# Patient Record
Sex: Female | Born: 1945 | Race: Black or African American | Hispanic: No | Marital: Single | Smoking: Never smoker
Health system: Southern US, Community
[De-identification: ages and names within clinical notes are randomized; demographics above are authoritative.]

## PROBLEM LIST (undated history)

## (undated) ENCOUNTER — Emergency Department (HOSPITAL_COMMUNITY): Admission: EM | Payer: Medicare HMO | Source: Home / Self Care

## (undated) DIAGNOSIS — E785 Hyperlipidemia, unspecified: Secondary | ICD-10-CM

## (undated) DIAGNOSIS — I10 Essential (primary) hypertension: Secondary | ICD-10-CM

## (undated) DIAGNOSIS — G4733 Obstructive sleep apnea (adult) (pediatric): Secondary | ICD-10-CM

## (undated) DIAGNOSIS — K219 Gastro-esophageal reflux disease without esophagitis: Secondary | ICD-10-CM

## (undated) DIAGNOSIS — E119 Type 2 diabetes mellitus without complications: Secondary | ICD-10-CM

## (undated) DIAGNOSIS — G4730 Sleep apnea, unspecified: Secondary | ICD-10-CM

## (undated) HISTORY — DX: Essential (primary) hypertension: I10

## (undated) HISTORY — PX: CHOLECYSTECTOMY: SHX55

## (undated) HISTORY — PX: APPENDECTOMY: SHX54

## (undated) HISTORY — DX: Sleep apnea, unspecified: G47.30

## (undated) HISTORY — DX: Type 2 diabetes mellitus without complications: E11.9

## (undated) HISTORY — PX: CATARACT EXTRACTION: SUR2

## (undated) HISTORY — PX: POLYPECTOMY: SHX149

## (undated) HISTORY — DX: Gastro-esophageal reflux disease without esophagitis: K21.9

## (undated) HISTORY — DX: Obstructive sleep apnea (adult) (pediatric): G47.33

## (undated) HISTORY — PX: BREAST EXCISIONAL BIOPSY: SUR124

## (undated) HISTORY — DX: Hyperlipidemia, unspecified: E78.5

---

## 1989-04-30 HISTORY — PX: PARTIAL HYSTERECTOMY: SHX80

## 1997-11-29 ENCOUNTER — Ambulatory Visit (HOSPITAL_COMMUNITY): Admission: RE | Admit: 1997-11-29 | Discharge: 1997-11-29 | Payer: Self-pay | Admitting: Obstetrics & Gynecology

## 1998-09-16 ENCOUNTER — Other Ambulatory Visit: Admission: RE | Admit: 1998-09-16 | Discharge: 1998-09-16 | Payer: Self-pay | Admitting: Obstetrics & Gynecology

## 1999-01-06 ENCOUNTER — Ambulatory Visit (HOSPITAL_COMMUNITY): Admission: RE | Admit: 1999-01-06 | Discharge: 1999-01-06 | Payer: Self-pay | Admitting: Obstetrics & Gynecology

## 1999-01-06 ENCOUNTER — Encounter: Payer: Self-pay | Admitting: Obstetrics & Gynecology

## 1999-08-28 ENCOUNTER — Encounter: Payer: Self-pay | Admitting: Emergency Medicine

## 1999-08-28 ENCOUNTER — Encounter: Admission: RE | Admit: 1999-08-28 | Discharge: 1999-08-28 | Payer: Self-pay | Admitting: Emergency Medicine

## 1999-12-01 ENCOUNTER — Ambulatory Visit (HOSPITAL_COMMUNITY): Admission: RE | Admit: 1999-12-01 | Discharge: 1999-12-02 | Payer: Self-pay

## 1999-12-01 ENCOUNTER — Encounter (INDEPENDENT_AMBULATORY_CARE_PROVIDER_SITE_OTHER): Payer: Self-pay | Admitting: Specialist

## 2000-01-15 ENCOUNTER — Encounter: Payer: Self-pay | Admitting: Obstetrics and Gynecology

## 2000-01-15 ENCOUNTER — Ambulatory Visit (HOSPITAL_COMMUNITY): Admission: RE | Admit: 2000-01-15 | Discharge: 2000-01-15 | Payer: Self-pay | Admitting: Obstetrics and Gynecology

## 2001-01-14 ENCOUNTER — Ambulatory Visit (HOSPITAL_COMMUNITY): Admission: RE | Admit: 2001-01-14 | Discharge: 2001-01-14 | Payer: Self-pay | Admitting: Obstetrics and Gynecology

## 2001-01-14 ENCOUNTER — Encounter: Payer: Self-pay | Admitting: Obstetrics and Gynecology

## 2002-03-19 ENCOUNTER — Encounter: Payer: Self-pay | Admitting: Obstetrics and Gynecology

## 2002-03-19 ENCOUNTER — Ambulatory Visit (HOSPITAL_COMMUNITY): Admission: RE | Admit: 2002-03-19 | Discharge: 2002-03-19 | Payer: Self-pay | Admitting: Obstetrics and Gynecology

## 2002-12-29 ENCOUNTER — Encounter: Payer: Self-pay | Admitting: Obstetrics and Gynecology

## 2002-12-29 ENCOUNTER — Ambulatory Visit (HOSPITAL_COMMUNITY): Admission: RE | Admit: 2002-12-29 | Discharge: 2002-12-29 | Payer: Self-pay | Admitting: Obstetrics and Gynecology

## 2004-01-05 ENCOUNTER — Ambulatory Visit: Payer: Self-pay | Admitting: *Deleted

## 2004-01-05 ENCOUNTER — Ambulatory Visit (HOSPITAL_COMMUNITY): Admission: RE | Admit: 2004-01-05 | Discharge: 2004-01-05 | Payer: Self-pay | Admitting: Obstetrics and Gynecology

## 2004-02-08 ENCOUNTER — Ambulatory Visit: Payer: Self-pay | Admitting: Nurse Practitioner

## 2004-02-28 ENCOUNTER — Ambulatory Visit: Payer: Self-pay | Admitting: Nurse Practitioner

## 2004-05-17 ENCOUNTER — Ambulatory Visit: Payer: Self-pay | Admitting: Family Medicine

## 2004-06-07 ENCOUNTER — Ambulatory Visit: Payer: Self-pay | Admitting: Nurse Practitioner

## 2004-08-21 ENCOUNTER — Ambulatory Visit: Payer: Self-pay | Admitting: Internal Medicine

## 2004-10-16 ENCOUNTER — Ambulatory Visit: Payer: Self-pay | Admitting: Internal Medicine

## 2004-11-15 ENCOUNTER — Ambulatory Visit: Payer: Self-pay | Admitting: Internal Medicine

## 2005-02-08 ENCOUNTER — Ambulatory Visit (HOSPITAL_COMMUNITY): Admission: RE | Admit: 2005-02-08 | Discharge: 2005-02-08 | Payer: Self-pay | Admitting: Obstetrics and Gynecology

## 2005-02-28 ENCOUNTER — Ambulatory Visit: Payer: Self-pay | Admitting: Internal Medicine

## 2005-03-07 ENCOUNTER — Ambulatory Visit: Payer: Self-pay | Admitting: Internal Medicine

## 2005-05-02 ENCOUNTER — Ambulatory Visit: Payer: Self-pay | Admitting: Family Medicine

## 2005-05-06 ENCOUNTER — Encounter (INDEPENDENT_AMBULATORY_CARE_PROVIDER_SITE_OTHER): Payer: Self-pay | Admitting: *Deleted

## 2005-05-18 ENCOUNTER — Ambulatory Visit: Payer: Self-pay | Admitting: Family Medicine

## 2005-07-12 ENCOUNTER — Ambulatory Visit: Payer: Self-pay | Admitting: Family Medicine

## 2005-10-01 ENCOUNTER — Ambulatory Visit: Payer: Self-pay | Admitting: Family Medicine

## 2005-12-21 ENCOUNTER — Ambulatory Visit: Payer: Self-pay | Admitting: Internal Medicine

## 2006-01-04 ENCOUNTER — Ambulatory Visit: Payer: Self-pay | Admitting: Internal Medicine

## 2006-01-18 ENCOUNTER — Ambulatory Visit: Payer: Self-pay | Admitting: Internal Medicine

## 2006-03-13 ENCOUNTER — Ambulatory Visit (HOSPITAL_COMMUNITY): Admission: RE | Admit: 2006-03-13 | Discharge: 2006-03-13 | Payer: Self-pay | Admitting: Internal Medicine

## 2006-05-28 ENCOUNTER — Ambulatory Visit: Payer: Self-pay | Admitting: Internal Medicine

## 2006-06-27 DIAGNOSIS — F411 Generalized anxiety disorder: Secondary | ICD-10-CM

## 2006-06-27 DIAGNOSIS — M6740 Ganglion, unspecified site: Secondary | ICD-10-CM | POA: Insufficient documentation

## 2006-06-27 DIAGNOSIS — I10 Essential (primary) hypertension: Secondary | ICD-10-CM

## 2006-06-27 DIAGNOSIS — F339 Major depressive disorder, recurrent, unspecified: Secondary | ICD-10-CM | POA: Insufficient documentation

## 2006-06-28 ENCOUNTER — Encounter (INDEPENDENT_AMBULATORY_CARE_PROVIDER_SITE_OTHER): Payer: Self-pay | Admitting: *Deleted

## 2006-06-28 ENCOUNTER — Ambulatory Visit: Payer: Self-pay | Admitting: Internal Medicine

## 2006-10-10 ENCOUNTER — Ambulatory Visit: Payer: Self-pay | Admitting: Internal Medicine

## 2006-10-22 ENCOUNTER — Ambulatory Visit: Payer: Self-pay | Admitting: Internal Medicine

## 2006-10-22 DIAGNOSIS — R634 Abnormal weight loss: Secondary | ICD-10-CM

## 2006-11-06 ENCOUNTER — Ambulatory Visit: Payer: Self-pay | Admitting: Internal Medicine

## 2006-11-06 LAB — CONVERTED CEMR LAB
Glucose, Bld: 140 mg/dL
Hgb A1c MFr Bld: 7.2 %

## 2006-11-15 ENCOUNTER — Ambulatory Visit: Payer: Self-pay | Admitting: Internal Medicine

## 2006-11-15 DIAGNOSIS — E119 Type 2 diabetes mellitus without complications: Secondary | ICD-10-CM | POA: Insufficient documentation

## 2006-11-28 ENCOUNTER — Ambulatory Visit: Payer: Self-pay | Admitting: Internal Medicine

## 2006-12-04 ENCOUNTER — Ambulatory Visit: Payer: Self-pay | Admitting: Internal Medicine

## 2006-12-10 ENCOUNTER — Ambulatory Visit: Payer: Self-pay | Admitting: Family Medicine

## 2006-12-23 ENCOUNTER — Encounter (INDEPENDENT_AMBULATORY_CARE_PROVIDER_SITE_OTHER): Payer: Self-pay | Admitting: Internal Medicine

## 2006-12-27 ENCOUNTER — Ambulatory Visit: Payer: Self-pay | Admitting: Internal Medicine

## 2006-12-27 DIAGNOSIS — K5909 Other constipation: Secondary | ICD-10-CM

## 2006-12-27 DIAGNOSIS — N951 Menopausal and female climacteric states: Secondary | ICD-10-CM

## 2006-12-27 DIAGNOSIS — N3946 Mixed incontinence: Secondary | ICD-10-CM | POA: Insufficient documentation

## 2006-12-27 DIAGNOSIS — J309 Allergic rhinitis, unspecified: Secondary | ICD-10-CM | POA: Insufficient documentation

## 2006-12-27 LAB — CONVERTED CEMR LAB
Bilirubin Urine: NEGATIVE
Blood Glucose, Fingerstick: 79
Blood in Urine, dipstick: NEGATIVE
Glucose, Urine, Semiquant: NEGATIVE
Ketones, urine, test strip: NEGATIVE
Nitrite: NEGATIVE
Protein, U semiquant: NEGATIVE
Specific Gravity, Urine: <1.005
Urobilinogen, UA: NEGATIVE
WBC Urine, dipstick: NEGATIVE
pH: 6

## 2007-01-07 ENCOUNTER — Telehealth (INDEPENDENT_AMBULATORY_CARE_PROVIDER_SITE_OTHER): Payer: Self-pay | Admitting: Internal Medicine

## 2007-01-15 ENCOUNTER — Encounter (INDEPENDENT_AMBULATORY_CARE_PROVIDER_SITE_OTHER): Payer: Self-pay | Admitting: *Deleted

## 2007-01-21 ENCOUNTER — Telehealth (INDEPENDENT_AMBULATORY_CARE_PROVIDER_SITE_OTHER): Payer: Self-pay | Admitting: Internal Medicine

## 2007-01-31 ENCOUNTER — Telehealth (INDEPENDENT_AMBULATORY_CARE_PROVIDER_SITE_OTHER): Payer: Self-pay | Admitting: Internal Medicine

## 2007-02-19 ENCOUNTER — Telehealth (INDEPENDENT_AMBULATORY_CARE_PROVIDER_SITE_OTHER): Payer: Self-pay | Admitting: *Deleted

## 2007-02-28 ENCOUNTER — Ambulatory Visit (HOSPITAL_COMMUNITY): Admission: RE | Admit: 2007-02-28 | Discharge: 2007-02-28 | Payer: Self-pay | Admitting: Internal Medicine

## 2007-03-06 ENCOUNTER — Ambulatory Visit: Payer: Self-pay | Admitting: Internal Medicine

## 2007-03-06 DIAGNOSIS — E7800 Pure hypercholesterolemia, unspecified: Secondary | ICD-10-CM

## 2007-03-13 ENCOUNTER — Ambulatory Visit: Payer: Self-pay | Admitting: Internal Medicine

## 2007-03-13 LAB — CONVERTED CEMR LAB
Alkaline Phosphatase: 52 units/L (ref 39–117)
BUN: 19 mg/dL (ref 6–23)
Creatinine, Ser: 0.85 mg/dL (ref 0.40–1.20)
Eosinophils Absolute: 0 10*3/uL (ref 0.0–0.7)
Eosinophils Relative: 1 % (ref 0–5)
Glucose, Bld: 58 mg/dL — ABNORMAL LOW (ref 70–99)
HCT: 38.1 % (ref 36.0–46.0)
HDL: 76 mg/dL (ref >39)
LDL Cholesterol: 213 mg/dL — ABNORMAL HIGH (ref 0–99)
Lymphs Abs: 2.6 10*3/uL (ref 0.7–3.3)
MCV: 93.2 fL (ref 78.0–100.0)
Monocytes Absolute: 0.4 10*3/uL (ref 0.2–0.7)
Monocytes Relative: 8 % (ref 3–11)
Platelets: 174 10*3/uL (ref 150–400)
Sodium: 139 meq/L (ref 135–145)
Total Bilirubin: 0.6 mg/dL (ref 0.3–1.2)
Total CHOL/HDL Ratio: 4
Triglycerides: 92 mg/dL (ref <150)
VLDL: 18 mg/dL (ref 0–40)
WBC: 5.3 10*3/uL (ref 4.0–10.5)

## 2007-03-19 ENCOUNTER — Encounter (INDEPENDENT_AMBULATORY_CARE_PROVIDER_SITE_OTHER): Payer: Self-pay | Admitting: Internal Medicine

## 2007-03-19 LAB — CONVERTED CEMR LAB: IgM, Serum: 95 mg/dL (ref 60–263)

## 2007-04-08 ENCOUNTER — Telehealth (INDEPENDENT_AMBULATORY_CARE_PROVIDER_SITE_OTHER): Payer: Self-pay | Admitting: *Deleted

## 2007-04-09 ENCOUNTER — Encounter (INDEPENDENT_AMBULATORY_CARE_PROVIDER_SITE_OTHER): Payer: Self-pay | Admitting: Internal Medicine

## 2007-04-09 ENCOUNTER — Ambulatory Visit: Payer: Self-pay | Admitting: Gastroenterology

## 2007-04-15 ENCOUNTER — Ambulatory Visit: Payer: Self-pay | Admitting: Internal Medicine

## 2007-04-15 ENCOUNTER — Telehealth (INDEPENDENT_AMBULATORY_CARE_PROVIDER_SITE_OTHER): Payer: Self-pay | Admitting: Internal Medicine

## 2007-04-17 ENCOUNTER — Encounter (INDEPENDENT_AMBULATORY_CARE_PROVIDER_SITE_OTHER): Payer: Self-pay | Admitting: Internal Medicine

## 2007-04-23 LAB — CONVERTED CEMR LAB
Cholesterol: 166 mg/dL (ref 0–200)
Total CHOL/HDL Ratio: 2.3
Triglycerides: 46 mg/dL (ref <150)
VLDL: 9 mg/dL (ref 0–40)

## 2007-05-06 ENCOUNTER — Ambulatory Visit: Payer: Self-pay | Admitting: Internal Medicine

## 2007-05-06 DIAGNOSIS — R5383 Other fatigue: Secondary | ICD-10-CM | POA: Insufficient documentation

## 2007-05-06 DIAGNOSIS — R5381 Other malaise: Secondary | ICD-10-CM

## 2007-05-06 LAB — CONVERTED CEMR LAB: Blood Glucose, Fingerstick: 75

## 2007-05-09 ENCOUNTER — Encounter: Payer: Self-pay | Admitting: Gastroenterology

## 2007-05-09 ENCOUNTER — Ambulatory Visit: Payer: Self-pay | Admitting: Gastroenterology

## 2007-05-09 ENCOUNTER — Encounter (INDEPENDENT_AMBULATORY_CARE_PROVIDER_SITE_OTHER): Payer: Self-pay | Admitting: Internal Medicine

## 2007-05-25 ENCOUNTER — Encounter (INDEPENDENT_AMBULATORY_CARE_PROVIDER_SITE_OTHER): Payer: Self-pay | Admitting: Internal Medicine

## 2007-06-02 ENCOUNTER — Ambulatory Visit: Payer: Self-pay | Admitting: Gastroenterology

## 2007-06-02 ENCOUNTER — Encounter (INDEPENDENT_AMBULATORY_CARE_PROVIDER_SITE_OTHER): Payer: Self-pay | Admitting: Internal Medicine

## 2007-06-03 ENCOUNTER — Telehealth (INDEPENDENT_AMBULATORY_CARE_PROVIDER_SITE_OTHER): Payer: Self-pay | Admitting: Internal Medicine

## 2007-06-17 ENCOUNTER — Encounter: Admission: RE | Admit: 2007-06-17 | Discharge: 2007-09-15 | Payer: Self-pay | Admitting: Gastroenterology

## 2007-08-28 ENCOUNTER — Ambulatory Visit: Payer: Self-pay | Admitting: Internal Medicine

## 2007-09-01 ENCOUNTER — Ambulatory Visit: Payer: Self-pay | Admitting: Gastroenterology

## 2007-09-01 DIAGNOSIS — Z860100 Personal history of colon polyps, unspecified: Secondary | ICD-10-CM | POA: Insufficient documentation

## 2007-09-01 DIAGNOSIS — K219 Gastro-esophageal reflux disease without esophagitis: Secondary | ICD-10-CM | POA: Insufficient documentation

## 2007-09-02 ENCOUNTER — Telehealth: Payer: Self-pay | Admitting: Gastroenterology

## 2007-09-03 ENCOUNTER — Encounter: Admission: RE | Admit: 2007-09-03 | Discharge: 2007-12-02 | Payer: Self-pay | Admitting: Gastroenterology

## 2007-09-15 ENCOUNTER — Ambulatory Visit: Payer: Self-pay | Admitting: Pulmonary Disease

## 2007-10-07 ENCOUNTER — Ambulatory Visit: Payer: Self-pay | Admitting: Pulmonary Disease

## 2007-11-18 ENCOUNTER — Ambulatory Visit: Payer: Self-pay | Admitting: Internal Medicine

## 2007-11-25 ENCOUNTER — Telehealth (INDEPENDENT_AMBULATORY_CARE_PROVIDER_SITE_OTHER): Payer: Self-pay | Admitting: *Deleted

## 2007-12-09 ENCOUNTER — Encounter (INDEPENDENT_AMBULATORY_CARE_PROVIDER_SITE_OTHER): Payer: Self-pay | Admitting: *Deleted

## 2007-12-12 ENCOUNTER — Telehealth (INDEPENDENT_AMBULATORY_CARE_PROVIDER_SITE_OTHER): Payer: Self-pay | Admitting: Internal Medicine

## 2007-12-24 ENCOUNTER — Encounter: Admission: RE | Admit: 2007-12-24 | Discharge: 2007-12-24 | Payer: Self-pay | Admitting: Gastroenterology

## 2008-01-15 ENCOUNTER — Ambulatory Visit: Payer: Self-pay | Admitting: Internal Medicine

## 2008-01-15 LAB — CONVERTED CEMR LAB
ALT: 35 units/L (ref 0–35)
AST: 31 units/L (ref 0–37)
Albumin: 5 g/dL (ref 3.5–5.2)
Alkaline Phosphatase: 71 units/L (ref 39–117)
Basophils Absolute: 0 10*3/uL (ref 0.0–0.1)
Basophils Relative: 1 % (ref 0–1)
Blood Glucose, Fingerstick: 91
Chlamydia, DNA Probe: NEGATIVE
Eosinophils Absolute: 0 10*3/uL (ref 0.0–0.7)
Eosinophils Relative: 1 % (ref 0–5)
Glucose, Bld: 74 mg/dL (ref 70–99)
Glucose, Urine, Semiquant: NEGATIVE
HCT: 40 % (ref 36.0–46.0)
Hgb A1c MFr Bld: 6.7 %
Ketones, urine, test strip: NEGATIVE
LDL Cholesterol: 93 mg/dL (ref 0–99)
Lymphs Abs: 2.7 10*3/uL (ref 0.7–4.0)
MCV: 93.2 fL (ref 78.0–100.0)
Microalb, Ur: 0.33 mg/dL (ref 0.00–1.89)
Neutrophils Relative %: 45 % (ref 43–77)
Platelets: 165 10*3/uL (ref 150–400)
Potassium: 3.6 meq/L (ref 3.5–5.3)
RDW: 12.7 % (ref 11.5–15.5)
Sodium: 137 meq/L (ref 135–145)
Specific Gravity, Urine: <1.005
Total Bilirubin: 0.5 mg/dL (ref 0.3–1.2)
Total Protein: 8.6 g/dL — ABNORMAL HIGH (ref 6.0–8.3)
Triglycerides: 46 mg/dL (ref <150)
VLDL: 9 mg/dL (ref 0–40)
WBC: 5.8 10*3/uL (ref 4.0–10.5)
pH: 5

## 2008-01-21 ENCOUNTER — Telehealth (INDEPENDENT_AMBULATORY_CARE_PROVIDER_SITE_OTHER): Payer: Self-pay | Admitting: Internal Medicine

## 2008-01-22 ENCOUNTER — Encounter (INDEPENDENT_AMBULATORY_CARE_PROVIDER_SITE_OTHER): Payer: Self-pay | Admitting: Internal Medicine

## 2008-02-11 ENCOUNTER — Telehealth (INDEPENDENT_AMBULATORY_CARE_PROVIDER_SITE_OTHER): Payer: Self-pay | Admitting: Internal Medicine

## 2008-02-11 ENCOUNTER — Encounter (INDEPENDENT_AMBULATORY_CARE_PROVIDER_SITE_OTHER): Payer: Self-pay | Admitting: Internal Medicine

## 2008-03-03 ENCOUNTER — Ambulatory Visit (HOSPITAL_COMMUNITY): Admission: RE | Admit: 2008-03-03 | Discharge: 2008-03-03 | Payer: Self-pay | Admitting: Internal Medicine

## 2008-03-17 ENCOUNTER — Encounter: Admission: RE | Admit: 2008-03-17 | Discharge: 2008-03-17 | Payer: Self-pay | Admitting: Gastroenterology

## 2008-03-29 ENCOUNTER — Ambulatory Visit: Payer: Self-pay | Admitting: Internal Medicine

## 2008-03-29 ENCOUNTER — Encounter (INDEPENDENT_AMBULATORY_CARE_PROVIDER_SITE_OTHER): Payer: Self-pay | Admitting: Internal Medicine

## 2008-04-12 ENCOUNTER — Telehealth (INDEPENDENT_AMBULATORY_CARE_PROVIDER_SITE_OTHER): Payer: Self-pay | Admitting: Internal Medicine

## 2008-04-15 ENCOUNTER — Telehealth (INDEPENDENT_AMBULATORY_CARE_PROVIDER_SITE_OTHER): Payer: Self-pay | Admitting: Internal Medicine

## 2008-04-21 ENCOUNTER — Ambulatory Visit: Payer: Self-pay | Admitting: Internal Medicine

## 2008-04-21 LAB — CONVERTED CEMR LAB
Bilirubin Urine: NEGATIVE
Glucose, Urine, Semiquant: NEGATIVE
Protein, U semiquant: NEGATIVE
Specific Gravity, Urine: <1.005
pH: 5

## 2008-04-22 ENCOUNTER — Encounter (INDEPENDENT_AMBULATORY_CARE_PROVIDER_SITE_OTHER): Payer: Self-pay | Admitting: Internal Medicine

## 2008-04-28 ENCOUNTER — Ambulatory Visit: Payer: Self-pay | Admitting: Internal Medicine

## 2008-05-03 ENCOUNTER — Encounter (INDEPENDENT_AMBULATORY_CARE_PROVIDER_SITE_OTHER): Payer: Self-pay | Admitting: Internal Medicine

## 2008-05-21 ENCOUNTER — Telehealth: Payer: Self-pay | Admitting: Gastroenterology

## 2008-05-21 ENCOUNTER — Encounter: Payer: Self-pay | Admitting: Gastroenterology

## 2008-05-23 ENCOUNTER — Encounter (INDEPENDENT_AMBULATORY_CARE_PROVIDER_SITE_OTHER): Payer: Self-pay | Admitting: Internal Medicine

## 2008-05-23 LAB — CONVERTED CEMR LAB
HDL: 79 mg/dL (ref >39)
LDL Cholesterol: 91 mg/dL (ref 0–99)
Triglycerides: 52 mg/dL (ref <150)

## 2008-06-14 ENCOUNTER — Telehealth (INDEPENDENT_AMBULATORY_CARE_PROVIDER_SITE_OTHER): Payer: Self-pay | Admitting: Internal Medicine

## 2008-06-14 ENCOUNTER — Encounter (INDEPENDENT_AMBULATORY_CARE_PROVIDER_SITE_OTHER): Payer: Self-pay | Admitting: Internal Medicine

## 2008-06-17 ENCOUNTER — Telehealth: Payer: Self-pay | Admitting: Gastroenterology

## 2008-06-30 ENCOUNTER — Encounter: Admission: RE | Admit: 2008-06-30 | Discharge: 2008-06-30 | Payer: Self-pay | Admitting: Gastroenterology

## 2008-07-07 ENCOUNTER — Ambulatory Visit: Payer: Self-pay | Admitting: Internal Medicine

## 2008-07-07 LAB — CONVERTED CEMR LAB
ALT: 17 units/L (ref 0–35)
AST: 19 units/L (ref 0–37)
Albumin: 4.4 g/dL (ref 3.5–5.2)
HDL: 70 mg/dL (ref >39)
Total Bilirubin: 0.5 mg/dL (ref 0.3–1.2)
Total CHOL/HDL Ratio: 2.1
Total CK: 60 units/L (ref 7–177)
VLDL: 7 mg/dL (ref 0–40)

## 2008-07-09 ENCOUNTER — Encounter (INDEPENDENT_AMBULATORY_CARE_PROVIDER_SITE_OTHER): Payer: Self-pay | Admitting: *Deleted

## 2008-07-22 ENCOUNTER — Telehealth: Payer: Self-pay | Admitting: Gastroenterology

## 2008-07-26 ENCOUNTER — Encounter (INDEPENDENT_AMBULATORY_CARE_PROVIDER_SITE_OTHER): Payer: Self-pay | Admitting: Internal Medicine

## 2008-08-02 ENCOUNTER — Encounter (INDEPENDENT_AMBULATORY_CARE_PROVIDER_SITE_OTHER): Payer: Self-pay | Admitting: Internal Medicine

## 2008-08-23 ENCOUNTER — Ambulatory Visit: Payer: Self-pay | Admitting: Internal Medicine

## 2008-08-23 ENCOUNTER — Encounter (INDEPENDENT_AMBULATORY_CARE_PROVIDER_SITE_OTHER): Payer: Self-pay | Admitting: Internal Medicine

## 2008-09-02 ENCOUNTER — Telehealth (INDEPENDENT_AMBULATORY_CARE_PROVIDER_SITE_OTHER): Payer: Self-pay | Admitting: Internal Medicine

## 2008-09-06 ENCOUNTER — Ambulatory Visit: Payer: Self-pay | Admitting: Gastroenterology

## 2008-10-05 ENCOUNTER — Ambulatory Visit: Payer: Self-pay | Admitting: Internal Medicine

## 2008-10-05 LAB — CONVERTED CEMR LAB: Blood Glucose, Fingerstick: 65

## 2008-10-11 ENCOUNTER — Telehealth (INDEPENDENT_AMBULATORY_CARE_PROVIDER_SITE_OTHER): Payer: Self-pay | Admitting: Internal Medicine

## 2008-10-11 ENCOUNTER — Ambulatory Visit (HOSPITAL_COMMUNITY): Admission: RE | Admit: 2008-10-11 | Discharge: 2008-10-11 | Payer: Self-pay | Admitting: Internal Medicine

## 2008-10-12 ENCOUNTER — Encounter: Admission: RE | Admit: 2008-10-12 | Discharge: 2008-10-12 | Payer: Self-pay | Admitting: Gastroenterology

## 2008-10-13 ENCOUNTER — Telehealth: Payer: Self-pay | Admitting: Gastroenterology

## 2008-10-19 ENCOUNTER — Telehealth: Payer: Self-pay | Admitting: Gastroenterology

## 2008-11-15 ENCOUNTER — Ambulatory Visit: Payer: Self-pay | Admitting: Gastroenterology

## 2008-11-17 ENCOUNTER — Telehealth: Payer: Self-pay | Admitting: Gastroenterology

## 2008-11-22 ENCOUNTER — Telehealth (INDEPENDENT_AMBULATORY_CARE_PROVIDER_SITE_OTHER): Payer: Self-pay | Admitting: *Deleted

## 2009-01-07 ENCOUNTER — Ambulatory Visit: Payer: Self-pay | Admitting: Internal Medicine

## 2009-01-07 DIAGNOSIS — R0989 Other specified symptoms and signs involving the circulatory and respiratory systems: Secondary | ICD-10-CM

## 2009-01-07 LAB — CONVERTED CEMR LAB
BUN: 20 mg/dL (ref 6–23)
Calcium: 10 mg/dL (ref 8.4–10.5)
Glucose, Bld: 86 mg/dL (ref 70–99)
Microalb, Ur: 0.69 mg/dL (ref 0.00–1.89)
Sodium: 140 meq/L (ref 135–145)

## 2009-01-11 ENCOUNTER — Encounter (INDEPENDENT_AMBULATORY_CARE_PROVIDER_SITE_OTHER): Payer: Self-pay | Admitting: Internal Medicine

## 2009-01-12 ENCOUNTER — Encounter: Admission: RE | Admit: 2009-01-12 | Discharge: 2009-01-12 | Payer: Self-pay | Admitting: Gastroenterology

## 2009-01-18 ENCOUNTER — Encounter (INDEPENDENT_AMBULATORY_CARE_PROVIDER_SITE_OTHER): Payer: Self-pay | Admitting: Internal Medicine

## 2009-01-18 ENCOUNTER — Ambulatory Visit: Payer: Self-pay | Admitting: Surgery

## 2009-01-18 ENCOUNTER — Ambulatory Visit (HOSPITAL_COMMUNITY): Admission: RE | Admit: 2009-01-18 | Discharge: 2009-01-18 | Payer: Self-pay | Admitting: Internal Medicine

## 2009-01-23 ENCOUNTER — Encounter (INDEPENDENT_AMBULATORY_CARE_PROVIDER_SITE_OTHER): Payer: Self-pay | Admitting: Internal Medicine

## 2009-01-24 ENCOUNTER — Ambulatory Visit: Payer: Self-pay | Admitting: Family Medicine

## 2009-01-24 ENCOUNTER — Encounter (INDEPENDENT_AMBULATORY_CARE_PROVIDER_SITE_OTHER): Payer: Self-pay | Admitting: Internal Medicine

## 2009-01-28 ENCOUNTER — Telehealth: Payer: Self-pay | Admitting: Gastroenterology

## 2009-02-02 ENCOUNTER — Ambulatory Visit: Payer: Self-pay | Admitting: Gastroenterology

## 2009-02-04 ENCOUNTER — Telehealth: Payer: Self-pay | Admitting: Gastroenterology

## 2009-02-07 ENCOUNTER — Telehealth: Payer: Self-pay | Admitting: Gastroenterology

## 2009-02-08 ENCOUNTER — Telehealth: Payer: Self-pay | Admitting: Gastroenterology

## 2009-02-09 ENCOUNTER — Encounter: Payer: Self-pay | Admitting: Gastroenterology

## 2009-02-09 ENCOUNTER — Ambulatory Visit (HOSPITAL_COMMUNITY): Admission: RE | Admit: 2009-02-09 | Discharge: 2009-02-09 | Payer: Self-pay | Admitting: Gastroenterology

## 2009-02-11 ENCOUNTER — Telehealth: Payer: Self-pay | Admitting: Gastroenterology

## 2009-02-15 ENCOUNTER — Encounter (INDEPENDENT_AMBULATORY_CARE_PROVIDER_SITE_OTHER): Payer: Self-pay | Admitting: Internal Medicine

## 2009-02-22 ENCOUNTER — Ambulatory Visit: Payer: Self-pay | Admitting: Gastroenterology

## 2009-02-28 ENCOUNTER — Ambulatory Visit: Payer: Self-pay | Admitting: Internal Medicine

## 2009-03-11 ENCOUNTER — Ambulatory Visit (HOSPITAL_COMMUNITY): Admission: RE | Admit: 2009-03-11 | Discharge: 2009-03-11 | Payer: Self-pay | Admitting: Obstetrics and Gynecology

## 2009-03-11 ENCOUNTER — Encounter (INDEPENDENT_AMBULATORY_CARE_PROVIDER_SITE_OTHER): Payer: Self-pay | Admitting: Internal Medicine

## 2009-03-22 ENCOUNTER — Encounter (INDEPENDENT_AMBULATORY_CARE_PROVIDER_SITE_OTHER): Payer: Self-pay | Admitting: Internal Medicine

## 2009-03-31 ENCOUNTER — Encounter: Admission: RE | Admit: 2009-03-31 | Discharge: 2009-03-31 | Payer: Self-pay | Admitting: Obstetrics and Gynecology

## 2009-04-30 HISTORY — PX: COLONOSCOPY: SHX174

## 2009-06-27 ENCOUNTER — Ambulatory Visit: Payer: Self-pay | Admitting: Family Medicine

## 2009-06-27 ENCOUNTER — Encounter (INDEPENDENT_AMBULATORY_CARE_PROVIDER_SITE_OTHER): Payer: Self-pay | Admitting: Internal Medicine

## 2009-07-04 ENCOUNTER — Encounter: Admission: RE | Admit: 2009-07-04 | Discharge: 2009-07-04 | Payer: Self-pay | Admitting: Gastroenterology

## 2009-07-28 ENCOUNTER — Ambulatory Visit: Payer: Self-pay | Admitting: Internal Medicine

## 2009-07-28 DIAGNOSIS — R011 Cardiac murmur, unspecified: Secondary | ICD-10-CM

## 2009-07-28 LAB — CONVERTED CEMR LAB: Blood Glucose, Fingerstick: 106

## 2009-08-11 ENCOUNTER — Ambulatory Visit: Payer: Self-pay | Admitting: Internal Medicine

## 2009-08-12 ENCOUNTER — Encounter (INDEPENDENT_AMBULATORY_CARE_PROVIDER_SITE_OTHER): Payer: Self-pay | Admitting: Internal Medicine

## 2009-08-16 LAB — CONVERTED CEMR LAB
ALT: 17 units/L (ref 0–35)
AST: 27 units/L (ref 0–37)
Alkaline Phosphatase: 63 units/L (ref 39–117)
Basophils Absolute: 0 10*3/uL (ref 0.0–0.1)
Basophils Relative: 0 % (ref 0–1)
CO2: 22 meq/L (ref 19–32)
Cholesterol: 180 mg/dL (ref 0–200)
Creatinine, Ser: 0.86 mg/dL (ref 0.40–1.20)
Eosinophils Absolute: 0 10*3/uL (ref 0.0–0.7)
LDL Cholesterol: 95 mg/dL (ref 0–99)
MCHC: 32.3 g/dL (ref 30.0–36.0)
MCV: 93.8 fL (ref 78.0–100.0)
Neutrophils Relative %: 44 % (ref 43–77)
Platelets: 159 10*3/uL (ref 150–400)
RDW: 13.1 % (ref 11.5–15.5)
Total Bilirubin: 0.4 mg/dL (ref 0.3–1.2)
Total CHOL/HDL Ratio: 2.4
VLDL: 9 mg/dL (ref 0–40)

## 2009-09-06 ENCOUNTER — Ambulatory Visit: Payer: Self-pay | Admitting: Internal Medicine

## 2009-09-06 LAB — CONVERTED CEMR LAB: Blood Glucose, Fingerstick: 71

## 2009-09-14 ENCOUNTER — Ambulatory Visit: Payer: Self-pay | Admitting: Internal Medicine

## 2009-09-14 ENCOUNTER — Encounter (INDEPENDENT_AMBULATORY_CARE_PROVIDER_SITE_OTHER): Payer: Self-pay | Admitting: Internal Medicine

## 2009-10-03 ENCOUNTER — Encounter: Admission: RE | Admit: 2009-10-03 | Discharge: 2009-10-03 | Payer: Self-pay | Admitting: Gastroenterology

## 2009-10-28 ENCOUNTER — Ambulatory Visit: Payer: Self-pay | Admitting: Internal Medicine

## 2009-11-04 ENCOUNTER — Encounter: Admission: RE | Admit: 2009-11-04 | Discharge: 2009-11-04 | Payer: Self-pay | Admitting: Obstetrics and Gynecology

## 2009-11-08 ENCOUNTER — Ambulatory Visit: Payer: Self-pay | Admitting: Internal Medicine

## 2009-11-08 DIAGNOSIS — S60459A Superficial foreign body of unspecified finger, initial encounter: Secondary | ICD-10-CM

## 2009-11-08 DIAGNOSIS — L089 Local infection of the skin and subcutaneous tissue, unspecified: Secondary | ICD-10-CM | POA: Insufficient documentation

## 2009-11-21 ENCOUNTER — Encounter (INDEPENDENT_AMBULATORY_CARE_PROVIDER_SITE_OTHER): Payer: Self-pay | Admitting: Internal Medicine

## 2009-11-21 ENCOUNTER — Ambulatory Visit: Payer: Self-pay | Admitting: Internal Medicine

## 2010-02-06 ENCOUNTER — Telehealth (INDEPENDENT_AMBULATORY_CARE_PROVIDER_SITE_OTHER): Payer: Self-pay | Admitting: Internal Medicine

## 2010-02-24 ENCOUNTER — Telehealth (INDEPENDENT_AMBULATORY_CARE_PROVIDER_SITE_OTHER): Payer: Self-pay | Admitting: Internal Medicine

## 2010-02-27 ENCOUNTER — Ambulatory Visit: Payer: Self-pay | Admitting: Internal Medicine

## 2010-03-01 ENCOUNTER — Ambulatory Visit: Payer: Self-pay | Admitting: Internal Medicine

## 2010-03-27 ENCOUNTER — Encounter (INDEPENDENT_AMBULATORY_CARE_PROVIDER_SITE_OTHER): Payer: Self-pay | Admitting: Internal Medicine

## 2010-04-07 ENCOUNTER — Ambulatory Visit (HOSPITAL_COMMUNITY)
Admission: RE | Admit: 2010-04-07 | Discharge: 2010-04-07 | Payer: Self-pay | Source: Home / Self Care | Attending: Internal Medicine | Admitting: Internal Medicine

## 2010-05-04 ENCOUNTER — Encounter (INDEPENDENT_AMBULATORY_CARE_PROVIDER_SITE_OTHER): Payer: Self-pay | Admitting: Internal Medicine

## 2010-05-04 ENCOUNTER — Ambulatory Visit
Admission: RE | Admit: 2010-05-04 | Discharge: 2010-05-04 | Payer: Self-pay | Source: Home / Self Care | Attending: Internal Medicine | Admitting: Internal Medicine

## 2010-05-21 ENCOUNTER — Encounter: Payer: Self-pay | Admitting: Obstetrics and Gynecology

## 2010-05-22 ENCOUNTER — Encounter (INDEPENDENT_AMBULATORY_CARE_PROVIDER_SITE_OTHER): Payer: Self-pay | Admitting: Internal Medicine

## 2010-05-30 ENCOUNTER — Telehealth: Payer: Self-pay | Admitting: Gastroenterology

## 2010-05-30 LAB — CONVERTED CEMR LAB: Hgb A1c MFr Bld: 7 % — ABNORMAL HIGH (ref <5.7)

## 2010-05-30 NOTE — Letter (Signed)
Summary: *HSN Results Follow up  [place], [place] [address]   Phone: [phone]  Fax: [phone]      11/21/2009   [name] [address] [place], [place]  [address]   Dear  Ms. [name],                            ____S.Drinkard,FNP   ____D. Gore,FNP       ____B. McPherson,MD   ____V. Rankins,MD    __X__E. Ricard Faulkner,MD    ____N. Hassell Done, FNP  ____D. Jobe Igo, MD    ____K. Tomma Lightning, MD    ____Other     This letter is to inform you that your recent test(s):  _______Pap Smear    ___X____Lab Test     _______X-ray    ___X____ is within acceptable limits  _______ requires a medication change  _______ requires a follow-up lab visit  _______ requires a follow-up visit with your provider   Comments:  Your A1C showed your sugar control to be better with last blood test--7.2%--that's pretty good.       _________________________________________________________ If you have any questions, please contact our office                     Sincerely,  Mack Hook MD [facility]

## 2010-05-30 NOTE — Letter (Signed)
Summary: [place]   Imported By: Roland Earl 09/20/2009 15:07:33  _____________________________________________________________________  External Attachment:    Type:   Image     Comment:   External Document

## 2010-05-30 NOTE — Letter (Signed)
Summary: PODIATRY  PODIATRY   Imported By: Roland Earl 12/14/2009 10:35:31  _____________________________________________________________________  External Attachment:    Type:   Image     Comment:   External Document

## 2010-05-30 NOTE — Assessment & Plan Note (Signed)
Summary: WANTS A1C TO BE CHECKED///KT   Vital Signs:  Patient profile:   65 year old female Menstrual status:  hysterectomy Weight:      121.25 pounds BMI:     22.99 Temp:     97.8 degrees F Pulse rate:   57 / minute Pulse rhythm:   regular Resp:     12 per minute BP sitting:   169 / 69  (right arm) Cuff size:   regular  Vitals Entered By: Oswaldo Conroy, SMA CC: Pt. is here for DM check . Pt. is also feeling some pain on the side of her left foot. Not sleeping well.  Is Patient Diabetic? Yes Did you bring your meter with you today? No Pain Assessment Patient in pain? no      CBG Result 106  Does patient need assistance? Functional Status Self care Ambulation Normal   Primary Care Provider:  Mack Hook  CC:  Pt. is here for DM check . Pt. is also feeling some pain on the side of her left foot. Not sleeping well. Marland Kitchen  History of Present Illness: 1.  DM:  just came in to have a 6 mos A1C checked.  Does not feel she has   2.  Insomnia:  6 months.  Intermittent problems with this.  Able to initiate sleep, but awakens and then difficulties getting back to sleep.  Has done foot massage and meditation and breathing exercises.  Feels that has helped.  Often getting up to use the bathroom.  The latter is a long term behavior--only once for that.  Still walking early in the morning--outside.   Sleep time and wake time are regular.  No significant caffeine.  Thinks her sleeping problem has added to her higher sugars.  Also, admits that her mattress is not comfortable.  3.  Hypertension:  was late taking bp meds today.  Rushed out of home before coming here.  4.  Blackhead on left upper arm--has not messed with it secondary to her DM.  5.  Feet cold--no definite discomfort secondary to the cold.  Current Medications (verified): 1)  Hydrochlorothiazide 25 Mg  Tabs (Hydrochlorothiazide) .... Take 1 Tablet By Mouth Once A Day 2)  Miralax  Powd (Polyethylene Glycol 3350) .Marland KitchenMarland KitchenMarland Kitchen 17  G in 8 Oz Water Daily 3)  Metformin Hcl 500 Mg  Tabs (Metformin Hcl) .... Once Daily With Meal 4)  Potassium Chloride Crys Cr 20 Meq  Tbcr (Potassium Chloride Crys Cr) .Marland Kitchen.. 1 Tablet By Mouth Daily 5)  Amlodipine Besylate 10 Mg  Tabs (Amlodipine Besylate) .Marland Kitchen.. 1 Tab By Mouth Daily 6)  Detrol La 4 Mg  Cp24 (Tolterodine Tartrate) .Marland Kitchen.. 1 Tab By Mouth Daily 7)  Lisinopril 40 Mg  Tabs (Lisinopril) .Marland Kitchen.. 1 Tab By Mouth Daily 8)  Protonix 40 Mg  Pack (Pantoprazole Sodium) .... Take 1 Tablet By Mouth Once A Day 9)  Crestor 20 Mg Tabs (Rosuvastatin Calcium) .Marland Kitchen.. 1 Tab By Mouth Daily 10)  Naproxen 500 Mg Tabs (Naproxen) .Marland Kitchen.. 1 Tab By Mouth Two Times A Day With Food 11)  Calcium/vitamin D/minerals 600-200 Mg-Unit Tabs (Calcium Carbonate-Vit D-Min) .Marland Kitchen.. 1 Tab By Mouth Two Times A Day  Allergies (verified): 1)  Sulfamethoxazole (Sulfamethoxazole) 2)  * Spices  Physical Exam  Lungs:  Normal respiratory effort, chest expands symmetrically. Lungs are clear to auscultation, no crackles or wheezes. Heart:  Normal rate and regular rhythm. S1 and S2 normal without gallop,, click, rub.  Grade II-III/VI SEM that radiates to  right second interspace and into left carotid--interestingly, do not hear in right carotid. Extremities:  Feet a bit on the cool side, but pink with good cap refill and good DP/PT pulses. Skin:  1/2 cm epidermoid cyst just above left elbow on anterior arm--gentle pressure expelled thick white discharge.  Pt. tolerated well without complication--Bacitracin and bandaid applied   Impression & Recommendations:  Problem # 1:  CARDIAC MURMUR (ICD-785.2) Hx of echo in 2000 with Dr. Minda Meo send for that.   If nothing to explain murmur, will check new echo.  Problem # 2:  DM, UNCOMPLICATED, TYPE II ([id number])  Pt. to work on lifestyle. Would like to see A1C closer to 7.0 % Her updated medication list for this problem includes:    Metformin Hcl 500 Mg Tabs (Metformin hcl) ..... Once daily  with meal    Lisinopril 40 Mg Tabs (Lisinopril) .Marland Kitchen... 1 tab by mouth daily  Orders: Hgb A1C [id number])  Problem # 3:  HYPERTENSION, BENIGN SYSTEMIC (ICD-401.1) Up today--recheck when comes in for fasting labs Her updated medication list for this problem includes:    Hydrochlorothiazide 25 Mg Tabs (Hydrochlorothiazide) .Marland Kitchen... Take 1 tablet by mouth once a day    Amlodipine Besylate 10 Mg Tabs (Amlodipine besylate) .Marland Kitchen... 1 tab by mouth daily    Lisinopril 40 Mg Tabs (Lisinopril) .Marland Kitchen... 1 tab by mouth daily  Problem # 4:  EPIDERMOID CYST, LEFT ARM (ICD-706.2) Follow--call if any inflammation.  Complete Medication List: 1)  Hydrochlorothiazide 25 Mg Tabs (Hydrochlorothiazide) .... Take 1 tablet by mouth once a day 2)  Miralax Powd (Polyethylene glycol 3350) .Marland KitchenMarland KitchenMarland Kitchen 17 g in 8 oz water daily 3)  Metformin Hcl 500 Mg Tabs (Metformin hcl) .... Once daily with meal 4)  Potassium Chloride Crys Cr 20 Meq Tbcr (Potassium chloride crys cr) .Marland Kitchen.. 1 tablet by mouth daily 5)  Amlodipine Besylate 10 Mg Tabs (Amlodipine besylate) .Marland Kitchen.. 1 tab by mouth daily 6)  Detrol La 4 Mg Cp24 (Tolterodine tartrate) .Marland Kitchen.. 1 tab by mouth daily 7)  Lisinopril 40 Mg Tabs (Lisinopril) .Marland Kitchen.. 1 tab by mouth daily 8)  Protonix 40 Mg Pack (Pantoprazole sodium) .... Take 1 tablet by mouth once a day 9)  Crestor 20 Mg Tabs (Rosuvastatin calcium) .Marland Kitchen.. 1 tab by mouth daily 10)  Naproxen 500 Mg Tabs (Naproxen) .Marland Kitchen.. 1 tab by mouth two times a day with food 11)  Calcium/vitamin D/minerals 600-200 Mg-unit Tabs (Calcium carbonate-vit d-min) .Marland Kitchen.. 1 tab by mouth two times a day  Other Orders: Capillary Blood Glucose/CBG [id number])  Patient Instructions: 1)  Release of information--Dr. Kelley--cardiology--looking for an echo report from early 2000s. 2)  Fasting labs in 2 weeks with bp check. 3)  FLP, CBC, CMET, urine microalbumin  Laboratory Results   Blood Tests     HGBA1C: 7.6%   (Normal Range: Non-Diabetic - 3-6%   Control Diabetic -  6-8%) CBG Random:: 106mg /dL      Appended Document: WANTS A1C TO BE CHECKED///KT Forgot to add Insomnia to A &P Rx for intermittent and only Rx for AmbienMedications Added AMBIEN 10 MG TABS (ZOLPIDEM TARTRATE) 1/2 to 1 tab by mouth at beditme as needed sleep          Clinical Lists Changes  Medications: Added new medication of AMBIEN 10 MG TABS (ZOLPIDEM TARTRATE) 1/2 to 1 tab by mouth at beditme as needed sleep - Signed Rx of AMBIEN 10 MG TABS (ZOLPIDEM TARTRATE) 1/2 to 1 tab by mouth at beditme as needed sleep;  #15  x 0;  Signed;  Entered by: Mack Hook MD;  Authorized by: Mack Hook MD;  Method used: Print then Give to Patient    Prescriptions: AMBIEN 10 MG TABS (ZOLPIDEM TARTRATE) 1/2 to 1 tab by mouth at beditme as needed sleep  #15 x 0   Entered and Authorized by:   Mack Hook MD   Signed by:   Mack Hook MD on 07/28/2009   Method used:   Print then Give to Patient   RxID:   [id number]

## 2010-05-30 NOTE — Letter (Signed)
Summary: PODIATRY  PODIATRY   Imported By: Roland Earl 01/31/2010 12:51:48  _____________________________________________________________________  External Attachment:    Type:   Image     Comment:   External Document

## 2010-05-30 NOTE — Progress Notes (Signed)
Summary: NEEDING REFILLS  Phone Note Call from Patient Call back at Home Phone [phone]   Reason for Call: Refill Medication Summary of Call: [facility] [place] LAST LAST "SUNDAY FOR HER REFILLS ON DETROL, CRESTOR, HCTZ, LISINOPRIL, METFORMIN, MIRALAX, POTASSIUM, AMLODIPINE AND PROTINIX, AND SHE SAYS SHE IS STILL WAITING. Initial call taken by: Kimberly Tinnin,  February 06, 2010 9:04 AM  Follow-up for Phone Call        pharmacy has sent a refill request last week pt needs add'l refill as original rx has run out pharmacy will re send Chantel Miller  February 06, 2010 3:47 PM   Additional Follow-up for Phone Call Additional follow up Details #1::        refills sent to [place] pharmacy notify pt to check from there Additional Follow-up by: Nykedtra Martin FNP,  February 06, 2010 5:22 PM    New/Updated Medications: METFORMIN HCL 500 MG  TABS (METFORMIN HCL) One tablet by mouth daily Prescriptions: POTASSIUM CHLORIDE CRYS CR 20 MEQ  TBCR (POTASSIUM CHLORIDE CRYS CR) 1 tablet by mouth daily  #30 x 3   Entered and Authorized by:   Nykedtra Martin FNP   Signed by:   Nykedtra Martin FNP on 02/06/2010   Method used:   Faxed to ...       [facility] (retail)       [address]       [place], [place]  [address]       Ph: [phone]       Fax: [phone]   RxID:   [id number] PROTONIX 40 MG  PACK (PANTOPRAZOLE SODIUM) Take 1 tablet by mouth once a day  #30 x 3   Entered and Authorized by:   Nykedtra Martin FNP   Signed by:   Nykedtra Martin FNP on 02/06/2010   Method used:   Faxed to ...       [facility] (retail)       [address]       [place], [place]  [address]       Ph: [phone]       Fax: [phone]   RxID:   [id number] AMLODIPINE BESYLATE 10 MG  TABS (AMLODIPINE BESYLATE) 1 tab by mouth daily  #30 x 3   Entered and Authorized by:   Nykedtra Martin FNP  Signed by:   Nykedtra Martin FNP on 02/06/2010   Method used:   Faxed to ...       [facility] (retail)       [address]       [place], [place]  [address]       Ph: [phone]       Fax: [phone]   RxID:   [id number] HYDROCHLOROTHIAZIDE 25 MG  TABS (HYDROCHLOROTHIAZIDE) Take 1 tablet by mouth once a day  #30 x 3   Entered and Authorized by:   Nykedtra Martin FNP   Signed by:   Nykedtra Martin FNP on 02/06/2010   Method used:   Faxed to ...       [facility] (retail)       [address], [place]  [address]       Ph: [phone]       Fax: [phone]   RxID:   [id number] CRESTOR 20 MG TABS (ROSUVASTATIN CALCIUM) 1 tab by mouth daily  #  30 x 3   Entered and Authorized by:   Aurora Mask FNP   Signed by:   Aurora Mask FNP on 02/06/2010   Method used:   Faxed to ...       [facility] (retail)       [place], [place]  [address]       Ph: [phone]       Fax: [phone]   RxID:   [id number] DETROL LA 4 MG  CP24 (TOLTERODINE TARTRATE) 1 tab by mouth daily  #30 x 3   Entered and Authorized by:   Aurora Mask FNP   Signed by:   Aurora Mask FNP on 02/06/2010   Method used:   Faxed to ...       [facility] (retail)       [place], [place]  [address]       Ph: [phone] [place]       Fax: [phone]   RxID:   [id number] LISINOPRIL 40 MG  TABS (LISINOPRIL) 1 tab by mouth daily  #30 x 3   Entered and Authorized by:   Aurora Mask FNP   Signed by:   Aurora Mask FNP on 02/06/2010   Method used:   Faxed to ...       [facility] (retail)       [place], [place]  [address]       Ph: [phone] [place]       Fax: [phone]   RxID:   [id number] METFORMIN HCL 500 MG   TABS (METFORMIN HCL) One tablet by mouth daily  #30 x 3   Entered and Authorized by:   Aurora Mask FNP   Signed by:   Aurora Mask FNP on 02/06/2010   Method used:   Faxed to ...       [facility] (retail)       [address] [place], [place]  [address]       Ph: [phone]       Fax: [phone]   RxID:   [id number]

## 2010-05-30 NOTE — Progress Notes (Signed)
Summary: NEED TO SPEAK WITH THE NURSE.  Phone Note Call from Patient Call back at [phone]   Summary of Call: PATIENT IS CALLING TO SEE IF SHE CAN GET MEDICINE SHE IS DIABETIC HER SYMMPTOMS DRY COUGH ,CHEST CONGESTION,HEADACHE,FEVER, OR IF YOU RECOMMEND SOMETHIG FOR HER. Initial call taken by: Trula Ore,  February 24, 2010 8:23 AM  Follow-up for Phone Call        PLEASE CALL [phone] Follow-up by: Trula Ore,  February 27, 2010 2:19 PM  Additional Follow-up for Phone Call Additional follow up Details #1::        Pt. states no HA, no Fever, cl nasal D/C, coughing expectorating sm amt cl mucous feels like she can not get it up. Cough worse @ night. Advised  to drink plenty of water, may try OTC Mucinex and allery med. Contact us if develops fever, mucous yellow/green or blood sugar high. She has not checked her blood sugar lately but states "I feel fine" Contact us if blood sugar greater than 200.  Additional Follow-up by: Bridgett Larsson RN,  March 02, 2010 3:32 PM

## 2010-05-30 NOTE — Assessment & Plan Note (Signed)
Summary: f/u right foot per dr Amil Amen / ns   Vital Signs:  Patient profile:   65 year old female Menstrual status:  hysterectomy Weight:      118 pounds Temp:     97.9 degrees F Pulse rate:   66 / minute Pulse rhythm:   regular Resp:     16 per minute BP sitting:   111 / 62  (right arm) Cuff size:   regular  Vitals Entered By: Shellia Carwin CMA (Sep 06, 2009 2:11 PM) CC: f/u on right foot problem, it is doing better.  Not sleeping well Is Patient Diabetic? Yes Pain Assessment Patient in pain? no      CBG Result 71  Does patient need assistance? Ambulation Normal   Primary Care Provider:  Mack Hook  CC:  f/u on right foot problem and it is doing better.  Not sleeping well.  History of Present Illness: 1.  Hyperlipidemia: LDL not quite at goal--pt. continuing on Crestor and working on diet and exercise.  2.  Insomnia:  Taking Ambien.  Not using daily.  When does use--only 3 hours of sleep.  Feels taking her meds (Crestor and Klor con)  at bedtime causes this.  Does not fall asleep watching TV now.    3.  Foot much better.  Allergies (verified): 1)  Sulfamethoxazole (Sulfamethoxazole) 2)  * Spices  Physical Exam  General:  NAD Lungs:  Normal respiratory effort, chest expands symmetrically. Lungs are clear to auscultation, no crackles or wheezes. Heart:  Normal rate and regular rhythm. S1 and S2 normal without gallop, murmur, click, rub or other extra sounds.   Msk:  Right foot with callous over 5th MTP laterally--appears friction related   Impression & Recommendations:  Problem # 1:  CARDIAC MURMUR (ICD-785.2) Not heard today--check for echo from Dr. Georgina Peer.  Problem # 2:  INSOMNIA, INTERMITTENT (ICD-780.52) Discussed sleep hygiene again Her updated medication list for this problem includes:    Ambien 10 Mg Tabs (Zolpidem tartrate) .Marland Kitchen... 1/2 to 1 tab by mouth at beditme as needed sleep  Problem # 3:  CALLUS, FOOT (ICD-700) To wear better fitting  shoes.  Complete Medication List: 1)  Hydrochlorothiazide 25 Mg Tabs (Hydrochlorothiazide) .... Take 1 tablet by mouth once a day 2)  Miralax Powd (Polyethylene glycol 3350) .Marland KitchenMarland KitchenMarland Kitchen 17 g in 8 oz water daily 3)  Metformin Hcl 500 Mg Tabs (Metformin hcl) .... Once daily with meal 4)  Potassium Chloride Crys Cr 20 Meq Tbcr (Potassium chloride crys cr) .Marland Kitchen.. 1 tablet by mouth daily 5)  Amlodipine Besylate 10 Mg Tabs (Amlodipine besylate) .Marland Kitchen.. 1 tab by mouth daily 6)  Detrol La 4 Mg Cp24 (Tolterodine tartrate) .Marland Kitchen.. 1 tab by mouth daily 7)  Lisinopril 40 Mg Tabs (Lisinopril) .Marland Kitchen.. 1 tab by mouth daily 8)  Protonix 40 Mg Pack (Pantoprazole sodium) .... Take 1 tablet by mouth once a day 9)  Crestor 20 Mg Tabs (Rosuvastatin calcium) .Marland Kitchen.. 1 tab by mouth daily 10)  Naproxen 500 Mg Tabs (Naproxen) .Marland Kitchen.. 1 tab by mouth two times a day with food 11)  Calcium/vitamin D/minerals 600-200 Mg-unit Tabs (Calcium carbonate-vit d-min) .Marland Kitchen.. 1 tab by mouth two times a day 12)  Ambien 10 Mg Tabs (Zolpidem tartrate) .... 1/2 to 1 tab by mouth at beditme as needed sleep  Other Orders: Capillary Blood Glucose/CBG [id number])  Patient Instructions: 1)  Nurse visit for A1C end of June, beginning of July 2)  Make FLP with CPP  in September--not 2  separate appts. please Prescriptions: AMBIEN 10 MG TABS (ZOLPIDEM TARTRATE) 1/2 to 1 tab by mouth at beditme as needed sleep  #15 x 0   Entered and Authorized by:   Mack Hook MD   Signed by:   Mack Hook MD on 09/06/2009   Method used:   Print then Give to Patient   RxID:   [id number]

## 2010-05-30 NOTE — Assessment & Plan Note (Signed)
Summary: FLU SHOT//MC  Nurse Visit   Allergies: 1)  Sulfamethoxazole (Sulfamethoxazole) 2)  * Spices  Immunizations Administered:  Influenza Vaccine # 1:    Vaccine Type: Fluvax 3+    Site: right deltoid    Mfr: [employer]oSmith[employer]    Dose: 0.5 ml    Route: IM    Given by: Sherian Maroon RN    Exp. Date: 10/28/2010    Lot #: [id number]    VIS given: 11/22/09 version given March 01, 2010.  Flu Vaccine Consent Questions:    Do you have a history of severe allergic reactions to this vaccine? no    Any prior history of allergic reactions to egg and/or gelatin? no    Do you have a sensitivity to the preservative Thimersol? no    Do you have a past history of Guillan-Barre Syndrome? no    Do you currently have an acute febrile illness? no    Have you ever had a severe reaction to latex? no    Vaccine information given and explained to patient? yes    Are you currently pregnant? no  Orders Added: 1)  Flu Vaccine 49yrs + [90658] 2)  Admin 1st Vaccine [id number] 3)  Est. Patient Nurse visit [id number]

## 2010-05-30 NOTE — Assessment & Plan Note (Signed)
Summary: SPLINTER ON FINGER PER NURSE / NS   Vital Signs:  Patient profile:   65 year old female Menstrual status:  hysterectomy Temp:     97.7 degrees F Pulse rate:   50 / minute Pulse rhythm:   regular Resp:     18 per minute BP sitting:   120 / 64  (left arm) Cuff size:   regular  Vitals Entered By: Shellia Carwin CMA (November 08, 2009 11:37 AM) CC: splinter in right index finger Is Patient Diabetic? Yes CBG Result 77  Does patient need assistance? Ambulation Normal   Primary Care Provider:  Mack Hook  CC:  splinter in right index finger.  History of Present Illness: Pt. with thorn or splinter in right index finger at flexure of DIP joint/palmar aspect.  Has had for a couple of days and unable to get out--painful.  Td up to date  Allergies (verified): 1)  Sulfamethoxazole (Sulfamethoxazole) 2)  * Spices  Physical Exam  Skin:  Right index finger with dark thin lesion in palmar flexure of DIP joint.   Area cleaned with alcohol 18 gauge needle used to pull roof of skin off lesion-pressure applied and a thin thorn extracted easily.  No erythema of discharge.  No bleeding. Pt. tolerated well.   Impression & Recommendations:  Problem # 1:  FINGER SUP FB WITHOUT MAJOR OPEN WOUND INF (ICD-915.7)  Thorn removed  Orders: EMR Misc Charge Code [facility])  Complete Medication List: 1)  Hydrochlorothiazide 25 Mg Tabs (Hydrochlorothiazide) .... Take 1 tablet by mouth once a day 2)  Miralax Powd (Polyethylene glycol 3350) .Marland KitchenMarland KitchenMarland Kitchen 17 g in 8 oz water daily 3)  Metformin Hcl 500 Mg Tabs (Metformin hcl) .... Once daily with meal 4)  Potassium Chloride Crys Cr 20 Meq Tbcr (Potassium chloride crys cr) .Marland Kitchen.. 1 tablet by mouth daily 5)  Amlodipine Besylate 10 Mg Tabs (Amlodipine besylate) .Marland Kitchen.. 1 tab by mouth daily 6)  Detrol La 4 Mg Cp24 (Tolterodine tartrate) .Marland Kitchen.. 1 tab by mouth daily 7)  Lisinopril 40 Mg Tabs (Lisinopril) .Marland Kitchen.. 1 tab by mouth daily 8)  Protonix 40 Mg Pack  (Pantoprazole sodium) .... Take 1 tablet by mouth once a day 9)  Crestor 20 Mg Tabs (Rosuvastatin calcium) .Marland Kitchen.. 1 tab by mouth daily 10)  Naproxen 500 Mg Tabs (Naproxen) .Marland Kitchen.. 1 tab by mouth two times a day with food 11)  Calcium/vitamin D/minerals 600-200 Mg-unit Tabs (Calcium carbonate-vit d-min) .Marland Kitchen.. 1 tab by mouth two times a day 12)  Ambien 10 Mg Tabs (Zolpidem tartrate) .... 1/2 to 1 tab by mouth at beditme as needed sleep  Other Orders: Capillary Blood Glucose/CBG [id number])

## 2010-06-01 NOTE — Letter (Signed)
Summary: PODIATRY  PODIATRY   Imported By: Roland Earl 04/11/2010 15:30:33  _____________________________________________________________________  External Attachment:    Type:   Image     Comment:   External Document

## 2010-06-01 NOTE — Letter (Signed)
Summary: *HSN Results Follow up  [facility]  [address] [place], [place] [address]   Phone: [phone]  Fax: [phone]      05/22/2010   [address] [place], [place]  [address]   Dear  Ms. Mallory Amble,                            ____S.Drinkard,FNP   ____D. Gore,FNP       ____B. McPherson,MD   ____V. Rankins,MD    _X___E. Dontrel Smethers,MD    ____N. Hassell Done, FNP  ____D. Jobe Igo, MD    ____K. Tomma Lightning, MD    ____Other     This letter is to inform you that your recent test(s):  _______Pap Smear    ___X____Lab Test     _______X-ray    _____X__ is within acceptable limits  _______ requires a medication change  _______ requires a follow-up lab visit  _______ requires a follow-up visit with your provider   Comments:  A1C is a bit better at 7.0%  Faxing to Amy at [facility]       _________________________________________________________ If you have any questions, please contact our office                     Sincerely,  Mallory Hook MD [facility]

## 2010-06-07 NOTE — Progress Notes (Signed)
Summary: Triage   Phone Note Call from Patient Call back at Home Phone [phone]   Caller: Patient Call For: Dr. Deatra Ina Reason for Call: Talk to Nurse Summary of Call: constipation 2-3 weeks...taking Miralax and it is still "slow moving" Initial call taken by: Webb Laws,  May 30, 2010 9:50 AM  Follow-up for Phone Call        Patient states that she has been eating a lot of dairy products for the past couple of weeks and has been constipated. Taking miralax but still not going like normal. States she had a soft bowel movement today. Instructed patient to cut back on the dairy products and see if this dietary change helps. Patient to call us back if no improvement. Follow-up by: Rosanne Sack RN,  May 30, 2010 12:06 PM

## 2010-06-21 ENCOUNTER — Telehealth (INDEPENDENT_AMBULATORY_CARE_PROVIDER_SITE_OTHER): Payer: Self-pay | Admitting: Internal Medicine

## 2010-06-27 NOTE — Progress Notes (Signed)
Summary: SEVERE CONSTIPATON and sleeping difficulty  Phone Note Call from Patient Call back at Home Phone [phone] Call back at [phone]   Reason for Call: Talk to Nurse Summary of Call: Mallory Hayes SAYS THAT SHE HAS SEVERE CONSTIPATION . Initial call taken by: Roberto Scales,  June 21, 2010 9:46 AM  Follow-up for Phone Call        Last BM was moderate amount, gave herself an enema, first one in a long time. Taking Miralax every day, only having small, soft stools.  Diet has not changed, trying to drink plenty of water, is only eating a few nuts and special K.  Occasionally eats fruit, not a lot.  Advised she needs to have more fiber in her diet, is starting to get more exercise, had not been more mobile due to monitoring mother during her illness.  Informed also that stressors can have an impact on GI functioning.  Would like to know if there is anything else she needs to do.  Also, she is having trouble sleeping if eat or drinks anything after 6 pm, can get to sleep without difficulty, but can't stay asleep, eyes just "pop open."   States "my mattress feels hard, because my body is so tense."  Wakes up tired.  Did not take Ambien, wanted to try sleeping without them.  Mother became seriously ill in July, sleeping started getting disrupted back home after her hospitalization.  Detrol LA was also keeping her awake, which she stopped taking about 2 weeks ago.  Is there something she can take to assist her.  She doesn't have the TV or light on in her room at night -- tired of nonrestorative sleep. Follow-up by: Sherian Maroon RN,  June 22, 2010 10:57 AM  Additional Follow-up for Phone Call Additional follow up Details #1::        I don't understand how some of her activities are disrupting her sleep, nor why she thinks the Detrol is suddenly keeping her awake.  If she doesn't take the Detrol, she may have to urinate more--not clear if that's why she isn't sleeping if  she eats or drinks late.   She has a lot of complaints--would like to have her make and appt--see if can get her in if I have a cancellation in next couple of weeks. She can increase the Miralax to two times a day for a bit to see if that helps, but does need to eat more fiber.  She could also add Metamucil or Citrucel. Additional Follow-up by: Mack Hook MD,  June 23, 2010 6:22 AM    Additional Follow-up for Phone Call Additional follow up Details #2::    Advised re provider's response and recommendations re constipation and other issues, and that she needs to call to schedule appt. to address those issues, suggested to call next week.  Verbalized understanding and agreement.  Sherian Maroon RN  June 23, 2010 5:26 PM

## 2010-06-28 ENCOUNTER — Ambulatory Visit (INDEPENDENT_AMBULATORY_CARE_PROVIDER_SITE_OTHER): Payer: Self-pay | Admitting: Obstetrics & Gynecology

## 2010-06-28 ENCOUNTER — Other Ambulatory Visit: Payer: Self-pay | Admitting: Obstetrics & Gynecology

## 2010-06-28 ENCOUNTER — Other Ambulatory Visit (HOSPITAL_COMMUNITY)
Admission: RE | Admit: 2010-06-28 | Discharge: 2010-06-28 | Disposition: A | Discharge status: Home / Self Care | Payer: Self-pay | Source: Ambulatory Visit | Attending: Obstetrics & Gynecology | Admitting: Obstetrics & Gynecology

## 2010-06-28 DIAGNOSIS — N898 Other specified noninflammatory disorders of vagina: Secondary | ICD-10-CM | POA: Insufficient documentation

## 2010-06-28 DIAGNOSIS — Z01419 Encounter for gynecological examination (general) (routine) without abnormal findings: Secondary | ICD-10-CM

## 2010-06-28 LAB — POCT URINALYSIS DIPSTICK
Bilirubin Urine: NEGATIVE
Nitrite: NEGATIVE
Urine Glucose, Fasting: NEGATIVE mg/dL
Urobilinogen, UA: 0.2 mg/dL (ref 0.0–1.0)

## 2010-06-29 ENCOUNTER — Encounter: Payer: Self-pay | Admitting: Obstetrics & Gynecology

## 2010-07-21 NOTE — Progress Notes (Signed)
NAME:  Mallory Hayes, SILLA NO.:  [id number]  MEDICAL RECORD NO.:  [id number]           PATIENT TYPE:  A  LOCATION:  [facility]                   FACILITY:  [facility]  PHYSICIAN:  Emily Filbert, MD        DATE OF BIRTH:  April 27, 1946  DATE OF SERVICE:  06/28/2010                                 CLINIC NOTE  Mallory Hayes is a 65 year old single African American, gravida 0, who comes in here for annual exam.  She has no particular GYN complaints.  Her medical problems include hyperlipidemia, hypertension, urge incontinence, reflux, and adult onset diabetes mellitus.  REVIEW OF SYSTEMS:  She does complain of insomnia.  She is currently doing yoga at home.  She is single and abstinent.  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  Her last Pap smear was in approximately 2010, her last mammogram was in October 2011, and her colonoscopy was in April 2011.  PAST SURGICAL HISTORY:  Right breast biopsy in 1974, cholecystectomy, and a vaginal hysterectomy.  FAMILY HISTORY:  Ovarian cancer - her mother was diagnosed at age 80 and is currently alive, status post chemotherapy.  She says the second cousin had breast cancer.  She denies a family history of colon cancer.  MEDICATIONS:  Crestor, Monopril, hydrochlorothiazide, MiraLax, Detrol LA, Protonix, metformin, aspirin 81 mg, lisinopril, and Norvasc.  PHYSICAL EXAMINATION:  GENERAL:  A well-nourished, well-hydrated, pleasant thin female. VITAL SIGNS:  Height about 5 feet, weight 116 pounds, blood pressure 115/60,and  pulse 58. HEENT:  Normal. HEART:  Regular rate and rhythm. LUNGS:  Clear to auscultation bilaterally. BREAST:  Normal bilaterally. ABDOMEN:  Benign. EXTERNAL GENITALIA:  Moderate atrophy. VAGINAL:  She has moderate atrophy and scattered punctate-type red lesions around her vaginal cuff and vaginal wall.  ASSESSMENT/PLAN: 1. Annual exam.  I have recommended self breast and self vulvar  exams     monthly. 2. Vaginal lesions.  I suspected this is from atrophy, but I have     biopsied one of these red lesions in the vaginal     cuff and she tolerated the procedure well.  I told her I suspected     this is entirely normal, just due to atrophy and I will call if     there is any problem otherwise, otherwise I will see her in a year     or sooner as necessary.     Emily Filbert, MD    MCD/MEDQ  D:  06/28/2010  T:  06/29/2010  Job:  [id number]

## 2010-08-03 LAB — GLUCOSE, CAPILLARY: Glucose-Capillary: 96 mg/dL (ref 70–99)

## 2010-08-13 ENCOUNTER — Other Ambulatory Visit: Payer: Self-pay | Admitting: Family Medicine

## 2010-08-13 ENCOUNTER — Encounter: Payer: Self-pay | Admitting: Family Medicine

## 2010-08-13 DIAGNOSIS — E7800 Pure hypercholesterolemia, unspecified: Secondary | ICD-10-CM

## 2010-08-13 DIAGNOSIS — K219 Gastro-esophageal reflux disease without esophagitis: Secondary | ICD-10-CM

## 2010-08-13 DIAGNOSIS — E119 Type 2 diabetes mellitus without complications: Secondary | ICD-10-CM

## 2010-08-13 DIAGNOSIS — N3946 Mixed incontinence: Secondary | ICD-10-CM

## 2010-08-13 DIAGNOSIS — K5909 Other constipation: Secondary | ICD-10-CM

## 2010-08-13 DIAGNOSIS — I10 Essential (primary) hypertension: Secondary | ICD-10-CM

## 2010-08-21 ENCOUNTER — Ambulatory Visit (INDEPENDENT_AMBULATORY_CARE_PROVIDER_SITE_OTHER): Payer: Self-pay | Admitting: Family Medicine

## 2010-08-21 ENCOUNTER — Encounter: Payer: Self-pay | Admitting: Family Medicine

## 2010-08-21 VITALS — BP 110/62 | Temp 98.2°F | Ht 61.0 in | Wt 111.0 lb

## 2010-08-21 DIAGNOSIS — F339 Major depressive disorder, recurrent, unspecified: Secondary | ICD-10-CM

## 2010-08-21 DIAGNOSIS — Z860100 Personal history of colon polyps, unspecified: Secondary | ICD-10-CM

## 2010-08-21 DIAGNOSIS — E119 Type 2 diabetes mellitus without complications: Secondary | ICD-10-CM

## 2010-08-21 DIAGNOSIS — N3946 Mixed incontinence: Secondary | ICD-10-CM

## 2010-08-21 DIAGNOSIS — I10 Essential (primary) hypertension: Secondary | ICD-10-CM

## 2010-08-21 DIAGNOSIS — K5909 Other constipation: Secondary | ICD-10-CM

## 2010-08-21 DIAGNOSIS — E7800 Pure hypercholesterolemia, unspecified: Secondary | ICD-10-CM

## 2010-08-21 MED ORDER — ROSUVASTATIN CALCIUM 20 MG PO TABS: 20.0000 mg | ORAL_TABLET | Freq: Every day | ORAL | Status: DC

## 2010-08-21 MED ORDER — HYDROCHLOROTHIAZIDE 25 MG PO TABS: 25.0000 mg | ORAL_TABLET | Freq: Every day | ORAL | Status: DC

## 2010-08-21 MED ORDER — POTASSIUM CHLORIDE CRYS ER 20 MEQ PO TBCR: 20.0000 meq | EXTENDED_RELEASE_TABLET | Freq: Every day | ORAL | Status: DC

## 2010-08-21 MED ORDER — POLYETHYLENE GLYCOL 3350 17 GM/SCOOP PO POWD: 17.0000 g | Freq: Every day | ORAL | Status: DC

## 2010-08-21 MED ORDER — METFORMIN HCL 500 MG PO TABS: 500.0000 mg | ORAL_TABLET | Freq: Every day | ORAL | Status: DC

## 2010-08-21 MED ORDER — TOLTERODINE TARTRATE ER 4 MG PO CP24: 4.0000 mg | ORAL_CAPSULE | Freq: Every day | ORAL | Status: DC

## 2010-08-21 MED ORDER — LISINOPRIL 40 MG PO TABS: 40.0000 mg | ORAL_TABLET | Freq: Every day | ORAL | Status: DC

## 2010-08-21 MED ORDER — AMLODIPINE BESYLATE 10 MG PO TABS: 10.0000 mg | ORAL_TABLET | Freq: Every day | ORAL | Status: DC

## 2010-08-21 NOTE — Patient Instructions (Addendum)
It was great meeting you!  Please come back for a lab only appt when fasting (nothing to eat or drink after midnight) so that we can draw some labs.  Try keeping yourself on a regular sleep schedule.  We can talk more about sleep hygiene the next time I see you!  Please come back to see me in 1-2 months.

## 2010-08-22 ENCOUNTER — Encounter: Payer: Self-pay | Admitting: Family Medicine

## 2010-08-22 NOTE — Assessment & Plan Note (Signed)
Last A1c ~4 mo ago was 7.0 (stable), will check A1c when pt comes back for lab only appt. Pt only taking metformin 500mg  once per day, will increase to BID depending on A1c. Last optho appt in 2010, pt states she has one coming up with Dr. Ronnald Ramp at [facility]. Last podiatry appt 03/2010, has one 09/24/2010.

## 2010-08-22 NOTE — Progress Notes (Signed)
  Subjective:    Patient ID: Mallory Hayes, female    DOB: [birth year], 65 y.o.   MRN: [id number]  HPI Pt comes in for a new pt visit. Last physical exam was 1 yr ago at [facility].  Pt is coming here just to establish care; her mother was a pt here. Spent the majority of time d/w pt about her mother's recent death (mom passed away ~1 week ago from Ovarian cancer). Pt and mother were very close-- lived together since her father died at an early age. Pt is very concerned now about ovarian cancer. Pt is very curious about the records of her "partial hysterectomy" in 1990s.  Pt's biggest problems: 1. Sleep: has trouble falling asleep and staying asleep, esp after eating late at night (12/06/08), but does not endorse any reflux symptoms.  Has been occuring for ~55yr, has not worsened over the past month since her mother has gotten sicker and passed.  However, she does endores that her mother had been sick ~1 yr before passing.   2. Constipation: chronic issue, last BM yesterday. Knows how to adjust miralax to keep BMs soft and regular.   3. Health Maintenance: up-to-date on colonoscopy (07/2008), mammo (01/2010), and pap (01/2010). Pt has never had an abnormal pap and understands that she no longer needs them as she is not sexually active, will be >66 when her next one is due, and has never had an abnml, however pt would like to continue so that she can be screen for ovarian cancer.  Pt was educated that paps do not screen for ovarian cancer, only cervical cancer.  Will broach this topic again at a later date. -Pt has not has zostavax, however, she had shingles ~ 3-4 yrs ago and therefore booster may not be beneficial.  Encouraged pt to find out if insurance covers the vaccine.  If so, she could go ahead and have it, otherwise it is likely not worth the cost of the vaccine for the minimal to no immune enhancement.  -no tob, EtOH, drugs  Review of Systems See HPI.    Objective:   Physical Exam  Vitals  reviewed. Constitutional: Vital signs are normal. She appears well-developed and well-nourished. She is cooperative. No distress.  HENT:  Head: Normocephalic and atraumatic.  Right Ear: Tympanic membrane and external ear normal.  Left Ear: Tympanic membrane and external ear normal.  Nose: Nose normal.  Mouth/Throat: Oropharynx is clear and moist. No oropharyngeal exudate.  Eyes: Conjunctivae and EOM are normal. Pupils are equal, round, and reactive to light. No scleral icterus.  Fundoscopic exam:      The right eye shows no AV nicking and no papilledema.       The left eye shows no AV nicking and no papilledema.  Neck: Normal range of motion. Neck supple. No thyromegaly present.  Cardiovascular: Normal rate, regular rhythm, normal heart sounds and intact distal pulses.   No murmur heard. Pulmonary/Chest: Effort normal and breath sounds normal. No respiratory distress. She has no wheezes.  Abdominal: Soft. Bowel sounds are normal. She exhibits no distension. There is no tenderness.  Musculoskeletal: She exhibits no edema and no tenderness.  Lymphadenopathy:    She has no cervical adenopathy.  Neurological: She is alert. No cranial nerve deficit.  Skin: Skin is warm and dry. No rash noted.          Assessment & Plan:  Come back for lab only visit. F/u in 1 month for mood and sleep.

## 2010-08-22 NOTE — Assessment & Plan Note (Signed)
Will check FLP.  Last panel ~60yr ago was good. Will continue crestor 20mg .

## 2010-08-22 NOTE — Assessment & Plan Note (Signed)
Colonoscopy UTD. No recent changes in bowel habits.

## 2010-08-22 NOTE — Assessment & Plan Note (Signed)
Stable. Continue current txt.

## 2010-08-22 NOTE — Assessment & Plan Note (Signed)
Stable. Last BM yesterday. Cont current miralax txt.

## 2010-08-22 NOTE — Assessment & Plan Note (Signed)
Has h/o depression, not currently on an anti-depressant. Mother, who she was very close to, just passed away earlier this month.  Pt very tearful when discussing mother.  Pt now living alone in the home she and her mom shared. States she has some support here with friends and cousins. States she is eating and drinking ok. No thoughts of wanting to hurt herself or others.  Pt is having difficulty sleeping, both falling asleep and staying asleep, but this has been ongoing for approx 1 year, and has not gotten worse since her mother passed. Could consider giving trazadone 100mg  qhs if this continues, but will work on sleep hygiene first. Will have pt f/u in 1 month to see how pt is doing with her mood and sleep.

## 2010-09-07 ENCOUNTER — Encounter: Payer: Self-pay | Attending: Gastroenterology | Admitting: *Deleted

## 2010-09-07 DIAGNOSIS — Z713 Dietary counseling and surveillance: Secondary | ICD-10-CM | POA: Insufficient documentation

## 2010-09-07 DIAGNOSIS — E119 Type 2 diabetes mellitus without complications: Secondary | ICD-10-CM | POA: Insufficient documentation

## 2010-09-12 NOTE — Letter (Signed)
April 09, 2007    Marcelino Duster, M.D.  [address] [place], [place] [address]   RE:  TIEKA, MANTOVANI  MRN:  [id number]  /  DOB:  06-23-1945   Dear Dr. Amil Amen:   Upon your kind referral, I had the pleasure of evaluating your patient  and I am pleased to offer my findings.  I saw Mallory Hayes in the office  today.  Enclosed is a copy of my progress note that details my findings  and recommendations.   Thank you for the opportunity to participate in your patient's care.    Sincerely,      Sandy Salaam. Deatra Ina, MD,FACG  Electronically Signed    RDK/MedQ  DD: 04/09/2007  DT: 04/10/2007  Job #: [id number]

## 2010-09-12 NOTE — Assessment & Plan Note (Signed)
[place]                         GASTROENTEROLOGY OFFICE NOTE   NAME:JONESCandiace, Mallory Hayes                         MRN:          [id number]  DATE:06/02/2007                            DOB:          November 24, 1945    PROBLEM LIST:  1. Colonic polyposis.  2. Weight loss.   Mallory Hayes has returned following colonoscopy.  A 25-mm sessile polyp was  removed from the ascending colon and a second similarly sized polyp was  removed from the sigmoid colon.  Pathology demonstrated adenomatous  changes and one tubulovillous adenoma.  Mallory Hayes has no GI complaints.  She does complain of insomnia and weight loss.  She is approximately 10  pounds below her usual weight.  She has a history of sleep apnea.  She  claims that her diet is restricted because of diabetes and she is always  feeling empty.   PHYSICAL EXAMINATION:  VITAL SIGNS:  Pulse 68, blood pressure 128/80,  weight 111.   IMPRESSION:  1. Colonic polyposis.  2. Weight loss.  This may be due to dietary restrictions related to      her diabetes.  3. Insomnia, likely related to sleep apnea.   RECOMMENDATIONS:  1. Followup colonoscopy in 1 year.  2. Refer to a nutritionist for counseling.  3. Pulmonary consult for sleep apnea.     Sandy Salaam. Deatra Ina, MD,FACG  Electronically Signed    RDK/MedQ  DD: 06/02/2007  DT: 06/02/2007  Job #: [id number]   cc:   Marcelino Duster, M.D.

## 2010-09-12 NOTE — Assessment & Plan Note (Signed)
[place] OFFICE NOTE   NAME:JONESMilbrey, Shagena                         MRN:          [id number]  DATE:04/09/2007                            DOB:          Sep 08, 1945    REASON FOR CONSULTATION:  Constipation.   HISTORY OF PRESENT ILLNESS:  Ms. Windecker is a 66 year old white female  referred through the courtesy of Dr. Amil Amen for evaluation. She has  been suffering from constipation for years. She takes MiraLax daily. She  will have a bowel movement about every 3-4 days. She does have some  abdominal distention and bloating in between. There is no history of  melena or hematochezia. She has lost about 10 pounds over the last few  months. Workup including CBC and CMET were unremarkable.   PAST MEDICAL HISTORY:  Pertinent for hypertension and diabetes. She  suffers from anxiety and sleep apnea. She is status post hysterectomy,  tubal ligation, appendectomy and cholecystectomy.   FAMILY HISTORY:  Pertinent for mother with heart disease.   MEDICATIONS:  Monopril/HCTZ, metformin, MiraLax, potassium, Nasacort,  oxybutynin, amlodipine and Crestor. She is allergic to SULFA.   She neither smokes nor drinks. She is single and is on disability for  her diabetes.   REVIEW OF SYSTEMS:  Positive for insomnia.   PHYSICAL EXAMINATION:  VITAL SIGNS:  Pulse 68, blood pressure 130/90,  weight 111.  HEENT:  EOMI.  PERRLA.  Sclerae are anicteric.  Conjunctivae are pink.  NECK:  Supple without thyromegaly, adenopathy or carotid bruits.  CHEST:  Clear to auscultation and percussion without adventitious  sounds.  CARDIAC:  Regular rhythm; normal S1 S2.  There are no murmurs, gallops  or rubs.  ABDOMEN:  Bowel sounds are normoactive.  Abdomen is soft, nontender and  nondistended.  There are no abdominal masses, tenderness, splenic  enlargement or hepatomegaly.  EXTREMITIES:  Full range of motion.  No cyanosis, clubbing or  edema.  RECTAL:  Deferred.   IMPRESSION:  1. Constipation. This is most likely functional constipation.  2. Hypertension.  3. Diabetes.  4. Sleep apnea.   RECOMMENDATION:  1. Fiber supplementation.  2. Continue MiraLax.  3. Screening colonoscopy.     Sandy Salaam. Deatra Ina, MD,FACG  Electronically Signed    RDK/MedQ  DD: 04/09/2007  DT: 04/10/2007  Job #: [id number]   cc:   Marcelino Duster, M.D.

## 2010-09-12 NOTE — Letter (Signed)
April 09, 2007    [name]   RE:  XITLALIC, GLASCOCK  MRN:  [id number]  /  DOB:  1945-09-13   Dear Ms. Ronnald Ramp:   It is my pleasure to have treated you recently as a new patient in my  office.  I appreciate your confidence and the opportunity to participate  in your care.   Since I do have a busy inpatient endoscopy schedule and office schedule,  my office hours vary weekly.  I am, however, available for emergency  calls every day through my office.  If I cannot promptly meet an urgent  office appointment, another one of our gastroenterologists will be able  to assist you.   My well-trained staff are prepared to help you at all times.  For  emergencies after office hours, a physician from our gastroenterology  section is always available through my 24-hour answering service.   While you are under my care, I encourage discussion of your questions  and concerns, and I will be happy to return your calls as soon as I am  available.   Once again, I welcome you as a new patient and I look forward to a happy  and healthy relationship.    Sincerely,      Sandy Salaam. Deatra Ina, MD,FACG  Electronically Signed   RDK/MedQ  DD: 04/09/2007  DT: 04/10/2007  Job #: [id number]

## 2010-09-15 NOTE — Op Note (Signed)
[facility]  Patient:    Mallory Hayes, Mallory Hayes                         MRN: [id number] Proc. Date: 12/01/99 Adm. Date:  [id number] Disc. Date: [id number] Attending:  Allyn Kenner CC:         Harden Mo, M.D.                           Operative Report  CCS# [id number]  PREOPERATIVE DIAGNOSIS:  Chronic cholecystitis and cholelithiasis.  POSTOPERATIVE DIAGNOSIS:  Chronic cholecystitis and cholelithiasis.  OPERATION PERFORMED:  Laparoscopic cholecystectomy with intraoperative cholangiogram.  SURGEON:  Jaci Carrel, M.D.  ASSISTANT:  Judeth Horn, M.D.  ANESTHESIA:  General endotracheal.  DESCRIPTION OF PROCEDURE:  Under adequate general endotracheal anesthesia, the patients abdomen was prepped and draped in the usual fashion.  A short vertical incision was made in the inferior portion of the umbilicus and carried down through the deep fascia.  It was incised longitudinally.  The rectus muscle was retracted and the posterior rectus sheath was incised.  The peritoneum was then entered.  Pursestring suture of 0 Vicryl was applied to the incision and a Hasson cannula was introduced into the abdomen under direct vision.  The cannula was secured with the pursestring suture.  The abdomen was then inflated with carbon dioxide to a filling pressure of 15 mmHg with an automatic insufflator.  A laparoscopic video scope was inserted and using this for direct vision, a 10 mm upper midline disposable port was inserted and two disposable 5 mm ports were inserted.  The patient was placed in the reverse Trendelenburg position and turned to the left.  The gallbladder appeared to be chronically inflamed and thickwalled.  It was grasped with grasping forceps and placed on traction superiorly.  There were adhesions between the omentum and the right lobe of the liver that were not allowing the liver to be retracted superiorly.  These ahesions were taken down over  endoclips and also by electrocoagulation.  This allowed greater mobility of the liver and more extension on the fundus of the gallbladder.  The triangle of Calot was exposed.  The cystic artery was dissected out anteriorly and triple clipped with Endoclips and transected between the distal two clips.  The cystic duct was then isolated and skeletonized and clipped at the cystic duct gallbladder junction.  A small incision was made in the cystic duct.  A reddick catheter was introduced into the abdomen through a 16 gauge Angiocath and threaded into the cystic duct.  It was held in place with an Endoclip.  Intraoperative cholangiogram was then done using 10 cc of 25% Hypaque.  This revealed good flow of dye into the duodenum, normal caliber common bile duct and no intraluminal filling defects. Following the cholangiogram, the clip was removed from the cystic duct and the Reddick catheter was withdrawn from the cystic duct and from the abdomen.  The cystic duct was doubly clipped just below the incision and it was divided above the double clip application.  The gallbladder was dissected out of the gallbladder bed of the liver.  In doing this, we found also a posterior branch of the cystic artery which was also triply clipped with Angioclips and transected between the distal two clips. The gallbladder was completely removed from the gallbladder bed.  There was one small entrance into the gallbladder during the  dissection and this allowed for the escape of some white bile.  It was aspirated and irrigated. The gallbladder was then placed in an Endocatch bag which was withdrawn with the umbilical port from the umbilical incision.  The subhepatic and subphrenic spaces were irrigated with sterile saline solution until clear.  The pursestring suture of the umbilical port was tied securely.  The undersurface was checked with the laparoscope and there were no adhesions.  The subcostal ports were then  withdrawn under direct vision and there was no bleeding.  The abdomen was deflated.  The upper midline port was withdrawn.  The subcuticular layer of the port incisions were closed with 5-0 Vicryl. Half-inch Steri-Strips were applied to the skin and sterile dressings were applied.  Estimated blood loss for this procedure was negligible.  The patient tolerated the procedure well and left the operating room in satisfactory condition. DD:  12/01/99 TD:  12/04/99 Job: [id number]

## 2010-09-15 NOTE — Group Therapy Note (Signed)
NAME:  Mallory Hayes, SALLEE NO.:  [id number]   MEDICAL RECORD NO.:  [id number]          PATIENT TYPE:  WOC   LOCATION:  [facility]                   FACILITY:  [facility]   PHYSICIAN:  Darron Doom, MD        DATE OF BIRTH:  03-25-1946   DATE OF SERVICE:                                    CLINIC NOTE   CHIEF COMPLAINT:  Yearly exam.   HISTORY OF PRESENT ILLNESS:  Patient is a 65 year old African-American  female, who is status post hysterectomy for fibroid tumors in 1991.  She  also had removal of one ovary at that time.  The patient, since that time,  has been on Ogen 0.625 for management of hot flashes.  She states that when  she does not take the medicine for a few days, she begins to have symptoms  and just does not feel well.  She notes some mood disturbance, as well as  becoming more hot.   The patient's last Pap smear was in 2005.  She was told at that time she  probably did not need further Pap smears, but was not sure about that and  comes in today for that.  She had her last mammogram in October of 2006,  which was normal.  She is scheduled for colonoscopy coming up.  Her past  medical history is significant for hypertension, elevated cholesterol  controlled with diet and intermittent bradycardia.  Her primary care  physician is Dr. Alvis Lemmings on [facility].   SURGICAL HISTORY:  She has had THO and unilateral salpingo-oophorectomy.  The patient's is unsure which side was removed.  She had a cholecystectomy,  appendectomy, and breast biopsy.   MEDICATIONS:  1.  Monopril 40 mg daily.  2.  Ogen 0.625 mg.  She is on 1-1/2 tabs p.o. daily.  3.  Klor-Con 30 mg daily.  4.  HCTZ 25 mg daily.   ALLERGIES:  SULFA.   GYNECOLOGIC HISTORY:  No history of abnormal Pap smears.   FAMILY HISTORY:  Hypertension and mother has osteoporosis.  No significant  history of breast cancer.   SOCIAL HISTORY:  No tobacco, alcohol, or drug use.   REVIEW OF SYSTEMS:  A  14-point review of systems was reviewed.  Please see  GYN history in the chart.  Basically, positive for fatigue and muscle aches.   PHYSICAL EXAMINATION:  VITAL SIGNS:  Blood pressure 135/76, weight is 128.7,  pulse of 65.  GENERAL: She is a well-developed, well-nourished black female in no acute  distress.  HEENT:  Normocephalic, atraumatic.  Sclerae, anicteric.  NECK:  Supple.  Normal thyroid.  LUNGS:  Clear bilaterally.  CARDIOVASCULAR:  Regular rate and rhythm.  No rubs, gallops or murmurs.  ABDOMEN:  Soft, nontender and nondistended.  BREAST:  Symmetric with everted nipples.  There is no supraclavicular or  axillary adenopathy.  EXTREMITIES:  No cyanosis, clubbing or edema.  GENITOURINARY:  Normal external female genitalia.  The external genitalia is  somewhat atrophic.  BUS is normal.  The vagina is pink and rugated.  Thin  white vaginal discharge noted.  Cervix and uterus are surgically absent.  Adnexa could not be well appreciated give the skewed anatomy.   IMPRESSION:  Yearly exam in a patient with a partial hysterectomy for benign  cause and no history of abnormal Paps.  Chronic hormone replacement therapy  use.   PLAN:  Encouraged yearly clinical breast exams and yearly mammograms.  The  patient should no longer need Pap smears.  Discussed at length increased  risk of breast cancer, results of [facility] study, risk of osteoporosis and  symptomatic relief with HRT.  The patient does not want any increased risk  of breast cancer and would like to come off the Ogen, which she has been on  for greater than 15 years.  I have discussed decreasing her Ogen to one  tablet daily for two weeks, and then half a tablet daily for two weeks and  then coming off.  The patient will return for followup in two months to see  how her symptoms are doing off the medication.  1.  The patient is also given information about vitamin D calcium      replacement.  It should be necessary in someone in  her age group and      Black Cohosh herbal remedy for hot flashes.           ______________________________  Darron Doom, MD     TP/MEDQ  D:  05/18/2005  T:  05/19/2005  Job:  [id number]

## 2010-09-15 NOTE — Group Therapy Note (Signed)
NAME:  ERIE, COORS NO.:  [id number]   MEDICAL RECORD NO.:  [id number]          PATIENT TYPE:  WOC   LOCATION:  [facility]                   FACILITY:  [facility]   PHYSICIAN:  Darron Doom, MD        DATE OF BIRTH:  1945/09/29   DATE OF SERVICE:                                    CLINIC NOTE   CHIEF COMPLAINT:  Followup.   HISTORY OF PRESENT ILLNESS:  Patient is a 65 year old African-American  woman, who is status post hysterectomy, who has been on estrogen replacement  for the past 15 years.  At her last visit, we talk about decreasing her  estrogen and tapering off, and starting an herbal remedy and she felt she  had to have it.  She is very concerned about her increased risk of breast  cancer.  However, since her last visit, she has failed to diminish her  requirement of estrogen because when she tried to come off, she felt like  her memory got worse and that she felt weak, and was unable to do the things  she normally would do.  Fifteen minutes was spent with this patient today  discussing the [facility] studies, the findings, the limitations of the studies,  the actual numbers of increased risk of breast cancer.  The patient  understands all of this.  She really is concerned about her breast cancer  risk.  She does want to try to taper herself off.  However, she has been  unable to do that as yet.  She will try again.  At her last visit, we also  had a lengthy discussion about her calcium supplementation plus vitamin D.  She said that she is unable to take that medicine as it made her feel weird.  Discussed dairy requirements, daily calcium requirements of 1500  international units plus vitamin D as supplementation to increase absorption  and help with decreasing bone loss.  The patient understands all of this.  She will increase dairy consumption and her diet until she gets the correct  amount of the calcium plus D.  She will continue to work on tapering off her  estrogen and black cohosh.  She will follow up in October when she has done  her mammogram to see how she is doing, as well as her annual self breast  exam can be completed at that time.  The patient may call in if she would  like her estrogen refilled.           ______________________________  Darron Doom, MD     TP/MEDQ  D:  07/12/2005  T:  07/14/2005  Job:  [id number]

## 2010-09-26 ENCOUNTER — Other Ambulatory Visit: Payer: Self-pay | Admitting: *Deleted

## 2010-09-26 DIAGNOSIS — K5909 Other constipation: Secondary | ICD-10-CM

## 2010-09-26 MED ORDER — POLYETHYLENE GLYCOL 3350 17 GM/SCOOP PO POWD: 17.0000 g | Freq: Every day | ORAL | Status: DC

## 2010-09-28 ENCOUNTER — Other Ambulatory Visit (INDEPENDENT_AMBULATORY_CARE_PROVIDER_SITE_OTHER): Payer: Self-pay

## 2010-09-28 DIAGNOSIS — I10 Essential (primary) hypertension: Secondary | ICD-10-CM

## 2010-09-28 DIAGNOSIS — E119 Type 2 diabetes mellitus without complications: Secondary | ICD-10-CM

## 2010-09-28 DIAGNOSIS — F339 Major depressive disorder, recurrent, unspecified: Secondary | ICD-10-CM

## 2010-09-28 DIAGNOSIS — E7800 Pure hypercholesterolemia, unspecified: Secondary | ICD-10-CM

## 2010-09-28 LAB — LIPID PANEL
LDL Cholesterol: 85 mg/dL (ref 0–99)
Triglycerides: 47 mg/dL (ref <150)
VLDL: 9 mg/dL (ref 0–40)

## 2010-09-28 LAB — CBC
HCT: 35.9 % — ABNORMAL LOW (ref 36.0–46.0)
MCHC: 33.1 g/dL (ref 30.0–36.0)
MCV: 93 fL (ref 78.0–100.0)
Platelets: 172 10*3/uL (ref 150–400)
RDW: 13.5 % (ref 11.5–15.5)

## 2010-09-28 LAB — COMPREHENSIVE METABOLIC PANEL
ALT: 15 U/L (ref 0–35)
AST: 17 U/L (ref 0–37)
Calcium: 10.1 mg/dL (ref 8.4–10.5)
Chloride: 101 mEq/L (ref 96–112)
Creat: 1.08 mg/dL (ref 0.40–1.20)
Sodium: 138 mEq/L (ref 135–145)
Total Bilirubin: 0.5 mg/dL (ref 0.3–1.2)
Total Protein: 7.4 g/dL (ref 6.0–8.3)

## 2010-09-28 NOTE — Progress Notes (Signed)
FLP,CMP,CBC,VIT D AND A1C DONE TODAY Mallory Hayes

## 2010-09-29 ENCOUNTER — Other Ambulatory Visit: Payer: Self-pay | Admitting: Family Medicine

## 2010-09-29 DIAGNOSIS — K219 Gastro-esophageal reflux disease without esophagitis: Secondary | ICD-10-CM

## 2010-09-29 DIAGNOSIS — N3946 Mixed incontinence: Secondary | ICD-10-CM

## 2010-09-29 DIAGNOSIS — I10 Essential (primary) hypertension: Secondary | ICD-10-CM

## 2010-09-29 DIAGNOSIS — E7800 Pure hypercholesterolemia, unspecified: Secondary | ICD-10-CM

## 2010-09-29 MED ORDER — ROSUVASTATIN CALCIUM 20 MG PO TABS: 20.0000 mg | ORAL_TABLET | Freq: Every day | ORAL | Status: DC

## 2010-09-29 MED ORDER — AMLODIPINE BESYLATE 10 MG PO TABS: 10.0000 mg | ORAL_TABLET | Freq: Every day | ORAL | Status: DC

## 2010-09-29 MED ORDER — PANTOPRAZOLE SODIUM 40 MG PO TBEC: 40.0000 mg | DELAYED_RELEASE_TABLET | Freq: Every day | ORAL | Status: DC

## 2010-09-29 MED ORDER — QUINAPRIL-HYDROCHLOROTHIAZIDE 20-12.5 MG PO TABS: 2.0000 | ORAL_TABLET | Freq: Every day | ORAL | Status: DC

## 2010-09-29 MED ORDER — TOLTERODINE TARTRATE ER 4 MG PO CP24: 4.0000 mg | ORAL_CAPSULE | Freq: Every day | ORAL | Status: DC

## 2010-10-02 ENCOUNTER — Telehealth: Payer: Self-pay | Admitting: Family Medicine

## 2010-10-02 NOTE — Telephone Encounter (Signed)
Fwd to PCP

## 2010-10-02 NOTE — Telephone Encounter (Signed)
Pt is asking about results for her labs

## 2010-10-03 ENCOUNTER — Encounter: Payer: Self-pay | Admitting: Family Medicine

## 2010-10-04 ENCOUNTER — Ambulatory Visit (INDEPENDENT_AMBULATORY_CARE_PROVIDER_SITE_OTHER): Payer: Self-pay | Admitting: Family Medicine

## 2010-10-04 ENCOUNTER — Ambulatory Visit: Payer: Self-pay | Admitting: Family Medicine

## 2010-10-04 ENCOUNTER — Other Ambulatory Visit: Payer: Self-pay | Admitting: Family Medicine

## 2010-10-04 ENCOUNTER — Encounter: Payer: Self-pay | Admitting: Family Medicine

## 2010-10-04 VITALS — BP 128/70 | HR 56 | Temp 98.6°F | Wt 115.0 lb

## 2010-10-04 DIAGNOSIS — E119 Type 2 diabetes mellitus without complications: Secondary | ICD-10-CM

## 2010-10-04 DIAGNOSIS — F411 Generalized anxiety disorder: Secondary | ICD-10-CM

## 2010-10-04 DIAGNOSIS — I10 Essential (primary) hypertension: Secondary | ICD-10-CM

## 2010-10-04 DIAGNOSIS — K219 Gastro-esophageal reflux disease without esophagitis: Secondary | ICD-10-CM

## 2010-10-04 DIAGNOSIS — F339 Major depressive disorder, recurrent, unspecified: Secondary | ICD-10-CM

## 2010-10-04 DIAGNOSIS — E7800 Pure hypercholesterolemia, unspecified: Secondary | ICD-10-CM

## 2010-10-04 DIAGNOSIS — N3946 Mixed incontinence: Secondary | ICD-10-CM

## 2010-10-04 DIAGNOSIS — K5909 Other constipation: Secondary | ICD-10-CM

## 2010-10-04 MED ORDER — POLYETHYLENE GLYCOL 3350 17 GM/SCOOP PO POWD: 17.0000 g | Freq: Every day | ORAL | Status: DC

## 2010-10-04 MED ORDER — PANTOPRAZOLE SODIUM 40 MG PO TBEC: 40.0000 mg | DELAYED_RELEASE_TABLET | Freq: Every day | ORAL | Status: DC

## 2010-10-04 MED ORDER — POTASSIUM CHLORIDE CRYS ER 20 MEQ PO TBCR: 20.0000 meq | EXTENDED_RELEASE_TABLET | Freq: Every day | ORAL | Status: DC

## 2010-10-04 MED ORDER — TOLTERODINE TARTRATE ER 4 MG PO CP24: 4.0000 mg | ORAL_CAPSULE | Freq: Every day | ORAL | Status: DC

## 2010-10-04 MED ORDER — QUINAPRIL-HYDROCHLOROTHIAZIDE 20-12.5 MG PO TABS: 2.0000 | ORAL_TABLET | Freq: Every day | ORAL | Status: DC

## 2010-10-04 MED ORDER — AMLODIPINE BESYLATE 10 MG PO TABS: 10.0000 mg | ORAL_TABLET | Freq: Every day | ORAL | Status: DC

## 2010-10-04 MED ORDER — ROSUVASTATIN CALCIUM 20 MG PO TABS: 20.0000 mg | ORAL_TABLET | Freq: Every day | ORAL | Status: DC

## 2010-10-04 MED ORDER — TRAZODONE HCL 50 MG PO TABS: 50.0000 mg | ORAL_TABLET | Freq: Every day | ORAL | Status: DC

## 2010-10-04 MED ORDER — METFORMIN HCL 500 MG PO TABS: 500.0000 mg | ORAL_TABLET | Freq: Every day | ORAL | Status: DC

## 2010-10-04 NOTE — Telephone Encounter (Signed)
Attempted to call pt (both home and mobile numbers). No answer at one, straight to VM on the other. Left a message to call clinic back.  I will send pt a letter with her results, but if she calls back, you can tell her that all of her labs look good and there is nothing that we need to do immediately for any of them. Thanks!

## 2010-10-04 NOTE — Assessment & Plan Note (Signed)
Having difficulty sleeping.  Would like to try something non-addictive to help her sleep. Will start trazodone 50 qhs w/ instructions to 1/2 pill if having grogginess in the mornings.

## 2010-10-04 NOTE — Assessment & Plan Note (Signed)
Pt continues having occasional crying spells, but is able to go about daily life. Does not want to start anti-depressant at this time, but would like to try something to help her sleep.  Appetite has improved, as has concentration and daily activities.  -Trazodone 50 qhs F/u for A1c in 2 1/2 months, sooner if necessary.

## 2010-10-04 NOTE — Assessment & Plan Note (Addendum)
CBGs usually 120s-140s; seeing nutritionist at [facility] tomorrow.   A1c had slightly increased from 7.0 to 7.4; pt does not wish to increase metformin at this time, would prefer dietary changes first.  Will hold on increasing to BID. Recheck A1c after 12/28/10 and will make med changes at that time if needed.

## 2010-10-04 NOTE — Patient Instructions (Signed)
I am giving you a prescription for trazodone, which will hopefully help you sleep.  You can take it as needed.  If a whole pill makes you drowsy in the morning, you can try taking half of a pill.  We can try stopping your Crestor.  Try to take a Multi-vitamin and Fish Oil and we will recheck your cholesterol number in about 6 months.  Come back and see me the beginning of September so we can recheck your A1c number.  Feel free to come sooner if you need anything before then!

## 2010-10-04 NOTE — Progress Notes (Signed)
S: Pt p/w main complaint of difficulty sleeping.  This has been an ongoing problem for the past year, but has gotten worse since her mother passed away a few months ago.  Pt feels her appetite has been improving and that she is doing well during the day.  She continues to have crying spells at times, but is able to keep herself busy and distracted during the day with activities.  However, at night, she feels as if all of her emotions come out and she is unable to sleep.  Would like something to help her sleep, but does not want something addictive.  Pt also interested in trying to cut back on the number of medications she is on.  Willing to make necessary dietary changes and seeing a nutritionist tomorrow.  O:  Gen: NAD, greatly improved mood HEENT: EOMI, PERRLA, MMM CV: RRR Ext: WWP, no edema  A/P: 65 yo F w/ recent loss of mother p/w difficulty sleeping -Will start trazodone 50 qhs -CBGs stable, A1c slightly increased to 7.4, will hold increasing metformin per pt request and recheck A1c in 3 mo -Will d/c Crestor at this time and check LDL in 6 months, FLP in 1 year; suggested fish oil

## 2010-10-05 ENCOUNTER — Encounter: Payer: Self-pay | Attending: Gastroenterology | Admitting: *Deleted

## 2010-10-05 DIAGNOSIS — E119 Type 2 diabetes mellitus without complications: Secondary | ICD-10-CM | POA: Insufficient documentation

## 2010-10-05 DIAGNOSIS — Z713 Dietary counseling and surveillance: Secondary | ICD-10-CM | POA: Insufficient documentation

## 2010-10-27 ENCOUNTER — Telehealth: Payer: Self-pay | Admitting: Family Medicine

## 2010-10-27 NOTE — Telephone Encounter (Signed)
Forward to [place] for review.

## 2010-10-27 NOTE — Telephone Encounter (Signed)
Was instructed to take Fish Oil but it makes her sick so she wants to see if there is something else she can take, possibly a different dosage.

## 2010-10-28 NOTE — Telephone Encounter (Signed)
1 tablespoon of flaxseed per day also has omega-3, so she could try this instead of the fish oil

## 2010-10-30 ENCOUNTER — Telehealth: Payer: Self-pay | Admitting: *Deleted

## 2010-10-30 NOTE — Telephone Encounter (Signed)
Tried to call x 3. Always busy line. Please see Dr.McGill's message and tell pt. Thanks. [name], [name]

## 2010-11-10 ENCOUNTER — Ambulatory Visit (HOSPITAL_COMMUNITY)
Admission: RE | Admit: 2010-11-10 | Discharge: 2010-11-10 | Disposition: A | Discharge status: Home / Self Care | Payer: Self-pay | Source: Ambulatory Visit | Attending: Family Medicine | Admitting: Family Medicine

## 2010-11-10 ENCOUNTER — Ambulatory Visit (INDEPENDENT_AMBULATORY_CARE_PROVIDER_SITE_OTHER): Payer: Self-pay | Admitting: Family Medicine

## 2010-11-10 ENCOUNTER — Encounter: Payer: Self-pay | Admitting: Family Medicine

## 2010-11-10 VITALS — BP 124/70 | HR 56 | Temp 98.0°F | Ht 61.0 in | Wt 116.7 lb

## 2010-11-10 DIAGNOSIS — R0789 Other chest pain: Secondary | ICD-10-CM | POA: Insufficient documentation

## 2010-11-10 DIAGNOSIS — F339 Major depressive disorder, recurrent, unspecified: Secondary | ICD-10-CM

## 2010-11-10 DIAGNOSIS — E7800 Pure hypercholesterolemia, unspecified: Secondary | ICD-10-CM

## 2010-11-10 DIAGNOSIS — E119 Type 2 diabetes mellitus without complications: Secondary | ICD-10-CM

## 2010-11-10 DIAGNOSIS — R079 Chest pain, unspecified: Secondary | ICD-10-CM | POA: Insufficient documentation

## 2010-11-10 NOTE — Assessment & Plan Note (Signed)
D/c'ed Crestor at last visit per pt request.  Was not able to tolerate fish oil.  Will try flaxseed.  Recheck LDL in ~5 months for progress.

## 2010-11-10 NOTE — Assessment & Plan Note (Signed)
Tolerating metformin; recheck A1c in ~2 months.

## 2010-11-10 NOTE — Progress Notes (Signed)
S: Pt comes in today for chest pain.  Pt states that she has been having a sharp/pricking sensation over her left chest off and on since last week.  It only occurs first thing in the morning after she wakes up, before she gets out of bed, and at night when she is laying in bed resting.  Not painful or pressure, just like a pin prick.  Does not feel like her heart is fluttering or beating hard.  No associated symptoms, including no SOB, numbness or tingling in her left arm or jaw, diaphoresis, nausea, or dizziness.  It did not occur last night or this morning.  No other problems with chest pain.  No h/o MI or CAD that she knows of.  Pt also w/ complaint of cut on her foot.  She thinks that it was some sort of stinger on her left inner ankle.  She woke up one morning last week and her ankle was swollen, red, and sore.  It has since improved but she still feels like there is something stuck in there.  No fevers or other systemic symptoms.  No rash anymore, never had streaking up her leg.    O: VSS Filed Vitals:   11/10/10 1438  BP: 124/70  Pulse: 56  Temp: 98 F (36.7 C)    Gen: NAD CV: RRR, no murmur Pulm: CTAB Ext: WWP, no edema; left medial tibial tuberosity with small splinter-like object 22mm out of skin w/o surrounding erythema or edema  EKG: WNL other than sinus brady   A/P: 65 yo AAF w/ PMH of HTN, DM p/w chest "pricks" -CP atypical, not concerning for angina or CAD; EKG WNL; BP WNL -continue monitoring for other s/s of chest pain -attempted "splinter" removal from left medial ankle with tweezers-- appeared to be scab vs very superficial foreign body -f/u in 2 months for A1c check; 5 months for LDL to assess being off Crestory

## 2010-11-10 NOTE — Assessment & Plan Note (Signed)
"  Pin prick" in nature; non-anginal; nml EKG other than sinus brady (pt has been bradycardic at every visit thus far).  Has not had it in >24 hours.  Will continue to monitor. Pt given red flags to return to ED for. Ddx inc GERD vs anxiety vs MSK.

## 2010-11-10 NOTE — Patient Instructions (Addendum)
It was great to see you again.  You seem like your mood is starting to improve, which I am very glad about! I'm also very glad that you came in since you were having this chest discomfort.  The good news is that your EKG looks perfect.   Please go to the ED if you ever have this chest pain, if it feels like pressure or if you have difficulty breathing with it.  As for a substitute for the fish oil, you can try 1 tablespoon of flaxseed per day also has the good omega-3 fatty acids.  Please come back and see me in 2 months so we can check your A1c to see how your diabetes are doing.  Please come sooner if you have any concerns.

## 2010-11-10 NOTE — Assessment & Plan Note (Signed)
Mood significantly better.  Has not been needing Trazodone- only used it a few times and is now sleeping well on her own.  Is able to get out and do things such as go to [place].  No need for anti-depressant at this time.

## 2011-01-09 ENCOUNTER — Encounter: Payer: Self-pay | Admitting: Family Medicine

## 2011-01-09 ENCOUNTER — Ambulatory Visit (INDEPENDENT_AMBULATORY_CARE_PROVIDER_SITE_OTHER): Payer: Self-pay | Admitting: Family Medicine

## 2011-01-09 DIAGNOSIS — I10 Essential (primary) hypertension: Secondary | ICD-10-CM

## 2011-01-09 DIAGNOSIS — E7800 Pure hypercholesterolemia, unspecified: Secondary | ICD-10-CM

## 2011-01-09 DIAGNOSIS — H05829 Myopathy of extraocular muscles, unspecified orbit: Secondary | ICD-10-CM

## 2011-01-09 DIAGNOSIS — F339 Major depressive disorder, recurrent, unspecified: Secondary | ICD-10-CM

## 2011-01-09 DIAGNOSIS — K5909 Other constipation: Secondary | ICD-10-CM

## 2011-01-09 DIAGNOSIS — R0789 Other chest pain: Secondary | ICD-10-CM

## 2011-01-09 DIAGNOSIS — E119 Type 2 diabetes mellitus without complications: Secondary | ICD-10-CM

## 2011-01-09 DIAGNOSIS — H519 Unspecified disorder of binocular movement: Secondary | ICD-10-CM

## 2011-01-09 MED ORDER — METFORMIN HCL 1000 MG PO TABS: 1000.0000 mg | ORAL_TABLET | Freq: Every day | ORAL | Status: DC

## 2011-01-09 NOTE — Patient Instructions (Signed)
It was great to see you! I'm so glad you are starting to feel bette! We are going to increase your metformin from 500mg  to 1000mg  every morning.  I am giving you a new prescription so you can keep taking 1 tablet at a time, but if you have medicine left, just start taking TWO tablets in the morning until you fill the new one. Your A1c was 7.4, so it it close to goal! We would like to see it <7.0. Make an appointment to see your eye doctor in the next few months! Come back and see me in 3 months.  We will check your cholesterol and recheck your diabetes labs at that appointment.

## 2011-01-11 ENCOUNTER — Encounter: Payer: Self-pay | Admitting: Family Medicine

## 2011-01-11 DIAGNOSIS — H05829 Myopathy of extraocular muscles, unspecified orbit: Secondary | ICD-10-CM | POA: Insufficient documentation

## 2011-01-11 NOTE — Assessment & Plan Note (Signed)
Same sharp pains as at last visit, pt does think that they increased in frequency to qAM for a few weeks after stopping her protonix.  Encouraged pt to restart PPI. Low suspicion of cardiac etiology. Nml EKG at last visit.  Has not had this pain in >1 week. No w/u needed at this time. Pt given red flags to return for.

## 2011-01-11 NOTE — Progress Notes (Signed)
S: Pt comes in today for follow up.  HYPERTENSION BP: 132/84 Meds: norvasc, accuretic Taking meds: Yes     # of doses missed/week: 1 Symptoms: Headache: No Dizziness: No Vision changes: No SOB:  No   Chest pain: Yes : sharp, similar to the pains she was having as last visit, had them for a few weeks but they resolved spontaneously, has not have any "heart pains" in >1 week, noticed that the frequency increased/worsened after stopping her protonix; has not tried anything for them including no nitro; most often occuring in the mornings, no palpitations, diaphoresis, SOB or radiation, just isolated left sternal border sharp pains, had normal EKG at last visit for these same pains (sinus brady)   LE swelling: No Tobacco use: No   DIABETES Home CBGs: does not check Meds: metformin 500  Taking Meds: yes # of doses missed per week: 1 Hypoglycemic episodes?: no Last foot exam:  Last eye exam: 1 year ago Symptoms: Polyuria: no Polydipsia: no Parasthesias: no   Dizziness: no  Nausea:  no Vomiting:  no Last A1c: 7.4 today   EYE WEAKNESS Has been on going for a few weeks- feels like her left eye is weak and it is difficult to read in the morning. Feels like her eye lid is weak, gets better as the day goes on.  Sometimes feels like there is something scratchy in it, but can never seen anything/get anything out.  No blurry vision or spots in her vision.  Light tends to both left eye more in the morning and then improves throughout the day.  No pain.  Is due for her yearly eye exam.    HYPERLIPIDEMIA Per pt request, Crestor had been d/c'ed, she tried fish oil but could not tolerate.  Now using flaxseed and tolerating it.  Initially had some gas/GI upset but has now adjusted to it.    CONSTIPATION Has BM q3 days. Taking miralax daily. No nausea/vomiting/diarrhea.  Doing well overall. Comfortable with BMs. No straining or bleeding.  Does not feel like she needs to change her medicines.    MOOD/DEPRESSION Overall doing much better.  Back to going to [facility] and doing more things.  Sleeping well w/o trazodone. No SI/HI. Decreased crying spells but still misses her mom.    ROS: Per HPI  History  Smoking status  . Never Smoker   Smokeless tobacco  . Never Used    O:  Filed Vitals:   01/09/11 1359  BP: 132/84  Pulse: 54    Gen: NAD CV: RRR, no murmur Pulm: CTA bilat, no wheezes or crackles Abd: soft, NT Ext: Warm, no chronic skin changes, no edema   A/P: 65 y.o. female p/w eye weakness, HTN, DM, HLD, constipation, depression -See problem list -f/u in 3 months

## 2011-01-11 NOTE — Assessment & Plan Note (Signed)
BM q3 days, using miralax daily. Continue current txt.

## 2011-01-11 NOTE — Assessment & Plan Note (Signed)
Mood significantly better.  Has not been needing Trazodone- only used it a few times and is now sleeping well on her own.  Is able to get out and do things such as go to [place].  No need for anti-depressant at this time.

## 2011-01-11 NOTE — Assessment & Plan Note (Signed)
Stable, 132/85, HR 54. Continue current txt, no med changes needed.  BP Readings from Last 3 Encounters:  01/09/11 132/84  11/10/10 124/70  10/04/10 128/70

## 2011-01-11 NOTE — Assessment & Plan Note (Signed)
A1c stable at 7.4. Tolerating metformin, will increase to 1000mg  qd from 500.  Pt due for optho exam. Not checking CBGs at home.

## 2011-01-11 NOTE — Assessment & Plan Note (Signed)
Exam benign, objectively no weakness, per pt, feels like eye lid is weak and it is difficult to read in the morning.  Fundoscopic and neuro exams nml.  Encouraged pt to see optho for full eye exam. No obvious deformity or foreign body.

## 2011-01-11 NOTE — Assessment & Plan Note (Signed)
Using flaxseed, will recheck FLP in 3 months to see if pt can continue off of statin.

## 2011-02-02 ENCOUNTER — Ambulatory Visit (INDEPENDENT_AMBULATORY_CARE_PROVIDER_SITE_OTHER): Payer: Self-pay

## 2011-02-02 DIAGNOSIS — Z23 Encounter for immunization: Secondary | ICD-10-CM

## 2011-02-14 ENCOUNTER — Encounter: Payer: Self-pay | Admitting: Family Medicine

## 2011-02-14 ENCOUNTER — Ambulatory Visit (INDEPENDENT_AMBULATORY_CARE_PROVIDER_SITE_OTHER): Payer: Self-pay | Admitting: Family Medicine

## 2011-02-14 VITALS — BP 146/70 | HR 49 | Temp 98.1°F | Ht 61.0 in | Wt 117.6 lb

## 2011-02-14 DIAGNOSIS — E119 Type 2 diabetes mellitus without complications: Secondary | ICD-10-CM

## 2011-02-14 DIAGNOSIS — I10 Essential (primary) hypertension: Secondary | ICD-10-CM

## 2011-02-14 DIAGNOSIS — F339 Major depressive disorder, recurrent, unspecified: Secondary | ICD-10-CM

## 2011-02-14 DIAGNOSIS — K5909 Other constipation: Secondary | ICD-10-CM

## 2011-02-14 MED ORDER — METFORMIN HCL 500 MG PO TABS: 500.0000 mg | ORAL_TABLET | Freq: Every day | ORAL | Status: DC

## 2011-02-14 NOTE — Assessment & Plan Note (Signed)
Overall doing better, but not sleeping great again.  Has not been using trazodone.  Asking for a benzo to help her sleep.  Counseled her that I do not Rx benzos for sleep and encouraged her to take trazodone which has already been Rx'ed.  No SI/HI

## 2011-02-14 NOTE — Progress Notes (Signed)
S: Pt comes in today for follow up.  HYPERTENSION BP: 146/70 Meds: accuretic, norvasc Taking meds: Yes     # of doses missed/week: 0 Symptoms: Headache: Yes: tension   Dizziness: No      Vision changes: No SOB:  No Chest pain: No LE swelling: No Tobacco use: No   DIABETES Home CBGs: doesn't check Meds: metformin 500 Taking Meds: yes, BUT had to stop metformin 1000 b/c of nausea/diarrhea, has been back on 500 x3 days and tolerating much better # of doses missed per week: 0 Hypoglycemic episodes?: no  Symptoms: Polyuria: no Polydipsia: no Parasthesias: no   Dizziness: no  Nausea:  yes Vomiting:  no Last A1c:  Lab Results  Component Value Date   HGBA1C 7.4 01/09/2011     CONSTIPATION Was having diarrhea w/ increased metformin dose so had stopped miralax.  Stopped increased metformin 3 days ago and was constipated this AM, only had small BM but it was hard and she had to strain, now has restarted miralax.   DEPRESSION Overall doing better, but not sleeping great again.  Has not been using trazodone.  Asking for a benzo to help her sleep.  Counseled her that I do not Rx benzos for sleep and encouraged her to take trazodone which has already been Rx'ed.  Pt states no Si/HI and has had increased interest in things.    ROS: Per HPI  History  Smoking status  . Never Smoker   Smokeless tobacco  . Never Used    O:  Filed Vitals:   02/14/11 1451  BP: 146/70  Pulse: 49  Temp: 98.1 F (36.7 C)    Gen: NAD CV: RRR, no murmur Pulm: CTA bilat, no wheezes or crackles Abd: soft, NT Ext: Warm, no chronic skin changes, no edema   A/P: 65 y.o. female p/w f/u -See problem list -f/u in 2 months

## 2011-02-14 NOTE — Assessment & Plan Note (Signed)
Decreased metformin back to 500 qd from 1000 b/c pt did not tolerate GI side effects.  Next A1c due 12/12. No other changes at this time. Pt very opposed to adding other medicines (po or insulin)

## 2011-02-14 NOTE — Assessment & Plan Note (Signed)
Restart miralax and she was taking it previously, constipation likely b/c pt had stopped taking when having diarrhea from increased metformin.

## 2011-02-14 NOTE — Assessment & Plan Note (Signed)
BP slightly elevated to 146/70.  Had been stable and at goal so will wait for next visit to make any med changes.

## 2011-02-14 NOTE — Patient Instructions (Signed)
Everything looks pretty good. Decrease your metformin back to the 500mg  once per day.  We will recheck your A1c number in 2 months. Keep taking your blood pressure medicines.  We will recheck in at your next visit.   Come back and see me in 2 months.

## 2011-02-27 ENCOUNTER — Telehealth: Payer: Self-pay | Admitting: Family Medicine

## 2011-02-27 NOTE — Telephone Encounter (Signed)
Tried to call pt. Mail box number? Will fwd. To PCP (last Potassium level WNL) When should pt have labs again? Thank you, .Mauricia Area

## 2011-02-27 NOTE — Telephone Encounter (Signed)
Mallory Hayes would like to speak to someone about when her last Potassium level was drawn and if she should have it done again soon.  She wants to be able to stop taking the Potassium.

## 2011-03-01 ENCOUNTER — Other Ambulatory Visit (HOSPITAL_COMMUNITY): Payer: Self-pay | Admitting: Family Medicine

## 2011-03-01 DIAGNOSIS — Z1231 Encounter for screening mammogram for malignant neoplasm of breast: Secondary | ICD-10-CM

## 2011-03-01 NOTE — Telephone Encounter (Signed)
Have her keep taking the potassium until her next f/u in 2 months.  We will recheck it then and if it's still normal can do a trial off of it.

## 2011-03-01 NOTE — Telephone Encounter (Signed)
Left message for pt to call back. Please see message. [name], [name]

## 2011-03-01 NOTE — Telephone Encounter (Signed)
Pt called back. Informed of Dr.McGill's advise. Pt agreed. [name], [name]

## 2011-04-05 ENCOUNTER — Ambulatory Visit (INDEPENDENT_AMBULATORY_CARE_PROVIDER_SITE_OTHER): Payer: Self-pay | Admitting: Family

## 2011-04-05 ENCOUNTER — Other Ambulatory Visit (HOSPITAL_COMMUNITY): Admission: RE | Admit: 2011-04-05 | Payer: Self-pay | Source: Ambulatory Visit | Admitting: Obstetrics & Gynecology

## 2011-04-05 ENCOUNTER — Encounter: Payer: Self-pay | Admitting: Family

## 2011-04-05 DIAGNOSIS — Z01419 Encounter for gynecological examination (general) (routine) without abnormal findings: Secondary | ICD-10-CM

## 2011-04-05 NOTE — Progress Notes (Signed)
  Subjective:    Mallory Hayes is a 65 y.o. female who presents for an annual exam. The patient has no complaints today. The patient is not currently sexually active. GYN screening history: last pap: was normal. The patient wears seatbelts: yes. The patient participates in regular exercise: not asked. Has the patient ever been transfused or tattooed?: no. The patient reports that there is not domestic violence in her life. Pt is here with concern about mother being diagnosed with ovarian cancer in past year at 65 yo.  Reports history of partial hysterectomy, unsure which ovary remains.    Menstrual History: OB History    Grav Para Term Preterm Abortions TAB SAB Ect Mult Living   0 0 0 0 0 0 0 0 0 0        No LMP recorded. Patient is postmenopausal.     Review of Systems Pertinent items are noted in HPI.    Objective:    BP 136/74  Pulse 60  Temp(Src) 97.1 F (36.2 C) (Oral)  Ht 5' (1.524 m)  Wt 119 lb 8 oz (54.205 kg)  BMI 23.34 kg/m2  General Appearance:    Alert, cooperative, no distress, appears stated age  Head:    Normocephalic, without obvious abnormality, atraumatic  Eyes:    PERRL, conjunctiva/corneas clear, EOM's intact  Ears:  Not examined  Nose:   Not exa mined  Throat:   Not examined  Neck:   Supple, symmetrical, trachea midline, no adenopathy;    thyroid:  no enlargement/tenderness/nodules  Back:     Symmetric, no curvature, ROM normal, no CVA tenderness  Lungs:     Clear to auscultation bilaterally, respirations unlabored  Chest Wall:    No tenderness or deformity   Heart:    Regular rate and rhythm, S1 and S2 normal, no murmur, rub   or gallop  Breast Exam:    No tenderness, masses, or nipple abnormality  Abdomen:     Soft, non-tender, bowel sounds active all four quadrants,    no masses, no organomegaly  Genitalia:    Normal female without lesion, discharge or tenderness; vaginal atrophy; vaginal cuff intact      Extremities:   Extremities normal,  atraumatic, no cyanosis or edema  Pulses:     Skin:   Skin color, texture, turgor normal, no rashes or lesions    Assessment:    Healthy female exam.  Family History of Ovarian Cancer   Plan:     Abdominal ultrasound. All questions answered. Await pap smear results. Breast self exam technique reviewed and patient encouraged to perform self-exam monthly. Follow up in 4 weeks. Mammogram. Pelvic ultrasound.   [facility]

## 2011-04-05 NOTE — Patient Instructions (Addendum)
Abdominal or Pelvic Ultrasound Ultrasound uses harmless sound waves instead of X-rays to take pictures of the inside of your body. A probe or wand device (transducer) is held up against your body to capture these pictures. The continually changing images can be recorded on videotape or film. Diagnostic ultrasound imaging is commonly called sonography or ultrasonography. There are different types of ultrasound exams. An ultrasound of the gallbladder, liver, and pancreas can show gallstones, masses, cysts, inflammation, infection, or enlarged organs. An ultrasound of the kidneys can show cysts, masses, kidney stones, and kidney size and shape. A pelvic ultrasound can show the uterus, ovaries, and cysts or masses. An obstetrical ultrasound shows the position of the fetus, measurements for maturity, fetal heartbeat, and fetal organs. A breast, thyroid, or testicular ultrasound can show if a nodule is solid or cystic. RISKS AND COMPLICATIONS Ultrasound has been used for many years and has never shown any harmful effects. Studies in humans have shown no direct link between the use of ultrasound and adverse outcomes. BEFORE THE PROCEDURE Other than drinking water, do not eat or drink for 8 to 12 hours before the test or as directed by your caregiver. Follow any other diet instructions from your caregiver. If you are having a pelvic ultrasound, you may need to drink a lot of liquid before the exam. A full bladder helps to see the organs behind the bladder better. PROCEDURE   There is no pain in an ultrasound exam. A gel is applied to your skin, and the transducer is then placed on the area to be examined. The gel may feel cool. The gel wipes off easily, but it is a good idea to wear clothing that is easily washable. The images from inside your body are displayed on one or more monitors that look like small television screens. The returning sound waves produce pictures of the organs that were in the path of the  sound sent from the transducer. The room is usually darkened during the exam. This makes it easier to see the images on the monitor. The ultrasound exam should take less than 1 hour. AFTER THE PROCEDURE You can safely drive home and return to regular activities immediately after the exam. Ask when your test results will be ready. Make sure you get your test results. Document Released: 04/13/2000 Document Revised: 12/27/2010 Document Reviewed: 10/05/2010 [facility] Patient Information 2012 [place]. Place postmenopausal annual exam patient instructions here.

## 2011-04-08 ENCOUNTER — Emergency Department (HOSPITAL_COMMUNITY)
Admission: EM | Admit: 2011-04-08 | Discharge: 2011-04-08 | Disposition: A | Discharge status: Home / Self Care | Payer: Self-pay

## 2011-04-09 ENCOUNTER — Ambulatory Visit (INDEPENDENT_AMBULATORY_CARE_PROVIDER_SITE_OTHER): Payer: Self-pay | Admitting: Family Medicine

## 2011-04-09 ENCOUNTER — Encounter: Payer: Self-pay | Admitting: Family Medicine

## 2011-04-09 VITALS — BP 110/60 | HR 54 | Temp 97.6°F | Ht 61.0 in | Wt 116.0 lb

## 2011-04-09 DIAGNOSIS — E119 Type 2 diabetes mellitus without complications: Secondary | ICD-10-CM

## 2011-04-09 DIAGNOSIS — K5909 Other constipation: Secondary | ICD-10-CM

## 2011-04-09 LAB — POCT GLYCOSYLATED HEMOGLOBIN (HGB A1C): Hemoglobin A1C: 7.4

## 2011-04-09 MED ORDER — METFORMIN HCL ER (MOD) 500 MG PO TB24: 1000.0000 mg | ORAL_TABLET | Freq: Every day | ORAL | Status: DC

## 2011-04-09 NOTE — Assessment & Plan Note (Signed)
Urged to restart daily miralax with high fiber diet or fiber supplement.  Encouraged Senokot for prn use.

## 2011-04-09 NOTE — Progress Notes (Signed)
  Subjective:    Patient ID: Mallory Hayes, female    DOB: [birth year], 65 y.o.   MRN: [id number]  HPIWork in appt for constipation  Constipation:  Has chronic constipation.   most recent episode 4 days ago.  Notes triggered by stopping taking her regular miralax. bowel movements every 2-3 days.  Does not take fiber supplements or other OTC meds.     Notes no fever chills, abdominal, blood in stools, diarrhea, nausea, vomiting, emesis, weight loss,  Per patient last colonoscopy 2 years ago, states she is not due for another yet.  DIABETES  Taking and tolerating: Metformin 500mg  Fasting blood sugars:not checking Hypoglycemic symptoms: no Visual problems: no Monitoring feet: yes, had a posiatrist as well.  Numbness/Tingling: no Last eye exam: over 2 years ago Last a1c: 7.4 Diabetic Labs:  Lab Results  Component Value Date   HGBA1C 7.4 01/09/2011   HGBA1C 7.4 09/28/2010   HGBA1C 7.0* 05/04/2010   Lab Results  Component Value Date   MICROALBUR 0.95 08/12/2009   [place] 85 09/28/2010   CREATININE 1.08 09/28/2010   Last microalbumin: Lab Results  Component Value Date   MICROALBUR 0.95 08/12/2009     Review of Systemssee HPI   Objective:   Physical Exam GEN: Alert & Oriented, No acute distress CV:  Regular Rate & Rhythm, no murmur Respiratory:  Normal work of breathing, CTAB Abd:  + BS, soft, no tenderness to palpation        Assessment & Plan:

## 2011-04-09 NOTE — Patient Instructions (Signed)
Constipation in Adults Constipation is having fewer than 2 bowel movements per week. Usually, the stools are hard. As we grow older, constipation is more common. If you try to fix constipation with laxatives, the problem may get worse. This is because laxatives taken over a long period of time make the colon muscles weaker. A low-fiber diet, not taking in enough fluids, and taking some medicines may make these problems worse. MEDICATIONS THAT MAY CAUSE CONSTIPATION  Water pills (diuretics).     Calcium channel blockers (used to control blood pressure and for the heart).     Certain pain medicines (narcotics).     Anticholinergics.    Anti-inflammatory agents.     Antacids that contain aluminum.  DISEASES THAT CONTRIBUTE TO CONSTIPATION  Diabetes.     Parkinson's disease.     Dementia.    Stroke.    Depression.    Illnesses that cause problems with salt and water metabolism.  HOME CARE INSTRUCTIONS    Constipation is usually best cared for without medicines. Increasing dietary fiber and eating more fruits and vegetables is the best way to manage constipation.     Slowly increase fiber intake to 25 to 38 grams per day. Whole grains, fruits, vegetables, and legumes are good sources of fiber. A dietitian can further help you incorporate high-fiber foods into your diet.     Drink enough water and fluids to keep your urine clear or pale yellow.     A fiber supplement may be added to your diet if you cannot get enough fiber from foods.     Increasing your activities also helps improve regularity.     Suppositories, as suggested by your caregiver, will also help. If you are using antacids, such as aluminum or calcium containing products, it will be helpful to switch to products containing magnesium if your caregiver says it is okay.     If you have been given a liquid injection (enema) today, this is only a temporary measure. It should not be relied on for treatment of longstanding  (chronic) constipation.     Stronger measures, such as magnesium sulfate, should be avoided if possible. This may cause uncontrollable diarrhea. Using magnesium sulfate may not allow you time to make it to the bathroom.  SEEK IMMEDIATE MEDICAL CARE IF:    There is bright red blood in the stool.     The constipation stays for more than 4 days.     There is belly (abdominal) or rectal pain.     You do not seem to be getting better.     You have any questions or concerns.  MAKE SURE YOU:    Understand these instructions.     Will watch your condition.     Will get help right away if you are not doing well or get worse.  Document Released: 01/13/2004 Document Revised: 12/27/2010 Document Reviewed: 12/03/2007 [facility] Patient Information 2012 [place].High Fiber Diet A high fiber diet changes your normal diet to include more whole grains, legumes, fruits, and vegetables. Changes in the diet involve replacing refined carbohydrates with unrefined foods. The calorie level of the diet is essentially unchanged. The Dietary Reference Intake (recommended amount) for adult males is 38 g per day. For adult females, it is 25 g per day. Pregnant and lactating women should consume 28 g of fiber per day. Fiber is the intact part of a plant that is not broken down during digestion. Functional fiber is fiber that has been isolated  from the plant to provide a beneficial effect in the body. PURPOSE  Increase stool bulk.     Ease and regulate bowel movements.     Lower cholesterol.  INDICATIONS THAT YOU NEED MORE FIBER  Constipation and hemorrhoids.     Uncomplicated diverticulosis (intestine condition) and irritable bowel syndrome.     Weight management.     As a protective measure against hardening of the arteries (atherosclerosis), diabetes, and cancer.  NOTE OF CAUTION If you have a digestive or bowel problem, ask your caregiver for advice before adding high fiber foods to your diet.  Some of the following medical problems are such that a high fiber diet should not be used without consulting your caregiver:  Acute diverticulitis (intestine infection).     Partial small bowel obstructions.     Complicated diverticular disease involving bleeding, rupture (perforation), or abscess (boil, furuncle).     Presence of autonomic neuropathy (nerve damage) or gastric paresis (stomach cannot empty itself).  GUIDELINES FOR INCREASING FIBER  Start adding fiber to the diet slowly. A gradual increase of about 5 more grams (2 slices of whole-wheat bread, 2 servings of most fruits or vegetables, or 1 bowl of high fiber cereal) per day is best. Too rapid an increase in fiber may result in constipation, flatulence, and bloating.     Drink enough water and fluids to keep your urine clear or pale yellow. Water, juice, or caffeine-free drinks are recommended. Not drinking enough fluid may cause constipation.     Eat a variety of high fiber foods rather than one type of fiber.     Try to increase your intake of fiber through using high fiber foods rather than fiber pills or supplements that contain small amounts of fiber.     The goal is to change the types of food eaten. Do not supplement your present diet with high fiber foods, but replace foods in your present diet.  INCLUDE A VARIETY OF FIBER SOURCES  Replace refined and processed grains with whole grains, canned fruits with fresh fruits, and incorporate other fiber sources. White rice, white breads, and most bakery goods contain little or no fiber.     Brown whole-grain rice, buckwheat oats, and many fruits and vegetables are all good sources of fiber. These include: broccoli, Brussels sprouts, cabbage, cauliflower, beets, sweet potatoes, white potatoes (skin on), carrots, tomatoes, eggplant, squash, berries, fresh fruits, and dried fruits.     Cereals appear to be the richest source of fiber. Cereal fiber is found in whole grains and  bran. Bran is the fiber-rich outer coat of cereal grain, which is largely removed in refining. In whole-grain cereals, the bran remains. In breakfast cereals, the largest amount of fiber is found in those with "bran" in their names. The fiber content is sometimes indicated on the label.     You may need to include additional fruits and vegetables each day.     In baking, for 1 cup white flour, you may use the following substitutions:     1 cup whole-wheat flour minus 2 tbs.      cup white flour plus  cup whole-wheat flour.  Document Released: 04/16/2005 Document Revised: 12/27/2010 Document Reviewed: 02/22/2009 [facility] Patient Information 2012 [place].

## 2011-04-09 NOTE — Assessment & Plan Note (Addendum)
A1c continues to be above goal.  Patient declines further DM education- states she is currently noncompliant and is meaning to call DM center back.    Was previously not tolerate to metformin 500 bid due to Gi side effects so is only taking qday.  Will try metformin ER- sent to [facility] for the $4 list.

## 2011-04-10 ENCOUNTER — Ambulatory Visit (HOSPITAL_COMMUNITY): Payer: Self-pay

## 2011-04-10 ENCOUNTER — Ambulatory Visit (HOSPITAL_COMMUNITY)
Admission: RE | Admit: 2011-04-10 | Discharge: 2011-04-10 | Disposition: A | Discharge status: Home / Self Care | Payer: Self-pay | Source: Ambulatory Visit | Attending: Obstetrics & Gynecology | Admitting: Obstetrics & Gynecology

## 2011-04-10 DIAGNOSIS — N949 Unspecified condition associated with female genital organs and menstrual cycle: Secondary | ICD-10-CM | POA: Insufficient documentation

## 2011-04-10 DIAGNOSIS — Z01419 Encounter for gynecological examination (general) (routine) without abnormal findings: Secondary | ICD-10-CM

## 2011-04-10 DIAGNOSIS — Z90710 Acquired absence of both cervix and uterus: Secondary | ICD-10-CM | POA: Insufficient documentation

## 2011-04-13 ENCOUNTER — Ambulatory Visit (HOSPITAL_COMMUNITY)
Admission: RE | Admit: 2011-04-13 | Discharge: 2011-04-13 | Disposition: A | Discharge status: Home / Self Care | Payer: Self-pay | Source: Ambulatory Visit | Attending: Family Medicine | Admitting: Family Medicine

## 2011-04-13 DIAGNOSIS — Z1231 Encounter for screening mammogram for malignant neoplasm of breast: Secondary | ICD-10-CM | POA: Insufficient documentation

## 2011-04-17 ENCOUNTER — Telehealth: Payer: Self-pay | Admitting: Family Medicine

## 2011-04-17 NOTE — Telephone Encounter (Signed)
Will fwd. To Dr.McGill for advise. [name], [name]

## 2011-04-17 NOTE — Telephone Encounter (Signed)
Pt has been having trouble sleeping when she takes potassium, wants to know if she should continue it?

## 2011-04-18 NOTE — Telephone Encounter (Signed)
Left message for patient to return call, see message from Dr Loraine Maple.[place], Mallory Hayes

## 2011-04-18 NOTE — Telephone Encounter (Signed)
She should continue it.  I'm unsure if it is actually related to her sleep issues.  Please have her come in for an appt so we can recheck blood work and talk more about this issue. Thanks!

## 2011-04-20 ENCOUNTER — Other Ambulatory Visit: Payer: Self-pay | Admitting: Family Medicine

## 2011-04-20 DIAGNOSIS — N951 Menopausal and female climacteric states: Secondary | ICD-10-CM

## 2011-04-20 MED ORDER — METFORMIN HCL ER (MOD) 1000 MG PO TB24: 1000.0000 mg | ORAL_TABLET | Freq: Every day | ORAL | Status: DC

## 2011-04-20 MED ORDER — METFORMIN HCL ER (OSM) 1000 MG PO TB24: 1000.0000 mg | ORAL_TABLET | Freq: Every day | ORAL | Status: DC

## 2011-04-25 ENCOUNTER — Other Ambulatory Visit: Payer: Self-pay | Admitting: Family Medicine

## 2011-04-25 ENCOUNTER — Telehealth: Payer: Self-pay | Admitting: *Deleted

## 2011-04-25 MED ORDER — METFORMIN HCL ER 500 MG PO TB24: 1000.0000 mg | ORAL_TABLET | Freq: Every day | ORAL | Status: DC

## 2011-04-25 NOTE — Telephone Encounter (Signed)
Pt is asking that her Rx for Metformin XR be sent to [facility]. [facility].Mallory Hayes

## 2011-04-25 NOTE — Telephone Encounter (Signed)
Called pt to transfer her Rx. She will do it. [name], [name]

## 2011-05-17 ENCOUNTER — Ambulatory Visit: Payer: Self-pay | Admitting: Family Medicine

## 2011-06-06 ENCOUNTER — Ambulatory Visit (INDEPENDENT_AMBULATORY_CARE_PROVIDER_SITE_OTHER): Payer: Self-pay | Admitting: Family Medicine

## 2011-06-06 ENCOUNTER — Encounter: Payer: Self-pay | Admitting: Family Medicine

## 2011-06-06 DIAGNOSIS — F339 Major depressive disorder, recurrent, unspecified: Secondary | ICD-10-CM

## 2011-06-06 DIAGNOSIS — E7800 Pure hypercholesterolemia, unspecified: Secondary | ICD-10-CM

## 2011-06-06 DIAGNOSIS — I10 Essential (primary) hypertension: Secondary | ICD-10-CM

## 2011-06-06 DIAGNOSIS — E119 Type 2 diabetes mellitus without complications: Secondary | ICD-10-CM

## 2011-06-06 NOTE — Assessment & Plan Note (Signed)
Started taking Omega-3, tolerating well.  Needs to come back for FLP.

## 2011-06-06 NOTE — Progress Notes (Signed)
S: Pt comes in today for f/u.  HYPERTENSION BP: 124/82 Meds: norvasc, accuretic, k-dur Taking meds: Yes     # of doses missed/week: 0 Symptoms: Headache: No Dizziness: No Vision changes: No SOB:  No Chest pain: No LE swelling: No Tobacco use: No   DIABETES Home CBGs: doesn't check Meds: metformin Taking Meds: yes # of doses missed per week: 0 Hypoglycemic episodes?: no Last foot exam: TODAY Last eye exam: time now Symptoms: Polyuria: no Polydipsia: no Parasthesias: no   Dizziness: no  Nausea:  no Vomiting:  no Last A1c:  Lab Results  Component Value Date   HGBA1C 7.4 04/09/2011      DEPRESSION Doing better overall.  Has occasional crying spells that "come out of no where."  Happen about 1x/month, last for about a day. Back at [facility] and interactive, enjoy being out and with friends.  Sleeping well, not needing Trazodone.  Still misses mom at times and feels lonely.  No SI/HI.  Likes helping other people.  Was on an antidepressant "many years" ago (?trazine) for anxiety.  Has never taken an "every day" medicine for depression.  Is interested in counseling.  Went to [facility] last week and really enjoyed it.    PHQ-9 score: 4  ROS: Per HPI  History  Smoking status  . Never Smoker   Smokeless tobacco  . Never Used    O:  Filed Vitals:   06/06/11 1400  BP: 124/82  Pulse: 68    Gen: NAD CV: RRR, no murmur Pulm: CTA bilat, no wheezes or crackles Ext: Warm, no chronic skin changes, no edema FOOT EXAM: no ulcers or calluses, sensation intact bilateral feet, no sensory deficits, full ROM, toes warm   A/P: 66 y.o. female p/w DM, HTN, depression -See problem list -f/u in 1-2 months

## 2011-06-06 NOTE — Assessment & Plan Note (Signed)
Overall doing well.  Went to [employer] [facility] [employer] last week and really enjoyed it.  Will f/u with Dr. Gwenlyn Saran.  Declines SSRI at this time, only wants to take something PRN if she were to start something, so we will try therapy first.

## 2011-06-06 NOTE — Assessment & Plan Note (Signed)
BP acceptable today.  Will have pt return for BMET to be drawn. Continue current txt.

## 2011-06-06 NOTE — Assessment & Plan Note (Signed)
A1c elevated, pt tolerating ER metformin at this time.  Will continue.  Next A1c in 06/2011.

## 2011-06-06 NOTE — Patient Instructions (Addendum)
It was great to see you today! I recommend that you meet with our psychologist, Dr. Zella Ball, for help dealing with your depression, lonliness.  You can schedule an appointment with her by calling her directly at [phone]. I'm glad that you are tolerating the new metformin.  Make sure you are taking this and your blood pressure medicines every day. Come back to have some labs drawn.  You can call the front office and schedule a "lab only" appointment.  I'll send you a letter with the results.  Come back and see me in about 2 months, so we can check your A1c and see how your mood is doing.

## 2011-07-06 ENCOUNTER — Ambulatory Visit (INDEPENDENT_AMBULATORY_CARE_PROVIDER_SITE_OTHER): Payer: Self-pay | Admitting: Family Medicine

## 2011-07-06 VITALS — BP 144/69 | HR 60 | Temp 98.3°F | Ht 61.0 in | Wt 119.0 lb

## 2011-07-06 DIAGNOSIS — B349 Viral infection, unspecified: Secondary | ICD-10-CM | POA: Insufficient documentation

## 2011-07-06 DIAGNOSIS — E119 Type 2 diabetes mellitus without complications: Secondary | ICD-10-CM

## 2011-07-06 DIAGNOSIS — B9789 Other viral agents as the cause of diseases classified elsewhere: Secondary | ICD-10-CM

## 2011-07-06 NOTE — Patient Instructions (Signed)
Viremia Your exam shows you have a viral illness. Viremia means your symptoms are due to the presence of the virus in your blood. This will often cause a chill or sweat. Other common symptoms of viral infections include fever, muscle aches, headache, fatigue, stomach upsets, sore throat, and dry cough. Antibiotics are not effective in viral illnesses; they are usually only given when there is a secondary bacterial infection. General treatment includes bed rest, increasing oral fluid intake of clear, non-caffeinated drinks like ginger ale, fruit juices, water, or sports drinks. Medicines to relieve specific symptoms such as cough, pain, or diarrhea may also be prescribed. Only take over-the-counter or prescription medicines for pain, discomfort, or fever as directed by your caregiver.  Please call your doctor if you are not better after 2 to 3 days of symptom treatment. Call or return here right away if your illness gets more severe, or you develop any other new symptoms, such as a fever above 103 F (39.4 C), vomiting for more than a day, severe headache or other pain, stiff neck, trouble breathing, visual problems, "blackouts" or fainting. Document Released: 05/24/2004 Document Revised: 04/05/2011 Document Reviewed: 04/16/2005 [facility] Patient Information 2012 [place].  Make a follow up appointment with your regular doctor next week to talk about your A1C.

## 2011-07-06 NOTE — Progress Notes (Signed)
  Subjective:    Patient ID: Mallory Hayes, female    DOB: 06-25-1945, 66 y.o.   MRN: [id number]  HPI Pt presents today with viral symptoms. Symptoms have been present for 4 days . Symptoms include nasal congestion, rhinorrhea, headache, muscle aches, generalized malaise. + subjective fevers at home. PO intake and UOP have been at baseline. No  Rash, no sore throat. + Sick contacts as pt went to major conference last week.  Pt has had flu shot. Pt is a nonsmoker     Review of Systems See HPI, otherwise ROS negative     Objective:   Physical Exam Gen: up in chair, NAD HEENT: NCAT, EOMI, TMs clear bilaterally, +nasal erythema, rhinorrhea bilaterally, + post oropharyngeal erythema  CV: RRR, no murmurs auscultated PULM: CTAB, no wheezes, rales, rhoncii ABD: S/NT/+ bowel sounds  EXT: 2+ peripheral pulses         Assessment & Plan:

## 2011-07-06 NOTE — Assessment & Plan Note (Signed)
A1C today. Pt instructed to follow up with PCP next week.

## 2011-07-06 NOTE — Assessment & Plan Note (Signed)
Flu vs. Flu like illness. Discussed supportive care and infectious red flags. Pseudophed product and NSAID avoidance in the setting of hypertension. Infectious red flags reviewed. Handout given. Will follow prn.

## 2011-07-19 ENCOUNTER — Telehealth: Payer: Self-pay | Admitting: Family Medicine

## 2011-07-19 NOTE — Telephone Encounter (Signed)
Called to clarify with patient.  Pharmacy does not currently have any accuretic in stock, and stated they would call when they had it.  Encouraged pt to take Rx to a different pharmacy to have it filled rather than taking her old HCTZ pills.  Patient stated understanding and agreement and will try to have it filled at a different pharmacy.

## 2011-07-19 NOTE — Telephone Encounter (Signed)
Will fwd. To Dr.McGill for advise. Mallory Hayes, [name]

## 2011-07-19 NOTE — Telephone Encounter (Signed)
Pharmacy does not have the new BP medication in, she has 5 of her HCTZ and wants to know if she can finish those until her new meds come in.  She has gone 2 days without the new meds.  She would appreciate a call today if at all possible.

## 2011-08-03 ENCOUNTER — Telehealth: Payer: Self-pay | Admitting: *Deleted

## 2011-08-03 DIAGNOSIS — I10 Essential (primary) hypertension: Secondary | ICD-10-CM

## 2011-08-03 MED ORDER — QUINAPRIL-HYDROCHLOROTHIAZIDE 20-12.5 MG PO TABS: 2.0000 | ORAL_TABLET | Freq: Every day | ORAL | Status: DC

## 2011-08-07 ENCOUNTER — Ambulatory Visit (INDEPENDENT_AMBULATORY_CARE_PROVIDER_SITE_OTHER): Payer: Self-pay | Admitting: Family Medicine

## 2011-08-07 ENCOUNTER — Other Ambulatory Visit: Payer: Self-pay | Admitting: Family Medicine

## 2011-08-07 ENCOUNTER — Encounter: Payer: Self-pay | Admitting: Family Medicine

## 2011-08-07 VITALS — BP 132/74 | HR 60 | Ht 61.0 in | Wt 118.5 lb

## 2011-08-07 DIAGNOSIS — M79652 Pain in left thigh: Secondary | ICD-10-CM

## 2011-08-07 DIAGNOSIS — M7750 Other enthesopathy of unspecified foot: Secondary | ICD-10-CM

## 2011-08-07 DIAGNOSIS — M79609 Pain in unspecified limb: Secondary | ICD-10-CM

## 2011-08-07 DIAGNOSIS — M7741 Metatarsalgia, right foot: Secondary | ICD-10-CM

## 2011-08-07 DIAGNOSIS — I10 Essential (primary) hypertension: Secondary | ICD-10-CM

## 2011-08-07 MED ORDER — AMLODIPINE BESYLATE 10 MG PO TABS: 10.0000 mg | ORAL_TABLET | Freq: Every day | ORAL | Status: DC

## 2011-08-07 MED ORDER — TRAMADOL HCL 50 MG PO TABS: 50.0000 mg | ORAL_TABLET | Freq: Three times a day (TID) | ORAL | Status: AC | PRN

## 2011-08-07 NOTE — Assessment & Plan Note (Signed)
Left thigh pain. I think this is simply a muscle overuse injury.  Patient was favoring her right foot and had an increase in her normal level of activity.  Plan to instruct patient to resume a normal gait, and take tramadol as needed for pain. If her pain persists will followup with sports medicine Center.  His expresses understanding

## 2011-08-07 NOTE — Progress Notes (Signed)
Mallory Hayes is a 66 y.o. female who presents to [facility] today for   1) right foot pain present for about one week. Patient experiences pain in the ball of her foot. She thinks the pain may have started after she wore 3 pairs of thick socks and her foot was somewhat compressed in her shoe. She notes continual pain in the ball of her foot especially with walking. She thinks the pain is improving. She has never had pain like this before and has used Tylenol which hasn't helped very much. She denies any numbness or tingling along her toes  2) left hip pain: Starting yesterday after weeding her garden.  She noted she was trying to favor her right foot and may have been walking funny on her left side.  She notes pain on the lateral aspect of her left thigh that radiates to her left knee. She denies any weakness or numbness. She has been taking Tylenol which has helped only a little. She's never had something like this happen before.   PMH, SH reviewed: Significant for diabetes ROS as above otherwise neg. No Chest pain, palpitations, SOB, Fever, Chills, Abd pain, N/V/D.  Medications reviewed. Current Outpatient Prescriptions  Medication Sig Dispense Refill  . amLODipine (NORVASC) 10 MG tablet Take 1 tablet (10 mg total) by mouth daily.  90 tablet  3  . aspirin 81 MG tablet Take 81 mg by mouth daily.        . metFORMIN (GLUCOPHAGE-XR) 500 MG 24 hr tablet Take 2 tablets (1,000 mg total) by mouth daily with breakfast.  60 tablet  3  . pantoprazole (PROTONIX) 40 MG tablet Take 1 tablet (40 mg total) by mouth daily.  90 tablet  3  . polyethylene glycol powder (GLYCOLAX/MIRALAX) powder Take 17 g by mouth daily.  255 g  0  . potassium chloride SA (K-DUR,KLOR-CON) 20 MEQ tablet Take 1 tablet (20 mEq total) by mouth daily.  90 tablet  3  . quinapril-hydrochlorothiazide (ACCURETIC) 20-12.5 MG per tablet Take 2 tablets by mouth daily.  60 tablet  0  . tolterodine (DETROL LA) 4 MG 24 hr capsule Take 1 capsule (4 mg  total) by mouth daily.  90 capsule  3  . traMADol (ULTRAM) 50 MG tablet Take 1 tablet (50 mg total) by mouth every 8 (eight) hours as needed for pain.  30 tablet  0  . traZODone (DESYREL) 50 MG tablet Take 1 tablet (50 mg total) by mouth at bedtime.  90 tablet  3  . vitamin E 200 UNIT capsule Take 200 Units by mouth daily.          Exam:  BP 132/74  Pulse 60  Ht 5\' 1"  (1.549 m)  Wt 118 lb 8 oz (53.751 kg)  BMI 22.39 kg/m2 Gen: Well NAD, very active and pleasant  woman MSK: Foot: Right foot is normal appearing. Mildly tender to palpation along the second and third metatarsal heads. Negative squeeze test. Normal toe motion. Back: Nontender over spinal midline. Nontender over her SI joints or gluteus  Hip: Normal hip range of motion and strength. Nontender over the greater trochanter. Mildly tender over the muscles of the lateral quadriceps.   Gait: Relatively normal

## 2011-08-07 NOTE — Assessment & Plan Note (Signed)
Patient has metatarsalgia of the right foot. It seems to be improving on its own. I do not have metatarsal pads in clinic today to correct her shoe.  Plan to refer to sports medicine if the symptoms continue. Patient expresses understanding. I provided a large patient handout on this issue

## 2011-08-07 NOTE — Patient Instructions (Signed)
Thank you for coming in today. You have two issues:  1) Metatarsalgia: Pain of the foot bones. Make sure you wear cushioned shoes. Take pain medicines as needed.  Make an appointment with [facility] for temporary orthotics. This will help a lot.  2) Muscle Pain: In your left hip. This is because you were trying to favor your right leg you hurt your left leg.  Try to resume your normal activities and stay active..  Consider applying ice to your hip as needed.   You will get better on your own.  Come back or go to the emergency room if you notice new weakness new numbness problems walking or bowel or bladder problems.

## 2011-08-10 ENCOUNTER — Ambulatory Visit (INDEPENDENT_AMBULATORY_CARE_PROVIDER_SITE_OTHER): Payer: Self-pay | Admitting: Family Medicine

## 2011-08-10 VITALS — BP 148/75

## 2011-08-10 DIAGNOSIS — M7750 Other enthesopathy of unspecified foot: Secondary | ICD-10-CM

## 2011-08-10 DIAGNOSIS — M7741 Metatarsalgia, right foot: Secondary | ICD-10-CM

## 2011-08-10 NOTE — Progress Notes (Signed)
  Subjective:    Patient ID: Mallory Hayes, female    DOB: [birth year], 66 y.o.   MRN: [id number]  HPI Mallory Hayes was sent over as a referral from the family practice center for assessment of right foot pain.  She was seen at the [facility], dx metatarsalgia.  Pain has been present for 1 weeks, worse under R 2nd and 3rd MT heads.  No numbness/tingling.  Diabetes under good control, last a1c in the 7's.  Past medical history, surgical history, family history, social history, allergies, and medications reviewed from the medical record and no changes needed.  Review of Systems    No fevers, chills, night sweats, weight loss, chest pain, or shortness of breath.  Objective:   Physical Exam General:  Well developed, well nourished, and in no acute distress. Neuro:  Alert and oriented x3, extra-ocular muscles intact. Skin: Warm and dry, no rashes noted. Respiratory:  Not using accessory muscles, speaking in full sentences. Musculoskeletal: Right foot inspection and palpation reveals breakdown of the transverse arch and a drop of MT heads. Abnormal callous is present:under the second and third metatarsal heads. Hammer toes: none. TTP: under the second and third metatarsal heads. Bunionette present.  Leg lengths equal, gait normal. Sensation intact distally.    Assessment & Plan:

## 2011-08-10 NOTE — Assessment & Plan Note (Signed)
Sports insoles with metatarsal pads placed. Patient ambulated and these were comfortable. She will come back to see [place] on an as-needed basis.

## 2011-08-14 ENCOUNTER — Other Ambulatory Visit: Payer: Self-pay

## 2011-08-14 DIAGNOSIS — I10 Essential (primary) hypertension: Secondary | ICD-10-CM

## 2011-08-14 DIAGNOSIS — E119 Type 2 diabetes mellitus without complications: Secondary | ICD-10-CM

## 2011-08-14 DIAGNOSIS — E7800 Pure hypercholesterolemia, unspecified: Secondary | ICD-10-CM

## 2011-08-14 LAB — BASIC METABOLIC PANEL
CO2: 29 mEq/L (ref 19–32)
Glucose, Bld: 85 mg/dL (ref 70–99)
Potassium: 3.4 mEq/L — ABNORMAL LOW (ref 3.5–5.3)
Sodium: 138 mEq/L (ref 135–145)

## 2011-08-14 NOTE — Progress Notes (Signed)
BMP AND D LDL DONE TODAY Mayanna Garlitz 

## 2011-08-16 ENCOUNTER — Encounter: Payer: Self-pay | Admitting: Family Medicine

## 2011-08-16 ENCOUNTER — Ambulatory Visit (INDEPENDENT_AMBULATORY_CARE_PROVIDER_SITE_OTHER): Payer: Self-pay | Admitting: Family Medicine

## 2011-08-16 DIAGNOSIS — B9789 Other viral agents as the cause of diseases classified elsewhere: Secondary | ICD-10-CM

## 2011-08-16 DIAGNOSIS — J309 Allergic rhinitis, unspecified: Secondary | ICD-10-CM

## 2011-08-16 DIAGNOSIS — B349 Viral infection, unspecified: Secondary | ICD-10-CM

## 2011-08-16 MED ORDER — FLUTICASONE PROPIONATE 50 MCG/ACT NA SUSP: 2.0000 | Freq: Every day | NASAL | Status: DC

## 2011-08-16 MED ORDER — FLUTICASONE PROPIONATE 50 MCG/ACT NA SUSP: 1.0000 | Freq: Every day | NASAL | Status: DC

## 2011-08-16 NOTE — Patient Instructions (Signed)
Thank you for coming in today, it was good to see you I would like for you to try an over the counter antihistamine such as allegra, zyrtec or claritin I have sent over a prescription of a steroid nose spray to see if this helps with your symptoms

## 2011-08-19 NOTE — Assessment & Plan Note (Signed)
Patient with history of AR and symptoms seem to have lingered too long to be continued viral process.  Symptoms more consistent with AR. Will give a trial of flonase and recommend antihistamine to see if this improves symptoms

## 2011-08-19 NOTE — Progress Notes (Signed)
  Subjective:    Patient ID: Mallory Hayes, female    DOB: 12-Feb-1946, 66 y.o.   MRN: [id number]  HPI  1. Congestion/Cough: Here with complaint of cough/congestion.  States this has been ongoing x3 weeks.  Initially diagnosed as a viral illness, it has improved some but still with persistent cough and itchy stuffy nose.  Feels like she has drainage from her nose to her throat.  Her cough has been non productive and she states she has felt "feverish" but has not checked a temp at home.  She denies chills.  She has been using mucinex at home with minimal relief.  She does have a hx of seasonal allergies as a child and young adult.   Review of Systems  Constitutional: Negative for chills, activity change and appetite change.  HENT: Positive for congestion, rhinorrhea and sneezing. Negative for facial swelling.   Respiratory: Positive for cough. Negative for shortness of breath.   Cardiovascular: Negative for chest pain.       Objective:   Physical Exam  Constitutional: She is oriented to person, place, and time. She appears well-nourished. No distress.  HENT:  Head: Normocephalic and atraumatic.       Posterior oropharynx mildly injected.  No exudate seen.   Neck: Normal range of motion. Neck supple.  Cardiovascular: Normal rate, regular rhythm and normal heart sounds.   Pulmonary/Chest: Effort normal and breath sounds normal.  Musculoskeletal: She exhibits no edema.  Lymphadenopathy:    She has no cervical adenopathy.  Neurological: She is alert and oriented to person, place, and time.          Assessment & Plan:

## 2011-08-29 ENCOUNTER — Other Ambulatory Visit: Payer: Self-pay | Admitting: Family Medicine

## 2011-08-29 MED ORDER — MOMETASONE FUROATE 50 MCG/ACT NA SUSP: 2.0000 | Freq: Every day | NASAL | Status: DC

## 2011-11-13 ENCOUNTER — Telehealth: Payer: Self-pay | Admitting: Family Medicine

## 2011-11-13 ENCOUNTER — Other Ambulatory Visit: Payer: Self-pay | Admitting: Family Medicine

## 2011-11-13 MED ORDER — METFORMIN HCL ER 500 MG PO TB24: 1000.0000 mg | ORAL_TABLET | Freq: Every day | ORAL | Status: DC

## 2011-11-13 NOTE — Telephone Encounter (Signed)
Patient returned call and was scheduled an appointment with Dr Loraine Maple on 11/21/11 at 11:45 to follow up on diabetes.Mallory Hayes, [name]

## 2011-11-13 NOTE — Telephone Encounter (Signed)
Patient is calling to find out when she is due for her A1C.

## 2011-11-13 NOTE — Telephone Encounter (Signed)
Left message for patient to return call. She is due for her A1C, please have her schedule an appointment with PCP for F/U on diabetes and we can check her A1C at that time.Busick, [name]

## 2011-11-21 ENCOUNTER — Ambulatory Visit (INDEPENDENT_AMBULATORY_CARE_PROVIDER_SITE_OTHER): Payer: Self-pay | Admitting: Family Medicine

## 2011-11-21 ENCOUNTER — Encounter: Payer: Self-pay | Admitting: Family Medicine

## 2011-11-21 VITALS — BP 157/73 | HR 46 | Ht 61.0 in | Wt 118.0 lb

## 2011-11-21 DIAGNOSIS — I10 Essential (primary) hypertension: Secondary | ICD-10-CM

## 2011-11-21 DIAGNOSIS — Z23 Encounter for immunization: Secondary | ICD-10-CM

## 2011-11-21 DIAGNOSIS — E7800 Pure hypercholesterolemia, unspecified: Secondary | ICD-10-CM

## 2011-11-21 DIAGNOSIS — E119 Type 2 diabetes mellitus without complications: Secondary | ICD-10-CM

## 2011-11-21 LAB — POCT GLYCOSYLATED HEMOGLOBIN (HGB A1C): Hemoglobin A1C: 7.3

## 2011-11-21 NOTE — Assessment & Plan Note (Signed)
BP slightly elevated today, even on recheck.  Pt would like to recheck one more time before changing or increasing medications.  Will return for BMET. F/u BP in 1 month.  BP Readings from Last 3 Encounters:  11/21/11 157/73  08/16/11 119/66  08/10/11 148/75

## 2011-11-21 NOTE — Patient Instructions (Signed)
It was good to see you.  Please come back FASTING to have some labs drawn.  Since I would like to recheck your blood pressure in a few weeks, you can make these appointments for the same morning OR you can come in just for labs and then follow up with me anytime during the day.   Watch your salt! Decreasing this may help your blood pressure.  And watch your sugar and carbs for your diabetes.  No medicine changes today!  I'll see you in about 3-4 weeks.

## 2011-11-21 NOTE — Assessment & Plan Note (Signed)
Pt to return for FLP.  Continue omega-3 for now.

## 2011-11-21 NOTE — Progress Notes (Signed)
S: Pt comes in today for follow up.  HYPERTENSION BP: 157/73  --> 149/68 Meds: norvasc, accuretic, kdur Taking meds: Yes     # of doses missed/week: 1 Symptoms: Headache: No Dizziness: No Vision changes: No SOB:  No Chest pain: No LE swelling: No Tobacco use: No   DIABETES Home CBGs: doesn't check Meds: metformin ER 2 tab BID Taking Meds: yes # of doses missed per week: 2 Hypoglycemic episodes?: no Last foot exam: 06/2011 Last eye exam:  DUE NOW Symptoms: Polyuria: no Polydipsia: no Parasthesias: no   Dizziness: no  Nausea:  no Vomiting:  no Last A1c:  Lab Results  Component Value Date   HGBA1C 7.3 11/21/2011     HYPERLIPIDEMIA  Meds: omega-3 fatty acid Muscle aches: N/A Last FLP or LDL:  Lab Results  Component Value Date   LDLCALC 85 09/28/2010   Lab Results  Component Value Date   CHOL 170 09/28/2010   HDL 76 09/28/2010   LDLCALC 85 09/28/2010   LDLDIRECT 131* 08/14/2011   TRIG 47 09/28/2010   CHOLHDL 2.2 09/28/2010    Diet: nothing in particular Exercise: works out in the yard, leg and arm stretches in bed before getting up; walking every morning- 1 mile, 5-6 days/week  Weight:  Wt Readings from Last 3 Encounters:  11/21/11 118 lb (53.524 kg)  08/16/11 118 lb (53.524 kg)  08/07/11 118 lb 8 oz (53.751 kg)     ROS: Per HPI  History  Smoking status  . Never Smoker   Smokeless tobacco  . Never Used    O:  Filed Vitals:   11/21/11 1127  BP: 157/73  Pulse: 46    Gen: NAD CV: RRR, no murmur Pulm: CTA bilat, no wheezes or crackles Abd: soft, NT Ext: Warm, no chronic skin changes, no edema   A/P: 66 y.o. female p/w HTN, HLD, DM -See problem list -f/u in 1 month for BP recheck

## 2011-11-21 NOTE — Assessment & Plan Note (Addendum)
A1c stable, contiue metformin ER 1000mg  BID.   Lab Results  Component Value Date   HGBA1C 7.3 11/21/2011

## 2011-11-29 ENCOUNTER — Telehealth: Payer: Self-pay | Admitting: Family Medicine

## 2011-11-29 NOTE — Telephone Encounter (Signed)
Pt is calling inquiring about if we have a podiatrist on staff so that she can have her toenails cut.  She saw Dr. Loraine Maple on 7.24.2013 and this pt is a diabetic. I told her that we could send her to a podiatrist to have this done. Pt agreed to this. I will forward this information to her pcp for referral..Karelyn Brisby, Lahoma Crocker

## 2011-11-29 NOTE — Telephone Encounter (Signed)
Called pt. No answer, unable to leave message. Pt has [facility] card. We do not have Podiatrist through [employer] [employer]. We can address at her upcoming visit with PCP 12-21-11. [name], [name]

## 2011-11-30 ENCOUNTER — Other Ambulatory Visit: Payer: Self-pay

## 2011-11-30 DIAGNOSIS — E119 Type 2 diabetes mellitus without complications: Secondary | ICD-10-CM

## 2011-11-30 DIAGNOSIS — I10 Essential (primary) hypertension: Secondary | ICD-10-CM

## 2011-11-30 DIAGNOSIS — E7800 Pure hypercholesterolemia, unspecified: Secondary | ICD-10-CM

## 2011-11-30 LAB — COMPREHENSIVE METABOLIC PANEL
ALT: 11 U/L (ref 0–35)
AST: 20 U/L (ref 0–37)
Albumin: 4.3 g/dL (ref 3.5–5.2)
Alkaline Phosphatase: 57 U/L (ref 39–117)
Glucose, Bld: 117 mg/dL — ABNORMAL HIGH (ref 70–99)
Potassium: 3.2 mEq/L — ABNORMAL LOW (ref 3.5–5.3)
Sodium: 139 mEq/L (ref 135–145)
Total Protein: 7.2 g/dL (ref 6.0–8.3)

## 2011-11-30 LAB — LIPID PANEL
LDL Cholesterol: 153 mg/dL — ABNORMAL HIGH (ref 0–99)
Total CHOL/HDL Ratio: 3.3 Ratio
VLDL: 8 mg/dL (ref 0–40)

## 2011-11-30 LAB — CBC
MCH: 30.4 pg (ref 26.0–34.0)
MCHC: 34 g/dL (ref 30.0–36.0)
Platelets: 200 10*3/uL (ref 150–400)
RDW: 13.3 % (ref 11.5–15.5)

## 2011-11-30 NOTE — Progress Notes (Signed)
CMP,CBC AND FLP DONE TODAY [name] 

## 2011-12-02 ENCOUNTER — Telehealth: Payer: Self-pay | Admitting: Family Medicine

## 2011-12-02 ENCOUNTER — Encounter: Payer: Self-pay | Admitting: Family Medicine

## 2011-12-02 NOTE — Telephone Encounter (Signed)
Please call pt and inform her that her potassium is low.  If she is taking her K supplement, please ask her to increase to TWO tabs po daily.  Otherwise, please ask her to restart her K-dur if she has not been taking it.    I will recheck her K on 8/23 at her next appt with me. Thanks!

## 2011-12-03 NOTE — Telephone Encounter (Signed)
Pt states she is taking 1 daily of her Potassium, so she will inc to 2 daily and keep her appt on 8/23.

## 2011-12-21 ENCOUNTER — Ambulatory Visit: Payer: Self-pay | Admitting: Family Medicine

## 2012-01-02 ENCOUNTER — Ambulatory Visit (INDEPENDENT_AMBULATORY_CARE_PROVIDER_SITE_OTHER): Payer: Medicare Other | Admitting: Family Medicine

## 2012-01-02 ENCOUNTER — Encounter: Payer: Self-pay | Admitting: Family Medicine

## 2012-01-02 VITALS — BP 132/70 | HR 56 | Ht 61.0 in | Wt 117.6 lb

## 2012-01-02 DIAGNOSIS — E876 Hypokalemia: Secondary | ICD-10-CM | POA: Insufficient documentation

## 2012-01-02 DIAGNOSIS — E7800 Pure hypercholesterolemia, unspecified: Secondary | ICD-10-CM

## 2012-01-02 DIAGNOSIS — R232 Flushing: Secondary | ICD-10-CM | POA: Insufficient documentation

## 2012-01-02 DIAGNOSIS — I10 Essential (primary) hypertension: Secondary | ICD-10-CM

## 2012-01-02 DIAGNOSIS — N951 Menopausal and female climacteric states: Secondary | ICD-10-CM

## 2012-01-02 MED ORDER — POTASSIUM CHLORIDE CRYS ER 20 MEQ PO TBCR: 40.0000 meq | EXTENDED_RELEASE_TABLET | Freq: Every day | ORAL | Status: DC

## 2012-01-02 MED ORDER — ROSUVASTATIN CALCIUM 20 MG PO TABS: 20.0000 mg | ORAL_TABLET | Freq: Every day | ORAL | Status: DC

## 2012-01-02 NOTE — Progress Notes (Signed)
S: Pt comes in today for follow up.  HYPERTENSION BP: 132/70 Meds: norvasc 10, accuretic 20/12.5, K-dur 20 mEq Taking meds: Yes     # of doses missed/week: 0 Symptoms: Headache: No Dizziness: No Vision changes: No SOB:  No Chest pain: No LE swelling: No Tobacco use: Yes  HYPERLIPIDEMIA  Meds: none; previously on Crestor and tolerated  Muscle aches: N/A Last FLP or LDL:  Lab Results  Component Value Date   LDLCALC 153* 11/30/2011   Lab Results  Component Value Date   CHOL 232* 11/30/2011   HDL 71 11/30/2011   LDLCALC 153* 11/30/2011   LDLDIRECT 131* 08/14/2011   TRIG 40 11/30/2011   CHOLHDL 3.3 11/30/2011    Weight:  Wt Readings from Last 3 Encounters:  01/02/12 117 lb 9.6 oz (53.343 kg)  11/21/11 118 lb (53.524 kg)  08/16/11 118 lb (53.524 kg)    HOT FLASHES Worse at night time, sometimes has them during the day.  Non-smoker, does not drink alcohol. Not overweight.  Does not drink much caffeine-- only 1/2 glass of tea per day watered down.  Has been having them x1 month.  Last period was 98- never previously had hot flashes.  UTD on all screening- thinks she may be due for another colonoscopy (had 2 polyps removed).  No weight loss.     ROS: Per HPI  History  Smoking status  . Never Smoker   Smokeless tobacco  . Never Used    O:  Filed Vitals:   01/02/12 1612  BP: 132/70  Pulse: 56    Gen: NAD CV: RRR, no murmur Pulm: CTA bilat, no wheezes or crackles Ext: Warm, no chronic skin changes, no edema   A/P: 66 y.o. female p/w HTN, HLD, hot flashes -See problem list -f/u in 1 month

## 2012-01-02 NOTE — Assessment & Plan Note (Signed)
BP better today, although still not quite at goal of <130/80.  Recheck in 1 month.

## 2012-01-02 NOTE — Patient Instructions (Addendum)
It was good to see you today.  We will increase your potassium to 2 tablets every day (40 mEq total).  We will leave your blood pressure medicines the same for now, but we really want your goal blood pressure to be less than 130/90 because of your diabetes (it was 132/70 today).  We are going to restart your statin (Crestor) for your cholesterol.   I recommend that you meet with our psychologist, Dr. Zella Ball, for help dealing with your sadness.  You can schedule an appointment with her by calling her directly at [phone].  For your hot flashes, try sleeping with a fan, dressing in layers, and doing more cardio exercise.  Come back in 1 month; we can check some labs if your hot flashes are still bothering you.

## 2012-01-02 NOTE — Assessment & Plan Note (Signed)
K 3.2 on most recent BMET. Increase K-dur to 29mEq daily.  Recheck BMET in 1  month

## 2012-01-02 NOTE — Assessment & Plan Note (Signed)
Pt never started fish oil.  LDL 153.  Restart Crestor since she previously tolerated this well.  Recheck direct LDL in 6 months.

## 2012-01-02 NOTE — Assessment & Plan Note (Signed)
Odd that they are starting 20 years after her last period.  Unlikely to be cancer w/ no weight loss, up to date on screening.  Could check TSH at next visit.  Could be underlying infection, unlikely to be TB w/o cough or other symptoms, unlikely to be HIV w/ no high risk behaviors.  Could check for chronic hep C given her age.

## 2012-01-21 ENCOUNTER — Ambulatory Visit (INDEPENDENT_AMBULATORY_CARE_PROVIDER_SITE_OTHER): Payer: Medicare Other | Admitting: Family Medicine

## 2012-01-21 ENCOUNTER — Encounter: Payer: Self-pay | Admitting: Family Medicine

## 2012-01-21 VITALS — BP 139/63 | HR 56 | Temp 98.0°F | Ht 61.0 in | Wt 119.0 lb

## 2012-01-21 DIAGNOSIS — S46811A Strain of other muscles, fascia and tendons at shoulder and upper arm level, right arm, initial encounter: Secondary | ICD-10-CM | POA: Insufficient documentation

## 2012-01-21 DIAGNOSIS — M25519 Pain in unspecified shoulder: Secondary | ICD-10-CM

## 2012-01-21 DIAGNOSIS — R5383 Other fatigue: Secondary | ICD-10-CM | POA: Insufficient documentation

## 2012-01-21 NOTE — Assessment & Plan Note (Addendum)
More specifically trapezius pain with neck movement.  Likely muscle origin.  No sign or joint or cervical problem.  Treat conservatively.

## 2012-01-21 NOTE — Patient Instructions (Addendum)
You have a muscle strain or catch in your R trapezius muscle. I would use heat three times daily and do range of motion exercises  You can take tylenol three times daily a day as needed for pain This should be better in about 2 weeks if not by then or if any worseing come back  I think you have a mild viral infection you should be better in a week if not or if any worsening call us  Take 4 of the 10 meq wax tablets a day instead of the 20 meq large tabs  Make sure you have an appointment to see Dr Loraine Maple on Oct 4

## 2012-01-21 NOTE — Progress Notes (Signed)
  Subjective:    Patient ID: Mallory Hayes, female    DOB: [birth year], 66 y.o.   MRN: [id number]  HPI Shoulder Back Pain Started when awoke on Sunday (1 day ago)  No overuse or injury.  Has not taken any medications. No fever or rash.  Hurts behind the shoulder area  Achy Fatigue Felt mildly feverish for last day or so.  No specific fever or rash or cough or shortness of breath or dysuria or URI symptoms.  Started crestor 1 week or so ago.   No abdomen pain or overall muscle aches.  Review of Symptoms - see HPI  PMH - Smoking status noted.      Review of Systems     Objective:   Physical Exam  Alert no acute distress Neck:  No deformities, thyromegaly, masses, or tenderness noted.   Has pain in R scapula area with flexion or R gaze Heart - Regular rate and rhythm.  No murmurs, gallops or rubs.    Lungs:  Normal respiratory effort, chest expands symmetrically. Lungs are clear to auscultation, no crackles or wheezes. Abdomen: soft and non-tender without masses, organomegaly or hernias noted.  No guarding or rebound Skin:  Intact without suspicious lesions or rashes R shoulder - FROM without pain or any weakness with strength testing  No CVAT      Assessment & Plan:

## 2012-01-21 NOTE — Assessment & Plan Note (Signed)
Very recent onset so likely viral. No signs of acute infection.  Did start Crestor recently so possibly related to this but does not have in abdomen pain or widespread muscle pain so would continue.  Asked her to follow up with her PCP

## 2012-02-01 ENCOUNTER — Encounter: Payer: Self-pay | Admitting: Family Medicine

## 2012-02-01 ENCOUNTER — Ambulatory Visit (INDEPENDENT_AMBULATORY_CARE_PROVIDER_SITE_OTHER): Payer: Medicare Other | Admitting: Family Medicine

## 2012-02-01 ENCOUNTER — Ambulatory Visit (HOSPITAL_COMMUNITY)
Admission: RE | Admit: 2012-02-01 | Discharge: 2012-02-01 | Disposition: A | Discharge status: Home / Self Care | Payer: Medicare Other | Source: Ambulatory Visit | Attending: Family Medicine | Admitting: Family Medicine

## 2012-02-01 ENCOUNTER — Ambulatory Visit: Payer: Medicare Other | Admitting: Family Medicine

## 2012-02-01 VITALS — BP 116/73 | HR 40 | Ht 61.0 in | Wt 118.0 lb

## 2012-02-01 DIAGNOSIS — R5381 Other malaise: Secondary | ICD-10-CM

## 2012-02-01 DIAGNOSIS — R001 Bradycardia, unspecified: Secondary | ICD-10-CM

## 2012-02-01 DIAGNOSIS — N951 Menopausal and female climacteric states: Secondary | ICD-10-CM

## 2012-02-01 DIAGNOSIS — E7800 Pure hypercholesterolemia, unspecified: Secondary | ICD-10-CM

## 2012-02-01 DIAGNOSIS — I10 Essential (primary) hypertension: Secondary | ICD-10-CM

## 2012-02-01 DIAGNOSIS — I498 Other specified cardiac arrhythmias: Secondary | ICD-10-CM

## 2012-02-01 DIAGNOSIS — E876 Hypokalemia: Secondary | ICD-10-CM

## 2012-02-01 DIAGNOSIS — R232 Flushing: Secondary | ICD-10-CM

## 2012-02-01 DIAGNOSIS — R5383 Other fatigue: Secondary | ICD-10-CM

## 2012-02-01 LAB — BASIC METABOLIC PANEL
Calcium: 9.8 mg/dL (ref 8.4–10.5)
Glucose, Bld: 109 mg/dL — ABNORMAL HIGH (ref 70–99)
Potassium: 3.6 mEq/L (ref 3.5–5.3)
Sodium: 138 mEq/L (ref 135–145)

## 2012-02-01 LAB — TSH: TSH: 1.356 u[IU]/mL (ref 0.350–4.500)

## 2012-02-01 NOTE — Patient Instructions (Signed)
It was good to see you today. I'm glad you're feeling better.  We're checking some labs-- I will call if there is anything abnormal or if we need to change your potassium dose.  I will send in the smaller tablets for you to take.  You are eligible for an annual wellness visit with our health coach-- you can call and schedule that if you are interested.  I would like to see you back in about 4 weeks to recheck your blood pressure and heart rate.  If you get dizzy, short of breath, or have chest pain, please go to the ER or come in to be seen here in clinic.

## 2012-02-01 NOTE — Assessment & Plan Note (Signed)
Check BMET. Of note, pt cannot swallow 35mEq tabs (too big), so taking 2 30mEq tabs BID. Will need to send in 81mEq tabs in the future.

## 2012-02-01 NOTE — Assessment & Plan Note (Signed)
Asymptomatic. Sinus rhythm on EKG. Not on any medications that should cause this (not on BB, aricept, etc).  F/u 3-4 weeks to recheck HR.  Red flags for sooner return discussed.

## 2012-02-01 NOTE — Assessment & Plan Note (Signed)
At goal today w/o med changes. Using lisinopril/HCTZ rather than accuretic b/c this is what is covered. Recheck BMET to follow K today.

## 2012-02-01 NOTE — Assessment & Plan Note (Signed)
Tolerating crestor. Recheck direct LDL or FLP in 06/2012.

## 2012-02-01 NOTE — Progress Notes (Signed)
S: Pt comes in today for follow up.  HYPERTENSION BP: 116/73 Meds: norvasc 10, lisino/hctz 20/12.5, kdur Taking meds: Yes     # of doses missed/week: 0; sometimes misses k-dur Symptoms: Headache: No Dizziness: No Vision changes: No SOB:  No Chest pain: No LE swelling: No Tobacco use: No Bradycardia: incidental finding; no SOB, CP, dizziness; fatigue has improved   HOT FLASHES Seem to be a little better, usually just when she gets anxious or goes outside when it is hot. No vaginal bleeding.    HYPERLIPIDEMIA  Meds: crestor Muscle aches: no Last FLP or LDL:  Lab Results  Component Value Date   LDLCALC 153* 11/30/2011   Lab Results  Component Value Date   CHOL 232* 11/30/2011   HDL 71 11/30/2011   LDLCALC 153* 11/30/2011   LDLDIRECT 131* 08/14/2011   TRIG 40 11/30/2011   CHOLHDL 3.3 11/30/2011   Weight:  Wt Readings from Last 3 Encounters:  02/01/12 118 lb (53.524 kg)  01/21/12 119 lb (53.978 kg)  01/02/12 117 lb 9.6 oz (53.343 kg)     ROS: Per HPI  History  Smoking status  . Never Smoker   Smokeless tobacco  . Never Used    O:  Filed Vitals:   02/01/12 1112  BP: 116/73  Pulse: 40    Gen: NAD CV: Regular rhythm, bradycardia, no murmur Pulm: CTA bilat, no wheezes or crackles Ext: Warm, no edema   A/P: 66 y.o. female p/w HTN, hypoK, hot flashes, bradycardia -See problem list -f/u in 1 month

## 2012-02-01 NOTE — Assessment & Plan Note (Signed)
Check TSH today

## 2012-02-19 ENCOUNTER — Encounter: Payer: Self-pay | Admitting: Gastroenterology

## 2012-02-20 ENCOUNTER — Telehealth: Payer: Self-pay | Admitting: *Deleted

## 2012-03-03 NOTE — Telephone Encounter (Signed)
Called patient to set up colon appointment, pt said she would call me back

## 2012-03-06 ENCOUNTER — Ambulatory Visit (INDEPENDENT_AMBULATORY_CARE_PROVIDER_SITE_OTHER): Payer: Medicare Other | Admitting: Family Medicine

## 2012-03-06 ENCOUNTER — Encounter: Payer: Self-pay | Admitting: Family Medicine

## 2012-03-06 VITALS — BP 118/77 | HR 48 | Ht 61.0 in | Wt 120.0 lb

## 2012-03-06 DIAGNOSIS — N951 Menopausal and female climacteric states: Secondary | ICD-10-CM

## 2012-03-06 DIAGNOSIS — I10 Essential (primary) hypertension: Secondary | ICD-10-CM

## 2012-03-06 DIAGNOSIS — E7800 Pure hypercholesterolemia, unspecified: Secondary | ICD-10-CM

## 2012-03-06 DIAGNOSIS — F411 Generalized anxiety disorder: Secondary | ICD-10-CM

## 2012-03-06 DIAGNOSIS — R232 Flushing: Secondary | ICD-10-CM

## 2012-03-06 DIAGNOSIS — E119 Type 2 diabetes mellitus without complications: Secondary | ICD-10-CM

## 2012-03-06 LAB — POCT GLYCOSYLATED HEMOGLOBIN (HGB A1C): Hemoglobin A1C: 7.4

## 2012-03-06 MED ORDER — GLIPIZIDE 5 MG PO TABS: 2.5000 mg | ORAL_TABLET | Freq: Every day | ORAL | Status: DC

## 2012-03-06 NOTE — Assessment & Plan Note (Signed)
BP well controlled today.  Last K normal. Consider recheck in 1 month at follow up. Continue lisino/HCTZ and norvasc + 19mEq K.

## 2012-03-06 NOTE — Progress Notes (Signed)
S: Pt comes in today for follow up visit.  DIABETES Home CBGs: not checking currently Meds: metformin XL 1000 daily Taking Meds: yes # of doses missed per week: 0 Hypoglycemic episodes?: no Symptoms: Polyuria: no Polydipsia: no Parasthesias: no   Dizziness: no  Nausea:  no Vomiting:  no Last A1c:  Lab Results  Component Value Date   HGBA1C 7.4 03/06/2012    HYPERTENSION BP: 118/77 Meds: norvasc, lisino/HCTZ Taking meds: Yes    # of doses missed/week: 0 Symptoms: Headache: No Dizziness: No Vision changes: No SOB:  No Chest pain: No LE swelling: No Tobacco use: No   HYPERLIPIDEMIA  Meds: Crestor Muscle aches: no Last FLP or LDL:  Lab Results  Component Value Date   LDLCALC 153* 11/30/2011   Lab Results  Component Value Date   CHOL 232* 11/30/2011   HDL 71 11/30/2011   LDLCALC 153* 11/30/2011   LDLDIRECT 131* 08/14/2011   TRIG 40 11/30/2011   CHOLHDL 3.3 11/30/2011   Exercise: walking every day  Weight:  Wt Readings from Last 3 Encounters:  03/06/12 120 lb (54.432 kg)  02/01/12 118 lb (53.524 kg)  01/21/12 119 lb (53.978 kg)    HOT FLASHES Continues to be an issue.  Typically has 1-2 per day on the days she gets them, which is usually ~4 days/week.  Does not physically sweat or get red/flushed but feels warm all over.  Taking off layers helps. They usually last ~10 minutes and then go away on their own. Tend to come "out of no where."  Do not tend to happen at night and are not waking her up from sleep.    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  She previously was on tranxene (which is a benzo) in 2009/2010 for similar symptoms.  Would just need it PRN to help take the edge away from her nervousness.   ROS: Per HPI  History  Smoking status  . Never Smoker   Smokeless tobacco  . Never Used    O:  Filed Vitals:   03/06/12 1049  BP: 118/77  Pulse: 48    Gen: NAD,  thin, pleasant  CV: RRR, no murmur Pulm: CTA bilat, no wheezes or crackles Ext: Warm, no chronic skin changes, no edema   A/P: 66 y.o. female p/w HTN, DM, HLD, hot flashes, anxiety -See problem list -f/u in 1 month

## 2012-03-06 NOTE — Assessment & Plan Note (Signed)
Tolerating crestor well.  Due for FLP or LDL in 06/2011.

## 2012-03-06 NOTE — Assessment & Plan Note (Signed)
A1c remains >7 on metformin ER 1000mg  daily.  Will add glipizide 2.5mg  daily. Pt to start checking fasting CBGs and bring with her to f/u in 1 month.  S/s of hypoglycemia discussed.

## 2012-03-06 NOTE — Assessment & Plan Note (Signed)
Situational anxiety with speaking at [facility].  Previously was on tranxene (long acting benzo) in 2009/2010.  D/w pt about discouraging benzos in elderly patients.  Cannot do BB given pt's low baseline heart rate.  Could consider primidone if this continues to be an issue (2nd line for essential tremor).

## 2012-03-06 NOTE — Assessment & Plan Note (Signed)
Continue to be an issue, but not waking her from sleep.  Could consider trying gabapentin if become more problematic.

## 2012-03-06 NOTE — Patient Instructions (Signed)
It was good to see you again today.  I think we should wait to start any medicine for your shakiness while speaking in church.  If it becomes more of an issue, we could try starting a medicine.  For your diabetes, I want to get your sugars a little better under control.  I am sending in a new medicine call glipizide.  Check your sugars and keep track.  If you start feeling like your sugars are getting too low (you're getting shaky, sweating, dizzy, etc).  Come back to see me in 1 month to recheck on everything.

## 2012-03-12 ENCOUNTER — Telehealth: Payer: Self-pay | Admitting: Gastroenterology

## 2012-03-12 ENCOUNTER — Other Ambulatory Visit: Payer: Self-pay | Admitting: Family Medicine

## 2012-03-12 DIAGNOSIS — Z1231 Encounter for screening mammogram for malignant neoplasm of breast: Secondary | ICD-10-CM

## 2012-03-12 NOTE — Telephone Encounter (Signed)
Pt had last colon done at [facility]. Pt thinks she needs to have her next one done there also. Dr. Deatra Ina please advise.

## 2012-03-13 NOTE — Telephone Encounter (Signed)
Last colonoscopy was done at the hospital because we thought we may have to use the laser. Exam was negative. Exam can be done in the [facility] since I do not anticipate the need for the laser for this procedure

## 2012-03-18 NOTE — Telephone Encounter (Signed)
Left message for pt to call back  °

## 2012-03-24 ENCOUNTER — Telehealth: Payer: Self-pay | Admitting: Family Medicine

## 2012-03-24 NOTE — Telephone Encounter (Signed)
[employer] Provider Section Pamphlet to be completed by [place]. Patient is aware that we cannot complete her Prescription Savings program form, please mail both forms to patient once completed.

## 2012-03-24 NOTE — Telephone Encounter (Signed)
[employer]  Diabetes reward form completed.  Patient has not had a microalbumin test done in the 2013.  Will give form to Dr. Loraine Maple to sign and decide if patient should return to office for Microalbumin.  Patient was notified by Tia that we do not fill out forms for Medication Assistance Programs.Delsa Sale, Chipper Herb

## 2012-03-25 NOTE — Telephone Encounter (Signed)
Does not need urine microalbumin- not eligible since she is on an ACE-inhibitor.  Physician section of form completed and returned to [facility].

## 2012-03-25 NOTE — Telephone Encounter (Signed)
Forms mailed to [place], [place], [place] [address] per Necola's request.  Lauralyn Primes

## 2012-04-01 NOTE — Telephone Encounter (Signed)
Called pt to try and schedule colon. Pt asked if I could call her back in 53min. Let pt know she could call back when she was ready to talk.

## 2012-04-01 NOTE — Telephone Encounter (Signed)
Pt called back and scheduled her previsit and colon for the [facility]. Pt aware of appt dates and times.

## 2012-04-01 NOTE — Telephone Encounter (Signed)
Need to mail pt a letter

## 2012-04-02 ENCOUNTER — Encounter: Payer: Self-pay | Admitting: Family Medicine

## 2012-04-02 ENCOUNTER — Ambulatory Visit (INDEPENDENT_AMBULATORY_CARE_PROVIDER_SITE_OTHER): Payer: Medicare Other | Admitting: Family Medicine

## 2012-04-02 VITALS — BP 136/76 | HR 49 | Temp 97.9°F | Ht 61.0 in | Wt 121.5 lb

## 2012-04-02 DIAGNOSIS — J069 Acute upper respiratory infection, unspecified: Secondary | ICD-10-CM

## 2012-04-02 DIAGNOSIS — I10 Essential (primary) hypertension: Secondary | ICD-10-CM

## 2012-04-02 DIAGNOSIS — E119 Type 2 diabetes mellitus without complications: Secondary | ICD-10-CM

## 2012-04-02 MED ORDER — BENZONATATE 200 MG PO CAPS: 200.0000 mg | ORAL_CAPSULE | Freq: Three times a day (TID) | ORAL | Status: DC | PRN

## 2012-04-02 NOTE — Progress Notes (Signed)
S: Pt comes in today for follow up.  HEAD COLD Started 2 days ago- + cough- nonproductive, + congestion, +rhinorrhea, ?fever- hasn't taken it but has felt warm, no chills, decreased energy.  A little achy.  A little sinus pressure.  No sick contacts   HYPERTENSION BP: 136/76 Meds: Lisinopril/HCTZ 12.5, norvasc 10, K-dur 40 Taking meds: Yes     # of doses missed/week: 0 Symptoms: Headache: No Dizziness: No Vision changes: No SOB:  No Chest pain: No LE swelling: No Tobacco use: No   DIABETES Home CBGs: has not checked them Meds: metformin XL 1000, glipizide 2.5 qd (started 11/7) Taking Meds: yes # of doses missed per week: none for metformin, has missed glipizide 3 times per week Hypoglycemic episodes?: no Symptoms: Polyuria: no Polydipsia: no Parasthesias: no   Dizziness: no  Nausea:  no Vomiting:  no Last A1c: 7.4 on 03/06/12    ROS: Per HPI  History  Smoking status  . Never Smoker   Smokeless tobacco  . Never Used    O:  Filed Vitals:   04/02/12 1534  BP: 136/76  Pulse: 49  Temp: 97.9 F (36.6 C)    Gen: NAD HEENT: MMM, mild pharyngeal erythema, no exudate, no cervical LAD, no sinus TTP, nasal mucosa normal  CV: RRR, no murmur Pulm: CTA bilat, no wheezes or crackles Ext: Warm, no edema   A/P: 66 y.o. female p/w HTN, DM, viral URI -See problem list -f/u in 1 month

## 2012-04-02 NOTE — Assessment & Plan Note (Signed)
2 days, will try symptomatic therapy- see AVS for pt instructions. Red flags for return discussed.

## 2012-04-02 NOTE — Assessment & Plan Note (Signed)
Mildly elevated today, but has been previously well controlled on her lisinopril/HCTZ 20/12.5 and norvasc 10. Currently on K 40 mEq daily. Consider BMET at next visit.  BP Readings from Last 3 Encounters:  04/02/12 136/76  03/06/12 118/77  02/01/12 116/73

## 2012-04-02 NOTE — Patient Instructions (Signed)
It was good to see you today.  Don't beat yourself up over not checking your sugars- let's compromise and have you check them when you are feeling shaky to make sure your blood sugar isn't dropping too low.  For your cold, make sure you are drinking plenty of fluids.  I sent in a pill to help with your cough-- if it's too expensive, don't worry about taking it.  You can also use over the counter nasal saline rinses or nose sprays.  Try to stay away from anything with pseudoephed  in it because it can make your blood pressure go up.  You can use over the counter Coricidin, which will not make your blood pressure high.  Come back to see me in 1 month so we can see how your sugars and blood pressure are doing.

## 2012-04-02 NOTE — Assessment & Plan Note (Signed)
Has not checked CBGs, but does occasionally feel "shaky" after taking glipizide-- does not take it every day. Continue both metformin and glipizide with instructions to at least check CBGs when feeling shaky.  F/u 1 month. Next A1c due 05/2012.

## 2012-04-03 ENCOUNTER — Ambulatory Visit: Payer: Medicare Other | Admitting: Family Medicine

## 2012-04-09 NOTE — Telephone Encounter (Signed)
Gave chart back to jessie to send pt a letter

## 2012-04-14 ENCOUNTER — Ambulatory Visit (HOSPITAL_COMMUNITY): Payer: Medicare Other

## 2012-04-16 ENCOUNTER — Ambulatory Visit (HOSPITAL_COMMUNITY)
Admission: RE | Admit: 2012-04-16 | Discharge: 2012-04-16 | Disposition: A | Discharge status: Home / Self Care | Payer: Medicare Other | Source: Ambulatory Visit | Attending: Family Medicine | Admitting: Family Medicine

## 2012-04-16 DIAGNOSIS — Z1231 Encounter for screening mammogram for malignant neoplasm of breast: Secondary | ICD-10-CM | POA: Insufficient documentation

## 2012-05-12 ENCOUNTER — Encounter: Payer: Self-pay | Admitting: Family Medicine

## 2012-05-12 ENCOUNTER — Ambulatory Visit (INDEPENDENT_AMBULATORY_CARE_PROVIDER_SITE_OTHER): Payer: Medicare Other | Admitting: Family Medicine

## 2012-05-12 VITALS — BP 93/65 | HR 51 | Ht 61.0 in | Wt 120.0 lb

## 2012-05-12 DIAGNOSIS — I10 Essential (primary) hypertension: Secondary | ICD-10-CM

## 2012-05-12 DIAGNOSIS — Z23 Encounter for immunization: Secondary | ICD-10-CM

## 2012-05-12 DIAGNOSIS — E119 Type 2 diabetes mellitus without complications: Secondary | ICD-10-CM

## 2012-05-12 NOTE — Patient Instructions (Addendum)
It was good to see you today.  We are checking that lab for your insurance.  We also gave you your flu shot.  Come back in 1-2 weeks to check your blood pressure since it's a little low.  Come sooner if you start getting dizzy when standing up or walking around.  We will check a lab test to check your potassium at your next visit too.

## 2012-05-12 NOTE — Progress Notes (Signed)
S: Pt comes in today for appointment for testing for insurance purposes.  Gets a gift card if she has a urine micro albumin done.  HYPERTENSION BP: 93/65    **asymptomatic, no dizziness with standing Meds: Lisinopril/HCTZ 20/12.5, norvasc 10, K-dur 40 Taking meds: Yes     # of doses missed/week: 0 Symptoms: Headache: No Dizziness: No Vision changes: No SOB:  No Chest pain: No LE swelling: No Tobacco use: No   DIABETES Home CBGs: fasting usually 90's-110's Meds: metformin XL 1000, glipizide 2.5 qd (started 11/7) Taking Meds: yes # of doses missed per week: none for metformin, has missed glipizide 1 time per week  Hypoglycemic episodes?: no Symptoms: Polyuria: no Polydipsia: no  Parasthesias: no   Dizziness: no  Nausea:  no Vomiting:  no Last A1c: 7.4 on 03/06/12     ROS: Per HPI  History  Smoking status  . Never Smoker   Smokeless tobacco  . Never Used    O:  Filed Vitals:   05/12/12 1414  BP: 93/65  Pulse: 51    Gen: NAD CV: Regular, bradycardic, no murmur Pulm: CTA bilat, no wheezes or crackles Ext: Warm, no edema   A/P: 67 y.o. female p/w HTN, DM -See problem list -f/u in 1-2 weeks

## 2012-05-12 NOTE — Assessment & Plan Note (Signed)
Checking CBGs, well controlled.  Continue current metformin and glipizide.  Next A1c due 05/2012

## 2012-05-12 NOTE — Assessment & Plan Note (Signed)
Low today but asymptomatic. Last visit was elevated.  Will have pt f/u in 1-2 weeks for recheck.  If still low, could decrease norvasc dose.   BP Readings from Last 3 Encounters:  05/12/12 93/65  04/02/12 136/76  03/06/12 118/77

## 2012-05-16 ENCOUNTER — Telehealth: Payer: Self-pay | Admitting: Family Medicine

## 2012-05-16 MED ORDER — GLUCOSE BLOOD VI STRP: ORAL_STRIP | Status: DC

## 2012-05-16 NOTE — Telephone Encounter (Signed)
Patient needs an initial Rx on True Test Strips.  She has always had samples and never an Rx so she cannot contact her pharmacy for a refill request.  [facility] [address]

## 2012-05-16 NOTE — Telephone Encounter (Signed)
Sent!

## 2012-05-16 NOTE — Telephone Encounter (Signed)
Forward to PCP for Rx

## 2012-05-20 ENCOUNTER — Encounter: Payer: Medicare Other | Admitting: Gastroenterology

## 2012-05-26 ENCOUNTER — Ambulatory Visit: Payer: Medicare Other | Admitting: Family Medicine

## 2012-05-29 ENCOUNTER — Other Ambulatory Visit: Payer: Self-pay | Admitting: Family Medicine

## 2012-05-29 MED ORDER — LANCET DEVICE MISC: Status: DC

## 2012-05-29 NOTE — Telephone Encounter (Signed)
Patient is calling because she needs a new Rx for her Lancets to go to [facility] on [address].  She has never gotten them at [facility] before so after this initial Rx, she will be able to go through them for further refills.

## 2012-05-29 NOTE — Telephone Encounter (Signed)
Left message informing patient Rx sent to her pharmacy.Busick, [name]

## 2012-05-29 NOTE — Telephone Encounter (Signed)
Forward to PCP for Rx

## 2012-05-29 NOTE — Telephone Encounter (Signed)
Sent in for generic lancets.

## 2012-06-04 ENCOUNTER — Telehealth: Payer: Self-pay | Admitting: Family Medicine

## 2012-06-04 NOTE — Telephone Encounter (Signed)
Patient is calling because her Lancet Device is not working anymore and she needs a new one sent to [facility] on [address].

## 2012-06-04 NOTE — Telephone Encounter (Signed)
Patient needs a new lancet devise, she has the lancets, sent to [facility] on [address].Busick, [name]

## 2012-06-06 ENCOUNTER — Encounter: Payer: Self-pay | Admitting: Family Medicine

## 2012-06-06 ENCOUNTER — Ambulatory Visit (INDEPENDENT_AMBULATORY_CARE_PROVIDER_SITE_OTHER): Payer: Medicare Other | Admitting: Family Medicine

## 2012-06-06 VITALS — BP 102/60 | HR 60 | Ht 61.0 in | Wt 120.8 lb

## 2012-06-06 DIAGNOSIS — E119 Type 2 diabetes mellitus without complications: Secondary | ICD-10-CM

## 2012-06-06 DIAGNOSIS — I10 Essential (primary) hypertension: Secondary | ICD-10-CM

## 2012-06-06 NOTE — Patient Instructions (Signed)
It was good to see you today.  Keep taking the metformin and glipizide for your diabetes.  We are checking your A1c number today.  For your blood pressure, continue to not take the norvasc.  Decrease your lisinopril/HCTZ to 1 pill 1 time per day.  Come back to see Dr. Valentina Lucks for [place] in the next couple of weeks-- they can schedule that up front on your way out.

## 2012-06-06 NOTE — Assessment & Plan Note (Addendum)
Check A1c- now 6.8! Eye scan done.  Needs foot exam at next visit.

## 2012-06-06 NOTE — Progress Notes (Signed)
S: Pt comes in today for follow up.  HYPERTENSION BP: 102/60 ([facility], 128/84 this week) Meds: Lisinopril/HCTZ 40/25, norvasc 10, K-dur 40 Taking meds: Yes except norvasc, stopped that >1 week ago    # of doses missed/week: 0 Symptoms: Headache: No Dizziness: No Vision changes: No SOB:  No Chest pain: No LE swelling: No Tobacco use: No   DIABETES Home CBGs: 92-129 fasting Meds: metformin XL 1000, glipizide 2.5 qd Taking Meds: yes- did not take glipizide last night  # of doses missed per week: 0 Hypoglycemic episodes?: no Symptoms: Polyuria: no Polydipsia: no Parasthesias: no   Dizziness: no  Nausea:  no Vomiting:  no Last A1c: TODAY (pending)     ROS: Per HPI  History  Smoking status  . Never Smoker   Smokeless tobacco  . Never Used    O:  Filed Vitals:   06/06/12 1026  BP: 102/60  Pulse: 60    Gen: NAD CV: RRR, no murmur Pulm: CTA bilat, no wheezes or crackles Ext: Warm, no edema   A/P: 67 y.o. female p/w HTN, DM -See problem list -f/u in 3-4 weeks

## 2012-06-06 NOTE — Assessment & Plan Note (Signed)
Still low today.  Pt already stopped norvasc on her own.  Asymptomatic but pt is very concerned with the low number.  Decrease lisino/HCTZ to 20/12.5  Will have pt do 24hr amb BP monitoring with Dr. Valentina Lucks to get better idea of her BPs (she [facility] numbers with [facility]).   Needs BMET at next visit, would also like Vit D checked.

## 2012-06-17 ENCOUNTER — Encounter: Payer: Self-pay | Admitting: Pharmacist

## 2012-06-17 ENCOUNTER — Ambulatory Visit (INDEPENDENT_AMBULATORY_CARE_PROVIDER_SITE_OTHER): Payer: Medicare Other | Admitting: Pharmacist

## 2012-06-17 VITALS — BP 118/62 | HR 56 | Ht 62.0 in | Wt 122.0 lb

## 2012-06-17 DIAGNOSIS — E119 Type 2 diabetes mellitus without complications: Secondary | ICD-10-CM

## 2012-06-17 DIAGNOSIS — I10 Essential (primary) hypertension: Secondary | ICD-10-CM

## 2012-06-17 MED ORDER — LISINOPRIL 20 MG PO TABS: 20.0000 mg | ORAL_TABLET | Freq: Every day | ORAL | Status: DC

## 2012-06-17 NOTE — Assessment & Plan Note (Addendum)
Hypertension of two years duration, currently requiring less medication to maintain adequate control. Pt has no clear reason for her improved HTN though her improved stress level does appear most likely. Since she has been checking frequently and doesn't have any compelling reason to suspect missing BP elevations ambulatory monitoring was not done at this time. HCTZ and potassium were D/Ced.  Continue lisinopril 20mg  monotherapy. She was instructed to continue monitoring outside of the office ([facility] or other locations) and if she has elevations will reconsider ambulatory monitoring or adding back the HCTZ. Patient seen with Billey Gosling PharmD, pharmacy resident.

## 2012-06-17 NOTE — Progress Notes (Signed)
HPI: Pt presents in good spirits with concern of her blood pressure being low recently. She had been on Norvasc 10mg  and Lisinopril/HCTZ 40/25mg  in recent past, today just on Lisinopril/HCTZ 20/12.5mg . Pt's weight, diet, and physical activity have all been consistent for years. She has been in stress with her mother's passing 2 years prior, but this stress has improved recently. Pt's BP has been in the 120s/60s [facility] and recently in office. She denies dizziness.   Pt's DM has been under good control recently, but she does not like glipizide due hypoglycemia and the desire to not have to have such consistent meals.  A/P: Hypertension of two years duration, currently requiring less medication to maintain adequate control. Pt has no clear reason for her improved HTN though her improved stress level does appear most likely. Since she has been checking frequently and doesn't have any compelling reason to suspect missing BP elevations ambulatory monitoring was not done at this time. HCTZ and potassium were D/Ced.  Continue lisinopril 20mg  monotherapy. She was instructed to continue monitoring outside of the office ([facility] or other locations) and if she has elevations will reconsider ambulatory monitoring or adding back the HCTZ.  Pt was taking glipizide IR in the evening and expressed occasional hypoglycemia. She was instructed to start taking the glipizide in the AM to reduce this risk. Patient seen with Billey Gosling PharmD, pharmacy resident.

## 2012-06-17 NOTE — Patient Instructions (Addendum)
It was nice seeing you today, here are some directions until the next time we see you  1. Stop taking the Lisinopril/HCTZ combo and potassium 2. Start taking the Lisinopril 20mg  daily 3. Continue to check your BP at home and let us know if you get readings over over 135 let us know 4. Start taking the glipizide in the morning Follow up with Dr. Loraine Maple in 1-2 months.

## 2012-06-17 NOTE — Assessment & Plan Note (Addendum)
Pt was taking glipizide IR in the evening and expressed occasional hypoglycemia. She was instructed to start taking the glipizide in the AM to reduce this risk. Patient seen with Billey Gosling PharmD, pharmacy resident.

## 2012-06-18 NOTE — Progress Notes (Signed)
Patient ID: Mallory Hayes, female   DOB: 1946-04-30, 67 y.o.   MRN: [id number] Reviewed: Agree with Dr. Graylin Shiver documentation and management.

## 2012-07-08 ENCOUNTER — Encounter: Payer: Self-pay | Admitting: Family Medicine

## 2012-07-22 ENCOUNTER — Telehealth: Payer: Self-pay | Admitting: Family Medicine

## 2012-07-22 DIAGNOSIS — I10 Essential (primary) hypertension: Secondary | ICD-10-CM

## 2012-07-22 DIAGNOSIS — E119 Type 2 diabetes mellitus without complications: Secondary | ICD-10-CM

## 2012-07-22 DIAGNOSIS — E7800 Pure hypercholesterolemia, unspecified: Secondary | ICD-10-CM

## 2012-07-22 NOTE — Telephone Encounter (Signed)
Ordered

## 2012-07-22 NOTE — Telephone Encounter (Signed)
She is due to have her cholesterol checked, but I am more than happy to refill the medicine if she needs it filled.  She can come and get the blood work done any time-- I can put in future orders or she can come to a morning office visit- whichever she prefers. Thanks!

## 2012-07-22 NOTE — Telephone Encounter (Signed)
Patient is coming in for fasting labs on Monday, please put in future order.Mallory Hayes, [name]

## 2012-07-22 NOTE — Telephone Encounter (Signed)
Forward to PCP for refill request.Busick, [name]

## 2012-07-22 NOTE — Telephone Encounter (Signed)
Patient is calling to find out if she needs to have blood work or be seen before getting another refill on Crestor.

## 2012-07-28 ENCOUNTER — Other Ambulatory Visit: Payer: Medicare Other

## 2012-07-28 DIAGNOSIS — E119 Type 2 diabetes mellitus without complications: Secondary | ICD-10-CM

## 2012-07-28 DIAGNOSIS — I10 Essential (primary) hypertension: Secondary | ICD-10-CM

## 2012-07-28 LAB — COMPREHENSIVE METABOLIC PANEL
AST: 19 U/L (ref 0–37)
Alkaline Phosphatase: 59 U/L (ref 39–117)
BUN: 16 mg/dL (ref 6–23)
Creat: 1.08 mg/dL (ref 0.50–1.10)
Glucose, Bld: 116 mg/dL — ABNORMAL HIGH (ref 70–99)
Potassium: 3.6 mEq/L (ref 3.5–5.3)
Total Bilirubin: 0.5 mg/dL (ref 0.3–1.2)

## 2012-07-28 NOTE — Progress Notes (Signed)
CMP AND D-LDL DONE TODAY,UNABLE TO DO LIPID .TOO SOON FOR TEST Moraima Burd

## 2012-07-29 ENCOUNTER — Encounter: Payer: Self-pay | Admitting: Family Medicine

## 2012-08-01 ENCOUNTER — Encounter: Payer: Self-pay | Admitting: Family Medicine

## 2012-08-01 ENCOUNTER — Ambulatory Visit (INDEPENDENT_AMBULATORY_CARE_PROVIDER_SITE_OTHER): Payer: Medicare Other | Admitting: Family Medicine

## 2012-08-01 VITALS — BP 135/62 | HR 52 | Temp 97.8°F | Ht 62.0 in | Wt 120.7 lb

## 2012-08-01 DIAGNOSIS — N760 Acute vaginitis: Secondary | ICD-10-CM

## 2012-08-01 DIAGNOSIS — N952 Postmenopausal atrophic vaginitis: Secondary | ICD-10-CM | POA: Insufficient documentation

## 2012-08-01 DIAGNOSIS — R300 Dysuria: Secondary | ICD-10-CM

## 2012-08-01 DIAGNOSIS — N898 Other specified noninflammatory disorders of vagina: Secondary | ICD-10-CM

## 2012-08-01 LAB — POCT URINALYSIS DIPSTICK
Bilirubin, UA: NEGATIVE
Glucose, UA: NEGATIVE
Spec Grav, UA: 1.01
Urobilinogen, UA: 0.2

## 2012-08-01 LAB — POCT WET PREP (WET MOUNT)
Clue Cells Wet Prep Whiff POC: NEGATIVE
WBC, Wet Prep HPF POC: >20

## 2012-08-01 LAB — POCT UA - MICROSCOPIC ONLY

## 2012-08-01 MED ORDER — ESTROGENS, CONJUGATED 0.625 MG/GM VA CREA: TOPICAL_CREAM | Freq: Every day | VAGINAL | Status: DC

## 2012-08-01 NOTE — Progress Notes (Signed)
Subjective:     Patient ID: Mallory Hayes, female   DOB: [birth year], 67 y.o.   MRN: [id number]  HPI Ms. Cardo presents to the clinic today for complaints of vaginal discharge.  1) Vaginal discharge  - Has been present for approximately 2 weeks - Described as yellow in coloration with associated odor (patient unable to characterize odor). - Not at risk for STD per patient.  Has 1 female sexual partner  ROS: No vaginal bleeding (patient has had a prior hysterectomy).  No fevers, chills, or abdominal pain.  Does report frequent hot flashes.  Denies dysuria, urinary frequency/urgency. Also reports vaginal dryness and tightness.  Review of Systems See HPI    Objective:   Physical Exam General: well appearing elderly lady in NAD Pelvic Exam:        External: normal female genitalia without lesions or masses        Vagina: normal without lesions or masses.  No discharge appreciated.  Vaginal dryness noted.        Samples for Wet prep.     Assessment:          Plan:

## 2012-08-01 NOTE — Assessment & Plan Note (Addendum)
UA negative and Wet prep negative for BV and yeast.  Physical examination and wet prep, which showed many sheets and clumps of parabasal and basal cells, was consistent with atrophic vaginitis.   Premarin cream sent into patient's pharmacy.   Patient instructed to follow up with Dr. Loraine Maple.

## 2012-08-01 NOTE — Patient Instructions (Addendum)
It was nice seeing you today.  I will call you with the results of your lab studies.  If you symptoms persist please return to the clinic and see Dr. Loraine Maple.

## 2012-08-01 NOTE — Assessment & Plan Note (Signed)
UA unremarkable

## 2012-08-04 ENCOUNTER — Telehealth: Payer: Self-pay | Admitting: Family Medicine

## 2012-08-04 ENCOUNTER — Encounter: Payer: Self-pay | Admitting: Home Health Services

## 2012-08-04 NOTE — Progress Notes (Signed)
Spoke with Mallory Hayes.  Informed patient that she screened negative for DM retinopathy from exam date 06/06/12.  Pt understood.   Follow up photographs in 12 months.

## 2012-08-04 NOTE — Telephone Encounter (Signed)
Will fwd. To Dr.Cook for review. Mallory Hayes, Mallory Hayes

## 2012-08-04 NOTE — Telephone Encounter (Signed)
Patient is calling because the cream that was prescribed was very expensive and the patient would like to try something possibly otc that is much less expensive.

## 2012-08-06 NOTE — Telephone Encounter (Signed)
Patient can try OTC Vagisil.  Patient should follow up with Dr. Loraine Maple

## 2012-09-29 ENCOUNTER — Encounter: Payer: Medicare Other | Admitting: Obstetrics and Gynecology

## 2012-09-30 ENCOUNTER — Other Ambulatory Visit: Payer: Self-pay | Admitting: Surgery

## 2012-10-22 ENCOUNTER — Encounter: Payer: Self-pay | Admitting: Gastroenterology

## 2012-10-27 ENCOUNTER — Other Ambulatory Visit: Payer: Self-pay | Admitting: *Deleted

## 2012-10-27 MED ORDER — METFORMIN HCL ER 500 MG PO TB24: 1000.0000 mg | ORAL_TABLET | Freq: Every day | ORAL | Status: DC

## 2012-11-03 ENCOUNTER — Other Ambulatory Visit: Payer: Self-pay | Admitting: *Deleted

## 2012-11-03 MED ORDER — LISINOPRIL 20 MG PO TABS: 20.0000 mg | ORAL_TABLET | Freq: Every day | ORAL | Status: DC

## 2012-11-06 ENCOUNTER — Other Ambulatory Visit: Payer: Self-pay

## 2012-11-10 ENCOUNTER — Encounter: Payer: Self-pay | Admitting: Gastroenterology

## 2012-11-11 ENCOUNTER — Telehealth: Payer: Self-pay | Admitting: Family Medicine

## 2012-11-12 ENCOUNTER — Encounter: Payer: Self-pay | Admitting: Family Medicine

## 2012-11-12 ENCOUNTER — Ambulatory Visit (INDEPENDENT_AMBULATORY_CARE_PROVIDER_SITE_OTHER): Payer: Medicare Other | Admitting: Family Medicine

## 2012-11-12 VITALS — BP 138/68 | HR 50 | Temp 97.8°F | Ht 62.0 in | Wt 117.0 lb

## 2012-11-12 DIAGNOSIS — E119 Type 2 diabetes mellitus without complications: Secondary | ICD-10-CM

## 2012-11-12 DIAGNOSIS — K5909 Other constipation: Secondary | ICD-10-CM

## 2012-11-12 DIAGNOSIS — I10 Essential (primary) hypertension: Secondary | ICD-10-CM

## 2012-11-12 DIAGNOSIS — F339 Major depressive disorder, recurrent, unspecified: Secondary | ICD-10-CM

## 2012-11-12 LAB — POCT GLYCOSYLATED HEMOGLOBIN (HGB A1C): Hemoglobin A1C: 7.4

## 2012-11-12 MED ORDER — GLIMEPIRIDE 1 MG PO TABS: 1.0000 mg | ORAL_TABLET | Freq: Every day | ORAL | Status: DC

## 2012-11-12 MED ORDER — METFORMIN HCL ER 500 MG PO TB24: 1000.0000 mg | ORAL_TABLET | Freq: Every day | ORAL | Status: DC

## 2012-11-12 NOTE — Patient Instructions (Addendum)
It was nice to meet you today!  For diabetes - I sent in a refill on your metformin and a prescription for a new medicine, called glimepiride. If you have any problems with the medicine, including dizziness, lightheadedness, rash, itching, or any other problems, please stop taking it and call me. If you have a severe reaction to it go to the Emergency Room. Check your blood sugar in the morning before you eat or drink anything and write down the numbers. Bring them in the next time you come to see me.  For mood - You can contact Dr. Gwenlyn Saran, clinical psychologist, to set up an appointment.  She can be reached at [phone].  Let me know if you decide you want to try a medicine for mood.  For constipation - you can try increasing the miralax to twice a day to see if that helps.  Come back in 3 months to follow up on the diabetes. Call if you have any problems in the meantime.  Be well, Dr. Ardelia Mems

## 2012-11-12 NOTE — Telephone Encounter (Signed)
error 

## 2012-11-12 NOTE — Progress Notes (Signed)
Patient ID: Mallory Hayes, female   DOB: 01-20-1946, 67 y.o.   MRN: [id number]   HPI:  Fatigue/depression: Pt reports she has had decreased energy lately. Her mother died several years ago and feelings of grief come back from time to time. She does walk every day for exercise. Denies thoughts of harming herself or others. Denies a history of anemia or thyroid problems.   Diabetes - Stopped glipizide 3 mos ago because she had trouble taking it with food, says that it made her feel "emtpy" after taking it. Is taking metformin currently. Hasn't checked blood sugar at home recently. Denies CP, SOB, dizziness, lightheadedness.   Constipation - has bowel movement every 3 days. Takes miralax daily, but states it has not been working recently. Has an appointment to get a colonoscopy in September. Had one done in 2009 and then in 2011 and had some polyps. Got a call this year that said it was time for her to get another one.   HTN - Checks at store sometimes, gets 120s/50s. Currently taking lisinopril 20mg  daily.  ROS: See HPI - no SOB, CP, dizziness, lightheadedness  PMFSH: hx of sulfa allergy - pt states this was just itching and a rash. Did not have any rash or reaction to glipizide when took it perviously.  PHYSICAL EXAM: BP 138/68  Pulse 50  Temp(Src) 97.8 F (36.6 C) (Oral)  Ht 5\' 2"  (1.575 m)  Wt 117 lb (53.071 kg)  BMI 21.39 kg/m2 Gen: NAD Heart: RRR Lungs: CTAB Abd: soft, nontender to palpation Neuro: grossly nonfocal, speech intact Ext: 2+ DP pulses present bilaterally (unable to perform monofilament exam as could not find monofilament in exam room - will defer to next visit)

## 2012-11-15 NOTE — Assessment & Plan Note (Signed)
A1c up to 7.4 from 6.8, likely related to patient stopping glipizide. She is willing to try to add on another medication within the same class. Will rx glimepiride. Reviewed with pt her hx of sulfa allergy and the possible cross reactivity of sulfonylurea with sulfonamide antibiotics. Her allergy with sulfa drugs is a itchy rash. Advised her to stop taking the glimepiride immediately if she notices any adverse reaction, and to call our office to let us know. Also discussed signs/sx of hypoglycemia and advised pt check a fasting CBG each morning.  Cardiac: on aspirin, statin Renal: on lisinopril Eye: done within last year per epic Foot: will perform at next office visit (unable to find monofilament tester today)

## 2012-11-15 NOTE — Assessment & Plan Note (Signed)
Decreased energy seems most likely related to depression. Not currently on any medications for mood. Pt unsure if she would like to start a medicine, prefers to think about it. Discussed SSRI options with her. Pt given Dr. Tod Persia phone number for counseling. Pt will let me know if she would like to start a medicine.

## 2012-11-15 NOTE — Assessment & Plan Note (Signed)
BP well controlled today (<140/90). Continue current regimen.

## 2012-11-15 NOTE — Assessment & Plan Note (Signed)
Advised patient increase miralax to twice daily and monitor for relief of constipation. She has colonoscopy already scheduled for September, so will not refer to GI due to change in stool habits at this time.

## 2012-12-16 ENCOUNTER — Telehealth: Payer: Self-pay | Admitting: Family Medicine

## 2012-12-16 MED ORDER — LISINOPRIL 20 MG PO TABS: 20.0000 mg | ORAL_TABLET | Freq: Every day | ORAL | Status: DC

## 2012-12-16 NOTE — Telephone Encounter (Signed)
Patient is needing a rx faxed to her new mail order company for Lisinop/HCTZ tab 20-12.5.  [place]  She is wanting this because the other company sent her a large blue pill and she is wanted the smaller yellow pill back.

## 2012-12-16 NOTE — Telephone Encounter (Signed)
Patient is no longer on lisinopril-HCTZ. She is just taking lisinopril. I will send in a new rx for lisinopril, but please call patient to let her know that this is the medicine she should be taking. The HCTZ part was discontinued by Dr. Valentina Lucks earlier this year due to better blood pressures. I can't control what the pills look like or how big they are, but will send in a new rx.  [name], MD

## 2012-12-16 NOTE — Telephone Encounter (Signed)
Will fwd. To PCP. .Mallory Hayes  

## 2012-12-17 NOTE — Telephone Encounter (Signed)
Advised pt of change in med. Pt voiced understanding.

## 2012-12-30 ENCOUNTER — Telehealth: Payer: Self-pay | Admitting: *Deleted

## 2012-12-30 NOTE — Telephone Encounter (Addendum)
[facility] pharmacy calling to request 90-day refill of lisinopril.  Patient only has one tablet left and they are requesting [facility] [address]  Informed that Rx was called in on 12/16/12.  Pharmacy did not receive Rx.  Called in Lisinopril 20 mg #90--take one daily x no refills.  Pharmacy wanted to verify that dose was changed from 40 mg daily to 20 mg daily.  Discussed with Dr. Caralee Ates decreased to 20 mg initially per Drs. McGill/Koval (05/2012).  Also, [facility].   [name], RN

## 2012-12-30 NOTE — Telephone Encounter (Signed)
Pt is requesting lisinopril-hctz 20/12.5 - not listed on MAR - script to go to [facility] [address] - please clarify Tildon Husky, RN-BSN

## 2012-12-31 ENCOUNTER — Ambulatory Visit (AMBULATORY_SURGERY_CENTER): Payer: Commercial Managed Care - HMO

## 2012-12-31 VITALS — Ht 61.0 in | Wt 116.4 lb

## 2012-12-31 DIAGNOSIS — Z860100 Personal history of colon polyps, unspecified: Secondary | ICD-10-CM

## 2012-12-31 MED ORDER — SUPREP BOWEL PREP KIT 17.5-3.13-1.6 GM/177ML PO SOLN: 1.0000 | Freq: Once | ORAL | Status: DC

## 2012-12-31 NOTE — Telephone Encounter (Signed)
Patient is no longer taking the HCTZ component of this medication. This was discontinued by her previous physician. She should just be on lisinopril 20mg  daily. Please call her to inform. If there is still confusion, please message me and I will call her to discuss this. This is now the third phone call we have received in the last month regarding her lisinopril. A 10 day amount of the [facility]opril [facility] by [facility]a F[facility]. [name], MD

## 2012-12-31 NOTE — Telephone Encounter (Signed)
Pt called and meds explained - verbalized understanding - pt was still taking previous meds - informed that she needs to be taking lisinopril without hctz. She will call pharmacy to clarify . Informed [facility]. Tildon Husky, RN-BSN

## 2013-01-01 ENCOUNTER — Encounter: Payer: Self-pay | Admitting: Gastroenterology

## 2013-01-02 ENCOUNTER — Ambulatory Visit (INDEPENDENT_AMBULATORY_CARE_PROVIDER_SITE_OTHER): Payer: Medicare Other | Admitting: Family Medicine

## 2013-01-02 ENCOUNTER — Encounter: Payer: Self-pay | Admitting: Family Medicine

## 2013-01-02 VITALS — BP 146/81 | HR 48 | Temp 98.4°F | Ht 61.0 in | Wt 117.0 lb

## 2013-01-02 DIAGNOSIS — I10 Essential (primary) hypertension: Secondary | ICD-10-CM

## 2013-01-02 DIAGNOSIS — R5381 Other malaise: Secondary | ICD-10-CM

## 2013-01-02 DIAGNOSIS — N951 Menopausal and female climacteric states: Secondary | ICD-10-CM

## 2013-01-02 DIAGNOSIS — R531 Weakness: Secondary | ICD-10-CM

## 2013-01-02 DIAGNOSIS — R232 Flushing: Secondary | ICD-10-CM

## 2013-01-02 DIAGNOSIS — E119 Type 2 diabetes mellitus without complications: Secondary | ICD-10-CM

## 2013-01-02 DIAGNOSIS — K219 Gastro-esophageal reflux disease without esophagitis: Secondary | ICD-10-CM

## 2013-01-02 LAB — CBC WITH DIFFERENTIAL/PLATELET
Hemoglobin: 11.9 g/dL — ABNORMAL LOW (ref 12.0–15.0)
Lymphocytes Relative: 43 % (ref 12–46)
Lymphs Abs: 2.4 10*3/uL (ref 0.7–4.0)
Monocytes Relative: 9 % (ref 3–12)
Neutro Abs: 2.6 10*3/uL (ref 1.7–7.7)
Neutrophils Relative %: 46 % (ref 43–77)
RBC: 4.03 MIL/uL (ref 3.87–5.11)

## 2013-01-02 MED ORDER — LISINOPRIL-HYDROCHLOROTHIAZIDE 20-12.5 MG PO TABS: 1.0000 | ORAL_TABLET | Freq: Every day | ORAL | Status: DC

## 2013-01-02 MED ORDER — PANTOPRAZOLE SODIUM 40 MG PO TBEC: 40.0000 mg | DELAYED_RELEASE_TABLET | Freq: Every day | ORAL | Status: DC

## 2013-01-02 NOTE — Progress Notes (Signed)
Patient ID: Mallory Hayes, female   DOB: 1945/06/02, 67 y.o.   MRN: [id number]   HPI:  Diabetes: Pt reports that she has stopped taking Amaryl. She initially liked it and felt like it gave her more energy, but then she started to feel more fatigued. She started initially with one pill, then switched to 1/2 of a pill, then stopped. She took it for a total of two weeks and stopped it two weeks ago. The fatigue was a sensation of feeling weak in her knees, having to sit down, and feeling very hungry.   Hot flashes: She has felt hot/feverish, and felt weak like she has to eat a lot. Has not checked her temperature at home. Denies night sweats. The hot feeling comes and goes, lasts about 5-10 minutes at a time. It's been present for several weeks but has been more present this week. Her air conditioner is working at home. Last period was in 1991. Denies chest pain, SOB, vision changes, nausea, abdominal pain, palpitations. She otherwise is feeling well except for weakness.   Hypertension: reports that she has not checked her blood pressure recently. There has been some confusion lately over her BP medicine. She was previously taking lisinopril-HCTZ 20-12.5, which we have documented the HCTZ part as having been discontinued due to low BP's, but she states that she was taking this until yesterday. Yesterday started taking just the lisinopril component. She also states that she does not like to take red or blue pills just as a rule. She has some anxiety about taking red or blue pills, cannot articulate it further.  ROS: See HPI  [place]: Hx GERD, requests refill on protonix.  PHYSICAL EXAM: BP 146/81  Pulse 48  Temp(Src) 98.4 F (36.9 C) (Oral)  Ht 5\' 1"  (1.549 m)  Wt 117 lb (53.071 kg)  BMI 22.12 kg/m2 Gen: NAD Heart: RRR Lungs: CTAB, NWOB Neuro: grossly nonfocal, speech intact Psych: somewhat oddly fixated on the red/blue pill topic. Well groomed, normal eye contact

## 2013-01-02 NOTE — Patient Instructions (Addendum)
It was great to see you again today!  I sent in a new prescription for your blood pressure medicine at the dose you were taking before.  I'm checking your thyroid, blood counts, liver, and kidneys today. I will call you if your test results are not normal.  Otherwise, I will send you a letter.  If you do not hear from me with in 2 weeks please call our office.      Check your blood sugar whenever you are feeling bad or "hot". Come back in 1 month to follow up on diabetes and the hot feeling.  Be well, Dr. Ardelia Mems

## 2013-01-03 LAB — COMPREHENSIVE METABOLIC PANEL
Albumin: 4 g/dL (ref 3.5–5.2)
Alkaline Phosphatase: 48 U/L (ref 39–117)
BUN: 20 mg/dL (ref 6–23)
Calcium: 9.7 mg/dL (ref 8.4–10.5)
Chloride: 101 mEq/L (ref 96–112)
Glucose, Bld: 92 mg/dL (ref 70–99)
Potassium: 3.7 mEq/L (ref 3.5–5.3)

## 2013-01-05 ENCOUNTER — Telehealth: Payer: Self-pay | Admitting: Family Medicine

## 2013-01-05 NOTE — Telephone Encounter (Signed)
Pt called and wanted to know if she should still take the lower dose of her medication until the higher dosage come in the mail. JW

## 2013-01-06 NOTE — Telephone Encounter (Signed)
Called pt. Number busy. Please ask pt, WHICH medication ? Thank you. [name], [name]

## 2013-01-12 ENCOUNTER — Other Ambulatory Visit: Payer: Self-pay | Admitting: Family Medicine

## 2013-01-12 MED ORDER — GLUCOSE BLOOD VI STRP: ORAL_STRIP | Status: DC

## 2013-01-12 MED ORDER — LANCET DEVICE MISC: Status: DC

## 2013-01-14 ENCOUNTER — Encounter: Payer: Medicare Other | Admitting: Gastroenterology

## 2013-01-14 ENCOUNTER — Ambulatory Visit (INDEPENDENT_AMBULATORY_CARE_PROVIDER_SITE_OTHER): Payer: Medicare Other | Admitting: *Deleted

## 2013-01-14 VITALS — BP 183/78 | HR 56

## 2013-01-14 DIAGNOSIS — I10 Essential (primary) hypertension: Secondary | ICD-10-CM

## 2013-01-14 NOTE — Progress Notes (Signed)
Patient came in requesting BP check due to "blood pressure very high."  Patient has been taking lisinopril 20 mg daily x 2 weeks instead of lisinopril w/HCTZ.  BP today in office--183/78& P--56.  Patient states she has lisinopril HCTZ at home, but doesn't want to take the "blue pill because it will dye my stomach."  Discussed with Dr. Ardelia Mems.  Patient needs to be on lisinopril HCTZ once daily and return for BP in 1 week.  Patient informed and willing to take "blue pill" for one week to see if it decreases her BP.  [name], RN

## 2013-01-15 ENCOUNTER — Other Ambulatory Visit: Payer: Self-pay | Admitting: Family Medicine

## 2013-01-15 ENCOUNTER — Telehealth: Payer: Self-pay | Admitting: Family Medicine

## 2013-01-15 DIAGNOSIS — E119 Type 2 diabetes mellitus without complications: Secondary | ICD-10-CM

## 2013-01-15 MED ORDER — GLUCOSE BLOOD VI STRP: ORAL_STRIP | Status: DC

## 2013-01-15 MED ORDER — LANCET DEVICE MISC: Status: DC

## 2013-01-15 NOTE — Telephone Encounter (Signed)
Will fwd to Md.  Malakye Nolden, [name], [place]

## 2013-01-15 NOTE — Telephone Encounter (Signed)
Patient is needing prescription for a meter.  True Result Meter-  Please call her when ready

## 2013-01-16 MED ORDER — BLOOD GLUCOSE METER KIT: PACK | Status: DC

## 2013-01-16 NOTE — Assessment & Plan Note (Addendum)
Unclear etiology, reports this has been present for about one month. Patient has been postmenopausal since 1991. No red flags (weight stable since July, no night sweats, SOB, etc). Possible concern for hypoglycemia as she did not tolerate the Amaryl, but timing not consistent (she has not taken in 2 weeks and says the hot feeling is worse this week). Plan: - will check TSH, CBC/diff, and CMET today - check CBG's whenever she has the sensation - f/u in 1 month for OV to see how she is doing.

## 2013-01-16 NOTE — Assessment & Plan Note (Addendum)
BP mildly elevated today, but patient did not take HCTZ component of her anti HTN medicine yesterday. I previously thought she was not taking HCTZ at all, but it turns out she has been taking it consistently until yesterday. Her elevated BP suggests that she does after all need the HCTZ. Will rx lisinopril-HCTZ at previous dose and have her f/u in 1 month for BP check.

## 2013-01-16 NOTE — Telephone Encounter (Signed)
Rx sent to her pharmacy for True Result meter. [name], MD

## 2013-01-16 NOTE — Assessment & Plan Note (Signed)
Did not tolerate amaryl, I am concerned that she possibly was getting hypoglycemic and that this was the cause of her feeling badly on it. Unable to really know since she does not check her blood sugars. Will have her continue off the amaryl and f/u in 1 month which will be time for another A1c.

## 2013-01-21 ENCOUNTER — Ambulatory Visit: Payer: Commercial Managed Care - HMO | Admitting: *Deleted

## 2013-01-21 VITALS — BP 136/76 | HR 51

## 2013-01-21 DIAGNOSIS — I10 Essential (primary) hypertension: Secondary | ICD-10-CM

## 2013-02-05 ENCOUNTER — Other Ambulatory Visit: Payer: Self-pay | Admitting: Family Medicine

## 2013-02-05 MED ORDER — GLUCOSE BLOOD VI STRP: ORAL_STRIP | Status: DC

## 2013-02-05 MED ORDER — LANCET DEVICE MISC: Status: DC

## 2013-02-10 ENCOUNTER — Telehealth: Payer: Self-pay | Admitting: *Deleted

## 2013-02-10 NOTE — Telephone Encounter (Signed)
[employer] [phone]) calling on behalf of patient, patient is requesting specific manufacturers brand of lisinopril (ivax) and they need MD approval for this. Ref [id number] Will forward to MD

## 2013-02-10 NOTE — Telephone Encounter (Signed)
Spoke with Pharmacist with [employer] and he is going to send over a request change for the ivax 20-12.5 over to [place]

## 2013-02-10 NOTE — Telephone Encounter (Signed)
Per review of the patient's chart, there has been some confusion regarding her HTN meds. To simplify, patient was placed on Lisinopril-HCTZ combo.  Therefore, Lisinopril does not need to be sent.  If there are issues surrounding this, patient should wait to discuss this with Dr. Ardelia Mems.

## 2013-02-18 ENCOUNTER — Other Ambulatory Visit: Payer: Self-pay | Admitting: Family Medicine

## 2013-02-18 MED ORDER — METFORMIN HCL ER 500 MG PO TB24: 1000.0000 mg | ORAL_TABLET | Freq: Every day | ORAL | Status: DC

## 2013-02-19 ENCOUNTER — Ambulatory Visit (INDEPENDENT_AMBULATORY_CARE_PROVIDER_SITE_OTHER): Payer: Medicare Other | Admitting: *Deleted

## 2013-02-19 DIAGNOSIS — Z23 Encounter for immunization: Secondary | ICD-10-CM

## 2013-03-20 ENCOUNTER — Other Ambulatory Visit (HOSPITAL_COMMUNITY): Payer: Self-pay | Admitting: Obstetrics and Gynecology

## 2013-03-20 DIAGNOSIS — Z1231 Encounter for screening mammogram for malignant neoplasm of breast: Secondary | ICD-10-CM

## 2013-04-08 ENCOUNTER — Encounter: Payer: Self-pay | Admitting: Podiatrist

## 2013-04-08 ENCOUNTER — Ambulatory Visit (INDEPENDENT_AMBULATORY_CARE_PROVIDER_SITE_OTHER): Payer: Medicare Other | Admitting: Podiatrist

## 2013-04-08 VITALS — BP 139/71 | HR 60 | Resp 18

## 2013-04-08 DIAGNOSIS — M79609 Pain in unspecified limb: Secondary | ICD-10-CM

## 2013-04-08 DIAGNOSIS — B351 Tinea unguium: Secondary | ICD-10-CM

## 2013-04-08 NOTE — Patient Instructions (Addendum)
Cream for dry skin of feet-- Amlactin or LacHydrin  Diabetes and Foot Care Diabetes may cause you to have problems because of poor blood supply (circulation) to your feet and legs. This may cause the skin on your feet to become thinner, break easier, and heal more slowly. Your skin may become dry, and the skin may peel and crack. You may also have nerve damage in your legs and feet causing decreased feeling in them. You may not notice minor injuries to your feet that could lead to infections or more serious problems. Taking care of your feet is one of the most important things you can do for yourself.  HOME CARE INSTRUCTIONS  Wear shoes at all times, even in the house. Do not go barefoot. Bare feet are easily injured.  Check your feet daily for blisters, cuts, and redness. If you cannot see the bottom of your feet, use a mirror or ask someone for help.  Wash your feet with warm water (do not use hot water) and mild soap. Then pat your feet and the areas between your toes until they are completely dry. Do not soak your feet as this can dry your skin.  Apply a moisturizing lotion or petroleum jelly (that does not contain alcohol and is unscented) to the skin on your feet and to dry, brittle toenails. Do not apply lotion between your toes.  Trim your toenails straight across. Do not dig under them or around the cuticle. File the edges of your nails with an emery board or nail file.  Do not cut corns or calluses or try to remove them with medicine.  Wear clean socks or stockings every day. Make sure they are not too tight. Do not wear knee-high stockings since they may decrease blood flow to your legs.  Wear shoes that fit properly and have enough cushioning. To break in new shoes, wear them for just a few hours a day. This prevents you from injuring your feet. Always look in your shoes before you put them on to be sure there are no objects inside.  Do not cross your legs. This may decrease the  blood flow to your feet.  If you find a minor scrape, cut, or break in the skin on your feet, keep it and the skin around it clean and dry. These areas may be cleansed with mild soap and water. Do not cleanse the area with peroxide, alcohol, or iodine.  When you remove an adhesive bandage, be sure not to damage the skin around it.  If you have a wound, look at it several times a day to make sure it is healing.  Do not use heating pads or hot water bottles. They may burn your skin. If you have lost feeling in your feet or legs, you may not know it is happening until it is too late.  Make sure your health care provider performs a complete foot exam at least annually or more often if you have foot problems. Report any cuts, sores, or bruises to your health care provider immediately. SEEK MEDICAL CARE IF:   You have an injury that is not healing.  You have cuts or breaks in the skin.  You have an ingrown nail.  You notice redness on your legs or feet.  You feel burning or tingling in your legs or feet.  You have pain or cramps in your legs and feet.  Your legs or feet are numb.  Your feet always feel cold. SEEK  IMMEDIATE MEDICAL CARE IF:   There is increasing redness, swelling, or pain in or around a wound.  There is a red line that goes up your leg.  Pus is coming from a wound.  You develop a fever or as directed by your health care provider.  You notice a bad smell coming from an ulcer or wound. Document Released: 04/13/2000 Document Revised: 12/17/2012 Document Reviewed: 09/23/2012 [facility] Patient Information 2014 [place].

## 2013-04-08 NOTE — Progress Notes (Signed)
HPI: Patient presents today for follow up of diabetic foot and nail care. Past medical history, meds, and allergies reviewed.   Objective:  Neurovascular status unchanged with palpable pedal pulses and neurological sensation intact.  Patients nails are elongated, thickened, discolored, dystrophic with ingrown deformity present.  + hyperkeratoticlesions present submet 5 bilateral and bilateral heels   Assessment: Diabetes without complication , Ingrown nail deformity   Plan: Discussed treatment options and alternatives. Debrided nails without complication. Debrided hyperkeratotic lesions without complication.  Return appointment recommended at routine intervals of 3 months.   Trudie Buckler, DPM

## 2013-04-17 ENCOUNTER — Ambulatory Visit (HOSPITAL_COMMUNITY)
Admission: RE | Admit: 2013-04-17 | Discharge: 2013-04-17 | Disposition: A | Discharge status: Home / Self Care | Payer: Medicare Other | Source: Ambulatory Visit | Attending: Obstetrics and Gynecology | Admitting: Obstetrics and Gynecology

## 2013-04-17 ENCOUNTER — Ambulatory Visit (HOSPITAL_COMMUNITY): Payer: Medicare Other

## 2013-04-17 DIAGNOSIS — Z1231 Encounter for screening mammogram for malignant neoplasm of breast: Secondary | ICD-10-CM

## 2013-05-12 ENCOUNTER — Other Ambulatory Visit (INDEPENDENT_AMBULATORY_CARE_PROVIDER_SITE_OTHER): Payer: Commercial Managed Care - HMO

## 2013-05-21 ENCOUNTER — Encounter: Payer: Self-pay | Admitting: Family Medicine

## 2013-05-21 ENCOUNTER — Ambulatory Visit (INDEPENDENT_AMBULATORY_CARE_PROVIDER_SITE_OTHER): Payer: Medicare HMO | Admitting: Family Medicine

## 2013-05-21 VITALS — BP 106/74 | HR 60 | Temp 97.5°F | Ht 61.0 in | Wt 116.0 lb

## 2013-05-21 DIAGNOSIS — E7800 Pure hypercholesterolemia, unspecified: Secondary | ICD-10-CM

## 2013-05-21 DIAGNOSIS — I10 Essential (primary) hypertension: Secondary | ICD-10-CM

## 2013-05-21 DIAGNOSIS — E119 Type 2 diabetes mellitus without complications: Secondary | ICD-10-CM

## 2013-05-21 LAB — POCT GLYCOSYLATED HEMOGLOBIN (HGB A1C): Hemoglobin A1C: 7.1

## 2013-05-21 MED ORDER — ROSUVASTATIN CALCIUM 20 MG PO TABS: 20.0000 mg | ORAL_TABLET | Freq: Every day | ORAL | Status: DC

## 2013-05-21 MED ORDER — METFORMIN HCL ER 500 MG PO TB24: 1000.0000 mg | ORAL_TABLET | Freq: Every day | ORAL | Status: DC

## 2013-05-21 NOTE — Progress Notes (Signed)
Patient ID: Mallory Hayes, female   DOB: 1946-01-24, 68 y.o.   MRN: [id number]   HPI:  [id number] taking lisinopril-HCTZ 20-12.5mg  daily. Does not check her BP at home but does check it at the grocery store, usually gets [id number] systolic. Denies having any problems with her medicine. Denies CP, SOB, lightheadedness, or swelling in her legs.   DM: Had eyes checked 2 weeks ago and was told everything was fine. Has been trying to stay away from fried foods. Doing vegetable soup with dinner nightly. Takes metformin XR 1000mg  daily. Previously was on glipizide but did not tolerate this due to perceived side effects. A1c today is 7.1.  HLD: taking crestor 20mg  daily. See above ROS.   ROS: See HPI  [place]: hx HTN & DM  PHYSICAL EXAM: BP 106/74  Pulse 60  Temp(Src) 97.5 F (36.4 C) (Oral)  Ht 5\' 1"  (1.549 m)  Wt 116 lb (52.617 kg)  BMI 21.93 kg/m2 Gen: NAD HEENT: NCAT Heart: RRR Lungs: CTAB, NWOB Abdomen: soft, nontender to palpation Neuro: grossly nonfocal, speech intact Ext: No appreciable lower extremity edema bilaterally. 2+DP pulses bilaterally. Normal monofilament exam bilaterally.  ASSESSMENT/PLAN:  See problem based charting for additional assessment/plan.  FOLLOW UP: F/u in 3 months for diabetes, HLD, and HTN. Needs lipid panel & CBC at that visit.  Leon. Ardelia Mems, [place]

## 2013-05-21 NOTE — Patient Instructions (Signed)
It was great to see you again today!  Your A1c was 7.1 today. This is wonderful! Congratulations. We will continue your medicines as they are right now. Follow up in 3 months for diabetes and blood pressure.  Be well, Dr. Ardelia Mems

## 2013-05-24 NOTE — Assessment & Plan Note (Signed)
A1c well controlled at 7.1 today. Congratulated pt on this, as this is an improvement from previous checks.   Cardiac: on aspirin, statin Renal: on ACE Eye: recent normal eye exam per pt Foot: done today, normal, due again in January 2016

## 2013-05-24 NOTE — Assessment & Plan Note (Signed)
Well-controlled.  Continue current regimen. 

## 2013-05-24 NOTE — Assessment & Plan Note (Signed)
Tolerating crestor. Will continue this medicine. Due for lipid panel at next office visit.

## 2013-06-04 ENCOUNTER — Telehealth: Payer: Self-pay | Admitting: Family Medicine

## 2013-06-04 NOTE — Telephone Encounter (Signed)
Left form to be completed about crestor Please fax when completed

## 2013-06-04 NOTE — Telephone Encounter (Signed)
Pt informed that we don't fill out those forms and I referred her to [facility].  At Patient's request, will mail form back to patient, unsigned and incomplete per our policy.   Mallory Hayes, [name], [place]

## 2013-06-10 ENCOUNTER — Telehealth: Payer: Self-pay | Admitting: Family Medicine

## 2013-06-10 DIAGNOSIS — K5909 Other constipation: Secondary | ICD-10-CM

## 2013-06-10 DIAGNOSIS — E119 Type 2 diabetes mellitus without complications: Secondary | ICD-10-CM

## 2013-06-10 DIAGNOSIS — E7800 Pure hypercholesterolemia, unspecified: Secondary | ICD-10-CM

## 2013-06-10 MED ORDER — METFORMIN HCL ER 500 MG PO TB24: 1000.0000 mg | ORAL_TABLET | Freq: Every day | ORAL | Status: DC

## 2013-06-10 MED ORDER — POLYETHYLENE GLYCOL 3350 17 GM/SCOOP PO POWD: 17.0000 g | Freq: Every day | ORAL | Status: DC | PRN

## 2013-06-10 MED ORDER — ROSUVASTATIN CALCIUM 20 MG PO TABS: 20.0000 mg | ORAL_TABLET | Freq: Every day | ORAL | Status: DC

## 2013-06-10 NOTE — Telephone Encounter (Signed)
Rx's sent in to her new pharmacy. I did not send in a prescription for a glucose meter because if she is only on metformin for diabetes, she does not need to check her sugars at home.  [place] red team, please inform patient. Thanks! [name], MD

## 2013-06-10 NOTE — Telephone Encounter (Signed)
Patient has changed the pharmacy that she is getting her medications through.  She dropped off the information and the prescriptions that she is needing refilled.  I placed the papers in her box.  She said you can fax to them.

## 2013-06-11 NOTE — Telephone Encounter (Signed)
Called pt.  1.) I explained message to pt. Pt still would like to have a Rx for the Glucometer send to her pharmacy. She used to have a glucometer and she needs a new one. Request to send glucometer of her choice to her pharmacy. 2.) pt request Rx for Crestor. She would like to pick up the hard copy. I told the pt that it was sent to her mail order. She does not want it from the mail order, because she can get it cheaper at [facility]. I told the pt that she has to call the mail order to stop the order. I will ask Dr.McIntyre to print out a Rx and we will call her, once it is ready. Pt agreed. Fwd. To PCP .[place]

## 2013-06-11 NOTE — Telephone Encounter (Signed)
Called pt.

## 2013-06-18 MED ORDER — ROSUVASTATIN CALCIUM 20 MG PO TABS: 20.0000 mg | ORAL_TABLET | Freq: Every day | ORAL | Status: DC

## 2013-06-18 MED ORDER — BLOOD GLUCOSE METER KIT: PACK | Status: DC

## 2013-06-18 NOTE — Telephone Encounter (Signed)
Okay, rx for glucometer sent in to pharmacy. I also printed off her rx for crestor and will leave it at the front desk for her to pick up. Please call her to let her know that it is available to her, and please also apologize to her for the delay, as I have been out on vacation.  Thanks! [name], MD

## 2013-06-19 NOTE — Telephone Encounter (Signed)
Called pt. Informed pt. Mallory Hayes, Mallory Hayes

## 2013-07-08 ENCOUNTER — Ambulatory Visit: Payer: Medicare Other | Admitting: Podiatrist

## 2013-07-22 ENCOUNTER — Telehealth: Payer: Self-pay | Admitting: Family Medicine

## 2013-07-22 NOTE — Telephone Encounter (Signed)
LMVM returning patient's call.  Mallory Hayes, [name], [name]

## 2013-07-22 NOTE — Telephone Encounter (Signed)
Pt called and would like Dr. Ardelia Mems to call her concerning her symptoms, jw

## 2013-07-23 ENCOUNTER — Encounter: Payer: Self-pay | Admitting: Podiatrist

## 2013-07-23 ENCOUNTER — Ambulatory Visit (INDEPENDENT_AMBULATORY_CARE_PROVIDER_SITE_OTHER): Payer: Medicare HMO | Admitting: Podiatrist

## 2013-07-23 VITALS — BP 147/83 | HR 60 | Resp 16

## 2013-07-23 DIAGNOSIS — M79609 Pain in unspecified limb: Secondary | ICD-10-CM

## 2013-07-23 DIAGNOSIS — B351 Tinea unguium: Secondary | ICD-10-CM

## 2013-07-23 DIAGNOSIS — E119 Type 2 diabetes mellitus without complications: Secondary | ICD-10-CM

## 2013-07-23 NOTE — Patient Instructions (Signed)
Diabetes and Foot Care Diabetes may cause you to have problems because of poor blood supply (circulation) to your feet and legs. This may cause the skin on your feet to become thinner, break easier, and heal more slowly. Your skin may become dry, and the skin may peel and crack. You may also have nerve damage in your legs and feet causing decreased feeling in them. You may not notice minor injuries to your feet that could lead to infections or more serious problems. Taking care of your feet is one of the most important things you can do for yourself.  HOME CARE INSTRUCTIONS  Wear shoes at all times, even in the house. Do not go barefoot. Bare feet are easily injured.  Check your feet daily for blisters, cuts, and redness. If you cannot see the bottom of your feet, use a mirror or ask someone for help.  Wash your feet with warm water (do not use hot water) and mild soap. Then pat your feet and the areas between your toes until they are completely dry. Do not soak your feet as this can dry your skin.  Apply a moisturizing lotion or petroleum jelly (that does not contain alcohol and is unscented) to the skin on your feet and to dry, brittle toenails. Do not apply lotion between your toes.  Trim your toenails straight across. Do not dig under them or around the cuticle. File the edges of your nails with an emery board or nail file.  Do not cut corns or calluses or try to remove them with medicine.  Wear clean socks or stockings every day. Make sure they are not too tight. Do not wear knee-high stockings since they may decrease blood flow to your legs.  Wear shoes that fit properly and have enough cushioning. To break in new shoes, wear them for just a few hours a day. This prevents you from injuring your feet. Always look in your shoes before you put them on to be sure there are no objects inside.  Do not cross your legs. This may decrease the blood flow to your feet.  If you find a minor scrape,  cut, or break in the skin on your feet, keep it and the skin around it clean and dry. These areas may be cleansed with mild soap and water. Do not cleanse the area with peroxide, alcohol, or iodine.  When you remove an adhesive bandage, be sure not to damage the skin around it.  If you have a wound, look at it several times a day to make sure it is healing.  Do not use heating pads or hot water bottles. They may burn your skin. If you have lost feeling in your feet or legs, you may not know it is happening until it is too late.  Make sure your health care provider performs a complete foot exam at least annually or more often if you have foot problems. Report any cuts, sores, or bruises to your health care provider immediately. SEEK MEDICAL CARE IF:   You have an injury that is not healing.  You have cuts or breaks in the skin.  You have an ingrown nail.  You notice redness on your legs or feet.  You feel burning or tingling in your legs or feet.  You have pain or cramps in your legs and feet.  Your legs or feet are numb.  Your feet always feel cold. SEEK IMMEDIATE MEDICAL CARE IF:   There is increasing redness,   swelling, or pain in or around a wound.  There is a red line that goes up your leg.  Pus is coming from a wound.  You develop a fever or as directed by your health care provider.  You notice a bad smell coming from an ulcer or wound. Document Released: 04/13/2000 Document Revised: 12/17/2012 Document Reviewed: 09/23/2012 [employer] Patient Information 2014 [employer].  

## 2013-07-23 NOTE — Progress Notes (Signed)
HPI: Patient presents today for follow up of diabetic foot and nail care. Past medical history, meds, and allergies reviewed.    Objective: Neurovascular status unchanged with palpable pedal pulses and neurological sensation intact. Patients nails are elongated, thickened, discolored, dystrophic with ingrown deformity present. + hyperkeratoticlesions present submet 5 bilateral and bilateral heels    Assessment: Diabetes without complication , Ingrown nail deformity   Plan: Discussed treatment options and alternatives. Debrided nails without complication. Debrided hyperkeratotic lesions without complication. Return appointment recommended at routine intervals of 3 months.

## 2013-07-24 ENCOUNTER — Ambulatory Visit (INDEPENDENT_AMBULATORY_CARE_PROVIDER_SITE_OTHER): Payer: Commercial Managed Care - HMO | Admitting: Family Medicine

## 2013-07-24 VITALS — BP 159/62 | HR 55 | Temp 98.5°F | Ht 61.0 in | Wt 127.0 lb

## 2013-07-24 DIAGNOSIS — B349 Viral infection, unspecified: Secondary | ICD-10-CM

## 2013-07-24 DIAGNOSIS — B9789 Other viral agents as the cause of diseases classified elsewhere: Secondary | ICD-10-CM

## 2013-07-24 NOTE — Assessment & Plan Note (Signed)
Patient presents with signs/symptoms consistent with viral illness -Conservative treatment discussed as outlined in the patient instruction section.

## 2013-07-24 NOTE — Progress Notes (Signed)
   Subjective:    Patient ID: Mallory Hayes, female    DOB: [birth year], 68 y.o.   MRN: [id number]  HPI 68 y/o female presents with two day history of cough, non productive of sputum, has associated runny nose and nasal congestion, no fevers or chills, no sore throat, she thinks that she was exposed to a viral illness last week at church, she has not taken any over the counter cough or cold medications, no nausea, no emesis, no abdominal pain, no diarrhea, no sob   Review of Systems  Constitutional: Negative for fever.  HENT: Positive for congestion and rhinorrhea.   Respiratory: Positive for cough. Negative for shortness of breath.   Cardiovascular: Negative for chest pain and leg swelling.  Gastrointestinal: Negative for nausea, vomiting and diarrhea.       Objective:   Physical Exam Vitals: Reviewed General: Pleasant African American female, no acute distress HEENT: Normal cephalic, pupils are equal round and reactive to light, extraocular movements are intact, no scleral icterus, rhinorrhea present, bilateral TMs are pearly-gray, moist mucous membranes, no pharyngeal erythema or exudate noted, neck was supple, no anterior or posterior cervical lymphadenopathy Cardiac: Regular rate and rhythm, S1 and S2 present, no murmurs, no heaves or thrills Respiratory: Clear to patient bilaterally, normal effort Abdomen: Soft, nontender, bowel sounds present Skin: No rash    Assessment & Plan:  Please see problem specific assessment and plan.

## 2013-07-24 NOTE — Patient Instructions (Signed)
Viral Infections A virus is a type of germ. Viruses can cause:  Minor sore throats.  Aches and pains.  Headaches.  Runny nose.  Rashes.  Watery eyes.  Tiredness.  Coughs.  Loss of appetite.  Feeling sick to your stomach (nausea).  Throwing up (vomiting).  Watery poop (diarrhea). HOME CARE   Only take medicines as told by your doctor.  Drink enough water and fluids to keep your pee (urine) clear or pale yellow. Sports drinks are a good choice.  Get plenty of rest and eat healthy. Soups and broths with crackers or rice are fine. GET HELP RIGHT AWAY IF:   You have a very bad headache.  You have shortness of breath.  You have chest pain or neck pain.  You have an unusual rash.  You cannot stop throwing up.  You have watery poop that does not stop.  You cannot keep fluids down.  You or your child has a temperature by mouth above 102 F (38.9 C), not controlled by medicine.  Your baby is older than 3 months with a rectal temperature of 102 F (38.9 C) or higher.  Your baby is 71 months old or younger with a rectal temperature of 100.4 F (38 C) or higher. MAKE SURE YOU:   Understand these instructions.  Will watch this condition.  Will get help right away if you are not doing well or get worse. Document Released: 03/29/2008 Document Revised: 07/09/2011 Document Reviewed: 08/22/2010 [facility] Patient Information 2014 [place], [place].  May use Mucinex and Nasal Saline Rinses to help with congestion.

## 2013-08-13 ENCOUNTER — Other Ambulatory Visit: Payer: Self-pay | Admitting: Family Medicine

## 2013-08-19 ENCOUNTER — Encounter: Payer: Self-pay | Admitting: Family Medicine

## 2013-08-19 NOTE — Progress Notes (Signed)
Pt need Lisiniop/Hctz Tab 20.12.5 for high blood pressure filled at [facility] on [address].

## 2013-08-20 ENCOUNTER — Other Ambulatory Visit: Payer: Self-pay | Admitting: Family Medicine

## 2013-08-20 MED ORDER — LISINOPRIL-HYDROCHLOROTHIAZIDE 20-12.5 MG PO TABS: 1.0000 | ORAL_TABLET | Freq: Every day | ORAL | Status: DC

## 2013-08-20 NOTE — Progress Notes (Signed)
Rx sent in. Thanks! Shericka Johnstone J Sajad Glander, MD  

## 2013-09-16 ENCOUNTER — Ambulatory Visit (INDEPENDENT_AMBULATORY_CARE_PROVIDER_SITE_OTHER): Payer: Commercial Managed Care - HMO | Admitting: Family Medicine

## 2013-09-16 ENCOUNTER — Encounter: Payer: Self-pay | Admitting: Family Medicine

## 2013-09-16 VITALS — BP 161/76 | HR 53 | Temp 98.1°F | Wt 122.0 lb

## 2013-09-16 DIAGNOSIS — E119 Type 2 diabetes mellitus without complications: Secondary | ICD-10-CM

## 2013-09-16 DIAGNOSIS — D649 Anemia, unspecified: Secondary | ICD-10-CM

## 2013-09-16 DIAGNOSIS — Z7689 Persons encountering health services in other specified circumstances: Secondary | ICD-10-CM

## 2013-09-16 DIAGNOSIS — Z72820 Sleep deprivation: Secondary | ICD-10-CM

## 2013-09-16 DIAGNOSIS — E7800 Pure hypercholesterolemia, unspecified: Secondary | ICD-10-CM

## 2013-09-16 DIAGNOSIS — I10 Essential (primary) hypertension: Secondary | ICD-10-CM

## 2013-09-16 LAB — POCT GLYCOSYLATED HEMOGLOBIN (HGB A1C): HEMOGLOBIN A1C: 8.2

## 2013-09-16 MED ORDER — BLOOD PRESSURE MONITOR DEVI: Status: DC

## 2013-09-16 NOTE — Progress Notes (Signed)
Patient ID: Mallory Hayes, female   DOB: 1945-06-13, 68 y.o.   MRN: [id number]  HPI:  DM - currently taking metformin XR 1000mg  daily. A1c elevated today compared to last time (8.2 up from 7.1). Pt thinks this is because she's been eating snickers bar three times daily. She was anticipating that her A1c would be higher from this. Thinks the rest of her diet is fine for her diabetes.   HTN: taking lisinopril-HCTZ 20-12.5mg  daily. Does not check BP at home. She took her BP meds today.   Sleep trouble: she doesn't sleep well. Wakes up about every 2 hours. Goes to sleep at 6pm, wakes up 2 hours later for about an hour, then lays down again for another 2 hours. Also wakes up in the middle of the night and is up for 10 mins at a time. Denies headaches, snoring, daytime sleepiness. She falls back asleep pretty easily. She identifies being concerned about living by herself and fear of someone breaking in as being the reason she wakes up so frequently. Does not have a bed partner.  ROS: See HPI. Denies CP, SOB, swelling, syncope.  [place]: goes to ob/gyn yearly for pap smears  PHYSICAL EXAM: BP 161/76  Pulse 53  Temp(Src) 98.1 F (36.7 C) (Oral)  Wt 122 lb (55.339 kg) BP on recheck: 123/96. Gen: NAD HEENT: NCAT Heart: RRR, no murmurs Lungs: CTAB, NWOB Abdomen: soft, NTTP Neuro: grossly nonfocal, speech normal Ext: No appreciable lower extremity edema bilaterally   ASSESSMENT/PLAN:  See problem based charting for additional assessment/plan.  FOLLOW UP: F/u in 3 months for chronic medical problems.  [place]. Ardelia Mems, [place]

## 2013-09-16 NOTE — Patient Instructions (Addendum)
It was great to see you again today!  Follow up in 3 months. Work on diet (cutting back on snickers bar and salt intake).  Check your blood pressure a few times a week at home. If you get >140/90 regularly, come back in sooner.  On your way out, schedule an appointment one morning to come back for fasting labs. Do not eat or drink anything other than water the morning of your lab appointment until after your labs are drawn.   See sleep hygiene handout.  Be well, Dr. Ardelia Mems   DASH Diet [employer] [place] stands for "Dietary Approaches to Stop Hypertension." It is a healthy eating plan that has been shown to reduce high blood pressure (hypertension) in as little as 14 days, while also possibly providing other significant health benefits. These other health benefits include reducing the risk of breast cancer after menopause and reducing the risk of type 2 diabetes, heart disease, colon cancer, and stroke. Health benefits also include weight loss and slowing kidney failure in patients with chronic kidney disease.  DIET GUIDELINES  Limit salt (sodium). Your diet should contain less than 1500 mg of sodium daily.  Limit refined or processed carbohydrates. Your diet should include mostly whole grains. Desserts and added sugars should be used sparingly.  Include small amounts of heart-healthy fats. These types of fats include nuts, oils, and tub margarine. Limit saturated and trans fats. These fats have been shown to be harmful in the body. CHOOSING FOODS  The following food groups are based on a 2000 calorie diet. See your Registered Dietitian for individual calorie needs. Grains and Grain Products (6 to 8 servings daily)  Eat More Often: Whole-wheat bread, brown rice, whole-grain or wheat pasta, quinoa, popcorn without added fat or salt (air popped).  Eat Less Often: White bread, white pasta, white rice, cornbread. Vegetables (4 to 5 servings daily)  Eat More Often: Fresh, frozen, and canned  vegetables. Vegetables may be raw, steamed, roasted, or grilled with a minimal amount of fat.  Eat Less Often/Avoid: Creamed or fried vegetables. Vegetables in a cheese sauce. Fruit (4 to 5 servings daily)  Eat More Often: All fresh, canned (in natural juice), or frozen fruits. Dried fruits without added sugar. One hundred percent fruit juice ( cup [237 mL] daily).  Eat Less Often: Dried fruits with added sugar. Canned fruit in light or heavy syrup. [employer], Fish, and Poultry (2 servings or less daily. One serving is 3 to 4 oz [85-114 g]).  Eat More Often: Ninety percent or leaner ground beef, tenderloin, sirloin. Round cuts of beef, chicken breast, [place] breast. All fish. Grill, bake, or broil your meat. Nothing should be fried.  Eat Less Often/Avoid: Fatty cuts of meat, [place], or chicken leg, thigh, or wing. Fried cuts of meat or fish. Dairy (2 to 3 servings)  Eat More Often: Low-fat or fat-free milk, low-fat plain or light yogurt, reduced-fat or part-skim cheese.  Eat Less Often/Avoid: Milk (whole, 2%).Whole milk yogurt. Full-fat cheeses. Nuts, Seeds, and Legumes (4 to 5 servings per week)  Eat More Often: All without added salt.  Eat Less Often/Avoid: Salted nuts and seeds, canned beans with added salt. Fats and Sweets (limited)  Eat More Often: Vegetable oils, tub margarines without trans fats, sugar-free gelatin. Mayonnaise and salad dressings.  Eat Less Often/Avoid: Coconut oils, palm oils, butter, stick margarine, cream, half and half, cookies, candy, pie. FOR MORE INFORMATION The Dash Diet Eating Plan: www.dashdiet.org Document Released: 04/05/2011 Document Revised: 07/09/2011 Document  Reviewed: 04/05/2011 ExitCare Patient Information 2014 [place], [place].

## 2013-09-21 DIAGNOSIS — Z7689 Persons encountering health services in other specified circumstances: Secondary | ICD-10-CM | POA: Insufficient documentation

## 2013-09-21 NOTE — Assessment & Plan Note (Signed)
Suspect secondary to poor sleep hygiene. No history concerning for sleep apnea. Plan: -gave handout on sleep hygiene -discussed getting security system installed or turning on white noise (fan, etc) to help with reassurance during sleep.

## 2013-09-21 NOTE — Assessment & Plan Note (Signed)
BP mildly elevated on recheck (123/96). Will have pt check several x/week at home and come in if it is regularly >140/90. Gave handout on DASH diet as well. F/u in 3 mos.

## 2013-09-21 NOTE — Assessment & Plan Note (Signed)
A1c 8.2, up from 7.1 in January 2015. Suspect due to decreased compliance with diet. Will continue current medications and have pt work on diabetic diet, f/u in 3 mos for recheck A1c.   Cardiac: on aspirin, statin Renal: on ACE Eye: due for eye exam Jan 2016 Foot: exam due Jan 2016.

## 2013-09-21 NOTE — Assessment & Plan Note (Signed)
Check lipids at fasting lab visit to monitor for compliance with therapy.

## 2013-10-01 ENCOUNTER — Other Ambulatory Visit: Payer: Commercial Managed Care - HMO

## 2013-10-01 DIAGNOSIS — E119 Type 2 diabetes mellitus without complications: Secondary | ICD-10-CM

## 2013-10-01 DIAGNOSIS — D649 Anemia, unspecified: Secondary | ICD-10-CM

## 2013-10-01 LAB — COMPREHENSIVE METABOLIC PANEL
ALT: 12 U/L (ref 0–35)
AST: 17 U/L (ref 0–37)
Albumin: 4.2 g/dL (ref 3.5–5.2)
Alkaline Phosphatase: 50 U/L (ref 39–117)
BILIRUBIN TOTAL: 0.5 mg/dL (ref 0.2–1.2)
BUN: 18 mg/dL (ref 6–23)
CALCIUM: 9.7 mg/dL (ref 8.4–10.5)
CHLORIDE: 98 meq/L (ref 96–112)
CO2: 27 mEq/L (ref 19–32)
Creat: 1.14 mg/dL — ABNORMAL HIGH (ref 0.50–1.10)
Glucose, Bld: 124 mg/dL — ABNORMAL HIGH (ref 70–99)
Potassium: 3.5 mEq/L (ref 3.5–5.3)
Sodium: 136 mEq/L (ref 135–145)
Total Protein: 7.2 g/dL (ref 6.0–8.3)

## 2013-10-01 LAB — LIPID PANEL
CHOLESTEROL: 141 mg/dL (ref 0–200)
HDL: 58 mg/dL (ref >39)
LDL Cholesterol: 74 mg/dL (ref 0–99)
Total CHOL/HDL Ratio: 2.4 Ratio
Triglycerides: 47 mg/dL (ref <150)
VLDL: 9 mg/dL (ref 0–40)

## 2013-10-01 LAB — CBC
HCT: 36.5 % (ref 36.0–46.0)
HEMOGLOBIN: 12.6 g/dL (ref 12.0–15.0)
MCH: 30.6 pg (ref 26.0–34.0)
MCHC: 34.5 g/dL (ref 30.0–36.0)
MCV: 88.6 fL (ref 78.0–100.0)
Platelets: 173 10*3/uL (ref 150–400)
RBC: 4.12 MIL/uL (ref 3.87–5.11)
RDW: 13.5 % (ref 11.5–15.5)
WBC: 4.5 10*3/uL (ref 4.0–10.5)

## 2013-10-01 NOTE — Progress Notes (Signed)
CMP,CBC AND FLP DONE TODAY Mallory Hayes

## 2013-10-06 ENCOUNTER — Ambulatory Visit: Payer: Commercial Managed Care - HMO | Admitting: Home Health Services

## 2013-10-08 ENCOUNTER — Encounter: Payer: Self-pay | Admitting: Family Medicine

## 2013-10-22 ENCOUNTER — Ambulatory Visit (INDEPENDENT_AMBULATORY_CARE_PROVIDER_SITE_OTHER): Payer: Medicare HMO | Admitting: Podiatrist

## 2013-10-22 DIAGNOSIS — B351 Tinea unguium: Secondary | ICD-10-CM

## 2013-10-22 DIAGNOSIS — M79673 Pain in unspecified foot: Secondary | ICD-10-CM

## 2013-10-22 DIAGNOSIS — M79609 Pain in unspecified limb: Secondary | ICD-10-CM

## 2013-10-22 NOTE — Progress Notes (Signed)
HPI: Patient presents today for follow up of diabetic foot and nail care. Past medical history, meds, and allergies reviewed.   Objective: Neurovascular status unchanged with palpable pedal pulses and neurological sensation intact. Patients nails are elongated, thickened, discolored, dystrophic with ingrown deformity present. + hyperkeratotic lesions present submet 5 bilateral   Assessment: Diabetes without complication , Ingrown nail deformity    Plan: Discussed treatment options and alternatives. Debrided nails without complication. Debrided hyperkeratotic lesions without complication. Return appointment recommended at routine intervals of 3 months.

## 2013-11-18 ENCOUNTER — Other Ambulatory Visit: Payer: Self-pay | Admitting: *Deleted

## 2013-11-18 DIAGNOSIS — K5909 Other constipation: Secondary | ICD-10-CM

## 2013-11-20 MED ORDER — POLYETHYLENE GLYCOL 3350 17 GM/SCOOP PO POWD: 17.0000 g | Freq: Every day | ORAL | Status: DC | PRN

## 2013-11-30 ENCOUNTER — Other Ambulatory Visit: Payer: Self-pay | Admitting: *Deleted

## 2013-11-30 MED ORDER — METFORMIN HCL ER 500 MG PO TB24: 1000.0000 mg | ORAL_TABLET | Freq: Every day | ORAL | Status: DC

## 2013-12-04 ENCOUNTER — Other Ambulatory Visit: Payer: Self-pay | Admitting: Family Medicine

## 2013-12-04 ENCOUNTER — Encounter: Payer: Self-pay | Admitting: Family Medicine

## 2013-12-04 DIAGNOSIS — K5909 Other constipation: Secondary | ICD-10-CM

## 2013-12-04 MED ORDER — POLYETHYLENE GLYCOL 3350 17 GM/SCOOP PO POWD: 17.0000 g | Freq: Every day | ORAL | Status: DC | PRN

## 2013-12-04 MED ORDER — METFORMIN HCL ER 500 MG PO TB24: 1000.0000 mg | ORAL_TABLET | Freq: Every day | ORAL | Status: DC

## 2013-12-04 NOTE — Progress Notes (Signed)
Rx sent 

## 2013-12-04 NOTE — Progress Notes (Unsigned)
Patient is almost out of her Metformin and Polyethylene.  She will be out in a week.  She would like to have prescriptions for these sent to [facility] (through [facility])  Fax [phone]

## 2013-12-22 ENCOUNTER — Ambulatory Visit: Payer: Commercial Managed Care - HMO | Admitting: Family Medicine

## 2013-12-29 ENCOUNTER — Encounter: Payer: Self-pay | Admitting: Family Medicine

## 2013-12-29 ENCOUNTER — Ambulatory Visit (INDEPENDENT_AMBULATORY_CARE_PROVIDER_SITE_OTHER): Payer: Medicare HMO | Admitting: Family Medicine

## 2013-12-29 VITALS — BP 148/63 | HR 52 | Temp 98.3°F | Wt 118.0 lb

## 2013-12-29 DIAGNOSIS — Z8041 Family history of malignant neoplasm of ovary: Secondary | ICD-10-CM

## 2013-12-29 DIAGNOSIS — E119 Type 2 diabetes mellitus without complications: Secondary | ICD-10-CM

## 2013-12-29 DIAGNOSIS — I10 Essential (primary) hypertension: Secondary | ICD-10-CM

## 2013-12-29 LAB — POCT GLYCOSYLATED HEMOGLOBIN (HGB A1C): Hemoglobin A1C: 7.4

## 2013-12-29 MED ORDER — BLOOD PRESSURE MONITOR DEVI: Status: DC

## 2013-12-29 MED ORDER — LISINOPRIL-HYDROCHLOROTHIAZIDE 20-12.5 MG PO TABS: 2.0000 | ORAL_TABLET | Freq: Every day | ORAL | Status: DC

## 2013-12-29 NOTE — Patient Instructions (Addendum)
It was great to see you again today!  Diabetes: -your a1c is excellent today -continue current medications -follow up in 3 months  Blood pressure: -get a blood pressure cuff and check BP 3x weekly -return if BP consistently over 140 on top or 90 on bottom -increase lisinopril-HCTZ to 2 tablets once a day. -return in 2 weeks to recheck your kidney function (lab visit) and have a nurse check your blood pressure (nurse visit)  Ovarian cancer screening: -I am referring you to a genetic counselor to talk about screening for ovarian cancer since you have a family history. You will get a phone call to schedule this appointment.   Flu shot: -get this when you come back for the nurse visit/lab visit  Be well, Dr. Ardelia Mems

## 2014-01-04 DIAGNOSIS — Z8041 Family history of malignant neoplasm of ovary: Secondary | ICD-10-CM | POA: Insufficient documentation

## 2014-01-04 NOTE — Assessment & Plan Note (Signed)
A1c improved today to 7.4. Continue metformin at present dose.  Cardiac: on statin, aspirin Renal: on ACE Eye: due Jan 2016 Foot: due Jan 2016

## 2014-01-04 NOTE — Assessment & Plan Note (Signed)
BP elevated today, even on recheck. Will double BP med to lisinopril-HCTZ 20-12.5mg  TWO tablets daily. F/u in 2 weeks for BP recheck and BMET.

## 2014-01-04 NOTE — Progress Notes (Signed)
Patient ID: Mallory Hayes, female   DOB: February 02, 1946, 68 y.o.   MRN: [id number]  HPI:  Diabetes: currently taking metformin XR 1000mg  daily with breakfast. Does not check blood sugar at home. Due for eye exam in Jan 2016, also for foot exam at that time. Denies chest pain, shortness of breath, lower extremity edema, vision problems.  HTN: currently taking lisinopril-HCTZ 20-12.5mg  one tab daily. See above ROS. Thinks BP is running high.  Family hx ovarian cancer: mom had ovarian cancer dx'd at age 69. Also has maternal cousin with breast cancer and maternal grandfather with pancreatic cancer. No aunts with hx of breast or ovarian cancer. Mom reportedly did not have any genetic tesitng done.   ROS: See HPI.  [employer]: hx T2DM, HLD, dep/anxiety, HTN, GERD  PHYSICAL EXAM: BP 148/63  Pulse 52  Temp(Src) 98.3 F (36.8 C) (Oral)  Wt 118 lb (53.524 kg) BP on recheck: 164/82 Gen: NAD HEENT: NCAT Heart: RRR no murmurs Lungs: CTAB, NWOB Neuro: grossly nonfocal speech normal Ext: No appreciable lower extremity edema bilaterally   ASSESSMENT/PLAN:  Health maintenance:  -pt would like flu vaccine, will return  Family history of ovarian cancer Refer to genetic counseling given family hx of ovarian and breast cancer. Will await genetics recommendations about whether to pursue any genetic testing.  HYPERTENSION, BENIGN SYSTEMIC BP elevated today, even on recheck. Will double BP med to lisinopril-HCTZ 20-12.5mg  TWO tablets daily. F/u in 2 weeks for BP recheck and BMET.   DM, UNCOMPLICATED, TYPE II 123456 improved today to 7.4. Continue metformin at present dose.  Cardiac: on statin, aspirin Renal: on ACE Eye: due Jan 2016 Foot: due Jan 2016    FOLLOW UP: F/u in 2 weeks for BP recheck with nurse visit & lab for BMET. Referring to genetic counseling for hx ovarian cancer F/u with me in office in 3 mos for routine medical problems.  Mallory Hayes. Mallory Hayes, Mallory Hayes

## 2014-01-04 NOTE — Assessment & Plan Note (Signed)
Refer to genetic counseling given family hx of ovarian and breast cancer. Will await genetics recommendations about whether to pursue any genetic testing.

## 2014-01-05 ENCOUNTER — Encounter: Payer: Self-pay | Admitting: Gastroenterology

## 2014-01-12 ENCOUNTER — Encounter: Payer: Self-pay | Admitting: Gastroenterology

## 2014-01-20 ENCOUNTER — Ambulatory Visit (AMBULATORY_SURGERY_CENTER): Payer: Self-pay

## 2014-01-20 VITALS — Ht 61.5 in | Wt 120.4 lb

## 2014-01-20 DIAGNOSIS — Z860100 Personal history of colon polyps, unspecified: Secondary | ICD-10-CM

## 2014-01-20 MED ORDER — SUPREP BOWEL PREP KIT 17.5-3.13-1.6 GM/177ML PO SOLN: 1.0000 | Freq: Once | ORAL | Status: DC

## 2014-01-20 NOTE — Progress Notes (Signed)
No allergies to eggs or soy No past problems with anesthesia No home oxygen No diet/weight loss meds  Has email  Emmi instructions given for colonoscopy 

## 2014-01-28 ENCOUNTER — Ambulatory Visit (INDEPENDENT_AMBULATORY_CARE_PROVIDER_SITE_OTHER): Payer: Medicare HMO | Admitting: Podiatrist

## 2014-01-28 DIAGNOSIS — B351 Tinea unguium: Secondary | ICD-10-CM

## 2014-01-28 DIAGNOSIS — M79676 Pain in unspecified toe(s): Secondary | ICD-10-CM

## 2014-01-28 NOTE — Progress Notes (Signed)
HPI: Patient presents today for follow up of diabetic foot and nail care. Past medical history, meds, and allergies reviewed.  Objective: Neurovascular status unchanged with palpable pedal pulses and neurological sensation intact. Patients nails are elongated, thickened, discolored, dystrophic with ingrown deformity present. Improved pain is noted with new shoes submet 5 bilateral  Assessment: Diabetes without complication , Ingrown nail deformity , symptomatic mycotic toenails  Plan: Discussed treatment options and alternatives. Debrided nails without complication. Debrided hyperkeratotic lesions without complication. Return appointment recommended at routine intervals of 3 months.

## 2014-01-29 ENCOUNTER — Encounter: Payer: Self-pay | Admitting: Gastroenterology

## 2014-01-29 ENCOUNTER — Telehealth: Payer: Self-pay | Admitting: Gastroenterology

## 2014-02-01 ENCOUNTER — Telehealth: Payer: Self-pay | Admitting: Family Medicine

## 2014-02-01 NOTE — Telephone Encounter (Signed)
Needs referral to [facility]. Having a colonoscopy on 02-03-14 Fax: [phone]

## 2014-02-01 NOTE — Telephone Encounter (Signed)
No silverback authorization required, patient informed.

## 2014-02-03 ENCOUNTER — Encounter: Payer: Self-pay | Admitting: Gastroenterology

## 2014-02-03 ENCOUNTER — Ambulatory Visit (AMBULATORY_SURGERY_CENTER): Payer: Commercial Managed Care - HMO | Admitting: Gastroenterology

## 2014-02-03 VITALS — BP 137/59 | HR 49 | Temp 96.4°F | Resp 16 | Ht 61.0 in | Wt 120.0 lb

## 2014-02-03 DIAGNOSIS — D124 Benign neoplasm of descending colon: Secondary | ICD-10-CM

## 2014-02-03 DIAGNOSIS — D123 Benign neoplasm of transverse colon: Secondary | ICD-10-CM

## 2014-02-03 DIAGNOSIS — Z86010 Personal history of colonic polyps: Secondary | ICD-10-CM

## 2014-02-03 LAB — GLUCOSE, CAPILLARY
GLUCOSE-CAPILLARY: 118 mg/dL — AB (ref 70–99)
Glucose-Capillary: 107 mg/dL — ABNORMAL HIGH (ref 70–99)

## 2014-02-03 MED ORDER — SODIUM CHLORIDE 0.9 % IV SOLN: 500.0000 mL | INTRAVENOUS | Status: DC

## 2014-02-03 NOTE — Op Note (Signed)
[place]  [address]. [place], [address]   COLONOSCOPY PROCEDURE REPORT  PATIENT: Mallory Hayes, Mallory Hayes  MR#: [id number] BIRTHDATE: 1945-10-30 , 71  yrs. old GENDER: female ENDOSCOPIST: Inda Castle, MD REFERRED BY: PROCEDURE DATE:  02/03/2014 PROCEDURE:   Colonoscopy with snare polypectomy and Colonoscopy with cold biopsy polypectomy First Screening Colonoscopy - Avg.  risk and is 50 yrs.  old or older - No.  Prior Negative Screening - Now for repeat screening. N/A  History of Adenoma - Now for follow-up colonoscopy & has been > or = to 3 yrs.  Yes hx of adenoma.  Has been 3 or more years since last colonoscopy.  Polyps Removed Today? Yes. ASA CLASS:   Class II INDICATIONS:high risk personal history of colonic polyps. 2010 MEDICATIONS: Monitored anesthesia care and Propofol 250 mg IV  DESCRIPTION OF PROCEDURE:   After the risks benefits and alternatives of the procedure were thoroughly explained, informed consent was obtained.  The digital rectal exam revealed no abnormalities of the rectum.   The LB [id number]  endoscope was introduced through the anus and advanced to the cecum, which was identified by both the appendix and ileocecal valve. No adverse events experienced.   The quality of the prep was Suprep good  The instrument was then slowly withdrawn as the colon was fully examined.      COLON FINDINGS: A sessile polyp measuring 4 mm in size was found in the transverse colon.  A polypectomy was performed with a cold snare.  The resection was complete, the polyp tissue was completely retrieved and sent to histology.   A sessile polyp measuring 2 mm in size was found in the descending colon.  A polypectomy was performed with cold forceps.   The examination was otherwise normal.   The examination was otherwise normal.  Retroflexed views revealed no abnormalities. The time to cecum=6 minutes 20 seconds. Withdrawal time=10 minutes 24 seconds.  The  scope was withdrawn and the procedure completed. COMPLICATIONS: There were no immediate complications.  ENDOSCOPIC IMPRESSION: 1.   Sessile polyp measuring 4 mm in size was found in the transverse colon; polypectomy was performed with a cold snare 2.   Sessile polyp measuring 2 mm in size was found in the descending colon; polypectomy was performed with cold forceps 3.   The examination was otherwise normal   RECOMMENDATIONS: If the polyp(s) removed today are proven to be adenomatous (pre-cancerous) polyps, you will need a repeat colonoscopy in 5 years.  Otherwise you should continue to follow colorectal cancer screening guidelines for "routine risk" patients with colonoscopy in 10 years.  You will receive a letter within 1-2 weeks with the results of your biopsy as well as final recommendations.  Please call my office if you have not received a letter after 3 weeks.  eSigned:  Inda Castle, MD 02/03/2014 10:46 AM   cc: Janne Napoleon, MD   PATIENT NAME:  Mallory Hayes, Mallory Hayes MR#: [id number]

## 2014-02-03 NOTE — Patient Instructions (Signed)

## 2014-02-03 NOTE — Progress Notes (Signed)
Called to room to assist during endoscopic procedure.  Patient ID and intended procedure confirmed with present staff. Received instructions for my participation in the procedure from the performing physician.  

## 2014-02-03 NOTE — Progress Notes (Signed)
No problems noted in the recovery room. maw 

## 2014-02-03 NOTE — Progress Notes (Signed)
A/ox3 pleased with MAC, report to Annette RN 

## 2014-02-04 ENCOUNTER — Telehealth: Payer: Self-pay | Admitting: *Deleted

## 2014-02-04 NOTE — Telephone Encounter (Signed)
  Follow up Call-  Call back number 02/03/2014  Post procedure Call Back phone  # [phone]  Permission to leave phone message Yes     Patient questions:  Do you have a fever, pain , or abdominal swelling? No. Pain Score  0 *  Have you tolerated food without any problems? Yes.    Have you been able to return to your normal activities? Yes.    Do you have any questions about your discharge instructions: Diet   No. Medications  No. Follow up visit  No.  Do you have questions or concerns about your Care? No.  Actions: * If pain score is 4 or above: No action needed, pain <4.

## 2014-02-08 ENCOUNTER — Encounter: Payer: Self-pay | Admitting: Gastroenterology

## 2014-03-03 ENCOUNTER — Telehealth: Payer: Self-pay | Admitting: Family Medicine

## 2014-03-03 DIAGNOSIS — K219 Gastro-esophageal reflux disease without esophagitis: Secondary | ICD-10-CM

## 2014-03-03 NOTE — Telephone Encounter (Signed)
Patient would like Nurse will provide the information about a Hydrographic surveyor. Please follow up with Patient.

## 2014-03-03 NOTE — Telephone Encounter (Signed)
Patient requests refill for 90 tablets, medication: Protonix 40 mg. Please send order through [place] ID: [id number].

## 2014-03-03 NOTE — Telephone Encounter (Signed)
Patient given the number to Mallory Hayes [phone])

## 2014-03-05 MED ORDER — PANTOPRAZOLE SODIUM 40 MG PO TBEC: 40.0000 mg | DELAYED_RELEASE_TABLET | Freq: Every day | ORAL | Status: DC

## 2014-03-05 NOTE — Telephone Encounter (Signed)
Rx sent in. Thanks! Shatira Dobosz J Jennessa Trigo, MD  

## 2014-03-10 ENCOUNTER — Ambulatory Visit (INDEPENDENT_AMBULATORY_CARE_PROVIDER_SITE_OTHER): Payer: Commercial Managed Care - HMO | Admitting: *Deleted

## 2014-03-10 ENCOUNTER — Ambulatory Visit: Payer: Commercial Managed Care - HMO | Admitting: *Deleted

## 2014-03-10 DIAGNOSIS — Z23 Encounter for immunization: Secondary | ICD-10-CM

## 2014-03-15 ENCOUNTER — Other Ambulatory Visit: Payer: Self-pay | Admitting: Family Medicine

## 2014-03-15 DIAGNOSIS — Z1231 Encounter for screening mammogram for malignant neoplasm of breast: Secondary | ICD-10-CM

## 2014-03-23 ENCOUNTER — Other Ambulatory Visit: Payer: Self-pay | Admitting: *Deleted

## 2014-03-23 DIAGNOSIS — K5909 Other constipation: Secondary | ICD-10-CM

## 2014-03-24 MED ORDER — LISINOPRIL-HYDROCHLOROTHIAZIDE 20-12.5 MG PO TABS: 2.0000 | ORAL_TABLET | Freq: Every day | ORAL | Status: DC

## 2014-03-24 MED ORDER — POLYETHYLENE GLYCOL 3350 17 GM/SCOOP PO POWD: 17.0000 g | Freq: Every day | ORAL | Status: DC | PRN

## 2014-04-05 ENCOUNTER — Other Ambulatory Visit: Payer: Self-pay | Admitting: *Deleted

## 2014-04-05 DIAGNOSIS — K5909 Other constipation: Secondary | ICD-10-CM

## 2014-04-05 NOTE — Telephone Encounter (Signed)
Pt is out of medication.  Derl Barrow, RN

## 2014-04-06 MED ORDER — LISINOPRIL-HYDROCHLOROTHIAZIDE 20-12.5 MG PO TABS: 2.0000 | ORAL_TABLET | Freq: Every day | ORAL | Status: DC

## 2014-04-06 MED ORDER — POLYETHYLENE GLYCOL 3350 17 GM/SCOOP PO POWD: 17.0000 g | Freq: Every day | ORAL | Status: DC | PRN

## 2014-04-19 ENCOUNTER — Ambulatory Visit (HOSPITAL_COMMUNITY): Payer: Commercial Managed Care - HMO

## 2014-04-27 ENCOUNTER — Encounter: Payer: Self-pay | Admitting: Family Medicine

## 2014-04-27 ENCOUNTER — Ambulatory Visit (INDEPENDENT_AMBULATORY_CARE_PROVIDER_SITE_OTHER): Payer: Commercial Managed Care - HMO | Admitting: Family Medicine

## 2014-04-27 VITALS — BP 160/67 | HR 56 | Temp 98.2°F | Ht 61.0 in | Wt 119.1 lb

## 2014-04-27 DIAGNOSIS — K5909 Other constipation: Secondary | ICD-10-CM

## 2014-04-27 DIAGNOSIS — Z13820 Encounter for screening for osteoporosis: Secondary | ICD-10-CM

## 2014-04-27 DIAGNOSIS — Z72820 Sleep deprivation: Secondary | ICD-10-CM

## 2014-04-27 DIAGNOSIS — E119 Type 2 diabetes mellitus without complications: Secondary | ICD-10-CM

## 2014-04-27 DIAGNOSIS — Z7689 Persons encountering health services in other specified circumstances: Secondary | ICD-10-CM

## 2014-04-27 DIAGNOSIS — I10 Essential (primary) hypertension: Secondary | ICD-10-CM

## 2014-04-27 LAB — BASIC METABOLIC PANEL
BUN: 27 mg/dL — AB (ref 6–23)
CO2: 30 meq/L (ref 19–32)
Calcium: 9.8 mg/dL (ref 8.4–10.5)
Chloride: 95 mEq/L — ABNORMAL LOW (ref 96–112)
Creat: 1.09 mg/dL (ref 0.50–1.10)
Glucose, Bld: 87 mg/dL (ref 70–99)
Potassium: 3.6 mEq/L (ref 3.5–5.3)
Sodium: 135 mEq/L (ref 135–145)

## 2014-04-27 LAB — POCT GLYCOSYLATED HEMOGLOBIN (HGB A1C): HEMOGLOBIN A1C: 7.1

## 2014-04-27 MED ORDER — AMLODIPINE BESYLATE 5 MG PO TABS: 5.0000 mg | ORAL_TABLET | Freq: Every day | ORAL | Status: DC

## 2014-04-27 MED ORDER — GLUCOSE BLOOD VI STRP: ORAL_STRIP | Status: DC

## 2014-04-27 MED ORDER — ACCU-CHEK FASTCLIX LANCETS MISC: Status: DC

## 2014-04-27 NOTE — Progress Notes (Signed)
Patient ID: Mallory Hayes, female   DOB: 07-09-1945, 68 y.o.   MRN: [id number]  HPI:  Difficulty sleeping: Never had sleep study. Wakes up in middle of night frequently. No bed partners. Does not feel sleepy during the day. Not sure if she snores, she thinks she might.   Constipation: occ tightness on R side of abdomen. Taking miralax every day but occasionally skips. Gets BM every 3 days.  HTN: currently taking lisin-HCTZ 20-12.5 2 pills daily. No problems with CP or SOB. Wants info on DASH diet.   Diabetes: due for A1c today. Taking metformin XR 1000 mg daily. Up to date on eye exam & foot exam. Needs rx for test strips & lancets. Has accuchek nano meter.  ROS: See HPI.  [place]: hx constipation, T2DM, HLD, HTN, reflux  PHYSICAL EXAM: BP 160/67 mmHg  Pulse 56  Temp(Src) 98.2 F (36.8 C) (Oral)  Ht 5\' 1"  (1.549 m)  Wt 119 lb 1.6 oz (54.023 kg)  BMI 22.52 kg/m2 Gen: NAD HEENT: NCAT Heart: RRR no murmurs Lungs: CTAB NWOB Neuro: grossly nonfocal speech normal Ext: No appreciable lower extremity edema bilaterally   ASSESSMENT/PLAN:  Health maintenance:  -schedule for DEXA scan, has never had one  Sleep concern With BP still high despite maximal ACE therapy & HCTZ in setting of pt who endorses frequent awakenings, will go ahead and order sleep study.  Type 2 diabetes mellitus A1c 7.1. Continue metformin at present dose.  Cardiac: on statin, aspirin Renal: on ACE. Check renal fxn today Eye: up to date Foot: up to date   CONSTIPATION, CHRONIC Well controlled. Continue current regimen.   HYPERTENSION, BENIGN SYSTEMIC BP still above goal (140/90) in pt with diabetes. On max dose lisinopril and HCTZ. Will add amlodipine 5mg  daily. F/u in 2 week for RN BP check.   FOLLOW UP: F/u in 2 weeks for RN BP check Then likely f/u with me in 3 mos.  [place]. Ardelia Mems, [place]

## 2014-04-27 NOTE — Patient Instructions (Addendum)
It was great to see you again today!  Checking bloodwork today. I will call you if your test results are not normal.  Otherwise, I will send you a letter.  If you do not hear from me with in 2 weeks please call our office.      Setting you up for DEXA scan (osteoporosis screening) and sleep study.  Start taking amlodipine 5mg  daily. Return in 2 weeks for a nurse blood pressure check to see if your BP is better with this medication.  Follow up with me in 3 months or sooner if you have problems.  Be well, Dr. Ardelia Mems      DASH Eating Plan DASH stands for "Dietary Approaches to Stop Hypertension." The DASH eating plan is a healthy eating plan that has been shown to reduce high blood pressure (hypertension). Additional health benefits may include reducing the risk of type 2 diabetes mellitus, heart disease, and stroke. The DASH eating plan may also help with weight loss. WHAT DO I NEED TO KNOW ABOUT THE DASH EATING PLAN? For the DASH eating plan, you will follow these general guidelines:  Choose foods with a percent daily value for sodium of less than 5% (as listed on the food label).  Use salt-free seasonings or herbs instead of table salt or sea salt.  Check with your health care provider or pharmacist before using salt substitutes.  Eat lower-sodium products, often labeled as "lower sodium" or "no salt added."  Eat fresh foods.  Eat more vegetables, fruits, and low-fat dairy products.  Choose whole grains. Look for the word "whole" as the first word in the ingredient list.  Choose fish and skinless chicken or [place] more often than red meat. Limit fish, poultry, and meat to 6 oz (170 g) each day.  Limit sweets, desserts, sugars, and sugary drinks.  Choose heart-healthy fats.  Limit cheese to 1 oz (28 g) per day.  Eat more home-cooked food and less restaurant, buffet, and fast food.  Limit fried foods.  Cook foods using methods other than frying.  Limit canned  vegetables. If you do use them, rinse them well to decrease the sodium.  When eating at a restaurant, ask that your food be prepared with less salt, or no salt if possible. WHAT FOODS CAN I EAT? Seek help from a dietitian for individual calorie needs. Grains Whole grain or whole wheat bread. Brown rice. Whole grain or whole wheat pasta. Quinoa, bulgur, and whole grain cereals. Low-sodium cereals. Corn or whole wheat flour tortillas. Whole grain cornbread. Whole grain crackers. Low-sodium crackers. Vegetables Fresh or frozen vegetables (raw, steamed, roasted, or grilled). Low-sodium or reduced-sodium tomato and vegetable juices. Low-sodium or reduced-sodium tomato sauce and paste. Low-sodium or reduced-sodium canned vegetables.  Fruits All fresh, canned (in natural juice), or frozen fruits. Meat and Other Protein Products Ground beef (85% or leaner), grass-fed beef, or beef trimmed of fat. Skinless chicken or [place]. Ground chicken or [place]. Pork trimmed of fat. All fish and seafood. Eggs. Dried beans, peas, or lentils. Unsalted nuts and seeds. Unsalted canned beans. Dairy Low-fat dairy products, such as skim or 1% milk, 2% or reduced-fat cheeses, low-fat ricotta or cottage cheese, or plain low-fat yogurt. Low-sodium or reduced-sodium cheeses. Fats and Oils Tub margarines without trans fats. Light or reduced-fat mayonnaise and salad dressings (reduced sodium). Avocado. Safflower, olive, or canola oils. Natural peanut or almond butter. Other Unsalted popcorn and pretzels. The items listed above may not be a complete list of recommended foods or  beverages. Contact your dietitian for more options. WHAT FOODS ARE NOT RECOMMENDED? Grains White bread. White pasta. White rice. Refined cornbread. Bagels and croissants. Crackers that contain trans fat. Vegetables Creamed or fried vegetables. Vegetables in a cheese sauce. Regular canned vegetables. Regular canned tomato sauce and paste. Regular tomato  and vegetable juices. Fruits Dried fruits. Canned fruit in light or heavy syrup. Fruit juice. Meat and Other Protein Products Fatty cuts of meat. Ribs, chicken wings, bacon, sausage, bologna, salami, chitterlings, fatback, hot dogs, bratwurst, and packaged luncheon meats. Salted nuts and seeds. Canned beans with salt. Dairy Whole or 2% milk, cream, half-and-half, and cream cheese. Whole-fat or sweetened yogurt. Full-fat cheeses or blue cheese. Nondairy creamers and whipped toppings. Processed cheese, cheese spreads, or cheese curds. Condiments Onion and garlic salt, seasoned salt, table salt, and sea salt. Canned and packaged gravies. Worcestershire sauce. Tartar sauce. Barbecue sauce. Teriyaki sauce. Soy sauce, including reduced sodium. Steak sauce. Fish sauce. Oyster sauce. Cocktail sauce. Horseradish. Ketchup and mustard. Meat flavorings and tenderizers. Bouillon cubes. Hot sauce. Tabasco sauce. Marinades. Taco seasonings. Relishes. Fats and Oils Butter, stick margarine, lard, shortening, ghee, and bacon fat. Coconut, palm kernel, or palm oils. Regular salad dressings. Other Pickles and olives. Salted popcorn and pretzels. The items listed above may not be a complete list of foods and beverages to avoid. Contact your dietitian for more information. WHERE CAN I FIND MORE INFORMATION? National Heart, Lung, and Blood Institute: travelstabloid.com Document Released: 04/05/2011 Document Revised: 08/31/2013 Document Reviewed: 02/18/2013 [facility] Patient Information 2015 [place], [place]. This information is not intended to replace advice given to you by your health care provider. Make sure you discuss any questions you have with your health care provider.

## 2014-04-28 ENCOUNTER — Encounter: Payer: Self-pay | Admitting: Family Medicine

## 2014-04-29 ENCOUNTER — Ambulatory Visit (HOSPITAL_COMMUNITY)
Admission: RE | Admit: 2014-04-29 | Discharge: 2014-04-29 | Disposition: A | Discharge status: Home / Self Care | Payer: Commercial Managed Care - HMO | Source: Ambulatory Visit | Attending: Family Medicine | Admitting: Family Medicine

## 2014-04-29 DIAGNOSIS — Z1231 Encounter for screening mammogram for malignant neoplasm of breast: Secondary | ICD-10-CM | POA: Insufficient documentation

## 2014-05-04 ENCOUNTER — Other Ambulatory Visit: Payer: Self-pay | Admitting: Family Medicine

## 2014-05-04 ENCOUNTER — Telehealth: Payer: Self-pay | Admitting: Family Medicine

## 2014-05-04 DIAGNOSIS — E119 Type 2 diabetes mellitus without complications: Secondary | ICD-10-CM

## 2014-05-04 DIAGNOSIS — M8580 Other specified disorders of bone density and structure, unspecified site: Secondary | ICD-10-CM

## 2014-05-04 NOTE — Telephone Encounter (Signed)
Pt called and would like her lancets and strips called in to her mail order pharmacy where they will not cost anything for her.jw

## 2014-05-04 NOTE — Assessment & Plan Note (Signed)
Well-controlled.  Continue current regimen. 

## 2014-05-04 NOTE — Assessment & Plan Note (Signed)
A1c 7.1. Continue metformin at present dose.  Cardiac: on statin, aspirin Renal: on ACE. Check renal fxn today Eye: up to date Foot: up to date

## 2014-05-04 NOTE — Assessment & Plan Note (Signed)
BP still above goal (140/90) in pt with diabetes. On max dose lisinopril and HCTZ. Will add amlodipine 5mg  daily. F/u in 2 week for RN BP check.

## 2014-05-04 NOTE — Telephone Encounter (Signed)
I sent these in during her visit last week. If they were not the correct kind, can [facility] red team please call pt and ask her to have her pharmacy send a request for the correct type that her insurance will cover?  Thanks [name], MD

## 2014-05-04 NOTE — Assessment & Plan Note (Signed)
With BP still high despite maximal ACE therapy & HCTZ in setting of pt who endorses frequent awakenings, will go ahead and order sleep study.

## 2014-05-05 MED ORDER — GLUCOSE BLOOD VI STRP: ORAL_STRIP | Status: DC

## 2014-05-05 MED ORDER — ACCU-CHEK FASTCLIX LANCETS MISC: Status: DC

## 2014-05-05 NOTE — Telephone Encounter (Signed)
They were correct patient just wants them sent to her mail order pharmacy [facility] of [facility], pharmacy changed in epic.

## 2014-05-05 NOTE — Telephone Encounter (Signed)
Rx sent in. Thanks! Avyay Coger J Debra Calabretta, MD  

## 2014-05-06 ENCOUNTER — Other Ambulatory Visit: Payer: Self-pay | Admitting: Family Medicine

## 2014-05-10 NOTE — Telephone Encounter (Signed)
Procedure done.

## 2014-06-03 ENCOUNTER — Ambulatory Visit: Payer: Medicare HMO | Admitting: Podiatrist

## 2014-06-07 ENCOUNTER — Other Ambulatory Visit: Payer: Self-pay | Admitting: Family Medicine

## 2014-06-07 DIAGNOSIS — E7800 Pure hypercholesterolemia, unspecified: Secondary | ICD-10-CM

## 2014-06-07 MED ORDER — ROSUVASTATIN CALCIUM 20 MG PO TABS: 20.0000 mg | ORAL_TABLET | Freq: Every day | ORAL | Status: DC

## 2014-06-07 NOTE — Telephone Encounter (Signed)
Needs refill on Crestor [facility]

## 2014-06-10 ENCOUNTER — Telehealth: Payer: Self-pay | Admitting: *Deleted

## 2014-06-10 ENCOUNTER — Ambulatory Visit (INDEPENDENT_AMBULATORY_CARE_PROVIDER_SITE_OTHER): Payer: Commercial Managed Care - HMO | Admitting: *Deleted

## 2014-06-10 VITALS — BP 136/70 | HR 60

## 2014-06-10 DIAGNOSIS — Z0130 Encounter for examination of blood pressure without abnormal findings: Secondary | ICD-10-CM

## 2014-06-10 DIAGNOSIS — Z136 Encounter for screening for cardiovascular disorders: Secondary | ICD-10-CM

## 2014-06-10 NOTE — Progress Notes (Signed)
   Pt in nurse clinic for blood pressure check.  BP 136/71 manually, heart rate 60.  Pt denies any symptoms today.  Pt stated she is taking her blood pressure medication as Prescribed.  Pt wanted to know if she should continue to take her cholesterol medication.  Will forward to PCP.  Derl Barrow, RN

## 2014-06-10 NOTE — Telephone Encounter (Signed)
-----   Message from [name], MD sent at 06/10/2014  4:49 PM EST -----   ----- Message -----    From: Derl Barrow, RN    Sent: 06/10/2014   2:17 PM      To: [name], MD

## 2014-06-10 NOTE — Telephone Encounter (Signed)
Left voice message informing pt to continue cholesterol medication and blood pressure medication per PCP.  Derl Barrow, RN

## 2014-06-10 NOTE — Progress Notes (Signed)
Yes, pt should remain on her rosuvastatin. Also continue blood pressure meds since she is now at goal. Please inform patient.  [name], MD

## 2014-06-15 ENCOUNTER — Telehealth: Payer: Self-pay | Admitting: Family Medicine

## 2014-06-15 NOTE — Telephone Encounter (Signed)
[facility].  Rx stated print; Crestor called into pharmacy per Dr. Ardelia Mems: Crestor 20 mg Take 1 tablet (20 mg total) by mouth at bedtime #90, 3 refills.  Pt informed to medication called in.  Derl Barrow, RN

## 2014-06-15 NOTE — Telephone Encounter (Signed)
Pt checking status of rx for crestor, needs today, thought it was supposed to be called in last week. [facility]

## 2014-07-02 ENCOUNTER — Encounter: Payer: Self-pay | Admitting: Family Medicine

## 2014-07-02 NOTE — Progress Notes (Signed)
Pt came in and explained that she can not afford her Rx Crestor at $141 and if she could get an authorization letter faxed to the mail order program for permission to get her Rx through the mail then she could save a lot of time and money. The fax number is [phone] and the pt would like a phone call once it is complete at [phone]. / thanks sr

## 2014-07-05 ENCOUNTER — Other Ambulatory Visit: Payer: Self-pay | Admitting: Family Medicine

## 2014-07-05 DIAGNOSIS — E7800 Pure hypercholesterolemia, unspecified: Secondary | ICD-10-CM

## 2014-07-05 MED ORDER — ROSUVASTATIN CALCIUM 20 MG PO TABS: 20.0000 mg | ORAL_TABLET | Freq: Every day | ORAL | Status: DC

## 2014-07-05 NOTE — Progress Notes (Signed)
rx sent in to [place], MD

## 2014-07-05 NOTE — Progress Notes (Signed)
Patient has private insurance [employer]), will not qualify for MAP. Patient states all she needs is a new rx for Crestor faxed to number provided so that she can get rx cheaper. Will forward to PCP.

## 2014-07-05 NOTE — Progress Notes (Signed)
Please inform patient that she will need to go through the Medication Assistance Program at the health department for this. We do not complete these forms at our office.  [name], MD

## 2014-07-08 ENCOUNTER — Telehealth: Payer: Self-pay | Admitting: Family Medicine

## 2014-07-08 NOTE — Telephone Encounter (Signed)
Left voice message for pt to return nurse call.  Derl Barrow, RN

## 2014-07-08 NOTE — Telephone Encounter (Signed)
Please call Mallory Hayes back regarding discussion previously about her med

## 2014-07-09 NOTE — Telephone Encounter (Signed)
Left voice message for return nurse call regarding previous message from pt. Derl Barrow, RN

## 2014-07-14 ENCOUNTER — Ambulatory Visit (HOSPITAL_BASED_OUTPATIENT_CLINIC_OR_DEPARTMENT_OTHER): Payer: Medicare HMO | Attending: Family Medicine | Admitting: *Deleted

## 2014-07-14 VITALS — Ht 61.0 in | Wt 119.5 lb

## 2014-07-14 DIAGNOSIS — I10 Essential (primary) hypertension: Secondary | ICD-10-CM | POA: Insufficient documentation

## 2014-07-19 ENCOUNTER — Encounter: Payer: Self-pay | Admitting: Family Medicine

## 2014-07-19 ENCOUNTER — Other Ambulatory Visit: Payer: Medicare HMO

## 2014-07-19 DIAGNOSIS — E119 Type 2 diabetes mellitus without complications: Secondary | ICD-10-CM

## 2014-07-19 NOTE — Progress Notes (Signed)
[facility] phlebotomist drew:  BMP 

## 2014-07-19 NOTE — Progress Notes (Signed)
Pt would like to speak to someone regarding what she should do about her eyes. Wants to know if there are any type of testing that we offer here / please contact the pt with info @ [phone] / thanks Fonda Kinder, ASA

## 2014-07-20 ENCOUNTER — Other Ambulatory Visit: Payer: Self-pay | Admitting: Family Medicine

## 2014-07-20 DIAGNOSIS — E119 Type 2 diabetes mellitus without complications: Secondary | ICD-10-CM

## 2014-07-20 LAB — BASIC METABOLIC PANEL
BUN: 23 mg/dL (ref 6–23)
CALCIUM: 9.8 mg/dL (ref 8.4–10.5)
CO2: 27 mEq/L (ref 19–32)
CREATININE: 1.42 mg/dL — AB (ref 0.50–1.10)
Chloride: 94 mEq/L — ABNORMAL LOW (ref 96–112)
Glucose, Bld: 151 mg/dL — ABNORMAL HIGH (ref 70–99)
Potassium: 3.2 mEq/L — ABNORMAL LOW (ref 3.5–5.3)
SODIUM: 133 meq/L — AB (ref 135–145)

## 2014-07-20 NOTE — Progress Notes (Signed)
Referral entered Brittany J McIntyre, MD  

## 2014-07-20 NOTE — Progress Notes (Signed)
Spoke with patient, she need referral to opthalmologist for diabetic eye exam. Has [employer] and will need referral to come from PCP. Will forward to MD.

## 2014-07-21 ENCOUNTER — Telehealth: Payer: Self-pay | Admitting: Family Medicine

## 2014-07-21 DIAGNOSIS — N179 Acute kidney failure, unspecified: Secondary | ICD-10-CM

## 2014-07-21 NOTE — Telephone Encounter (Signed)
Attempted to reach pt regarding reduced renal function on BMET. She also had mildly low sodium, potassium, and chloride. Should return for a repeat lab draw in the next day or so. No answer, but left VM asking pt to return call.  If patient returns call please ask her to schedule a lab visit today or tomorrow. I am happy to talk with her on the phone if she has questions for me.  Thanks, [name], MD

## 2014-07-22 NOTE — Telephone Encounter (Signed)
Red team, patient never returned this call. Please call her and ask her to come in for lab visit at her earliest convenience  [name], MD

## 2014-07-26 NOTE — Telephone Encounter (Signed)
Patient informed, lab visit scheduled for 3/30 at 145pm.

## 2014-07-28 ENCOUNTER — Other Ambulatory Visit: Payer: Medicare HMO

## 2014-07-28 DIAGNOSIS — N179 Acute kidney failure, unspecified: Secondary | ICD-10-CM

## 2014-07-28 LAB — BASIC METABOLIC PANEL
BUN: 21 mg/dL (ref 6–23)
CALCIUM: 9.9 mg/dL (ref 8.4–10.5)
CO2: 30 mEq/L (ref 19–32)
CREATININE: 1.04 mg/dL (ref 0.50–1.10)
Chloride: 96 mEq/L (ref 96–112)
Glucose, Bld: 120 mg/dL — ABNORMAL HIGH (ref 70–99)
Potassium: 4.5 mEq/L (ref 3.5–5.3)
Sodium: 137 mEq/L (ref 135–145)

## 2014-07-28 NOTE — Progress Notes (Signed)
[facility] phlebotomist drew:  BMP 

## 2014-07-29 ENCOUNTER — Encounter: Payer: Self-pay | Admitting: Family Medicine

## 2014-07-31 DIAGNOSIS — G4733 Obstructive sleep apnea (adult) (pediatric): Secondary | ICD-10-CM | POA: Diagnosis not present

## 2014-07-31 DIAGNOSIS — I10 Essential (primary) hypertension: Secondary | ICD-10-CM | POA: Diagnosis not present

## 2014-07-31 NOTE — Sleep Study (Signed)
   NAME: Mallory Hayes DATE OF BIRTH:  [birth year] MEDICAL RECORD NUMBER [id number]  LOCATION: [facility]  PHYSICIAN: YOUNG,CLINTON D  DATE OF STUDY: 07/14/2014  SLEEP STUDY TYPE: Nocturnal Polysomnogram               REFERRING PHYSICIAN: Willeen Niece, MD  INDICATION FOR STUDY: Hypersomnia with sleep apnea  EPWORTH SLEEPINESS SCORE:   9/24 HEIGHT: 5\' 1"  (154.9 cm)  WEIGHT: 54.205 kg (119 lb 8 oz)    Body mass index is 22.59 kg/(m^2).  NECK SIZE: 14 in.  MEDICATIONS: Charted for review  SLEEP ARCHITECTURE: Total sleep time 271.5 minutes with sleep efficiency 70.3%. Stage I was 10.5%, stage II 68.1%, stage III absent, REM 21.4% of total sleep time. Sleep latency 1 minute, REM latency 48.5 minutes, awake after sleep onset 113.5 minutes, arousal index 14.1, bedtime medication: Norvasc  RESPIRATORY DATA: Apnea hypopnea index (AHI) 6.2 per hour. 28 total events scored including 7 obstructive apneas and 21 hypopneas. All events were while supine. REM AHI 14.5 per hour. There were not enough events to qualify for split protocol CPAP titration  OXYGEN DATA: Mild snoring with oxygen desaturation to a nadir of 84% and mean saturation 95.2% on room air.  CARDIAC DATA: Sinus bradycardia with mean heart rate 44/beats per minute, with occasional PVC. A single episode of supraventricular tachycardia 7 beats was recorded.  MOVEMENT/PARASOMNIA: No significant movement disturbance, bathroom 2  IMPRESSION/ RECOMMENDATION:   1) Mild obstructive sleep apnea/hypopnea syndrome, AHI 6.2 per hour. Mild snoring with oxygen desaturation to a nadir of 84% and mean saturation 95.2% on room air. There were not enough events to permit CPAP titration. 2) Sinus bradycardia with mean heart rate 44 bpm.    Deneise Lever Diplomate, American Board of Sleep Medicine  ELECTRONICALLY SIGNED ON:  07/31/2014, 4:03 PM [place] PH: [phone]   FX: [phone]  [phone] [place]

## 2014-08-02 ENCOUNTER — Telehealth: Payer: Self-pay | Admitting: Family Medicine

## 2014-08-02 DIAGNOSIS — G4733 Obstructive sleep apnea (adult) (pediatric): Secondary | ICD-10-CM

## 2014-08-02 NOTE — Telephone Encounter (Signed)
Call patient to inform her of results of sleep study, which showed mild OSA. Patient reports she's already been given a CPAP machine. It has not been titrated. I will order CPAP titration study. Someone should be in touch with her to schedule this.  [name], MD

## 2014-08-19 ENCOUNTER — Ambulatory Visit (INDEPENDENT_AMBULATORY_CARE_PROVIDER_SITE_OTHER): Payer: Medicare HMO

## 2014-08-19 DIAGNOSIS — M79676 Pain in unspecified toe(s): Secondary | ICD-10-CM

## 2014-08-19 DIAGNOSIS — M79673 Pain in unspecified foot: Secondary | ICD-10-CM | POA: Diagnosis not present

## 2014-08-19 DIAGNOSIS — B351 Tinea unguium: Secondary | ICD-10-CM

## 2014-08-19 NOTE — Progress Notes (Signed)
Presents today chief complaint of painful elongated toenails.  Objective: Pulses are palpable bilateral nails are thick, yellow dystrophic onychomycosis and painful palpation.   Assessment: Onychomycosis with pain in limb.  Plan: Treatment of nails in thickness and length as covered service secondary to pain.  

## 2014-08-23 LAB — HM DIABETES EYE EXAM

## 2014-08-31 ENCOUNTER — Other Ambulatory Visit: Payer: Self-pay | Admitting: Family Medicine

## 2014-09-08 ENCOUNTER — Other Ambulatory Visit: Payer: Self-pay | Admitting: Family Medicine

## 2014-09-08 DIAGNOSIS — E7800 Pure hypercholesterolemia, unspecified: Secondary | ICD-10-CM

## 2014-09-08 MED ORDER — ROSUVASTATIN CALCIUM 20 MG PO TABS
20.0000 mg | ORAL_TABLET | Freq: Every day | ORAL | Status: DC
Start: 2014-09-08 — End: 2015-10-03

## 2014-09-15 ENCOUNTER — Inpatient Hospital Stay: Admission: RE | Admit: 2014-09-15 | Payer: Medicare HMO | Source: Ambulatory Visit

## 2014-09-21 ENCOUNTER — Other Ambulatory Visit: Payer: Self-pay | Admitting: *Deleted

## 2014-09-21 DIAGNOSIS — N3946 Mixed incontinence: Secondary | ICD-10-CM

## 2014-09-21 NOTE — Telephone Encounter (Signed)
Pt has re-enrolled with MAP.  Pt's free 90 day supply has arrived and they need a new Rx on file.  Derl Barrow, RN

## 2014-09-29 ENCOUNTER — Ambulatory Visit
Admission: RE | Admit: 2014-09-29 | Discharge: 2014-09-29 | Disposition: A | Discharge status: Home / Self Care | Payer: Medicare HMO | Source: Ambulatory Visit | Attending: Family Medicine | Admitting: Family Medicine

## 2014-09-29 DIAGNOSIS — M8580 Other specified disorders of bone density and structure, unspecified site: Secondary | ICD-10-CM

## 2014-10-01 ENCOUNTER — Other Ambulatory Visit: Payer: Self-pay | Admitting: Family Medicine

## 2014-10-01 ENCOUNTER — Encounter: Payer: Self-pay | Admitting: Family Medicine

## 2014-10-01 MED ORDER — CALCIUM CITRATE-VITAMIN D 500-400 MG-UNIT PO CHEW: 2.0000 | CHEWABLE_TABLET | Freq: Every day | ORAL | Status: DC

## 2014-10-29 ENCOUNTER — Other Ambulatory Visit: Payer: Self-pay | Admitting: Family Medicine

## 2014-11-15 ENCOUNTER — Other Ambulatory Visit: Payer: Self-pay | Admitting: *Deleted

## 2014-11-15 MED ORDER — MOMETASONE FUROATE 50 MCG/ACT NA SUSP: 2.0000 | Freq: Every day | NASAL | Status: DC

## 2014-11-15 NOTE — Telephone Encounter (Signed)
MAP has received patient's 90 day supply of Nasonex. They requesting a new Rx.  Derl Barrow, RN

## 2014-11-17 ENCOUNTER — Ambulatory Visit (HOSPITAL_BASED_OUTPATIENT_CLINIC_OR_DEPARTMENT_OTHER): Payer: Medicare HMO | Attending: Family Medicine | Admitting: Radiology

## 2014-11-17 DIAGNOSIS — G4733 Obstructive sleep apnea (adult) (pediatric): Secondary | ICD-10-CM | POA: Diagnosis not present

## 2014-11-17 DIAGNOSIS — G4730 Sleep apnea, unspecified: Secondary | ICD-10-CM | POA: Diagnosis present

## 2014-11-18 ENCOUNTER — Ambulatory Visit (INDEPENDENT_AMBULATORY_CARE_PROVIDER_SITE_OTHER): Payer: Medicare HMO | Admitting: Podiatry

## 2014-11-18 ENCOUNTER — Encounter: Payer: Self-pay | Admitting: Podiatry

## 2014-11-18 DIAGNOSIS — M79673 Pain in unspecified foot: Secondary | ICD-10-CM

## 2014-11-18 DIAGNOSIS — B351 Tinea unguium: Secondary | ICD-10-CM | POA: Diagnosis not present

## 2014-11-18 DIAGNOSIS — M79676 Pain in unspecified toe(s): Secondary | ICD-10-CM

## 2014-11-18 NOTE — Progress Notes (Signed)
Patient ID: Mallory Hayes, female   DOB: 02/08/1946, 69 y.o.   MRN: [id number] Complaint:  Visit Type: Patient returns to my office for continued preventative foot care services. Complaint: Patient states" my nails have grown long and thick and become painful to walk and wear shoes" Patient has been diagnosed with DM with no foot complications. The patient presents for preventative foot care services. No changes to ROS  Podiatric Exam: Vascular: dorsalis pedis and posterior tibial pulses are palpable bilateral. Capillary return is immediate. Temperature gradient is WNL. Skin turgor WNL  Sensorium: Normal Semmes Weinstein monofilament test. Normal tactile sensation bilaterally. Nail Exam: Pt has thick disfigured discolored nails with subungual debris noted bilateral entire nail hallux through fifth toenails Ulcer Exam: There is no evidence of ulcer or pre-ulcerative changes or infection. Orthopedic Exam: Muscle tone and strength are WNL. No limitations in general ROM. No crepitus or effusions noted. Foot type and digits show no abnormalities. Bony prominences are unremarkable. Skin: No Porokeratosis. No infection or ulcers  Diagnosis:  Onychomycosis, , Pain in right toe, pain in left toes  Treatment & Plan Procedures and Treatment: Consent by patient was obtained for treatment procedures. The patient understood the discussion of treatment and procedures well. All questions were answered thoroughly reviewed. Debridement of mycotic and hypertrophic toenails, 1 through 5 bilateral and clearing of subungual debris. No ulceration, no infection noted.  Return Visit-Office Procedure: Patient instructed to return to the office for a follow up visit 3 months for continued evaluation and treatment.

## 2014-11-20 DIAGNOSIS — G4733 Obstructive sleep apnea (adult) (pediatric): Secondary | ICD-10-CM | POA: Diagnosis not present

## 2014-11-20 NOTE — Progress Notes (Signed)
   Patient Name: Mallory Hayes, Mallory Hayes Date: 11/17/2014 Gender: Female D.O.B: 08-21-1945 Age (years): 60 Referring Provider: Madison Hickman, MD Height (inches): 61 Interpreting Physician: Baird Lyons MD, ABSM Weight (lbs): 115 RPSGT: Laren Everts BMI: 22 MRN: [id number] Neck Size: 13.50 CLINICAL INFORMATION The patient is referred for a CPAP titration to treat sleep apnea.  Date of NPSG, Split Night or HST: 07/14/14 AHI 6.2/ hr, weight was 119 lbs  SLEEP STUDY TECHNIQUE As per the AASM Manual for the Scoring of Sleep and Associated Events v2.3 (April 2016) with a hypopnea requiring 4% desaturations.  The channels recorded and monitored were frontal, central and occipital EEG, electrooculogram (EOG), submentalis EMG (chin), nasal and oral airflow, thoracic and abdominal wall motion, anterior tibialis EMG, snore microphone, electrocardiogram, and pulse oximetry. Continuous positive airway pressure (CPAP) was initiated at the beginning of the study and titrated to treat sleep-disordered breathing.  MEDICATIONS Medications taken by the patient : Charted for review Medications administered by patient during sleep study :Crestor TECHNICIAN COMMENTS Comments added by technician: NONE  Comments added by scorer: N/A  RESPIRATORY PARAMETERS Optimal PAP Pressure (cm): 6 AHI at Optimal Pressure (/hr): 0.3 Overall Minimal O2 (%): 95.00 Supine % at Optimal Pressure (%): 21 Minimal O2 at Optimal Pressure (%): 97.0    SLEEP ARCHITECTURE The study was initiated at 11:18:00 PM and ended at 5:15:15 AM.  Sleep onset time was 4.2 minutes and the sleep efficiency was 76.3%. The total sleep time was 272.5 minutes.  The patient spent 11.74% of the night in stage N1 sleep, 61.83% in stage N2 sleep, 0.00% in stage N3 and 26.42% in REM.Stage REM latency was 43.0 minutes  Wake after sleep onset was 80.5. Alpha intrusion was absent. Supine sleep was 38.90%.  CARDIAC DATA The 2 lead EKG  demonstrated sinus rhythm. The mean heart rate was 39.71 beats per minute. Other EKG findings include: None.  LEG MOVEMENT DATA The total Periodic Limb Movements of Sleep (PLMS) were 12. The PLMS index was 2.64. A PLMS index of <15 is considered normal in adults.  IMPRESSIONS Successfull CPAP titration to 6 cm of water Significant oxygen desaturations were not observed during this titration (min O2 = 95.00%). No snoring was audible during this study. Low mean heart rate was reported 39.7/ min- verify clinically Clinically significant periodic limb movements were not noted during this study. Arousals associated with PLMs were rare.  DIAGNOSIS Obstructive Sleep Apnea (327.23 [G47.33 ICD-10])  RECOMMENDATIONS Trial of CPAP therapy on 6 cm H2O with a Small size Fisher&Paykel Full Face Mask Simplus mask and heated humidification. Avoid alcohol, sedatives and other CNS depressants that may worsen sleep apnea and disrupt normal sleep architecture. Sleep hygiene should be reviewed to assess factors that may improve sleep quality. Weight management and regular exercise should be initiated or continued.   Deneise Lever Diplomate, American Board of Sleep Medicine  ELECTRONICALLY SIGNED ON:  11/20/2014, 1:41 PM [place] PH: [phone]   FX: [phone] [place]

## 2014-11-22 ENCOUNTER — Encounter: Payer: Self-pay | Admitting: Family Medicine

## 2014-12-02 ENCOUNTER — Ambulatory Visit (INDEPENDENT_AMBULATORY_CARE_PROVIDER_SITE_OTHER): Payer: Medicare HMO | Admitting: Family Medicine

## 2014-12-02 ENCOUNTER — Other Ambulatory Visit (HOSPITAL_COMMUNITY)
Admission: RE | Admit: 2014-12-02 | Discharge: 2014-12-02 | Disposition: A | Discharge status: Home / Self Care | Payer: Medicare HMO | Source: Ambulatory Visit | Attending: Family Medicine | Admitting: Family Medicine

## 2014-12-02 ENCOUNTER — Encounter: Payer: Self-pay | Admitting: Family Medicine

## 2014-12-02 VITALS — BP 128/53 | HR 48 | Temp 98.2°F | Ht 61.0 in | Wt 111.0 lb

## 2014-12-02 DIAGNOSIS — Z113 Encounter for screening for infections with a predominantly sexual mode of transmission: Secondary | ICD-10-CM | POA: Insufficient documentation

## 2014-12-02 DIAGNOSIS — N760 Acute vaginitis: Secondary | ICD-10-CM | POA: Insufficient documentation

## 2014-12-02 DIAGNOSIS — E119 Type 2 diabetes mellitus without complications: Secondary | ICD-10-CM

## 2014-12-02 DIAGNOSIS — I10 Essential (primary) hypertension: Secondary | ICD-10-CM

## 2014-12-02 DIAGNOSIS — Z23 Encounter for immunization: Secondary | ICD-10-CM | POA: Diagnosis not present

## 2014-12-02 LAB — COMPREHENSIVE METABOLIC PANEL
ALT: 17 U/L (ref 6–29)
AST: 22 U/L (ref 10–35)
Albumin: 4.4 g/dL (ref 3.6–5.1)
Alkaline Phosphatase: 51 U/L (ref 33–130)
BUN: 21 mg/dL (ref 7–25)
CALCIUM: 10 mg/dL (ref 8.6–10.4)
CO2: 29 mmol/L (ref 20–31)
CREATININE: 1.15 mg/dL — AB (ref 0.50–0.99)
Chloride: 96 mmol/L — ABNORMAL LOW (ref 98–110)
GLUCOSE: 124 mg/dL — AB (ref 65–99)
POTASSIUM: 3.5 mmol/L (ref 3.5–5.3)
Sodium: 137 mmol/L (ref 135–146)
Total Bilirubin: 0.6 mg/dL (ref 0.2–1.2)
Total Protein: 7.5 g/dL (ref 6.1–8.1)

## 2014-12-02 LAB — LIPID PANEL
CHOLESTEROL: 150 mg/dL (ref 125–200)
HDL: 82 mg/dL (ref >=46)
LDL CALC: 56 mg/dL (ref <130)
Total CHOL/HDL Ratio: 1.8 Ratio (ref <=5.0)
Triglycerides: 60 mg/dL (ref <150)
VLDL: 12 mg/dL (ref <30)

## 2014-12-02 LAB — POCT GLYCOSYLATED HEMOGLOBIN (HGB A1C): HEMOGLOBIN A1C: 7.4

## 2014-12-02 MED ORDER — CALCIUM CITRATE-VITAMIN D 500-400 MG-UNIT PO CHEW: 2.0000 | CHEWABLE_TABLET | Freq: Every day | ORAL | Status: DC

## 2014-12-02 NOTE — Patient Instructions (Addendum)
Checking labwork Sent in vitamin D to [facility] Pneumonia shot today Watch the pimple, can try warm compresses, let me know if worsens Call us with name of BP cuff  Will call or send letter with lab results Continue current medicine  Follow up in 3 months  Be well, Dr. Ardelia Mems   Health Maintenance Adopting a healthy lifestyle and getting preventive care can go a long way to promote health and wellness. Talk with your health care provider about what schedule of regular examinations is right for you. This is a good chance for you to check in with your provider about disease prevention and staying healthy. In between checkups, there are plenty of things you can do on your own. Experts have done a lot of research about which lifestyle changes and preventive measures are most likely to keep you healthy. Ask your health care provider for more information. WEIGHT AND DIET  Eat a healthy diet  Be sure to include plenty of vegetables, fruits, low-fat dairy products, and lean protein.  Do not eat a lot of foods high in solid fats, added sugars, or salt.  Get regular exercise. This is one of the most important things you can do for your health.  Most adults should exercise for at least 150 minutes each week. The exercise should increase your heart rate and make you sweat (moderate-intensity exercise).  Most adults should also do strengthening exercises at least twice a week. This is in addition to the moderate-intensity exercise.  Maintain a healthy weight  Body mass index (BMI) is a measurement that can be used to identify possible weight problems. It estimates body fat based on height and weight. Your health care provider can help determine your BMI and help you achieve or maintain a healthy weight.  For females 71 years of age and older:   A BMI below 18.5 is considered underweight.  A BMI of 18.5 to 24.9 is normal.  A BMI of 25 to 29.9 is considered overweight.  A BMI of 30 and  above is considered obese.  Watch levels of cholesterol and blood lipids  You should start having your blood tested for lipids and cholesterol at 68 years of age, then have this test every 5 years.  You may need to have your cholesterol levels checked more often if:  Your lipid or cholesterol levels are high.  You are older than 68 years of age.  You are at high risk for heart disease.  CANCER SCREENING   Lung Cancer  Lung cancer screening is recommended for adults 73-17 years old who are at high risk for lung cancer because of a history of smoking.  A yearly low-dose CT scan of the lungs is recommended for people who:  Currently smoke.  Have quit within the past 15 years.  Have at least a 30-pack-year history of smoking. A pack year is smoking an average of one pack of cigarettes a day for 1 year.  Yearly screening should continue until it has been 15 years since you quit.  Yearly screening should stop if you develop a health problem that would prevent you from having lung cancer treatment.  Breast Cancer  Practice breast self-awareness. This means understanding how your breasts normally appear and feel.  It also means doing regular breast self-exams. Let your health care provider know about any changes, no matter how small.  If you are in your 20s or 30s, you should have a clinical breast exam (CBE) by a health care  provider every 1-3 years as part of a regular health exam.  If you are 57 or older, have a CBE every year. Also consider having a breast X-ray (mammogram) every year.  If you have a family history of breast cancer, talk to your health care provider about genetic screening.  If you are at high risk for breast cancer, talk to your health care provider about having an MRI and a mammogram every year.  Breast cancer gene (BRCA) assessment is recommended for women who have family members with BRCA-related cancers. BRCA-related cancers  include:  Breast.  Ovarian.  Tubal.  Peritoneal cancers.  Results of the assessment will determine the need for genetic counseling and BRCA1 and BRCA2 testing. Cervical Cancer Routine pelvic examinations to screen for cervical cancer are no longer recommended for nonpregnant women who are considered low risk for cancer of the pelvic organs (ovaries, uterus, and vagina) and who do not have symptoms. A pelvic examination may be necessary if you have symptoms including those associated with pelvic infections. Ask your health care provider if a screening pelvic exam is right for you.   The Pap test is the screening test for cervical cancer for women who are considered at risk.  If you had a hysterectomy for a problem that was not cancer or a condition that could lead to cancer, then you no longer need Pap tests.  If you are older than 65 years, and you have had normal Pap tests for the past 10 years, you no longer need to have Pap tests.  If you have had past treatment for cervical cancer or a condition that could lead to cancer, you need Pap tests and screening for cancer for at least 20 years after your treatment.  If you no longer get a Pap test, assess your risk factors if they change (such as having a new sexual partner). This can affect whether you should start being screened again.  Some women have medical problems that increase their chance of getting cervical cancer. If this is the case for you, your health care provider may recommend more frequent screening and Pap tests.  The human papillomavirus (HPV) test is another test that may be used for cervical cancer screening. The HPV test looks for the virus that can cause cell changes in the cervix. The cells collected during the Pap test can be tested for HPV.  The HPV test can be used to screen women 24 years of age and older. Getting tested for HPV can extend the interval between normal Pap tests from three to five years.  An HPV  test also should be used to screen women of any age who have unclear Pap test results.  After 68 years of age, women should have HPV testing as often as Pap tests.  Colorectal Cancer  This type of cancer can be detected and often prevented.  Routine colorectal cancer screening usually begins at 68 years of age and continues through 68 years of age.  Your health care provider may recommend screening at an earlier age if you have risk factors for colon cancer.  Your health care provider may also recommend using home test kits to check for hidden blood in the stool.  A small camera at the end of a tube can be used to examine your colon directly (sigmoidoscopy or colonoscopy). This is done to check for the earliest forms of colorectal cancer.  Routine screening usually begins at age 22.  Direct examination of the colon  should be repeated every 5-10 years through 68 years of age. However, you may need to be screened more often if early forms of precancerous polyps or small growths are found. Skin Cancer  Check your skin from head to toe regularly.  Tell your health care provider about any new moles or changes in moles, especially if there is a change in a mole's shape or color.  Also tell your health care provider if you have a mole that is larger than the size of a pencil eraser.  Always use sunscreen. Apply sunscreen liberally and repeatedly throughout the day.  Protect yourself by wearing long sleeves, pants, a wide-brimmed hat, and sunglasses whenever you are outside. HEART DISEASE, DIABETES, AND HIGH BLOOD PRESSURE   Have your blood pressure checked at least every 1-2 years. High blood pressure causes heart disease and increases the risk of stroke.  If you are between 55 years and 79 years old, ask your health care provider if you should take aspirin to prevent strokes.  Have regular diabetes screenings. This involves taking a blood sample to check your fasting blood sugar  level.  If you are at a normal weight and have a low risk for diabetes, have this test once every three years after 68 years of age.  If you are overweight and have a high risk for diabetes, consider being tested at a younger age or more often. PREVENTING INFECTION  Hepatitis B  If you have a higher risk for hepatitis B, you should be screened for this virus. You are considered at high risk for hepatitis B if:  You were born in a country where hepatitis B is common. Ask your health care provider which countries are considered high risk.  Your parents were born in a high-risk country, and you have not been immunized against hepatitis B (hepatitis B vaccine).  You have HIV or AIDS.  You use needles to inject street drugs.  You live with someone who has hepatitis B.  You have had sex with someone who has hepatitis B.  You get hemodialysis treatment.  You take certain medicines for conditions, including cancer, organ transplantation, and autoimmune conditions. Hepatitis C  Blood testing is recommended for:  Everyone born from 1945 through 1965.  Anyone with known risk factors for hepatitis C. Sexually transmitted infections (STIs)  You should be screened for sexually transmitted infections (STIs) including gonorrhea and chlamydia if:  You are sexually active and are younger than 68 years of age.  You are older than 68 years of age and your health care provider tells you that you are at risk for this type of infection.  Your sexual activity has changed since you were last screened and you are at an increased risk for chlamydia or gonorrhea. Ask your health care provider if you are at risk.  If you do not have HIV, but are at risk, it may be recommended that you take a prescription medicine daily to prevent HIV infection. This is called pre-exposure prophylaxis (PrEP). You are considered at risk if:  You are sexually active and do not regularly use condoms or know the HIV status  of your partner(s).  You take drugs by injection.  You are sexually active with a partner who has HIV. Talk with your health care provider about whether you are at high risk of being infected with HIV. If you choose to begin PrEP, you should first be tested for HIV. You should then be tested every 3 months for   as long as you are taking PrEP.  PREGNANCY   If you are premenopausal and you may become pregnant, ask your health care provider about preconception counseling.  If you may become pregnant, take 400 to 800 micrograms (mcg) of folic acid every day.  If you want to prevent pregnancy, talk to your health care provider about birth control (contraception). OSTEOPOROSIS AND MENOPAUSE   Osteoporosis is a disease in which the bones lose minerals and strength with aging. This can result in serious bone fractures. Your risk for osteoporosis can be identified using a bone density scan.  If you are 65 years of age or older, or if you are at risk for osteoporosis and fractures, ask your health care provider if you should be screened.  Ask your health care provider whether you should take a calcium or vitamin D supplement to lower your risk for osteoporosis.  Menopause may have certain physical symptoms and risks.  Hormone replacement therapy may reduce some of these symptoms and risks. Talk to your health care provider about whether hormone replacement therapy is right for you.  HOME CARE INSTRUCTIONS   Schedule regular health, dental, and eye exams.  Stay current with your immunizations.   Do not use any tobacco products including cigarettes, chewing tobacco, or electronic cigarettes.  If you are pregnant, do not drink alcohol.  If you are breastfeeding, limit how much and how often you drink alcohol.  Limit alcohol intake to no more than 1 drink per day for nonpregnant women. One drink equals 12 ounces of beer, 5 ounces of wine, or 1 ounces of hard liquor.  Do not use street  drugs.  Do not share needles.  Ask your health care provider for help if you need support or information about quitting drugs.  Tell your health care provider if you often feel depressed.  Tell your health care provider if you have ever been abused or do not feel safe at home. Document Released: 10/30/2010 Document Revised: 08/31/2013 Document Reviewed: 03/18/2013 Exit[employer] Patient Information 2015 [employer]. This information is not intended to replace advice given to you by your health care provider. Make sure you discuss any questions you have with your health care provider.  

## 2014-12-03 LAB — URINE CYTOLOGY ANCILLARY ONLY
Chlamydia: NEGATIVE
Neisseria Gonorrhea: NEGATIVE
Trichomonas: NEGATIVE

## 2014-12-03 NOTE — Progress Notes (Signed)
Patient ID: Mallory Hayes, female   DOB: 06/01/1945, 69 y.o.   MRN: [id number]   HPI:  Patient presents today for a well woman exam.   Concerns today: pimple R thigh Periods: none Contraception: postmenopausal Pelvic symptoms: none Sexual activity: one female partner in the last year STD Screening: would like this today Pap smear status: apparent normal in 2007 and 2012. Denies any hx of abnormal paps Exercise: walks daily Diet: eats healthy Smoking: no Alcohol: no Drugs: no  HTN - stopped norvasc because it made her feel funny  ROS: See HPI. Denies CP or SOB. Feels well. Sleeping well on new mattress.  [place]:  Cancers in family: mother had ovarian cancer, diagnosed at age 14  PHYSICAL EXAM: BP 128/53 mmHg  Pulse 48  Temp(Src) 98.2 F (36.8 C) (Oral)  Ht 5' 1"  (1.549 m)  Wt 111 lb (50.349 kg)  BMI 20.98 kg/m2 Gen: NAD, pleasant, cooperative HEENT: NCAT, PERRL, no palpable thyromegaly or anterior cervical lymphadenopathy Heart: RRR, no murmurs Lungs: CTAB, NWOB Abdomen: soft, nontender to palpation Neuro: grossly nonfocal, speech normal Skin: small open comedone to R inner thigh near genitals. Small amount of material easily expressed with improvement in size of comedone. Diabetic foot exam: 2+ DP pulses bilat, normal monofilament testing bilaterally. No lesions or significant calluses.   ASSESSMENT/PLAN:  # Health maintenance:  -STD screening: gc/chl/trich today off urine. Declines HIV or hep C testing today. -pap smear: discussed option of stopping screening with patient. She technically has not met 10 years of negative paps preceding age 89 (last documented 2007 and 2012). She declined pap today. Can revisit in future if pt desires. -mammogram: UTD -lipid screening: check lipids today -handout given on health maintenance topics  Comdedone R inner thigh - material expressed today. Does not seem infected. encourage warm compresses.  Type 2 diabetes mellitus A1c 7.4.  Good control. Continue current regimen. F/u 3 mos.  Cardiac: on statin, aspirin Renal: on ACE Eye: discuss at next visit Foot: normal foot exam today Immunizations: PNA vaccine today   HYPERTENSION, BENIGN SYSTEMIC Well controlled off amlodipine. Continue current regimen.     FOLLOW UP: F/u in 3 months for diabetes  [place] J. Ardelia Mems, [place]

## 2014-12-06 NOTE — Assessment & Plan Note (Signed)
Well controlled off amlodipine. Continue current regimen.

## 2014-12-06 NOTE — Assessment & Plan Note (Signed)
A1c 7.4. Good control. Continue current regimen. F/u 3 mos.  Cardiac: on statin, aspirin Renal: on ACE Eye: discuss at next visit Foot: normal foot exam today Immunizations: PNA vaccine today

## 2014-12-10 ENCOUNTER — Encounter: Payer: Self-pay | Admitting: Family Medicine

## 2014-12-31 ENCOUNTER — Other Ambulatory Visit: Payer: Self-pay | Admitting: *Deleted

## 2014-12-31 MED ORDER — CALCIUM CITRATE-VITAMIN D 500-400 MG-UNIT PO CHEW: 2.0000 | CHEWABLE_TABLET | Freq: Every day | ORAL | Status: DC

## 2015-01-05 ENCOUNTER — Encounter: Payer: Self-pay | Admitting: Family Medicine

## 2015-01-05 ENCOUNTER — Ambulatory Visit (INDEPENDENT_AMBULATORY_CARE_PROVIDER_SITE_OTHER): Payer: Medicare HMO | Admitting: Family Medicine

## 2015-01-05 VITALS — BP 119/49 | HR 67 | Temp 97.7°F | Wt 111.1 lb

## 2015-01-05 DIAGNOSIS — Z23 Encounter for immunization: Secondary | ICD-10-CM

## 2015-01-05 DIAGNOSIS — R238 Other skin changes: Secondary | ICD-10-CM | POA: Diagnosis not present

## 2015-01-05 MED ORDER — HYDROCORTISONE 0.5 % EX CREA: 1.0000 "application " | TOPICAL_CREAM | Freq: Two times a day (BID) | CUTANEOUS | Status: DC | PRN

## 2015-01-05 NOTE — Patient Instructions (Signed)
Warm compresses twice per day until these go away or are ready to pop Come back if redness, pain, worsening Itch cream twice a day as needed - sent in to your pharmacy  Be well, Dr. Ardelia Mems

## 2015-01-05 NOTE — Progress Notes (Signed)
Patient ID: Mallory Hayes, female   DOB: November 17, 1945, 69 y.o.   MRN: [id number]  HPI:  Pt presents for a same day appointment to discuss bumps in inner thighs. Has one bump on each side of inguinal area, in pubic hair region. Present for several days. Itchy at night. No other bumps elsewhere. No sores in mouth or mucous membranes of genitals. No fevers, pain, new medicines/foods. No drainage.  ROS: See HPI  [place]: hx allergic rhintiis, anxiety, constipation, depression, HLD, HTN, T2DM  PHYSICAL EXAM: BP 140/47 mmHg  Pulse 69  Temp(Src) 97.7 F (36.5 C)  Wt 111 lb 1.6 oz (50.395 kg) Gen: NAD, pleasant, cooperative Skin: small papule present on each R and L inguinal groin area within pubic hair distribution. No redness, tenderness, flucutance, surrounding erythema, skin breakdown, or drainage.  ASSESSMENT/PLAN:  1. Bumps on thighs: appearance consistent with two small pimples. No signs of infection. Nothing drainable on exam today. Recommend warm compresses several times per day. Return if worsening. rx given for mild topical steroid cream to use prn itching. F/u as needed.  FOLLOW UP: F/u as needed if symptoms worsen or do not improve.   [place]. Ardelia Mems, [place]

## 2015-01-10 ENCOUNTER — Other Ambulatory Visit: Payer: Self-pay | Admitting: *Deleted

## 2015-01-11 MED ORDER — MOMETASONE FUROATE 50 MCG/ACT NA SUSP: 2.0000 | Freq: Every day | NASAL | Status: DC

## 2015-01-17 ENCOUNTER — Telehealth: Payer: Self-pay | Admitting: *Deleted

## 2015-01-17 NOTE — Telephone Encounter (Signed)
Received a refill from [facility] for Vitamin D2 50,000 Unit capsule.  Did not see medication on med list.  Fax refill request placed in provider box for review.  Derl Barrow, RN

## 2015-01-18 NOTE — Telephone Encounter (Signed)
Reviewed. I did not prescribe this. Will fax back as declined.  [name], MD

## 2015-01-22 ENCOUNTER — Other Ambulatory Visit: Payer: Self-pay | Admitting: Family Medicine

## 2015-01-24 NOTE — Telephone Encounter (Signed)
Red team, can you call patient and clarify the dose she is taking of lisinopril-HCTZ (how many mg in tablets, and how many tablets per day). I received a refill request for a different dose than we have her on in the system and want to clarify before sending in new rx.  Thanks, [name], MD

## 2015-01-25 NOTE — Telephone Encounter (Signed)
Spoke with patient, she reports taking the 20-12.5mg  tablet, 1 tablet twice daily. FYI to MD

## 2015-02-25 ENCOUNTER — Other Ambulatory Visit: Payer: Self-pay | Admitting: Family Medicine

## 2015-03-01 ENCOUNTER — Ambulatory Visit (INDEPENDENT_AMBULATORY_CARE_PROVIDER_SITE_OTHER): Payer: Medicare HMO | Admitting: Podiatry

## 2015-03-01 DIAGNOSIS — M79676 Pain in unspecified toe(s): Secondary | ICD-10-CM

## 2015-03-01 DIAGNOSIS — B351 Tinea unguium: Secondary | ICD-10-CM | POA: Diagnosis not present

## 2015-03-01 NOTE — Progress Notes (Signed)
Patient ID: Mallory Hayes, female   DOB: [birth year], 69 y.o.   MRN: [id number] Complaint:  Visit Type: Patient returns to my office for continued preventative foot care services. Complaint: Patient states" my nails have grown long and thick and become painful to walk and wear shoes" Patient has been diagnosed with DM with no foot complications. The patient presents for preventative foot care services. No changes to ROS  Podiatric Exam: Vascular: dorsalis pedis and posterior tibial pulses are palpable bilateral. Capillary return is immediate. Temperature gradient is WNL. Skin turgor WNL  Sensorium: Normal Semmes Weinstein monofilament test. Normal tactile sensation bilaterally. Nail Exam: Pt has thick disfigured discolored nails with subungual debris noted bilateral entire nail hallux through fifth toenails Ulcer Exam: There is no evidence of ulcer or pre-ulcerative changes or infection. Orthopedic Exam: Muscle tone and strength are WNL. No limitations in general ROM. No crepitus or effusions noted. Foot type and digits show no abnormalities. Bony prominences are unremarkable. Skin: No Porokeratosis. No infection or ulcers  Diagnosis:  Onychomycosis, , Pain in right toe, pain in left toes  Treatment & Plan Procedures and Treatment: Consent by patient was obtained for treatment procedures. The patient understood the discussion of treatment and procedures well. All questions were answered thoroughly reviewed. Debridement of mycotic and hypertrophic toenails, 1 through 5 bilateral and clearing of subungual debris. No ulceration, no infection noted.  Return Visit-Office Procedure: Patient instructed to return to the office for a follow up visit 3 months for continued evaluation and treatment.

## 2015-03-15 ENCOUNTER — Telehealth: Payer: Self-pay | Admitting: *Deleted

## 2015-03-15 NOTE — Telephone Encounter (Signed)
Received fax from [facility] requesting Detrol LA for the patient.  This medication is not on her current list, will forward to MD. Clinton Sawyer, [place]

## 2015-03-15 NOTE — Telephone Encounter (Signed)
LMOVM for pt to call us back. Dr Ardelia Mems said she can try over the counter mucinex and if that doesn't work she should come in to be evaluated. Also she wants to know if she is taking detrol. Deseree Kennon Holter, CMA

## 2015-03-15 NOTE — Telephone Encounter (Signed)
Has a cold.  Has congestion in her head and a whooping cough.  Doesn't feel like coming in.  Could dr call in something?

## 2015-03-16 NOTE — Telephone Encounter (Signed)
Message given.  Pt is not taking detrol

## 2015-04-06 ENCOUNTER — Encounter: Payer: Self-pay | Admitting: Family Medicine

## 2015-04-06 ENCOUNTER — Ambulatory Visit (INDEPENDENT_AMBULATORY_CARE_PROVIDER_SITE_OTHER): Payer: Medicare HMO | Admitting: Family Medicine

## 2015-04-06 VITALS — BP 101/47 | HR 55 | Temp 97.9°F | Ht 61.0 in | Wt 109.7 lb

## 2015-04-06 DIAGNOSIS — B9789 Other viral agents as the cause of diseases classified elsewhere: Principal | ICD-10-CM

## 2015-04-06 DIAGNOSIS — S46811A Strain of other muscles, fascia and tendons at shoulder and upper arm level, right arm, initial encounter: Secondary | ICD-10-CM | POA: Diagnosis not present

## 2015-04-06 DIAGNOSIS — J069 Acute upper respiratory infection, unspecified: Secondary | ICD-10-CM

## 2015-04-06 MED ORDER — BACLOFEN 10 MG PO TABS: 5.0000 mg | ORAL_TABLET | Freq: Three times a day (TID) | ORAL | Status: DC | PRN

## 2015-04-06 NOTE — Progress Notes (Signed)
Subjective:    Patient ID: Mallory Hayes, female    DOB: [birth year], 69 y.o.   MRN: [id number]  Mallory Hayes is a 69 y.o. female presenting on 04/06/2015 for Torticollis and Fever  Patient presents for a same day appointment.  HPI  NECK PAIN / FEVERS / FATIGUE: - Reports symptoms started 2 weeks ago with productive cough was advised to take Mucinex, Nasonex, and congestion mostly resolved, now with only lingering cough non-productive. Recent worsening symptoms started Sunday 12/4 after being out in cold and rain after church, she described feeling "feverish" and fatigued, did not measure temp and no recorded fever. Her fatigue and decreased energy, URI symptoms and appetite have all improved over past few days, and today feels a little better. - Additionally complains of neck/shoulder Right sided pain started Monday, seems to have followed several hours of outdoor work on Sunday with a lot of raking, she is unsure if she strained a muscle. - No similar symptoms like this before. Normally she is active outdoors, walking and active. Denies any injury or trauma recently. - Received flu shot in 01/2015   Past Medical History  Diagnosis Date  . Diabetes mellitus   . Hypertension   . GERD (gastroesophageal reflux disease)   . Urinary incontinence   . Sleep apnea     Social History   Social History  . Marital Status: Single    Spouse Name: N/A  . Number of Children: N/A  . Years of Education: N/A   Occupational History  . Not on file.   Social History Main Topics  . Smoking status: Never Smoker   . Smokeless tobacco: Never Used  . Alcohol Use: No  . Drug Use: No  . Sexual Activity: No   Other Topics Concern  . Not on file   Social History Narrative   Pt's 46 yo mother died August 27, 2010 of breast cancer. Pt was very close with mother.  Has 2 siblings that live out of town, but has some cousins and friends in the [place] area.   Retired, never married   No h/o tob, EToH, or drug use     Enjoys walking, reading, dining out, and [employer]    Current Outpatient Prescriptions on File Prior to Visit  Medication Sig  . ACCU-CHEK FASTCLIX LANCETS MISC Check blood sugar daily  . aspirin 81 MG tablet Take 81 mg by mouth daily.    . Blood Glucose Monitoring Suppl (BLOOD GLUCOSE METER) kit Use as instructed  . Blood Pressure Monitor DEVI Check blood pressure 3x weekly.  . calcium citrate-vitamin D 500-400 MG-UNIT chewable tablet Chew 2 tablets by mouth daily.  Marland Kitchen glucose blood (ACCU-CHEK SMARTVIEW) test strip Use as instructed  . hydrocortisone cream 0.5 % Apply 1 application topically 2 (two) times daily as needed for itching.  Elmore Guise Device MISC Check CBG once daily in AM  . lisinopril-hydrochlorothiazide (PRINZIDE,ZESTORETIC) 20-12.5 MG tablet TAKE 2 TABLETS BY MOUTH DAILY.  . metFORMIN (GLUCOPHAGE-XR) 500 MG 24 hr tablet TAKE 2 TABLETS EVERY DAY  WITH  BREAKFAST  . mometasone (NASONEX) 50 MCG/ACT nasal spray Place 2 sprays into the nose daily.  . pantoprazole (PROTONIX) 40 MG tablet Take 1 tablet (40 mg total) by mouth daily.  . polyethylene glycol powder (GLYCOLAX/MIRALAX) powder TAKE 17 G (MIXED IN LIQUID) DAILY AS NEEDED FOR CONSTIPATION  . rosuvastatin (CRESTOR) 20 MG tablet Take 1 tablet (20 mg total) by mouth at bedtime.  . vitamin E 200 UNIT capsule Take 200  Units by mouth daily.     No current facility-administered medications on file prior to visit.    Review of Systems  Constitutional: Positive for fever (subjective) and appetite change (reduced appetite, improving). Negative for chills, diaphoresis, activity change and fatigue.  HENT: Negative for congestion, postnasal drip, sinus pressure and sneezing.   Respiratory: Positive for cough (mostly resolved, now lingering dry). Negative for chest tightness, shortness of breath and wheezing.   Cardiovascular: Negative for chest pain and leg swelling.  Gastrointestinal: Negative for nausea, vomiting,  abdominal pain, diarrhea and constipation.  Genitourinary: Negative for dysuria and frequency.  Musculoskeletal: Positive for neck pain (right upper shoulder lower neck muscle). Negative for arthralgias.  Skin: Negative for rash.  Neurological: Positive for weakness (generalized, improving). Negative for dizziness and headaches.   Per HPI unless specifically indicated above     Objective:    BP 101/47 mmHg  Pulse 55  Temp(Src) 97.9 F (36.6 C) (Oral)  Ht _0  (1.549 m)  Wt 109 lb 11.2 oz (49.76 kg)  BMI 20.74 kg/m2  Wt Readings from Last 3 Encounters:  04/06/15 109 lb 11.2 oz (49.76 kg)  01/05/15 111 lb 1.6 oz (50.395 kg)  12/02/14 111 lb (50.349 kg)    Physical Exam  Constitutional: She appears well-developed and well-nourished. No distress.  Comfortable, pleasant, well-appearing  HENT:  Head: Normocephalic and atraumatic.  Nose: Nose normal.  Mouth/Throat: Oropharynx is clear and moist. No oropharyngeal exudate.  Eyes: Conjunctivae are normal.  Neck: Normal range of motion. Neck supple. No spinous process tenderness and no muscular tenderness present. No edema and no erythema present. No thyromegaly present.    Cardiovascular: Normal rate, regular rhythm, normal heart sounds and intact distal pulses.   No murmur heard. Pulmonary/Chest: Effort normal and breath sounds normal. No respiratory distress. She has no wheezes. She has no rales.  Musculoskeletal: Normal range of motion. She exhibits no edema or tenderness.  Lymphadenopathy:    She has no cervical adenopathy.  Neurological: She is alert.  Skin: Skin is warm and dry. No rash noted. She is not diaphoretic.  Nursing note and vitals reviewed.  Results for orders placed or performed in visit on 12/02/14  Comprehensive metabolic panel  Result Value Ref Range   Sodium 137 135 - 146 mmol/L   Potassium 3.5 3.5 - 5.3 mmol/L   Chloride 96 (L) 98 - 110 mmol/L   CO2 29 20 - 31 mmol/L   Glucose, Bld 124 (H) 65 - 99  mg/dL   BUN 21 7 - 25 mg/dL   Creat 1.15 (H) 0.50 - 0.99 mg/dL   Total Bilirubin 0.6 0.2 - 1.2 mg/dL   Alkaline Phosphatase 51 33 - 130 U/L   AST 22 10 - 35 U/L   ALT 17 6 - 29 U/L   Total Protein 7.5 6.1 - 8.1 g/dL   Albumin 4.4 3.6 - 5.1 g/dL   Calcium 10.0 8.6 - 10.4 mg/dL  Lipid panel  Result Value Ref Range   Cholesterol 150 125 - 200 mg/dL   Triglycerides 60 <150 mg/dL   HDL 82 >=46 mg/dL   Total CHOL/HDL Ratio 1.8 <=5.0 Ratio   VLDL 12 <30 mg/dL   LDL Cholesterol 56 <130 mg/dL  POCT HgB A1C  Result Value Ref Range   Hemoglobin A1C 7.4   Urine cytology ancillary only  Result Value Ref Range   Chlamydia Negative    Neisseria gonorrhea Negative    Trichomonas Negative  Assessment & Plan:   Problem List Items Addressed This Visit    Strain of right trapezius muscle    Consistent with Right trapezius muscle spasm with radiating neck to shoulder pain. Possible strain/injury due to excessive raking activity day before onset. History of similar muscle strain on chart 3 years ago. No neurological deficits or weakness.  Plan: 1. Start Baclofen 5-44m up to TID PRN, mostly at night 2. Considered NSAID trial, patient elected for muscle relaxant since more stiffness spasm and less pain 3. Recommend starting Tylenol regularly over 1-2 weeks, heating pad vs moist heat, stretching, avoid heavy lifting / repetitive activities 4. Follow-up       Relevant Medications   baclofen (LIORESAL) 10 MG tablet   Viral URI with cough - Primary    Consistent with viral URI x 2 weeks ago since mostly resolved now worsening following recent exposure out rain/cold. No sick contacts.  Afebrile, currently well-appearing and non-toxic, well hydrated on exam, no focal signs of infection (ears, throat, lungs clear). - Differential includes sub-clinical CAP with generalized symptoms,  Less likely Flu (s/p vaccine, but possible, already >3-4 days)  Plan: 1. Reassurance, likely self-limited 2.  Supportive care, improve hydration, improve diet, recommend few days of Ensure / Boost to improve energy 3. Discussed possible CXR, but decision to defer for 1 week and follow-up, consider CXR and empiric antibiotics if not improved and still suspect PNA 4. Tylenol / Motrin PRN fevers 5. Return criteria given          Meds ordered this encounter  Medications  . baclofen (LIORESAL) 10 MG tablet    Sig: Take 0.5-1 tablets (5-10 mg total) by mouth 3 (three) times daily as needed for muscle spasms. Start half tab nightly, gradually increase dose over 1 week as advised.    Dispense:  30 each    Refill:  0      Follow up plan: Return in about 1 week (around 04/13/2015), or if symptoms worsen or fail to improve, for URI vs pneumonia.  ANobie Putnam DM[facility] PGY-3

## 2015-04-06 NOTE — Patient Instructions (Signed)
Thank you for coming in to clinic today.  1. You have a Right shoulder muscle (Trapezius) strain likely due to raking recently. - Recommend using heating pad - Rest and avoid straining activities for next 1-2 weeks  Start taking Baclofen (Lioresal) 10mg  (muscle relaxant) - start with one pill at night as needed for next 1-3 nights (may make you drowsy, caution with driving) see how it affects you, then if tolerated increase to one pill 2 to 3 times a day or (every 8 hours as needed)  Recommend to start taking Tylenol Extra Strength 500mg  tabs - take 1 to 2 tabs (max 1000mg  per dose) every 6 hours for pain (take regularly, don't skip a dose for next 3-7 days), max 24 hour daily dose is 6 to 8 tablets or 3000 to 4000mg   2. For your other symptoms, it sounds like you may be recovering still from your prior viral infection with cough, I do not think this is a Pneumonia, but you may be worn down and could develop a pneumonia. - Recommend close watch for now, and no antibiotics today - If no improvement by early next week, recommend return for considering a Chest X-ray and possible Antibiotics - If worsening sooner, with higher fevers with sweats, return of productive cough, or new symptoms, poor appetite worsening or vomiting, you may return or go to Emergency Department.  - Try Ensure or Boost for next few days to improve appetite  Please schedule a follow-up appointment with Dr Ardelia Mems or myself within 1 week to follow-up Generalized Weakness and Reduced Appetite, rule out pneumonia  If you have any other questions or concerns, please feel free to call the clinic to contact me. You may also schedule an earlier appointment if necessary.  However, if your symptoms get significantly worse, please go to the Emergency Department to seek immediate medical attention.  Nobie Putnam, [place]

## 2015-04-07 NOTE — Assessment & Plan Note (Signed)
Consistent with viral URI x 2 weeks ago since mostly resolved now worsening following recent exposure out rain/cold. No sick contacts.  Afebrile, currently well-appearing and non-toxic, well hydrated on exam, no focal signs of infection (ears, throat, lungs clear). - Differential includes sub-clinical CAP with generalized symptoms,  Less likely Flu (s/p vaccine, but possible, already >3-4 days)  Plan: 1. Reassurance, likely self-limited 2. Supportive care, improve hydration, improve diet, recommend few days of Ensure / Boost to improve energy 3. Discussed possible CXR, but decision to defer for 1 week and follow-up, consider CXR and empiric antibiotics if not improved and still suspect PNA 4. Tylenol / Motrin PRN fevers 5. Return criteria given

## 2015-04-07 NOTE — Assessment & Plan Note (Signed)
Consistent with Right trapezius muscle spasm with radiating neck to shoulder pain. Possible strain/injury due to excessive raking activity day before onset. History of similar muscle strain on chart 3 years ago. No neurological deficits or weakness.  Plan: 1. Start Baclofen 5-10mg  up to TID PRN, mostly at night 2. Considered NSAID trial, patient elected for muscle relaxant since more stiffness spasm and less pain 3. Recommend starting Tylenol regularly over 1-2 weeks, heating pad vs moist heat, stretching, avoid heavy lifting / repetitive activities 4. Follow-up

## 2015-04-18 ENCOUNTER — Other Ambulatory Visit: Payer: Self-pay | Admitting: *Deleted

## 2015-04-19 MED ORDER — MOMETASONE FUROATE 50 MCG/ACT NA SUSP: 2.0000 | Freq: Every day | NASAL | Status: DC

## 2015-05-11 ENCOUNTER — Other Ambulatory Visit: Payer: Self-pay | Admitting: Family Medicine

## 2015-05-12 ENCOUNTER — Telehealth: Payer: Self-pay | Admitting: *Deleted

## 2015-05-12 NOTE — Telephone Encounter (Signed)
Called patient to inquire about a fax received from [facility].  A refill request was received for Detrol LA 4 mg capsule.  Pt stated she no longer is taken this medication.  [facility] stating patient is no longer taking the medication.  Patient also wanted to know when last A1c was completed.  Last A1c was done 11/2014.  Appointment made for tomorrow 05/13/15 at 11:30 AM with PCP for A1c and DM follow up.  Derl Barrow, RN

## 2015-05-13 ENCOUNTER — Ambulatory Visit: Payer: Medicare HMO | Admitting: Family Medicine

## 2015-05-23 ENCOUNTER — Encounter: Payer: Self-pay | Admitting: Family Medicine

## 2015-05-23 ENCOUNTER — Ambulatory Visit (INDEPENDENT_AMBULATORY_CARE_PROVIDER_SITE_OTHER): Payer: Medicare HMO | Admitting: Family Medicine

## 2015-05-23 VITALS — BP 118/50 | HR 45 | Temp 97.5°F | Wt 114.0 lb

## 2015-05-23 DIAGNOSIS — R011 Cardiac murmur, unspecified: Secondary | ICD-10-CM

## 2015-05-23 DIAGNOSIS — I10 Essential (primary) hypertension: Secondary | ICD-10-CM

## 2015-05-23 DIAGNOSIS — E1169 Type 2 diabetes mellitus with other specified complication: Secondary | ICD-10-CM | POA: Diagnosis not present

## 2015-05-23 LAB — POCT GLYCOSYLATED HEMOGLOBIN (HGB A1C): Hemoglobin A1C: 7.9

## 2015-05-23 MED ORDER — TETANUS-DIPHTH-ACELL PERTUSSIS 5-2.5-18.5 LF-MCG/0.5 IM SUSP: 0.5000 mL | Freq: Once | INTRAMUSCULAR | Status: DC

## 2015-05-23 NOTE — Patient Instructions (Signed)
See handout on tetanus shot  For diabetic nutrition: Call Dr. Jenne Campus (our nutritionist) to set up an appointment. Her phone number is: [phone].  Decrease your BP medicine to one pill per day. Follow up in 2 weeks for nurse BP check.  Stop the lotions/creams/vaseline in your genital area. Okay to continue warm compresses as needed  See me in 3 months  Be well, Dr. Ardelia Mems

## 2015-05-26 ENCOUNTER — Telehealth: Payer: Self-pay | Admitting: *Deleted

## 2015-05-26 NOTE — Telephone Encounter (Signed)
Left message on voicemail informing of  Echo appointment at [facility] on 2/8 at 11:30am.

## 2015-05-26 NOTE — Telephone Encounter (Signed)
-----   Message from [name], MD sent at 05/23/2015  2:34 PM EST ----- Please schedule echo appointment & call patient with appointment time.  Thanks, Mallory Hayes

## 2015-05-29 DIAGNOSIS — R011 Cardiac murmur, unspecified: Secondary | ICD-10-CM | POA: Insufficient documentation

## 2015-05-29 NOTE — Assessment & Plan Note (Signed)
Newly heard during today's visit. Will check echo per patient preference.

## 2015-05-29 NOTE — Progress Notes (Signed)
Date of Visit: 05/23/2015   HPI:  diabetes - currently taking metformin XR 1000mg  daily (takes 2 500mg  tabs). Tolerating this medication well. Walks daily. Would like to visit nutritionist to learn about what foods are good to eat with her diabetes.  Hypertension - taking lisinopril-hctz 20-12.5mg  2 tabs daily. No CP or shortness of breath. Occasional mild lightheadedness  Pimples in genital area - has one on her back, and a couple in genital area. Does not shave. Uses dove soap, jergens lotion, avon cream & lotion, and vaseline all in her genital area.   ROS: See HPI.  [place]: history of type 2 diabetes, hyperlipidemia, hypertension, gerd, constipation  PHYSICAL EXAM: BP 118/50 mmHg  Pulse 45  Temp(Src) 97.5 F (36.4 C) (Oral)  Wt 114 lb (51.71 kg)  SpO2 98% Gen: NAD, pleasant, cooperative HEENT: NCAT Heart: regular rate and rhythm, 2/6 systolic murmur loudest at RUSB Lungs: clear to auscultation bilaterally, normal work of breathing  Neuro: alert, grossly nonfocal, speech normal Ext: No appreciable lower extremity edema bilaterally  Skin: a few scattered blackheads/clogged pores in genital area. No skin breakdown, tenderness, or drainage. Healing pimple on back, does not appear infected.  ASSESSMENT/PLAN:  Health maintenance:  -handout given on how to get tetanus vaccine  Murmur, heart Newly heard during today's visit. Will check echo per patient preference.  Type 2 diabetes mellitus A1c 7.9. Would like slightly better control. Patient motivated to make dietary changes. Info given on Dr. Jenne Campus' #. Follow up with me in 3 months  Cardiac: on statin, aspirin Renal: on ACE Eye: due April 2017 Foot: due August 2017 Immunizations: tdap info given today   HYPERTENSION, BENIGN SYSTEMIC Blood pressure noted borderline low for last several visits. Will decrease to 1 pill of lisin-HCTZ. Follow up in 2 wks for BP check   pimples in genital area - likely related to applying so  many creams/lotions to genital area. Recommend water based lubricant if she needs it, otherwise nothing in vaginal area. Recommend warm compresses as needed    FOLLOW UP: Follow up in 2 weeks for nurse BP check See me in 3 mos  [place] J. Ardelia Mems, [place]

## 2015-05-29 NOTE — Assessment & Plan Note (Signed)
Blood pressure noted borderline low for last several visits. Will decrease to 1 pill of lisin-HCTZ. Follow up in 2 wks for BP check

## 2015-05-29 NOTE — Assessment & Plan Note (Signed)
A1c 7.9. Would like slightly better control. Patient motivated to make dietary changes. Info given on Dr. Jenne Campus' #. Follow up with me in 3 months  Cardiac: on statin, aspirin Renal: on ACE Eye: due April 2017 Foot: due August 2017 Immunizations: tdap info given today

## 2015-06-08 ENCOUNTER — Ambulatory Visit (HOSPITAL_COMMUNITY): Payer: Medicare HMO | Attending: Cardiovascular Disease

## 2015-06-08 ENCOUNTER — Encounter: Payer: Self-pay | Admitting: Family Medicine

## 2015-06-08 ENCOUNTER — Other Ambulatory Visit: Payer: Self-pay

## 2015-06-08 DIAGNOSIS — I10 Essential (primary) hypertension: Secondary | ICD-10-CM | POA: Diagnosis not present

## 2015-06-08 DIAGNOSIS — E119 Type 2 diabetes mellitus without complications: Secondary | ICD-10-CM | POA: Diagnosis not present

## 2015-06-08 DIAGNOSIS — R011 Cardiac murmur, unspecified: Secondary | ICD-10-CM

## 2015-06-08 DIAGNOSIS — E785 Hyperlipidemia, unspecified: Secondary | ICD-10-CM | POA: Insufficient documentation

## 2015-06-14 ENCOUNTER — Ambulatory Visit (INDEPENDENT_AMBULATORY_CARE_PROVIDER_SITE_OTHER): Payer: Medicare HMO | Admitting: Podiatry

## 2015-06-14 ENCOUNTER — Encounter: Payer: Self-pay | Admitting: Podiatry

## 2015-06-14 DIAGNOSIS — B351 Tinea unguium: Secondary | ICD-10-CM

## 2015-06-14 DIAGNOSIS — M79676 Pain in unspecified toe(s): Secondary | ICD-10-CM

## 2015-06-14 NOTE — Progress Notes (Signed)
Patient ID: Mallory Hayes, female   DOB: [birth year], 70 y.o.   MRN: [id number] Complaint:  Visit Type: Patient returns to my office for continued preventative foot care services. Complaint: Patient states" my nails have grown long and thick and become painful to walk and wear shoes" Patient has been diagnosed with DM with no foot complications. The patient presents for preventative foot care services. No changes to ROS  Podiatric Exam: Vascular: dorsalis pedis and posterior tibial pulses are palpable bilateral. Capillary return is immediate. Temperature gradient is WNL. Skin turgor WNL  Sensorium: Normal Semmes Weinstein monofilament test. Normal tactile sensation bilaterally. Nail Exam: Pt has thick disfigured discolored nails with subungual debris noted bilateral entire nail hallux through fifth toenails Ulcer Exam: There is no evidence of ulcer or pre-ulcerative changes or infection. Orthopedic Exam: Muscle tone and strength are WNL. No limitations in general ROM. No crepitus or effusions noted. Foot type and digits show no abnormalities. Bony prominences are unremarkable. Skin: No Porokeratosis. No infection or ulcers  Diagnosis:  Onychomycosis, , Pain in right toe, pain in left toes  Treatment & Plan Procedures and Treatment: Consent by patient was obtained for treatment procedures. The patient understood the discussion of treatment and procedures well. All questions were answered thoroughly reviewed. Debridement of mycotic and hypertrophic toenails, 1 through 5 bilateral and clearing of subungual debris. No ulceration, no infection noted.  Patient interested in discussing correction of her bunion with one of the surgeons. Refer to Dr. Jacqualyn Posey. Return Visit-Office Procedure: Patient instructed to return to the office for a follow up visit 3 months for continued evaluation and treatment.   Gardiner Barefoot DPM

## 2015-07-05 ENCOUNTER — Ambulatory Visit: Payer: Medicare HMO | Admitting: Podiatry

## 2015-07-11 ENCOUNTER — Other Ambulatory Visit: Payer: Self-pay | Admitting: *Deleted

## 2015-07-11 ENCOUNTER — Ambulatory Visit: Payer: Medicare HMO | Admitting: Podiatry

## 2015-07-11 NOTE — Patient Outreach (Signed)
[facility]) Care Management  07/11/2015  Tasja Merkel November 15, 1945 [id number]   Self referral per patient: Telephone call to patient; person who answered calll advised that patient was not home but took message to have call returned.   Plan;  Will follow up. Sherrin Daisy, RN BSN [place] Management Coordinator [facility] Care Management  [phone]

## 2015-07-12 ENCOUNTER — Other Ambulatory Visit: Payer: Self-pay | Admitting: *Deleted

## 2015-07-12 NOTE — Patient Outreach (Signed)
[facility]) Care Management  07/12/2015  Mallory Hayes [birth year] [id number]  Telephone call to patient who was advised of [facility] care management services. Patient gave HIPPA verification.  Patient voices that major health concern is getting diabetes under control. States last "A1c" level was 7.9  at doctor's visit in Jan 2017.  States target is 7.0-7.1. States she was diagnosed with condition in 2008 and is on oral medication. States she is using mail order and local pharmacy to get prescriptions filled. States she owns accu-check glucometer but stopped checking blood sugar levels after her mother became sick and passed away.   States she wants to learn more about proper nutrition and meal planning for her condition. Voices that last visit with nutritionist was in 2012. States her primary care doctor at [facility]  has referred her to nutritionist but she has not made appointment yet. Patient states she drives self to MD appointments. Voices that she walks 30-35 minutes daily when weather permits. Current weight 109 pounds, height-5' 1.5".     Recommendations for referral to Health Coach for disease management. Patient consents to [facility] services.  Plan:  Refer to care management assistant to refer to Health Coach for disease management for diabetes. Telephonic signing off.  Sherrin Daisy, RN BSN [place] Management Coordinator [facility] Care Management  [phone]

## 2015-07-13 ENCOUNTER — Other Ambulatory Visit: Payer: Self-pay | Admitting: Family Medicine

## 2015-07-13 DIAGNOSIS — E118 Type 2 diabetes mellitus with unspecified complications: Secondary | ICD-10-CM

## 2015-07-20 ENCOUNTER — Other Ambulatory Visit: Payer: Self-pay

## 2015-07-20 NOTE — Patient Outreach (Signed)
[facility]) Care Management  07/20/2015  Journei Foust [birth year] [id number]   Telephone call to patient for initial health coach assessment.  No answer.  States voicemail not setup.    Plan: RN Health Coach will contact patient within 1-2 weeks.  [name], RN, MSN [place] [phone]

## 2015-07-22 ENCOUNTER — Other Ambulatory Visit: Payer: Self-pay

## 2015-07-22 NOTE — Patient Outreach (Signed)
[facility]) Care Management  07/22/2015  Mallory Hayes 1945/07/11 [id number]   Telephone call to patient for initial assessment attempt. Patient states she is not at home at the present and cannot talk.  Advised patient that health coach would call another time.  Patient in agreement.  Plan: RN Health Coach will attempt within 1-2 weeks.   [name], RN, MSN [place] [phone]

## 2015-07-25 ENCOUNTER — Ambulatory Visit: Payer: Self-pay

## 2015-07-26 ENCOUNTER — Ambulatory Visit: Payer: Self-pay

## 2015-07-28 ENCOUNTER — Ambulatory Visit: Payer: Self-pay

## 2015-07-28 ENCOUNTER — Other Ambulatory Visit: Payer: Self-pay

## 2015-07-28 NOTE — Patient Outreach (Signed)
[facility]) Care Management  07/28/2015  Mallory Hayes 1946/02/27 [id number]   Telephone call to patient for initial assessment.  Patient states she is unable to talk right now. Appointment rescheduled for 3-31.    Plan: RN Health Coach will contact patient on 3-31 as requested.    [name], RN, MSN [place] [phone]

## 2015-07-29 ENCOUNTER — Other Ambulatory Visit: Payer: Self-pay

## 2015-07-29 NOTE — Patient Outreach (Signed)
[facility]) Care Management  [place]  07/29/2015   Mallory Hayes 1945-05-20 [id number]  Subjective: Telephone call to patient for initial assessment.  Patient reports she is doing good but knows she can improve her diabetes. Patient reports she does not check her sugars as she should due to worrying about the numbers.  Discussed with patient the importance and reasons why checking her sugar is important. Also discussed with patient carbohydrates and how they affect blood sugar. She verbalized understanding.  Patient reports that she is to see a nutritionist pertaining to her not checking her sugar and is positive about seeing the nutritionist in helping with checking her blood sugars.    Objective:   Current Medications:  Current Outpatient Prescriptions  Medication Sig Dispense Refill  . ACCU-CHEK FASTCLIX LANCETS MISC Check blood sugar daily 102 each 3  . aspirin 81 MG tablet Take 81 mg by mouth daily.      . Blood Glucose Monitoring Suppl (BLOOD GLUCOSE METER) kit Use as instructed 1 each 0  . Blood Pressure Monitor DEVI Check blood pressure 3x weekly. 1 Device 0  . glucose blood (ACCU-CHEK SMARTVIEW) test strip Use as instructed 100 each 3  . hydrocortisone cream 0.5 % Apply 1 application topically 2 (two) times daily as needed for itching. 30 g 0  . Lancet Device MISC Check CBG once daily in AM 100 each 11  . lisinopril-hydrochlorothiazide (PRINZIDE,ZESTORETIC) 20-12.5 MG tablet TAKE 2 TABLETS BY MOUTH DAILY. 180 tablet 2  . metFORMIN (GLUCOPHAGE-XR) 500 MG 24 hr tablet TAKE 2 TABLETS EVERY DAY  WITH  BREAKFAST 180 tablet 0  . mometasone (NASONEX) 50 MCG/ACT nasal spray Place 2 sprays into the nose daily. 17 g 3  . pantoprazole (PROTONIX) 40 MG tablet Take 1 tablet (40 mg total) by mouth daily. 90 tablet 3  . polyethylene glycol powder (GLYCOLAX/MIRALAX) powder MIX 1 CAPFUL (17GM) IN LIQUID AND DRINK EVERY DAY AS NEEDED FOR  CONSTIPATION 1530 g 2  .  rosuvastatin (CRESTOR) 20 MG tablet Take 1 tablet (20 mg total) by mouth at bedtime. 90 tablet 3  . vitamin E 200 UNIT capsule Take 200 Units by mouth daily.      . baclofen (LIORESAL) 10 MG tablet Take 0.5-1 tablets (5-10 mg total) by mouth 3 (three) times daily as needed for muscle spasms. Start half tab nightly, gradually increase dose over 1 week as advised. (Patient not taking: Reported on 07/29/2015) 30 each 0  . calcium citrate-vitamin D 500-400 MG-UNIT chewable tablet Chew 2 tablets by mouth daily. (Patient not taking: Reported on 07/29/2015) 60 tablet 11  . Tdap (BOOSTRIX) 5-2.5-18.5 LF-MCG/0.5 injection Inject 0.5 mLs into the muscle once. (Patient not taking: Reported on 07/29/2015) 0.5 mL 0   No current facility-administered medications for this visit.    Functional Status:  In your present state of health, do you have any difficulty performing the following activities: 07/29/2015  Hearing? N  Vision? N  Difficulty concentrating or making decisions? N  Walking or climbing stairs? N  Dressing or bathing? N  Doing errands, shopping? N  Preparing Food and eating ? N  Using the Toilet? N  In the past six months, have you accidently leaked urine? N  Do you have problems with loss of bowel control? N  Managing your Medications? N  Managing your Finances? N  Housekeeping or managing your Housekeeping? N    Fall/Depression Screening: PHQ 2/9 Scores 07/29/2015 07/12/2015 05/23/2015 04/06/2015 01/05/2015 12/02/2014 04/27/2014  PHQ -  2 Score 0 0 0 0 0 0 0    Assessment: Patient will benefit from health coach outreach for education on diabetes management for self-management.  Plan:  [facility] CM Care Plan Problem One        Most Recent Value   Care Plan Problem One  Diabetes knowledge deficit   Role Documenting the Problem One  Health Coach   Care Plan for Problem One  Active   THN Long Term Goal (31-90 days)  Patient will decrease A1c 0.2 points within 90 days.     THN Long Term Goal Start  Date  07/29/15   Interventions for Problem One Long Term Goal  Discussed with patient complications of diabetes and importance of maintaining blood sugar control.  Also discussed kidney function in relation to diabetes.      THN CM Short Term Goal #1 (0-30 days)  Patient will be able to report maintaining low carbohydrate diet within 30 days.    THN CM Short Term Goal #1 Start Date  07/29/15   THN CM Short Term Goal #2 (0-30 days)  Patient will report checking blood sugar at least once a week within 30 days.   THN CM Short Term Goal #2 Start Date  07/29/15   Interventions for Short Term Goal #2  [place] discussed with patient importance of checking blood sugar as a routine in diabetic care.      RN Health Coach will provide ongoing education for patient on diabetes through phone calls and sending printed information to patient for further discussion.  RN Health Coach will send welcome packet with consent to patient as well as printed information on diabetes.  RN Health Coach will send initial barriers letter, assessment, and care plan to primary care physician.  RN Health Coach will contact patient within one month and patient agrees to next contact.   [name], RN, MSN [place] [phone]

## 2015-08-18 ENCOUNTER — Ambulatory Visit (INDEPENDENT_AMBULATORY_CARE_PROVIDER_SITE_OTHER): Payer: Medicare HMO | Admitting: Family Medicine

## 2015-08-18 ENCOUNTER — Encounter: Payer: Self-pay | Admitting: Family Medicine

## 2015-08-18 VITALS — Ht 61.0 in | Wt 117.4 lb

## 2015-08-18 DIAGNOSIS — E119 Type 2 diabetes mellitus without complications: Secondary | ICD-10-CM | POA: Diagnosis not present

## 2015-08-18 NOTE — Progress Notes (Signed)
Medical Nutrition Therapy:  Appt start time: 1330 end time:  1430.  Assessment:  Primary concerns today: Blood sugar control and Meal planning.  Mallory Hayes was diagnosed with DM2 in 2008, and did see an RD and took classes at [facility], but said she would like some help in meal planning.  Today's appt did not allow getting too much into this.  She will follow up with a list of meals she likes, when we meet in a month to review these meals, and modify as necessary to fit her nutritional needs.  She  lives alone.  Does not currently check BG levels.  Is planning to come in for A1C check in May.   Lab Results  Component Value Date   HGBA1C 7.9 05/23/2015   Usually gets about 4 hours of good sleep/night, and the rest of the night she gets intermittent sleep.  Has been evaluated at the sleep center in July 2016, where she was told she has sleep apnea, but this has not been addressed in any follow-up appt.    Learning Readiness: Ready  Usual eating pattern includes 2 meals and 3 snacks per day. Frequent foods and beverages include banana, cooked oatmeal, egg, pickled beets, salad, chx, canned chx noodle soup (minus liquid; water added), f-f milk, coffee w/ cream & Splenda (2-3 c/day), ginger ale (6 oz/day).  Avoided foods include spicy foods (allergic to pepper), chili, pizza, citrus (tongue ulcers), Br sprouts..   Usual physical activity includes walking 30 min (1/2 mi) daily and gardening.    24-hr recall: (Up at 6:40 AM) B (8:30 AM)-   2 slc beets, 3 oz f-f milk, 2 tbsp dry oatmeal qith 1 tsp olive oil & Splenda, 1 c coffee, creamer, Splenda Walked 35:00 at 9 AM Snk (10 AM)-   1 fried egg, 1 c coffee, creamer, Splenda (+ Metformin)  L (1:30 PM)-  1 tossed salad, vineg&oil, 1 ham (2 slc), tom, & chs sandw, 2+ tsp mayo, 6 oz g'ale Snk ( PM)-  ---- D (3 PM)-  4 oz breaded pollock, 5 shrimp, 7 ff's, 1/2 c crmy coleslaw, 1 roll, 6 sips diluted lemonade Snk (5 PM)-  1 bag m'wave popcorn (8 g fat, 115 mg Na,  140 kcal), 1 c coffee, creamer, Splenda Snk (8 PM)-  1 Snickers bar (100 kcal)  Typical day? Yes.    Progress Towards Goal(s):  In progress.   Nutritional Diagnosis:  NB-1.1 Food and nutrition-related knowledge deficit As related to meal planning.  As evidenced by patient's request for help in this process.    Intervention:  Nutrition education.  Handouts given during visit include:  AVS  Demonstrated degree of understanding via:  Teach Back   Monitoring/Evaluation:  Dietary intake, exercise, and body weight in 4 week(s).

## 2015-08-18 NOTE — Patient Instructions (Addendum)
-   Be sure to call for your A1C and appt with Dr. Ardelia Mems in May.    Diet Recommendations for Diabetes  Carbohydrate consist of starch, sugar, and fiber.  Fiber will NOT raise blood sugar, and actually can help prevent the rise of blood sugar if consumed with starch or sugar.  SO: The best carb's contain fiber also.  When you look at nutrition labels, look for TOTAL CARB, not just sugar.   Starchy (carb) foods: Bread, rice, pasta, potatoes, corn, cereal, grits, crackers, bagels, muffins, all baked goods.  (Fruits, milk, and yogurt also have carbohydrate, but most of these foods will not spike your blood sugar as the starchy foods will.)  A few fruits do cause high blood sugars; use small portions of bananas (limit to 1/2 at a time), grapes, watermelon, oranges, and most tropical fruits.  Any fruit juice is HIGH-GLYCEMIC.   Protein foods: Meat, fish, poultry, eggs, dairy foods, and beans such as pinto and kidney beans (beans also provide carbohydrate).   1. Eat at least 3 REAL meals and 1-2 snacks per day. Never go more than 4-5 hours while awake without eating. Eat breakfast within the first hour of getting up.   - A REAL meal includes a protein food, starch food, and veg's and/or fruit.    2. Limit starchy foods to one to two per meal and ONE per snack. ONE portion of a starchy  food is equal to the following:   - ONE slice of bread (or its equivalent, such as half of a hamburger bun).   - 1/2 cup of a "scoopable" starchy food such as potatoes or rice.   - 15 grams of carbohydrate as shown on food label.  3. Include at every meal: a protein food, a carb food, and vegetables and/or fruit.   - Obtain twice the volume of veg's as protein or carbohydrate foods for both lunch and dinner.   - Fresh or frozen veg's are best.   - Keep frozen veg's on hand for a quick vegetable serving.     - YOUR HOMEWORK:  - Make a list of 7-10 meals that taste good, are relatively quick and easy to prepare, and that  meet your nutritional needs.  Use this as a basis for shopping, so you can make one of these meals any time.  Bring your list to your follow-up appointment for review.

## 2015-08-19 ENCOUNTER — Ambulatory Visit: Payer: Self-pay

## 2015-08-19 ENCOUNTER — Other Ambulatory Visit: Payer: Self-pay

## 2015-08-19 NOTE — Patient Outreach (Signed)
[place] [facility]) Care Management  08/19/2015  Kenyla Lamastus 11-21-1945 [id number]   Telephone call to patient for monthly call.  No answer.  HIPAA compliant voice message left.  Plan: RN Health Coach will contact patient within 1-2 weeks.  [name], RN, MSN [place] [phone]

## 2015-08-23 ENCOUNTER — Ambulatory Visit (INDEPENDENT_AMBULATORY_CARE_PROVIDER_SITE_OTHER): Payer: Medicare HMO | Admitting: Family Medicine

## 2015-08-23 VITALS — BP 161/67 | HR 52 | Temp 97.9°F | Wt 120.0 lb

## 2015-08-23 DIAGNOSIS — W57XXXA Bitten or stung by nonvenomous insect and other nonvenomous arthropods, initial encounter: Secondary | ICD-10-CM

## 2015-08-23 DIAGNOSIS — N3941 Urge incontinence: Secondary | ICD-10-CM | POA: Diagnosis not present

## 2015-08-23 DIAGNOSIS — E1169 Type 2 diabetes mellitus with other specified complication: Secondary | ICD-10-CM

## 2015-08-23 DIAGNOSIS — G4733 Obstructive sleep apnea (adult) (pediatric): Secondary | ICD-10-CM | POA: Diagnosis not present

## 2015-08-23 DIAGNOSIS — T148 Other injury of unspecified body region: Secondary | ICD-10-CM | POA: Diagnosis not present

## 2015-08-23 LAB — POCT GLYCOSYLATED HEMOGLOBIN (HGB A1C): Hemoglobin A1C: 7.7

## 2015-08-23 MED ORDER — DOXYCYCLINE HYCLATE 100 MG PO TABS: 100.0000 mg | ORAL_TABLET | Freq: Two times a day (BID) | ORAL | Status: DC

## 2015-08-23 NOTE — Assessment & Plan Note (Signed)
A1c checked by clinic staff today - 7.7. Patient has appointment with me in 1 month to follow up on chronic medical issues. We will discuss this then. Overall improved control.

## 2015-08-23 NOTE — Assessment & Plan Note (Signed)
Symptoms reported by patient consistent with urge incontinence. Will defer treatment until after insect bite resolved. Plan to discuss at next office visit

## 2015-08-23 NOTE — Assessment & Plan Note (Signed)
As patient has many concerns about her CPAP machine & how to use it, I will refer her for formal sleep medicine consultation so they can make adjustments as necessary.

## 2015-08-23 NOTE — Progress Notes (Signed)
Date of Visit: 08/23/2015   HPI:  Patient presents for a same day appointment to discuss bug bite.  On Friday was doing some gardening outside. Saturday began noticing itching and a bump to her R side. It's still there and is itching. Feels itchy all over. Does not know for sure that a bug bit her. Did not see a tick or insect on her skin. No fevers. Eating and drinking well. Stooling normally.  Of note patient also endorses symptoms of urge incontinence, ongoing for a while. No dysuria, stress incontinence, or nocturnal incontinence.    OSA - has CPAP machine at home. She had a sleep study done March 2016 and a CPAP titration study done July 2016. Has not been using CPAP at all as she doesn't know how to set it up. Does not think she would do well with the full mask. Prefers possibly using nasal mask.  Summary of recommendations from titration study: Trial of CPAP therapy on 6 cm H2O with a Small size Fisher&Paykel Full Face Mask Simplus mask and heated humidification. Avoid alcohol, sedatives and other CNS depressants that may worsen sleep apnea and disrupt normal sleep architecture. Sleep hygiene should be reviewed to assess factors that may improve sleep quality. Weight management and regular exercise should be initiated or continued.   ROS: See HPI  [place]: hypertension, type 2 diabetes, hyperlipidemia, depression, OSA   PHYSICAL EXAM: BP 161/67 mmHg  Pulse 52  Temp(Src) 97.9 F (36.6 C) (Oral)  Wt 120 lb (54.432 kg) Gen: no acute distress, pleasant, cooperative HEENT: normocephalic, atraumatic, moist mucous membranes  Skin: 2cm area of induration and pruritis on R flank at level of the breast. Some surrounding erythema with possible central clearing. No purulence drained with palpation. No fluctuance.   ASSESSMENT/PLAN:  Insect bite - appears to have some surrounding mild cellulitis. May have possible appearance of erythema migrans. Will treat with oral doxycycline to cover  cellulitis and tickborne illness. 100mg  twice daily for 7 days. Follow up if not improving.  Obstructive sleep apnea As patient has many concerns about her CPAP machine & how to use it, I will refer her for formal sleep medicine consultation so they can make adjustments as necessary.  Type 2 diabetes mellitus A1c checked by clinic staff today - 7.7. Patient has appointment with me in 1 month to follow up on chronic medical issues. We will discuss this then. Overall improved control.   Urge incontinence Symptoms reported by patient consistent with urge incontinence. Will defer treatment until after insect bite resolved. Plan to discuss at next office visit     FOLLOW UP: Follow up in 1 month as scheduled for chronic medical problems. Referring to sleep medicine  [place] J. Ardelia Mems, [place]

## 2015-08-23 NOTE — Patient Instructions (Addendum)
Take doxycycline 100mg  twice a day for 7 days Return if the area worsens or you have fever, etc  I am referring you to a sleep doctor for your CPAP stuff. You will get a phone call to schedule this appointment.  Please take your machine with you to that visit  We can try a medicine for your urine if you want, let's wait until next visit to talk more about that  Be well, Dr. Ardelia Mems   Insect Bite Mosquitoes, flies, fleas, bedbugs, and many other insects can bite. Insect bites are different from insect stings. A sting is when poison (venom) is injected into the skin. Insect bites can cause pain or itching for a few days, but they are usually not serious. Some insects can spread diseases to people through a bite. SYMPTOMS  Symptoms of an insect bite include:  Itching or pain in the bite area.  Redness and swelling in the bite area.  An open wound (skin ulcer). In many cases, symptoms last for 2-4 days.  DIAGNOSIS  This condition is usually diagnosed based on symptoms and a physical exam. TREATMENT  Treatment is usually not needed for an insect bite. Symptoms often go away on their own. Your health care provider may recommend creams or lotions to help reduce itching. Antibiotic medicines may be prescribed if the bite becomes infected. A tetanus shot may be given in some cases. If you develop an allergic reaction to an insect bite, your health care provider will prescribe medicines to treat the reaction (antihistamines). This is rare. HOME CARE INSTRUCTIONS  Do not scratch the bite area.  Keep the bite area clean and dry. Wash the bite area daily with soap and water as told by your health care provider.  If directed, applyice to the bite area.  Put ice in a plastic bag.  Place a towel between your skin and the bag.  Leave the ice on for 20 minutes, 2-3 times per day.  To help reduce itching and swelling, try applying a baking soda paste, cortisone cream, or calamine lotion to the  bite area as told by your health care provider.  Apply or take over-the-counter and prescription medicines only as told by your health care provider.  If you were prescribed an antibiotic medicine, use it as told by your health care provider. Do not stop using the antibiotic even if your condition improves.  Keep all follow-up visits as told by your health care provider. This is important. PREVENTION   Use insect repellent. The best insect repellents contain:  DEET, picaridin, oil of lemon eucalyptus (OLE), or IR3535.  Higher amounts of an active ingredient.  When you are outdoors, wear clothing that covers your arms and legs.  Avoid opening windows that do not have window screens. SEEK MEDICAL CARE IF:  You have increased redness, swelling, or pain in the bite area.  You have a fever. SEEK IMMEDIATE MEDICAL CARE IF:   You have joint pain.   You have fluid, blood, or pus coming from the bite area.  You have a headache or neck pain.  You have unusual weakness.  You have a rash.  You have chest pain or shortness of breath.  You have abdominal pain, nausea, or vomiting.  You feel unusually tired or sleepy.   This information is not intended to replace advice given to you by your health care provider. Make sure you discuss any questions you have with your health care provider.   Document Released: 05/24/2004  Document Revised: 01/05/2015 Document Reviewed: 09/01/2014 Elsevier Interactive Patient Education [employer].

## 2015-08-24 ENCOUNTER — Other Ambulatory Visit: Payer: Self-pay

## 2015-08-24 ENCOUNTER — Ambulatory Visit: Payer: Self-pay

## 2015-08-24 NOTE — Patient Outreach (Signed)
[place] [facility]) Care Management  08/24/2015  Mallory Hayes 06-20-1945 [id number]   2nd telephone call to patient for monthly assessment.  No answer.  HIPAA compliant voice message left.    Plan: RN Health Coach will attempt patient within 1-2 weeks.  [name], RN, MSN [place] [phone]

## 2015-08-26 ENCOUNTER — Ambulatory Visit (INDEPENDENT_AMBULATORY_CARE_PROVIDER_SITE_OTHER): Payer: Medicare HMO | Admitting: Pulmonary Disease

## 2015-08-26 ENCOUNTER — Encounter: Payer: Self-pay | Admitting: Family Medicine

## 2015-08-26 ENCOUNTER — Encounter: Payer: Self-pay | Admitting: Pulmonary Disease

## 2015-08-26 VITALS — BP 140/80 | HR 52 | Ht 61.0 in | Wt 118.0 lb

## 2015-08-26 DIAGNOSIS — G4733 Obstructive sleep apnea (adult) (pediatric): Secondary | ICD-10-CM | POA: Diagnosis not present

## 2015-08-26 NOTE — Progress Notes (Signed)
Past Medical History She  has a past medical history of Diabetes mellitus; Hypertension; GERD (gastroesophageal reflux disease); Urinary incontinence; and Sleep apnea.  Past Surgical History She  has past surgical history that includes Appendectomy; Cholecystectomy; Colonoscopy (2011); and Partial hysterectomy (1991).  Current Outpatient Prescriptions on File Prior to Visit  Medication Sig  . aspirin 81 MG tablet Take 81 mg by mouth daily.    . calcium citrate-vitamin D 500-400 MG-UNIT chewable tablet Chew 2 tablets by mouth daily.  Marland Kitchen doxycycline (VIBRA-TABS) 100 MG tablet Take 1 tablet (100 mg total) by mouth 2 (two) times daily.  . hydrocortisone cream 0.5 % Apply 1 application topically 2 (two) times daily as needed for itching.  Marland Kitchen lisinopril-hydrochlorothiazide (PRINZIDE,ZESTORETIC) 20-12.5 MG tablet TAKE 2 TABLETS BY MOUTH DAILY.  . metFORMIN (GLUCOPHAGE-XR) 500 MG 24 hr tablet TAKE 2 TABLETS EVERY DAY  WITH  BREAKFAST  . mometasone (NASONEX) 50 MCG/ACT nasal spray Place 2 sprays into the nose daily.  . pantoprazole (PROTONIX) 40 MG tablet Take 1 tablet (40 mg total) by mouth daily.  . polyethylene glycol powder (GLYCOLAX/MIRALAX) powder MIX 1 CAPFUL (17GM) IN LIQUID AND DRINK EVERY DAY AS NEEDED FOR  CONSTIPATION  . rosuvastatin (CRESTOR) 20 MG tablet Take 1 tablet (20 mg total) by mouth at bedtime.  . vitamin E 200 UNIT capsule Take 200 Units by mouth daily.     No current facility-administered medications on file prior to visit.    Allergies  Allergen Reactions  . Amaryl [Glimepiride] Anxiety  . Food Itching    Spices, peppers  . Sulfamethoxazole Itching and Rash    REACTION: sneezing    Family History Her family history includes Cancer in her mother; Colon cancer (age of onset: 3) in her mother; Diabetes (age of onset: 90) in her mother; Heart disease (age of onset: 25) in her mother; Hypertension in her mother; Ovarian cancer in her mother.  Social History She  reports  that she has never smoked. She has never used smokeless tobacco. She reports that she does not drink alcohol or use illicit drugs.  Review of systems Constitutional: Negative for fever and unexpected weight change.  HENT: Negative for congestion, dental problem, ear pain, nosebleeds, postnasal drip, rhinorrhea, sinus pressure, sneezing, sore throat and trouble swallowing.   Eyes: Negative for redness and itching.  Respiratory: Negative for cough, chest tightness, shortness of breath and wheezing.   Cardiovascular: Negative for palpitations and leg swelling.  Gastrointestinal: Negative for nausea and vomiting.  Genitourinary: Negative for dysuria.  Musculoskeletal: Negative for joint swelling.  Skin: Negative for rash.       Itching  Neurological: Negative for headaches.  Hematological: Does not bruise/bleed easily.  Psychiatric/Behavioral: Negative for dysphoric mood. The patient is not nervous/anxious.     Chief Complaint  Patient presents with  . Sleep Consult    Referred by Chrisandra Netters for OSA. Epworth: 9.    Tests: PSG 07/14/14 >> AHI 6.2  Echo 06/08/15 >> EF 60%  Vital signs BP 140/80 mmHg  Pulse 52  Ht 5\' 1"  (1.549 m)  Wt 118 lb (53.524 kg)  BMI 22.31 kg/m2  SpO2 100%  History of Present Illness Braylen Dudas is a 70 y.o. female for evaluation of sleep problems.  She had a sleep study in 2016.  She was found to have mild sleep apnea.  She had a CPAP titration study and did well with CPAP 6 cm H2O.  She was never set up with CPAP >> not clear  why not.  She has trouble staying asleep.  Her mother has told her she snores.  She will wake up with a choking feeling.  Her mouth gets dry at night.  She goes to sleep at 6 pm.  She falls asleep after watching TV for a few hours.  She wakes up 3 to 4 times to use the bathroom.  She gets out of bed at 5 am.  She feels okay in the morning.  She denies morning headache.  She does not use anything to help her fall sleep or stay  awake.  She will fall asleep while watching TV or reading.  She denies sleep walking, sleep talking, bruxism, or nightmares.  There is no history of restless legs.  She denies sleep hallucinations, sleep paralysis, or cataplexy.  The Epworth score is 9 out of 24.   Physical Exam:  General - No distress ENT - No sinus tenderness, no oral exudate, no LAN, no thyromegaly, TM clear, pupils equal/reactive, MP 4, enlarged tongue, slight over bite Cardiac - s1s2 regular, no murmur, pulses symmetric Chest - No wheeze/rales/dullness, good air entry, normal respiratory excursion Back - No focal tenderness Abd - Soft, non-tender, no organomegaly, + bowel sounds Ext - No edema Neuro - Normal strength, cranial nerves intact Skin - No rashes Psych - Normal mood, and behavior  Discussion: She has mild obstructive sleep apnea.  She has hx of HTN and DM.  We discussed how sleep apnea can affect various health problems, including risks for hypertension, cardiovascular disease, and diabetes.  We also discussed how sleep disruption can increase risks for accidents, such as while driving.  Weight loss as a means of improving sleep apnea was also reviewed.  Additional treatment options discussed were CPAP therapy, oral appliance, and surgical intervention.   Assessment/plan:  Obstructive sleep apnea. - will arrange for CPAP at 6 cm H2O - discussed CPAP mask options, and how to adjust to using CPAP at night - if CPAP is unsuccessful, then she might be candidate for oral appliance   Patient Instructions  Will arrange for CPAP set up  Follow up in 2 months after CPAP set up with Dr. Halford Chessman or Nurse practitioner     Chesley Mires, M.D. Pager [phone] 08/26/2015, 3:50 PM

## 2015-08-26 NOTE — Progress Notes (Signed)
   Subjective:    Patient ID: Mallory Hayes, female    DOB: [birth year], 70 y.o.   MRN: [id number]  HPI    Review of Systems  Constitutional: Negative for fever and unexpected weight change.  HENT: Negative for congestion, dental problem, ear pain, nosebleeds, postnasal drip, rhinorrhea, sinus pressure, sneezing, sore throat and trouble swallowing.   Eyes: Negative for redness and itching.  Respiratory: Negative for cough, chest tightness, shortness of breath and wheezing.   Cardiovascular: Negative for palpitations and leg swelling.  Gastrointestinal: Negative for nausea and vomiting.  Genitourinary: Negative for dysuria.  Musculoskeletal: Negative for joint swelling.  Skin: Negative for rash.       Itching  Neurological: Negative for headaches.  Hematological: Does not bruise/bleed easily.  Psychiatric/Behavioral: Negative for dysphoric mood. The patient is not nervous/anxious.        Objective:   Physical Exam        Assessment & Plan:

## 2015-08-26 NOTE — Patient Instructions (Signed)
Will arrange for CPAP set up  Follow up in 2 months after CPAP set up with Dr. Halford Chessman or Nurse practitioner

## 2015-08-30 ENCOUNTER — Telehealth: Payer: Self-pay | Admitting: Pulmonary Disease

## 2015-08-30 DIAGNOSIS — G4733 Obstructive sleep apnea (adult) (pediatric): Secondary | ICD-10-CM

## 2015-08-30 NOTE — Telephone Encounter (Signed)
Left message for Melissa to call back.  

## 2015-08-30 NOTE — Telephone Encounter (Signed)
Per [facility] pt's CPAP titration study is too old. Study would have to be within 6 months of CPAP order.   VS please advise if new titrations study should be ordered. Thank you!

## 2015-08-30 NOTE — Telephone Encounter (Signed)
Mallory Hayes returned call re CPAP order.  Her titration was done 10/2014 and CPAP order is dated 07/2015. She has [employer] and they require order to be within 6 mos of sleep study. Lenna Sciara will be out tomorrow, so may call Corene Cornea at [phone] ext [phone].

## 2015-08-31 NOTE — Telephone Encounter (Signed)
Please inform pt that her insurance requires that sleep study can't be more than 6 months old when ordering new CPAP setup.  Please schedule home sleep study if this allowed by her insurance.  If her insurance won't cover home sleep study, then please schedule NPSG.

## 2015-08-31 NOTE — Telephone Encounter (Signed)
LMTC x 1 for pt. [phone])

## 2015-09-02 ENCOUNTER — Ambulatory Visit: Payer: Self-pay

## 2015-09-02 ENCOUNTER — Other Ambulatory Visit: Payer: Self-pay

## 2015-09-02 NOTE — Patient Outreach (Signed)
[facility]) Care Management  09/02/2015  [name] [birth year] [id number]   3rd telephone call to patient for monthly outreach. Female answers stating patient not in. Health Coach contact information given for return call.   Plan: RN Health Coach will send letter to attempt outreach.  If no answer in the next 10 business days, will proceed with case closure.    [name], RN, MSN [place] [phone]

## 2015-09-02 NOTE — Telephone Encounter (Signed)
Spoke with pt. She is aware that she will need to repeat her sleep study. Order will be placed for home sleep test. Nothing further was needed.

## 2015-09-14 ENCOUNTER — Other Ambulatory Visit: Payer: Self-pay | Admitting: Family Medicine

## 2015-09-14 MED ORDER — METFORMIN HCL ER 500 MG PO TB24: ORAL_TABLET | ORAL | Status: DC

## 2015-09-14 NOTE — Telephone Encounter (Signed)
Patient asks refill for metformin 500 mg ASAP. Please, follow up with Patient.

## 2015-09-15 ENCOUNTER — Other Ambulatory Visit: Payer: Self-pay

## 2015-09-15 ENCOUNTER — Encounter: Payer: Self-pay | Admitting: Family Medicine

## 2015-09-15 ENCOUNTER — Ambulatory Visit (INDEPENDENT_AMBULATORY_CARE_PROVIDER_SITE_OTHER): Payer: Medicare HMO | Admitting: Family Medicine

## 2015-09-15 VITALS — Ht 61.0 in | Wt 114.4 lb

## 2015-09-15 DIAGNOSIS — E119 Type 2 diabetes mellitus without complications: Secondary | ICD-10-CM

## 2015-09-15 NOTE — Patient Outreach (Signed)
[facility]) Care Management  09/15/2015  Mallory Hayes 1946-01-15 [id number]   Return telephone call to patient.  Patient states she cannot talk right now as she has an appointment.  Patient states she will call back tomorrow morning.  Plan: RN Health Coach will wait patient return call.    [name], RN, MSN [place] [phone]

## 2015-09-15 NOTE — Progress Notes (Signed)
Medical Nutrition Therapy:  Appt start time: 1330 end time:  1430.  Assessment:  Primary concerns today: Blood sugar control and Meal planning.  Ms. Turrill' weight was down almost 3 lb in the past month, which is explained by her feeling bad from the recent insect bite and antibiotics.  She finished all but one tablet of the doxycycline course, which she did not take b/c she'd figured out that the med actually caused itchiness all over; wonders if she was having an adverse reaction to it.  Just started feeling better this week.    Ms. Berke did not remember specific goals, nor did she seem to understand the recommendation to devise a list of meals to review at today's visit.  We reviewed DM diet goals again, and how to determine what ONE carb portion is equal to.    Lab Results  Component Value Date   HGBA1C 7.7 08/23/2015   HGBA1C 7.9 05/23/2015   HGBA1C 7.4 12/02/2014   24-hr recall:  (Up at 5:30 AM) B (6 AM)-  1 c coffee, Splenda, 1 T cream, 6 oz f-f milk, 3 slc banana, 2 T dry oatmeal, 1 tsp olive oil, 1 T cream, Splenda, sprinkle of sesame seeds, 1 egg fried in olive oil, water Snk (11 AM)-  3 T applesauce L (1:30 PM)-  [employer]: 1 hambrgr, 1 bun, pickles, mayo, baked potato, 1 T butter, 1/2 c fried okra, water Snk (4:30)-  3 oz gingerale D (7 PM)-  1 boiled egg, mayo, garlic salt, water Snk ( PM)-  --- Typical day? Yes.  Has a bedtime snack of 1 cookie or small Snickers bar ~5 X wk.  Progress Towards Goal(s):  In progress.   Nutritional Diagnosis:  NB-1.1 Food and nutrition-related knowledge deficit As related to meal planning.  As evidenced by patient's request for help in this process.    Intervention:  Nutrition education.   Handouts given during visit include:  AVS  Demonstrated degree of understanding via:  Teach Back   Monitoring/Evaluation:  Dietary intake, exercise, and body weight in 5 week(s).

## 2015-09-15 NOTE — Patient Instructions (Addendum)
  Diet Recommendations for Diabetes   Starchy (carb) foods: Bread, rice, pasta, potatoes, corn, cereal, grits, crackers, bagels, muffins, all baked goods.  (Fruits, milk, and yogurt also have carbohydrate, but most of these foods will not spike your blood sugar as the starchy foods will.)  A few fruits do cause high blood sugars; use small portions of bananas (limit to 1/2 at a time), grapes, watermelon, oranges, and most tropical fruits.    Protein foods: Meat, fish, poultry, eggs, dairy foods, and beans such as pinto and kidney beans (beans also provide carbohydrate).   1. Eat at least REAL 3 meals and 1-2 snacks per day. Never go more than 4-5 hours while awake without eating. Eat breakfast within the first hour of getting up.    A REAL meal includes protein, starch, and vegetables and/or fruit.    Remember that when you eat vegetables, you are eating fiber.  This helps to slow the rise in blood sugar, so veg's for this (and many other) reason, are important to include regularly.   2. Limit starchy foods to TWO per meal and ONE per snack. ONE portion of a starchy  food is equal to the following:   - ONE slice of bread (or its equivalent, such as half of a hamburger bun).   - 1/2 cup of a "scoopable" starchy food such as potatoes or rice.   - 15 grams of carbohydrate as shown on food label.  3. Include at every meal: a protein food, a carb food, and vegetables and/or fruit.   - Obtain twice the volume of veg's as protein or carbohydrate foods for both lunch and dinner.   - Fresh or frozen veg's are best.   - Keep frozen veg's on hand for a quick vegetable serving.      - Add to the list of meals started today, and bring back to follow-up for review.    - Make an appt for Pharmacy to review use of your glucometer, and start checking just fasting blood sugars at a consistent time each day.  WRITE DOWN YOUR BLOOD SUGARS ON THE FORM PROVIDED TODAY.  Bring your blood glucose log to follow-up.

## 2015-09-20 ENCOUNTER — Other Ambulatory Visit: Payer: Self-pay

## 2015-09-20 ENCOUNTER — Ambulatory Visit: Payer: Medicare HMO | Admitting: Podiatry

## 2015-09-20 NOTE — Patient Outreach (Signed)
[facility]) Care Management  09/20/2015  Riata Sayarath 13-Nov-1945 [id number]   Patient has not engaged after several attempts and letter.  Will proceed with case closure.    Plan: RN Health Coach will send physician a closure letter.  RN Health Coach will notify Care management assistant to close case.    [name], RN, MSN [place] [phone]

## 2015-09-21 ENCOUNTER — Ambulatory Visit: Payer: Medicare HMO | Admitting: Family Medicine

## 2015-09-28 ENCOUNTER — Ambulatory Visit (INDEPENDENT_AMBULATORY_CARE_PROVIDER_SITE_OTHER): Payer: Medicare HMO | Admitting: Podiatry

## 2015-09-28 ENCOUNTER — Encounter: Payer: Self-pay | Admitting: Podiatry

## 2015-09-28 DIAGNOSIS — M79674 Pain in right toe(s): Secondary | ICD-10-CM | POA: Diagnosis not present

## 2015-09-28 DIAGNOSIS — M79675 Pain in left toe(s): Secondary | ICD-10-CM | POA: Diagnosis not present

## 2015-09-28 DIAGNOSIS — B351 Tinea unguium: Secondary | ICD-10-CM

## 2015-09-28 NOTE — Patient Instructions (Signed)
Diabetes and Foot Care Diabetes may cause you to have problems because of poor blood supply (circulation) to your feet and legs. This may cause the skin on your feet to become thinner, break easier, and heal more slowly. Your skin may become dry, and the skin may peel and crack. You may also have nerve damage in your legs and feet causing decreased feeling in them. You may not notice minor injuries to your feet that could lead to infections or more serious problems. Taking care of your feet is one of the most important things you can do for yourself.  HOME CARE INSTRUCTIONS  Wear shoes at all times, even in the house. Do not go barefoot. Bare feet are easily injured.  Check your feet daily for blisters, cuts, and redness. If you cannot see the bottom of your feet, use a mirror or ask someone for help.  Wash your feet with warm water (do not use hot water) and mild soap. Then pat your feet and the areas between your toes until they are completely dry. Do not soak your feet as this can dry your skin.  Apply a moisturizing lotion or petroleum jelly (that does not contain alcohol and is unscented) to the skin on your feet and to dry, brittle toenails. Do not apply lotion between your toes.  Trim your toenails straight across. Do not dig under them or around the cuticle. File the edges of your nails with an emery board or nail file.  Do not cut corns or calluses or try to remove them with medicine.  Wear clean socks or stockings every day. Make sure they are not too tight. Do not wear knee-high stockings since they may decrease blood flow to your legs.  Wear shoes that fit properly and have enough cushioning. To break in new shoes, wear them for just a few hours a day. This prevents you from injuring your feet. Always look in your shoes before you put them on to be sure there are no objects inside.  Do not cross your legs. This may decrease the blood flow to your feet.  If you find a minor scrape,  cut, or break in the skin on your feet, keep it and the skin around it clean and dry. These areas may be cleansed with mild soap and water. Do not cleanse the area with peroxide, alcohol, or iodine.  When you remove an adhesive bandage, be sure not to damage the skin around it.  If you have a wound, look at it several times a day to make sure it is healing.  Do not use heating pads or hot water bottles. They may burn your skin. If you have lost feeling in your feet or legs, you may not know it is happening until it is too late.  Make sure your health care provider performs a complete foot exam at least annually or more often if you have foot problems. Report any cuts, sores, or bruises to your health care provider immediately. SEEK MEDICAL CARE IF:   You have an injury that is not healing.  You have cuts or breaks in the skin.  You have an ingrown nail.  You notice redness on your legs or feet.  You feel burning or tingling in your legs or feet.  You have pain or cramps in your legs and feet.  Your legs or feet are numb.  Your feet always feel cold. SEEK IMMEDIATE MEDICAL CARE IF:   There is increasing redness,   swelling, or pain in or around a wound.  There is a red line that goes up your leg.  Pus is coming from a wound.  You develop a fever or as directed by your health care provider.  You notice a bad smell coming from an ulcer or wound.   This information is not intended to replace advice given to you by your health care provider. Make sure you discuss any questions you have with your health care provider.   Document Released: 04/13/2000 Document Revised: 12/17/2012 Document Reviewed: 09/23/2012 Elsevier Interactive Patient Education 2016 Elsevier Inc.  

## 2015-09-29 ENCOUNTER — Encounter: Payer: Self-pay | Admitting: Family Medicine

## 2015-09-29 ENCOUNTER — Ambulatory Visit (INDEPENDENT_AMBULATORY_CARE_PROVIDER_SITE_OTHER): Payer: Medicare HMO | Admitting: Family Medicine

## 2015-09-29 VITALS — BP 146/94 | HR 52 | Temp 98.1°F | Ht 61.0 in | Wt 116.4 lb

## 2015-09-29 DIAGNOSIS — N3946 Mixed incontinence: Secondary | ICD-10-CM

## 2015-09-29 DIAGNOSIS — K5909 Other constipation: Secondary | ICD-10-CM

## 2015-09-29 DIAGNOSIS — E1169 Type 2 diabetes mellitus with other specified complication: Secondary | ICD-10-CM | POA: Diagnosis not present

## 2015-09-29 DIAGNOSIS — I10 Essential (primary) hypertension: Secondary | ICD-10-CM

## 2015-09-29 DIAGNOSIS — Z1159 Encounter for screening for other viral diseases: Secondary | ICD-10-CM

## 2015-09-29 NOTE — Progress Notes (Signed)
Patient ID: Mallory Hayes, female   DOB: 1946-02-26, 70 y.o.   MRN: [id number]  Subjective: This patient presents for a scheduled visit complaining of thickened and elongated toenails and walking wearing shoes and requests toenail debridement. She has a diabetic without any history of foot complications  Chart review dated 06/14/2015 Dr. Prudence Davidson Vascular status palpable pulses Sensation normal tactile sensation bilaterally Nail exam thickened disfigured nails Ulcer exam no evidence of ulcer Orthopedic exam muscle tone sent within normal limits  Exam today No open skin lesions bilaterally HAV left Bunionette's bilaterally The toenails are elongated, brittle, deformity tender to direct palpation 6-10  Assessment: Diabetic without foot complications Symptomatic onychomycoses 6-10  Plan: Debridement toenails 6-10 mechanically and electrically without a bleeding  Reappoint 3 months

## 2015-09-29 NOTE — Progress Notes (Signed)
Date of Visit: 09/29/2015   HPI:  Patient presents for follow up.  Constipation - uses miralax as needed, well controlled though sometimes wishes it would work faster. UTD on colonoscopy (next due in Oct 2020).  Bug bites - still has a few bug bites on her sides. nowhere else. They itch some. Took all of the doxycycline except one pill, reports had lots of itching with that medication.   Frequent urination - has urinary incontinence, unable to make it to bathroom in time. No dysuria, fevers, back pain. Previously took detrol 4mg  daily with improvement in her symptoms   Hypertension - taking one pill of the lisinopril-HCTZ 20-12.5mg  daily. Previously had been on 2, but blood pressure lowered so we reduced it to 1. No chest pain, shortness of breath, swelling.   Health maintenance: -Thinks last eye appointment was in October. She will have eye doctor send [place] records. -Has Tdap scheduled at health department later this month.  ROS: See HPI.  [place]: history of type 2 diabetes, hyperlipidemia, hypertension, GERD, constipation  PHYSICAL EXAM: BP 161/62 mmHg  Pulse 52  Temp(Src) 98.1 F (36.7 C) (Oral)  Ht 5\' 1"  (1.549 m)  Wt 116 lb 6.4 oz (52.799 kg)  BMI 22.01 kg/m2 Gen: NAD, pleasant, cooperative HEENT: normocephalic, atraumatic, mmm Heart:  Regular rate and rhythm, no murmur Lungs: clear to auscultation bilaterally, normal work of breathing  Neuro: alert grossly nonfocal speech normal Ext: No appreciable lower extremity edema bilaterally  Skin: 2-3 insect bites on R and L flank with mild erythema but no fluctuance, significant induration, or skin breakdown  ASSESSMENT/PLAN:  Health maintenance:  -plan for hep C antibody testing at next blood draw -has appointment for tdap scheduled this month at health dept  Insect bites - do not appear infected. Continue topical steroid cream as needed for itching  CONSTIPATION, CHRONIC Stable, continue miralax as needed. Advised she can  try over the counter enema if she needs quicker results.  Type 2 diabetes mellitus Discussed A1c with patient today. Advised would like slightly better control if possible, but overall am pleased with results. She will have eye doc send [place] records of eye exam.  Follow up in 3 months.  HYPERTENSION, BENIGN SYSTEMIC Uncontrolled. Increase back up to 2 pills daily of the lisin-HCTZ 20-12.5mg . Follow up in 1 week for nurse blood pressure check & lab visit.  SYMPTOM, INCONTINENCE, MIXED, URGE/STRESS Refill detrol, follow up if not improved on this medication    FOLLOW UP: Follow up in 1 week for lab visit & RN blood pressure check  Mallory Hayes, [place]

## 2015-09-29 NOTE — Patient Instructions (Addendum)
Have your eye doctor send [place] records of your eye exam  Sent in detrol for you  Diabetes looks good  The bug bites don't look infected. Use topical hydrocortisone  For constipation, can try an over the counter enema if need relief quicker  Go back up to 2 pills of the lisinopril-HCTZ. Return in 1 week for a lab visit to check your kidney function on this medication. I'll check for hep C at that time too (standard test).  Be well, Dr. Ardelia Mems

## 2015-09-30 ENCOUNTER — Ambulatory Visit: Payer: Medicare HMO | Admitting: Podiatry

## 2015-10-03 ENCOUNTER — Other Ambulatory Visit: Payer: Self-pay | Admitting: *Deleted

## 2015-10-03 DIAGNOSIS — E7800 Pure hypercholesterolemia, unspecified: Secondary | ICD-10-CM

## 2015-10-03 MED ORDER — TOLTERODINE TARTRATE ER 4 MG PO CP24: 4.0000 mg | ORAL_CAPSULE | Freq: Every day | ORAL | Status: DC

## 2015-10-03 NOTE — Assessment & Plan Note (Signed)
Uncontrolled. Increase back up to 2 pills daily of the lisin-HCTZ 20-12.5mg . Follow up in 1 week for nurse blood pressure check & lab visit.

## 2015-10-03 NOTE — Assessment & Plan Note (Signed)
Refill detrol, follow up if not improved on this medication

## 2015-10-03 NOTE — Assessment & Plan Note (Signed)
Discussed A1c with patient today. Advised would like slightly better control if possible, but overall am pleased with results. She will have eye doc send [place] records of eye exam.  Follow up in 3 months.

## 2015-10-03 NOTE — Assessment & Plan Note (Signed)
Stable, continue miralax as needed. Advised she can try over the counter enema if she needs quicker results.

## 2015-10-04 ENCOUNTER — Other Ambulatory Visit: Payer: Medicare HMO

## 2015-10-04 ENCOUNTER — Ambulatory Visit (HOSPITAL_COMMUNITY)
Admission: RE | Admit: 2015-10-04 | Discharge: 2015-10-04 | Disposition: A | Discharge status: Home / Self Care | Payer: Medicare HMO | Source: Ambulatory Visit | Attending: Family Medicine | Admitting: Family Medicine

## 2015-10-04 ENCOUNTER — Encounter: Payer: Self-pay | Admitting: Family Medicine

## 2015-10-04 ENCOUNTER — Ambulatory Visit (INDEPENDENT_AMBULATORY_CARE_PROVIDER_SITE_OTHER): Payer: Medicare HMO | Admitting: Family Medicine

## 2015-10-04 VITALS — BP 184/88 | HR 53 | Temp 98.5°F | Wt 113.4 lb

## 2015-10-04 DIAGNOSIS — R9431 Abnormal electrocardiogram [ECG] [EKG]: Secondary | ICD-10-CM | POA: Diagnosis not present

## 2015-10-04 DIAGNOSIS — R000 Tachycardia, unspecified: Secondary | ICD-10-CM

## 2015-10-04 DIAGNOSIS — R5383 Other fatigue: Secondary | ICD-10-CM | POA: Diagnosis not present

## 2015-10-04 DIAGNOSIS — R001 Bradycardia, unspecified: Secondary | ICD-10-CM | POA: Diagnosis not present

## 2015-10-04 DIAGNOSIS — Z1159 Encounter for screening for other viral diseases: Secondary | ICD-10-CM

## 2015-10-04 LAB — COMPLETE METABOLIC PANEL WITH GFR
ALBUMIN: 4.4 g/dL (ref 3.6–5.1)
ALK PHOS: 72 U/L (ref 33–130)
ALT: 17 U/L (ref 6–29)
AST: 22 U/L (ref 10–35)
BUN: 16 mg/dL (ref 7–25)
CHLORIDE: 99 mmol/L (ref 98–110)
CO2: 26 mmol/L (ref 20–31)
Calcium: 10.2 mg/dL (ref 8.6–10.4)
Creat: 1.06 mg/dL — ABNORMAL HIGH (ref 0.50–0.99)
GFR, EST NON AFRICAN AMERICAN: 54 mL/min — AB (ref >=60)
GFR, Est African American: 62 mL/min (ref >=60)
GLUCOSE: 131 mg/dL — AB (ref 65–99)
POTASSIUM: 3.4 mmol/L — AB (ref 3.5–5.3)
SODIUM: 136 mmol/L (ref 135–146)
Total Bilirubin: 0.4 mg/dL (ref 0.2–1.2)
Total Protein: 7.6 g/dL (ref 6.1–8.1)

## 2015-10-04 LAB — CBC
HEMATOCRIT: 37.7 % (ref 35.0–45.0)
HEMOGLOBIN: 12.6 g/dL (ref 11.7–15.5)
MCH: 30.1 pg (ref 27.0–33.0)
MCHC: 33.4 g/dL (ref 32.0–36.0)
MCV: 90.2 fL (ref 80.0–100.0)
MPV: 11.9 fL (ref 7.5–12.5)
Platelets: 174 10*3/uL (ref 140–400)
RBC: 4.18 MIL/uL (ref 3.80–5.10)
RDW: 13.6 % (ref 11.0–15.0)
WBC: 5 10*3/uL (ref 3.8–10.8)

## 2015-10-04 LAB — TSH: TSH: 1.71 m[IU]/L

## 2015-10-04 MED ORDER — ROSUVASTATIN CALCIUM 20 MG PO TABS: 20.0000 mg | ORAL_TABLET | Freq: Every day | ORAL | Status: DC

## 2015-10-04 NOTE — Progress Notes (Signed)
Date of Visit: 10/04/2015   HPI:  Patient presents for a same day walk-in appointment to discuss weakness.  She reports that last night she had an episode of feeling like her heart was racing. She layed down and it got better. Denies chest pain or shortness of breath at that time or now. Now feels fatigued and low on energy. She thinks that her heart was beating in a regular rhythm, just felt fast. She felt kind of panicky during that time and wonders if it was a panic attack.   Hypertension - last visit I had asked her to increase lisinopril-HCTZ to 2 pills daily since blood pressure was elevated. She reports she's only tolerated taking on epill. Felt weak/drained with two pills. Did not take two pills for the last couple of days. Has not taken any blood pressure medications yet today.   ROS: See HPI  [place]: history of type 2 diabetes, hyperlipidemia, hypertension, GERD, constipation, anxiety  PHYSICAL EXAM: BP 170/80 mmHg  Pulse 53  Temp(Src) 98.5 F (36.9 C) (Oral)  Wt 113 lb 6.4 oz (51.438 kg) Gen: NAD, pleasant, cooperative HEENT: normocephalic, atraumatic, moist mucous membranes  Heart: regular rate and rhythm, no murmur Lungs: clear to auscultation bilaterally, normal work of breathing  Abdomen: soft nontender to palpation  Neuro: alert, grossly nonfocal, speech normal Extremities: No appreciable lower extremity edema bilaterally   ASSESSMENT/PLAN:  1. Fast heartbeat - EKG today actually shows sinus bradycardia, which has been observed in patient previously. Patient has history of prior issues with anxiety, suspect episode may have been a panic attack. We will continue to observe this. Will check labs to ensure no organic process: CMET, CBC, TSH. Patient agreeable to this plan  2. Hypertension - blood pressure up today but hasn't yet taken blood pressure medications. Will have her continue on just the one lisinopril-HCTZ pill. Follow up later this week for pulse check & blood  pressure check.  FOLLOW UP: Follow up later this week for blood pressure & pulse check  [place] J. Ardelia Mems, [place]

## 2015-10-04 NOTE — Patient Instructions (Signed)
Resume the 1 pill of lisinopril-HCTZ daily  Checking labwork today  Follow up later this week for a blood pressure and pulse check  Be well, Dr. Ardelia Mems

## 2015-10-05 LAB — HEPATITIS C ANTIBODY: HCV AB: NEGATIVE

## 2015-10-06 ENCOUNTER — Other Ambulatory Visit: Payer: Medicare HMO

## 2015-10-06 ENCOUNTER — Encounter: Payer: Self-pay | Admitting: Pharmacist

## 2015-10-06 ENCOUNTER — Ambulatory Visit (INDEPENDENT_AMBULATORY_CARE_PROVIDER_SITE_OTHER): Payer: Medicare HMO | Admitting: Pharmacist

## 2015-10-06 VITALS — BP 146/59 | HR 58 | Wt 114.2 lb

## 2015-10-06 DIAGNOSIS — N3941 Urge incontinence: Secondary | ICD-10-CM

## 2015-10-06 DIAGNOSIS — I10 Essential (primary) hypertension: Secondary | ICD-10-CM | POA: Diagnosis not present

## 2015-10-06 DIAGNOSIS — E1169 Type 2 diabetes mellitus with other specified complication: Secondary | ICD-10-CM | POA: Diagnosis not present

## 2015-10-06 MED ORDER — AMLODIPINE BESYLATE 2.5 MG PO TABS: 2.5000 mg | ORAL_TABLET | Freq: Every day | ORAL | Status: DC

## 2015-10-06 MED ORDER — LISINOPRIL 20 MG PO TABS: 20.0000 mg | ORAL_TABLET | Freq: Every day | ORAL | Status: DC

## 2015-10-06 NOTE — Assessment & Plan Note (Signed)
Uncontrolled urinary urgency/incontinence: Discontinue HCTZ. Follow up if occurrence has decreased at follow up visit.

## 2015-10-06 NOTE — Progress Notes (Signed)
Patient ID: Mallory Hayes, female   DOB: 1945/12/05, 70 y.o.   MRN: [id number] Reviewed: Agree with Dr. Graylin Shiver documentation and management.

## 2015-10-06 NOTE — Progress Notes (Signed)
   S:    Patient arrives in good spirits, ambulating without assistance.    Presents to the clinic for hypertension evaluation. Patient was last seen by Primary Care Provider on 10/04/15. The patient presents with her new BG meter as well.  Patient reports adherence with medications.  Current BP Medications include:  Lisinopril-HCTZ 20-12.5 mg daily  Antihypertensives tried in the past include: amlodipine 5 mg (reports dizziness, fatigue with it), fosinopril  The patient denies headaches or dizziness. She reports urinary urge/incontinent episodes about 15x/week, but recently stopped taking tolterodine.   O:   Last 3 Office BP readings: BP Readings from Last 3 Encounters:  10/06/15 146/59  10/04/15 184/88  09/29/15 146/94    BMET    Component Value Date/Time   NA 136 10/04/2015 1055   K 3.4* 10/04/2015 1055   CL 99 10/04/2015 1055   CO2 26 10/04/2015 1055   GLUCOSE 131* 10/04/2015 1055   BUN 16 10/04/2015 1055   CREATININE 1.06* 10/04/2015 1055   CREATININE 0.86 08/12/2009 0414   CALCIUM 10.2 10/04/2015 1055   GFRNONAA 54* 10/04/2015 1055   GFRAA 62 10/04/2015 1055    A/P: Hypertension longstanding currently controlled on current medications. Continue lisinopril 20 mg daily, discontinue HCTZ, start amlodipine 2.5 mg daily.   Uncontrolled urinary urgency/incontinence: Discontinue HCTZ. Follow up if occurrence has decreased at follow up visit.  Uncontrolled diabetes: educated patient on how to use her BG meter. Begin checking BG at least once daily. Continue metformin.  Patient requests to restart clorazepate for anxiety, referring to PCP. May consider alternative therapy such as hydroxyzine, buspirone, or an SSRI/SNRI.  Results reviewed and written information provided.   Total time in face-to-face counseling 45 minutes.   F/U BP at next appointment with Jeanie on 10/20/15.  Patient seen with Mikael Spray, PharmD Candidate and Joya San, PharmD Resident.

## 2015-10-06 NOTE — Assessment & Plan Note (Signed)
Hypertension longstanding currently controlled on current medications. Continue lisinopril 20 mg daily, discontinue HCTZ, start amlodipine 2.5 mg daily.

## 2015-10-06 NOTE — Assessment & Plan Note (Signed)
Uncontrolled diabetes: educated patient on how to use her BG meter. Begin checking BG at least once daily. Continue metformin.

## 2015-10-06 NOTE — Patient Instructions (Signed)
Thanks for coming in today.  Please stop taking Lisinopril/HCTZ combination tablet.  Please start taking Lisinopril 20 mg once daily and Amlodipine 2.5 mg once daily.  Start checking blood sugars regularly with new glucose meter. Please bring meter to all appointments.

## 2015-10-12 DIAGNOSIS — G4733 Obstructive sleep apnea (adult) (pediatric): Secondary | ICD-10-CM | POA: Diagnosis not present

## 2015-10-14 ENCOUNTER — Telehealth: Payer: Self-pay | Admitting: Family Medicine

## 2015-10-14 DIAGNOSIS — E876 Hypokalemia: Secondary | ICD-10-CM

## 2015-10-14 NOTE — Telephone Encounter (Signed)
Attempted to reach patient to discuss lab results. Potassium was a little low. Recommend recheck when she sees Dr. Jenne Campus in a couple of weeks.  Left vm asking patient to return the call.  When she calls back please tell her that her labwork overall looked good but potassium was a little low. Ask her to schedule lab visit when she comes for her nutrition appointment. She does not need to be fasting.  Thanks [name], MD

## 2015-10-20 ENCOUNTER — Ambulatory Visit: Payer: Medicare HMO | Admitting: Family Medicine

## 2015-10-20 ENCOUNTER — Telehealth: Payer: Self-pay | Admitting: Pulmonary Disease

## 2015-10-20 DIAGNOSIS — G4733 Obstructive sleep apnea (adult) (pediatric): Secondary | ICD-10-CM | POA: Diagnosis not present

## 2015-10-20 NOTE — Telephone Encounter (Signed)
HST 10/12/15 >> AHI 26, SpO2 low 83%  Will have my nurse inform pt that sleep study shows moderate to severe sleep apnea.  Please arrange for CPAP 6 cm H2O with heated humidity and mask of choice.  Please schedule ROV with me or nurse practitioner 3 months after starting CPAP.

## 2015-10-21 NOTE — Telephone Encounter (Signed)
ATC pt, NA, no voicemail. wcb

## 2015-10-24 ENCOUNTER — Other Ambulatory Visit: Payer: Self-pay | Admitting: *Deleted

## 2015-10-24 DIAGNOSIS — G4733 Obstructive sleep apnea (adult) (pediatric): Secondary | ICD-10-CM

## 2015-10-24 NOTE — Telephone Encounter (Signed)
Red team, can you call patient?  Thanks [name], MD

## 2015-10-25 NOTE — Telephone Encounter (Signed)
Pt informed. Kameron Glazebrook, CMA  

## 2015-10-26 NOTE — Telephone Encounter (Signed)
Patient did not want to be set up with CPAP machine, she said that she doesn't think she has severe sleep apnea, that she has been sleeping fine lately.  She said that she thinks that she just had trouble with the HST and that it can't be that bad.  She said that she would rather come in and discuss results at [place] before committing to CPAP.  Scheduled patient to see TP to discuss.  Nothing further needed.

## 2015-11-03 ENCOUNTER — Ambulatory Visit (INDEPENDENT_AMBULATORY_CARE_PROVIDER_SITE_OTHER): Payer: Medicare HMO | Admitting: *Deleted

## 2015-11-03 ENCOUNTER — Encounter: Payer: Self-pay | Admitting: *Deleted

## 2015-11-03 ENCOUNTER — Ambulatory Visit: Payer: Medicare HMO | Admitting: *Deleted

## 2015-11-03 VITALS — BP 190/80 | HR 50 | Temp 98.2°F | Ht 61.0 in | Wt 113.8 lb

## 2015-11-03 DIAGNOSIS — Z0000 Encounter for general adult medical examination without abnormal findings: Secondary | ICD-10-CM

## 2015-11-03 NOTE — Patient Instructions (Addendum)
Diabetes and Foot Care Diabetes may cause you to have problems because of poor blood supply (circulation) to your feet and legs. This may cause the skin on your feet to become thinner, break easier, and heal more slowly. Your skin may become dry, and the skin may peel and crack. You may also have nerve damage in your legs and feet causing decreased feeling in them. You may not notice minor injuries to your feet that could lead to infections or more serious problems. Taking care of your feet is one of the most important things you can do for yourself.  HOME CARE INSTRUCTIONS  Wear shoes at all times, even in the house. Do not go barefoot. Bare feet are easily injured.  Check your feet daily for blisters, cuts, and redness. If you cannot see the bottom of your feet, use a mirror or ask someone for help.  Wash your feet with warm water (do not use hot water) and mild soap. Then pat your feet and the areas between your toes until they are completely dry. Do not soak your feet as this can dry your skin.  Apply a moisturizing lotion or petroleum jelly (that does not contain alcohol and is unscented) to the skin on your feet and to dry, brittle toenails. Do not apply lotion between your toes.  Trim your toenails straight across. Do not dig under them or around the cuticle. File the edges of your nails with an emery board or nail file.  Do not cut corns or calluses or try to remove them with medicine.  Wear clean socks or stockings every day. Make sure they are not too tight. Do not wear knee-high stockings since they may decrease blood flow to your legs.  Wear shoes that fit properly and have enough cushioning. To break in new shoes, wear them for just a few hours a day. This prevents you from injuring your feet. Always look in your shoes before you put them on to be sure there are no objects inside.  Do not cross your legs. This may decrease the blood flow to your feet.  If you find a minor scrape,  cut, or break in the skin on your feet, keep it and the skin around it clean and dry. These areas may be cleansed with mild soap and water. Do not cleanse the area with peroxide, alcohol, or iodine.  When you remove an adhesive bandage, be sure not to damage the skin around it.  If you have a wound, look at it several times a day to make sure it is healing.  Do not use heating pads or hot water bottles. They may burn your skin. If you have lost feeling in your feet or legs, you may not know it is happening until it is too late.  Make sure your health care provider performs a complete foot exam at least annually or more often if you have foot problems. Report any cuts, sores, or bruises to your health care provider immediately. SEEK MEDICAL CARE IF:   You have an injury that is not healing.  You have cuts or breaks in the skin.  You have an ingrown nail.  You notice redness on your legs or feet.  You feel burning or tingling in your legs or feet.  You have pain or cramps in your legs and feet.  Your legs or feet are numb.  Your feet always feel cold. SEEK IMMEDIATE MEDICAL CARE IF:   There is increasing redness,  swelling, or pain in or around a wound.  There is a red line that goes up your leg.  Pus is coming from a wound.  You develop a fever or as directed by your health care provider.  You notice a bad smell coming from an ulcer or wound.   This information is not intended to replace advice given to you by your health care provider. Make sure you discuss any questions you have with your health care provider.   Document Released: 04/13/2000 Document Revised: 12/17/2012 Document Reviewed: 09/23/2012 Elsevier Interactive Patient Education 2016 [employer]  Fall Prevention in the Home  Falls can cause injuries. They can happen to people of all ages. There are many things you can do to make your home safe and to help prevent falls.  WHAT CAN I DO ON THE OUTSIDE OF MY  HOME?  Regularly fix the edges of walkways and driveways and fix any cracks.  Remove anything that might make you trip as you walk through a door, such as a raised step or threshold.  Trim any bushes or trees on the path to your home.  Use bright outdoor lighting.  Clear any walking paths of anything that might make someone trip, such as rocks or tools.  Regularly check to see if handrails are loose or broken. Make sure that both sides of any steps have handrails.  Any raised decks and porches should have guardrails on the edges.  Have any leaves, snow, or ice cleared regularly.  Use sand or salt on walking paths during winter.  Clean up any spills in your garage right away. This includes oil or grease spills. WHAT CAN I DO IN THE BATHROOM?   Use night lights.  Install grab bars by the toilet and in the tub and shower. Do not use towel bars as grab bars.  Use non-skid mats or decals in the tub or shower.  If you need to sit down in the shower, use a plastic, non-slip stool.  Keep the floor dry. Clean up any water that spills on the floor as soon as it happens.  Remove soap buildup in the tub or shower regularly.  Attach bath mats securely with double-sided non-slip rug tape.  Do not have throw rugs and other things on the floor that can make you trip. WHAT CAN I DO IN THE BEDROOM?  Use night lights.  Make sure that you have a light by your bed that is easy to reach.  Do not use any sheets or blankets that are too big for your bed. They should not hang down onto the floor.  Have a firm chair that has side arms. You can use this for support while you get dressed.  Do not have throw rugs and other things on the floor that can make you trip. WHAT CAN I DO IN THE KITCHEN?  Clean up any spills right away.  Avoid walking on wet floors.  Keep items that you use a lot in easy-to-reach places.  If you need to reach something above you, use a strong step stool that has a  grab bar.  Keep electrical cords out of the way.  Do not use floor polish or wax that makes floors slippery. If you must use wax, use non-skid floor wax.  Do not have throw rugs and other things on the floor that can make you trip. WHAT CAN I DO WITH MY STAIRS?  Do not leave any items on the stairs.  Make   sure that there are handrails on both sides of the stairs and use them. Fix handrails that are broken or loose. Make sure that handrails are as long as the stairways.  Check any carpeting to make sure that it is firmly attached to the stairs. Fix any carpet that is loose or worn.  Avoid having throw rugs at the top or bottom of the stairs. If you do have throw rugs, attach them to the floor with carpet tape.  Make sure that you have a light switch at the top of the stairs and the bottom of the stairs. If you do not have them, ask someone to add them for you. WHAT ELSE CAN I DO TO HELP PREVENT FALLS?  Wear shoes that:  Do not have high heels.  Have rubber bottoms.  Are comfortable and fit you well.  Are closed at the toe. Do not wear sandals.  If you use a stepladder:  Make sure that it is fully opened. Do not climb a closed stepladder.  Make sure that both sides of the stepladder are locked into place.  Ask someone to hold it for you, if possible.  Clearly mark and make sure that you can see:  Any grab bars or handrails.  First and last steps.  Where the edge of each step is.  Use tools that help you move around (mobility aids) if they are needed. These include:  Canes.  Walkers.  Scooters.  Crutches.  Turn on the lights when you go into a dark area. Replace any light bulbs as soon as they burn out.  Set up your furniture so you have a clear path. Avoid moving your furniture around.  If any of your floors are uneven, fix them.  If there are any pets around you, be aware of where they are.  Review your medicines with your doctor. Some medicines can make  you feel dizzy. This can increase your chance of falling. Ask your doctor what other things that you can do to help prevent falls.   This information is not intended to replace advice given to you by your health care provider. Make sure you discuss any questions you have with your health care provider.   Document Released: 02/10/2009 Document Revised: 08/31/2014 Document Reviewed: 05/21/2014 Elsevier Interactive Patient Education 2016 [place] Maintenance, Female Adopting a healthy lifestyle and getting preventive care can go a long way to promote health and wellness. Talk with your health care provider about what schedule of regular examinations is right for you. This is a good chance for you to check in with your provider about disease prevention and staying healthy. In between checkups, there are plenty of things you can do on your own. Experts have done a lot of research about which lifestyle changes and preventive measures are most likely to keep you healthy. Ask your health care provider for more information. WEIGHT AND DIET  Eat a healthy diet  Be sure to include plenty of vegetables, fruits, low-fat dairy products, and lean protein.  Do not eat a lot of foods high in solid fats, added sugars, or salt.  Get regular exercise. This is one of the most important things you can do for your health.  Most adults should exercise for at least 150 minutes each week. The exercise should increase your heart rate and make you sweat (moderate-intensity exercise).  Most adults should also do strengthening exercises at least twice a week. This is in addition to the moderate-intensity  exercise.  Maintain a healthy weight  Body mass index (BMI) is a measurement that can be used to identify possible weight problems. It estimates body fat based on height and weight. Your health care provider can help determine your BMI and help you achieve or maintain a healthy weight.  For females 14  years of age and older:   A BMI below 18.5 is considered underweight.  A BMI of 18.5 to 24.9 is normal.  A BMI of 25 to 29.9 is considered overweight.  A BMI of 30 and above is considered obese.  Watch levels of cholesterol and blood lipids  You should start having your blood tested for lipids and cholesterol at 69 years of age, then have this test every 5 years.  You may need to have your cholesterol levels checked more often if:  Your lipid or cholesterol levels are high.  You are older than 69 years of age.  You are at high risk for heart disease.  CANCER SCREENING   Lung Cancer  Lung cancer screening is recommended for adults 35-14 years old who are at high risk for lung cancer because of a history of smoking.  A yearly low-dose CT scan of the lungs is recommended for people who:  Currently smoke.  Have quit within the past 15 years.  Have at least a 30-pack-year history of smoking. A pack year is smoking an average of one pack of cigarettes a day for 1 year.  Yearly screening should continue until it has been 15 years since you quit.  Yearly screening should stop if you develop a health problem that would prevent you from having lung cancer treatment.  Breast Cancer  Practice breast self-awareness. This means understanding how your breasts normally appear and feel.  It also means doing regular breast self-exams. Let your health care provider know about any changes, no matter how small.  If you are in your 20s or 30s, you should have a clinical breast exam (CBE) by a health care provider every 1-3 years as part of a regular health exam.  If you are 41 or older, have a CBE every year. Also consider having a breast X-ray (mammogram) every year.  If you have a family history of breast cancer, talk to your health care provider about genetic screening.  If you are at high risk for breast cancer, talk to your health care provider about having an MRI and a mammogram  every year.  Breast cancer gene (BRCA) assessment is recommended for women who have family members with BRCA-related cancers. BRCA-related cancers include:  Breast.  Ovarian.  Tubal.  Peritoneal cancers.  Results of the assessment will determine the need for genetic counseling and BRCA1 and BRCA2 testing. Cervical Cancer Your health care provider may recommend that you be screened regularly for cancer of the pelvic organs (ovaries, uterus, and vagina). This screening involves a pelvic examination, including checking for microscopic changes to the surface of your cervix (Pap test). You may be encouraged to have this screening done every 3 years, beginning at age 62.  For women ages 57-65, health care providers may recommend pelvic exams and Pap testing every 3 years, or they may recommend the Pap and pelvic exam, combined with testing for human papilloma virus (HPV), every 5 years. Some types of HPV increase your risk of cervical cancer. Testing for HPV may also be done on women of any age with unclear Pap test results.  Other health care providers may not recommend any  screening for nonpregnant women who are considered low risk for pelvic cancer and who do not have symptoms. Ask your health care provider if a screening pelvic exam is right for you.  If you have had past treatment for cervical cancer or a condition that could lead to cancer, you need Pap tests and screening for cancer for at least 20 years after your treatment. If Pap tests have been discontinued, your risk factors (such as having a new sexual partner) need to be reassessed to determine if screening should resume. Some women have medical problems that increase the chance of getting cervical cancer. In these cases, your health care provider may recommend more frequent screening and Pap tests. Colorectal Cancer  This type of cancer can be detected and often prevented.  Routine colorectal cancer screening usually begins at 69  years of age and continues through 69 years of age.  Your health care provider may recommend screening at an earlier age if you have risk factors for colon cancer.  Your health care provider may also recommend using home test kits to check for hidden blood in the stool.  A small camera at the end of a tube can be used to examine your colon directly (sigmoidoscopy or colonoscopy). This is done to check for the earliest forms of colorectal cancer.  Routine screening usually begins at age 108.  Direct examination of the colon should be repeated every 5-10 years through 69 years of age. However, you may need to be screened more often if early forms of precancerous polyps or small growths are found. Skin Cancer  Check your skin from head to toe regularly.  Tell your health care provider about any new moles or changes in moles, especially if there is a change in a mole's shape or color.  Also tell your health care provider if you have a mole that is larger than the size of a pencil eraser.  Always use sunscreen. Apply sunscreen liberally and repeatedly throughout the day.  Protect yourself by wearing long sleeves, pants, a wide-brimmed hat, and sunglasses whenever you are outside. HEART DISEASE, DIABETES, AND HIGH BLOOD PRESSURE   High blood pressure causes heart disease and increases the risk of stroke. High blood pressure is more likely to develop in:  People who have blood pressure in the high end of the normal range (130-139/85-89 mm Hg).  People who are overweight or obese.  People who are African American.  If you are 39-40 years of age, have your blood pressure checked every 3-5 years. If you are 71 years of age or older, have your blood pressure checked every year. You should have your blood pressure measured twice--once when you are at a hospital or clinic, and once when you are not at a hospital or clinic. Record the average of the two measurements. To check your blood pressure  when you are not at a hospital or clinic, you can use:  An automated blood pressure machine at a pharmacy.  A home blood pressure monitor.  If you are between 53 years and 44 years old, ask your health care provider if you should take aspirin to prevent strokes.  Have regular diabetes screenings. This involves taking a blood sample to check your fasting blood sugar level.  If you are at a normal weight and have a low risk for diabetes, have this test once every three years after 69 years of age.  If you are overweight and have a high risk for diabetes, consider  being tested at a younger age or more often. PREVENTING INFECTION  Hepatitis B  If you have a higher risk for hepatitis B, you should be screened for this virus. You are considered at high risk for hepatitis B if:  You were born in a country where hepatitis B is common. Ask your health care provider which countries are considered high risk.  Your parents were born in a high-risk country, and you have not been immunized against hepatitis B (hepatitis B vaccine).  You have HIV or AIDS.  You use needles to inject street drugs.  You live with someone who has hepatitis B.  You have had sex with someone who has hepatitis B.  You get hemodialysis treatment.  You take certain medicines for conditions, including cancer, organ transplantation, and autoimmune conditions. Hepatitis C  Blood testing is recommended for:  Everyone born from [birth year] through 1964.  Anyone with known risk factors for hepatitis C. Sexually transmitted infections (STIs)  You should be screened for sexually transmitted infections (STIs) including gonorrhea and chlamydia if:  You are sexually active and are younger than 69 years of age.  You are older than 69 years of age and your health care provider tells you that you are at risk for this type of infection.  Your sexual activity has changed since you were last screened and you are at an increased risk  for chlamydia or gonorrhea. Ask your health care provider if you are at risk.  If you do not have HIV, but are at risk, it may be recommended that you take a prescription medicine daily to prevent HIV infection. This is called pre-exposure prophylaxis (PrEP). You are considered at risk if:  You are sexually active and do not regularly use condoms or know the HIV status of your partner(s).  You take drugs by injection.  You are sexually active with a partner who has HIV. Talk with your health care provider about whether you are at high risk of being infected with HIV. If you choose to begin PrEP, you should first be tested for HIV. You should then be tested every 3 months for as long as you are taking PrEP.  PREGNANCY   If you are premenopausal and you may become pregnant, ask your health care provider about preconception counseling.  If you may become pregnant, take 400 to 800 micrograms (mcg) of folic acid every day.  If you want to prevent pregnancy, talk to your health care provider about birth control (contraception). OSTEOPOROSIS AND MENOPAUSE   Osteoporosis is a disease in which the bones lose minerals and strength with aging. This can result in serious bone fractures. Your risk for osteoporosis can be identified using a bone density scan.  If you are 46 years of age or older, or if you are at risk for osteoporosis and fractures, ask your health care provider if you should be screened.  Ask your health care provider whether you should take a calcium or vitamin D supplement to lower your risk for osteoporosis.  Menopause may have certain physical symptoms and risks.  Hormone replacement therapy may reduce some of these symptoms and risks. Talk to your health care provider about whether hormone replacement therapy is right for you.  HOME CARE INSTRUCTIONS   Schedule regular health, dental, and eye exams.  Stay current with your immunizations.   Do not use any tobacco products  including cigarettes, chewing tobacco, or electronic cigarettes.  If you are pregnant, do not drink alcohol.  If you are breastfeeding, limit how much and how often you drink alcohol.  Limit alcohol intake to no more than 1 drink per day for nonpregnant women. One drink equals 12 ounces of beer, 5 ounces of wine, or 1 ounces of hard liquor.  Do not use street drugs.  Do not share needles.  Ask your health care provider for help if you need support or information about quitting drugs.  Tell your health care provider if you often feel depressed.  Tell your health care provider if you have ever been abused or do not feel safe at home.   This information is not intended to replace advice given to you by your health care provider. Make sure you discuss any questions you have with your health care provider.   Document Released: 10/30/2010 Document Revised: 05/07/2014 Document Reviewed: 03/18/2013 Elsevier Interactive Patient Education 2016 [employer]. Diabetes and food Diabetes Mellitus and Food It is important for you to manage your blood sugar (glucose) level. Your blood glucose level can be greatly affected by what you eat. Eating healthier foods in the appropriate amounts throughout the day at about the same time each day will help you control your blood glucose level. It can also help slow or prevent worsening of your diabetes mellitus. Healthy eating may even help you improve the level of your blood pressure and reach or maintain a healthy weight.  General recommendations for healthful eating and cooking habits include:  Eating meals and snacks regularly. Avoid going long periods of time without eating to lose weight.  Eating a diet that consists mainly of plant-based foods, such as fruits, vegetables, nuts, legumes, and whole grains.  Using low-heat cooking methods, such as baking, instead of high-heat cooking methods, such as deep frying. Work with your dietitian to make sure  you understand how to use the Nutrition Facts information on food labels. HOW CAN FOOD AFFECT ME? Carbohydrates Carbohydrates affect your blood glucose level more than any other type of food. Your dietitian will help you determine how many carbohydrates to eat at each meal and teach you how to count carbohydrates. Counting carbohydrates is important to keep your blood glucose at a healthy level, especially if you are using insulin or taking certain medicines for diabetes mellitus. Alcohol Alcohol can cause sudden decreases in blood glucose (hypoglycemia), especially if you use insulin or take certain medicines for diabetes mellitus. Hypoglycemia can be a life-threatening condition. Symptoms of hypoglycemia (sleepiness, dizziness, and disorientation) are similar to symptoms of having too much alcohol.  If your health care provider has given you approval to drink alcohol, do so in moderation and use the following guidelines:  Women should not have more than one drink per day, and men should not have more than two drinks per day. One drink is equal to:  12 oz of beer.  5 oz of wine.  1 oz of hard liquor.  Do not drink on an empty stomach.  Keep yourself hydrated. Have water, diet soda, or unsweetened iced tea.  Regular soda, juice, and other mixers might contain a lot of carbohydrates and should be counted. WHAT FOODS ARE NOT RECOMMENDED? As you make food choices, it is important to remember that all foods are not the same. Some foods have fewer nutrients per serving than other foods, even though they might have the same number of calories or carbohydrates. It is difficult to get your body what it needs when you eat foods with fewer nutrients. Examples of foods that  you should avoid that are high in calories and carbohydrates but low in nutrients include:  Trans fats (most processed foods list trans fats on the Nutrition Facts label).  Regular soda.  Juice.  Candy.  Sweets, such as  cake, pie, doughnuts, and cookies.  Fried foods. WHAT FOODS CAN I EAT? Eat nutrient-rich foods, which will nourish your body and keep you healthy. The food you should eat also will depend on several factors, including:  The calories you need.  The medicines you take.  Your weight.  Your blood glucose level.  Your blood pressure level.  Your cholesterol level. You should eat a variety of foods, including:  Protein.  Lean cuts of meat.  Proteins low in saturated fats, such as fish, egg whites, and beans. Avoid processed meats.  Fruits and vegetables.  Fruits and vegetables that may help control blood glucose levels, such as apples, mangoes, and yams.  Dairy products.  Choose fat-free or low-fat dairy products, such as milk, yogurt, and cheese.  Grains, bread, pasta, and rice.  Choose whole grain products, such as multigrain bread, whole oats, and brown rice. These foods may help control blood pressure.  Fats.  Foods containing healthful fats, such as nuts, avocado, olive oil, canola oil, and fish. DOES EVERYONE WITH DIABETES MELLITUS HAVE THE SAME MEAL PLAN? Because every person with diabetes mellitus is different, there is not one meal plan that works for everyone. It is very important that you meet with a dietitian who will help you create a meal plan that is just right for you.   This information is not intended to replace advice given to you by your health care provider. Make sure you discuss any questions you have with your health care provider.   Document Released: 01/11/2005 Document Revised: 05/07/2014 Document Reviewed: 03/13/2013 Elsevier Interactive Patient Education [employer].

## 2015-11-03 NOTE — Progress Notes (Signed)
Subjective:   Mallory Hayes is a 70 y.o. female who presents for an Initial Medicare Annual Wellness Visit.   Cardiac Risk Factors include: advanced age (>70men, >13 women);diabetes mellitus;dyslipidemia;hypertension     Objective:    Today's Vitals   11/03/15 1137  BP: 182/60  Pulse: 50  Temp: 98.2 F (36.8 C)  TempSrc: Oral  Height: 5\' 1"  (1.549 m)  Weight: 113 lb 12.8 oz (51.619 kg)  SpO2: 99%   Body mass index is 21.51 kg/(m^2). BP recheck 190/80  left arm manually with adult cuff. Patient denies headache, SHOB, chest pain. States she was crying prior to appt and thinks that may be why BP is elevated. Patient takes lisinopril and norvasc at 4 PM daily. Took last dose yesterday at 4 pm. PCP made aware.   Current Medications (verified) Outpatient Encounter Prescriptions as of 11/03/2015  Medication Sig  . amLODipine (NORVASC) 2.5 MG tablet Take 1 tablet (2.5 mg total) by mouth daily.  Marland Kitchen aspirin 81 MG tablet Take 81 mg by mouth daily.    . hydrocortisone cream 0.5 % Apply 1 application topically 2 (two) times daily as needed for itching.  Marland Kitchen lisinopril (PRINIVIL,ZESTRIL) 20 MG tablet Take 1 tablet (20 mg total) by mouth daily.  . metFORMIN (GLUCOPHAGE-XR) 500 MG 24 hr tablet TAKE 2 TABLETS EVERY DAY  WITH  BREAKFAST  . mometasone (NASONEX) 50 MCG/ACT nasal spray Place 2 sprays into the nose daily. (Patient taking differently: Place 2 sprays into the nose as needed. )  . pantoprazole (PROTONIX) 40 MG tablet Take 1 tablet (40 mg total) by mouth daily. (Patient taking differently: Take 40 mg by mouth as needed. )  . polyethylene glycol powder (GLYCOLAX/MIRALAX) powder MIX 1 CAPFUL (17GM) IN LIQUID AND DRINK EVERY DAY AS NEEDED FOR  CONSTIPATION  . rosuvastatin (CRESTOR) 20 MG tablet Take 1 tablet (20 mg total) by mouth at bedtime.  . vitamin E 200 UNIT capsule Take 200 Units by mouth daily.    . calcium citrate-vitamin D 500-400 MG-UNIT chewable tablet Chew 2 tablets by mouth  daily. (Patient not taking: Reported on 10/06/2015)  . tolterodine (DETROL LA) 4 MG 24 hr capsule Take 1 capsule (4 mg total) by mouth daily. (Patient not taking: Reported on 10/06/2015)   No facility-administered encounter medications on file as of 11/03/2015.    Allergies (verified) Amaryl; Food; and Sulfamethoxazole   History: Past Medical History  Diagnosis Date  . Diabetes mellitus   . Hypertension   . GERD (gastroesophageal reflux disease)   . Urinary incontinence   . Sleep apnea    Past Surgical History  Procedure Laterality Date  . Appendectomy    . Cholecystectomy    . Colonoscopy  2011  . Partial hysterectomy  1991   Family History  Problem Relation Age of Onset  . Cancer Mother   . Hypertension Mother   . Diabetes Mother 87  . Heart disease Mother 63  . Colon cancer Mother 65  . Ovarian cancer Mother   . Cancer Father   . Prostatitis Brother   . Diabetes Brother    Social History   Occupational History  . Not on file.   Social History Main Topics  . Smoking status: Never Smoker   . Smokeless tobacco: Never Used  . Alcohol Use: No  . Drug Use: No  . Sexual Activity: No    Tobacco Counseling Counseling given: Yes   Activities of Daily Living In your present state of health, do  you have any difficulty performing the following activities: 11/03/2015 07/29/2015  Hearing? N N  Vision? Y N  Difficulty concentrating or making decisions? Y N  Walking or climbing stairs? N N  Dressing or bathing? N N  Doing errands, shopping? N N  Preparing Food and eating ? Y N  Using the Toilet? N N  In the past six months, have you accidently leaked urine? Y N  Do you have problems with loss of bowel control? N N  Managing your Medications? N N  Managing your Finances? N N  Housekeeping or managing your Housekeeping? N N  Home Safety:  My home has a working smoke alarm:  Yes           My home throw rugs have been fastened down to the floor or removed:  Yes I have  non-slip mats in the bathtub and shower:  Yes         All my home's stairs have railings or bannisters: Home is one level. 2 outside stairs with railings.         My home's floors, stairs and hallways are free from clutter, wires and cords:  Yes        Immunizations and Health Maintenance Immunization History  Administered Date(s) Administered  . H1N1 04/28/2008  . Influenza Split 02/02/2011, 05/12/2012  . Influenza Whole 02/28/2005, 03/06/2007, 04/28/2008, 02/28/2009, 03/01/2010  . Influenza,inj,Quad PF,36+ Mos 02/19/2013, 03/10/2014, 01/05/2015  . Pneumococcal Conjugate-13 12/02/2014  . Pneumococcal Polysaccharide-23 03/06/2007, 11/21/2011  . Td 02/28/2005   Health Maintenance Due  Topic Date Due  . TETANUS/TDAP  03/01/2015  . OPHTHALMOLOGY EXAM  08/23/2015    Patient Care Team: [name], MD as PCP - General (Pediatrics) Kennith Center, RD as Dietitian (Family Medicine) Warden Fillers, MD as Consulting Physician (Ophthalmology) Gardiner Barefoot, DPM as Consulting Physician (Podiatry) Patient sees Dr. Henreitta Leber for Dentistry  Indicate any recent Medical Services you may have received from other than [facility] providers in the past year (date may be approximate).     Assessment:   This is a routine wellness examination for Akyla.   Hearing/Vision screen  Hearing Screening   125Hz  250Hz  500Hz  1000Hz  2000Hz  4000Hz  8000Hz   Right ear:   40 40 40 40   Left ear:   40 40 40 40     Dietary issues and exercise activities discussed: Current Exercise Habits: Home exercise routine, Type of exercise: walking;stretching;calisthenics, Time (Minutes): 40, Frequency (Times/Week): 6, Weekly Exercise (Minutes/Week): 240, Intensity: Mild  Goals    . Blood Pressure < 140/90      Depression Screen PHQ 2/9 Scores 11/03/2015 10/04/2015 09/29/2015 08/23/2015 07/29/2015 07/12/2015 05/23/2015  PHQ - 2 Score 2 0 1 0 0 0 0  PHQ- 9 Score 11 - - - - - -  Patient states she is having difficulty  sleeping and is fearful upon waking. 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  Fall Risk Fall Risk  11/03/2015 08/23/2015 07/29/2015 07/12/2015 05/23/2015  Falls in the past year? Yes No No No No  Number falls in past yr: 2 or more - - - -  Injury with Fall? No - - - -  Risk Factor Category  High Fall Risk - - - -  Risk for fall due to : Impaired balance/gait - - - -  Follow up Education provided;Falls prevention  discussed - - - -    Cognitive Function: Mini-Cog passed with score 4/5  TUG Test:  Done in 6 seconds. Patient used both hands to push out of chair and to sit back down.  Screening Tests Health Maintenance  Topic Date Due  . TETANUS/TDAP  03/01/2015  . OPHTHALMOLOGY EXAM  08/23/2015  . INFLUENZA VACCINE  11/29/2015  . HEMOGLOBIN A1C  02/22/2016  . MAMMOGRAM  04/29/2016  . FOOT EXAM  09/28/2016  . COLONOSCOPY  02/04/2019  . DEXA SCAN  Completed  . ZOSTAVAX  Addressed  . Hepatitis C Screening  Completed  . PNA vac Low Risk Adult  Completed      Plan:   Patient advised to schedule appt with PCP to discuss BP, difficulty sleeping and depression.   During the course of the visit, Ziah was educated and counseled about the following appropriate screening and preventive services:   Vaccines to include Pneumoccal, Influenza, Td, Zostavax  Colorectal cancer screening  Bone density screening  Diabetes screening  Glaucoma screening  Mammography/PAP  Nutrition counseling   Patient Instructions (the written plan) were given to the patient.    Velora Heckler, RN   11/03/2015

## 2015-11-03 NOTE — Progress Notes (Signed)
Patient ID: Mallory Hayes, female   DOB: [birth year], 70 y.o.   MRN: [id number]   Patient referred to [facility] by RN Howell Rucks during office visit     Presenting Issue: Stress of preparing meals, not being able to sleep and headaches.   Report of symptoms:  feeling depressed and difficult sleeping, headaches due to stress. Symptoms currently limiting her of doing going out as much as she use to and not eating healthy.   Depression screen PHQ 2/9 11/03/2015  Decreased Interest 0  Down, Depressed, Hopeless 2  PHQ - 2 Score 2  Altered sleeping 2  Tired, decreased energy 1  Change in appetite 1  Feeling bad or failure about yourself  2  Trouble concentrating 2  Moving slowly or fidgety/restless 1  Suicidal thoughts 0  PHQ-9 Score 11  Difficult doing work/chores Not difficult at all   Assessment  Patient lives alone, has no children. Two brothers that live out of town, local support system is her church and friends.  Tries to attend church weekly.  Retired from the [employer] in 1993. Patient has a history of depression. Patient denies SI, HI, substance use, no history of outpatient or inpatient psych treatment.   Plan/Recommendations:   Patient is experiencing minor depression and would benefit from brief therapy to assist with managing her stressors. Patient is in agreement to meet with INT to address her symptoms and establish goals. Patient's treatment plan was discussed and goals were established.  Interventions will include a combination of Motivational Interviewing, Cognitive Behavioral therapy, Problem Solving and Behavior Activation.  Treatment will focused on establishing a therapeutic relationship, psycho-education, identifying presenting problems, relaxation techniques and coping skills for stressors.   Patient was provide educational information on depression and healthy sleep during this visit.   Patient's Goals:  1. Get a good night sleep 2. Managing stressors  Patient is in agreement to  return to INT clinic next week to work on her goals.  Casimer Lanius. LCSW Clinical Social Work, [place]   [phone] 2:12 PM

## 2015-11-04 ENCOUNTER — Encounter: Payer: Self-pay | Admitting: *Deleted

## 2015-11-04 NOTE — Progress Notes (Signed)
I have reviewed this visit and discussed with Howell Rucks, RN, BSN, and agree with her documentation.  [name], MD   Note - per Ander Purpura, patient not symptomatic from elevated blood pressure at time of visit. She will follow up with provider in clinic to discuss blood pressure elevation.

## 2015-11-10 ENCOUNTER — Ambulatory Visit: Payer: Medicare HMO

## 2015-11-10 ENCOUNTER — Telehealth: Payer: Self-pay | Admitting: Licensed Clinical Social Worker

## 2015-11-10 NOTE — Telephone Encounter (Signed)
Call from patient to reschedule [facility] appointment.  Appointment rescheduled for Aug 3rd at 1:30.  CSW shared information and locations for [employer].  Patient was appreciative of information and states she will follow up.  Plan: Patient will go visit [facility], complete the nutrition application and follow up with CSW on Aug 3rd for INT clinic.   Casimer Lanius, LCSW Licensed Clinical Social Worker [facility]   [phone] 11:40 AM

## 2015-11-15 ENCOUNTER — Ambulatory Visit (INDEPENDENT_AMBULATORY_CARE_PROVIDER_SITE_OTHER): Payer: Medicare HMO | Admitting: Adult Health

## 2015-11-15 ENCOUNTER — Encounter: Payer: Self-pay | Admitting: Adult Health

## 2015-11-15 VITALS — BP 140/88 | HR 46 | Temp 97.5°F | Ht 61.0 in | Wt 114.0 lb

## 2015-11-15 DIAGNOSIS — G4733 Obstructive sleep apnea (adult) (pediatric): Secondary | ICD-10-CM

## 2015-11-15 NOTE — Assessment & Plan Note (Signed)
Moderate OSA  Pt education given on OSA and CPAP   Plan  Begin CPAP At bedtime  .  Goal is to wear for at least 4hr each night .  Do not drive if sleepy  Follow up with Dr. Halford Chessman  In 6-8 weeks with download .

## 2015-11-15 NOTE — Progress Notes (Signed)
Reviewed and agree with assessment/plan.  Chesley Mires, MD [facility] 11/15/2015, 12:24 PM Pager:  [phone]

## 2015-11-15 NOTE — Patient Instructions (Signed)
Begin CPAP At bedtime  .  Goal is to wear for at least 4hr each night .  Do not drive if sleepy  Follow up with Dr. Halford Chessman  In 6-8 weeks with download .

## 2015-11-15 NOTE — Progress Notes (Signed)
Subjective:    Patient ID: Mallory Hayes, female    DOB: 1946/01/20, 70 y.o.   MRN: [id number]  HPI 70 yo female seen for sleep consult on 08/26/15 . Found to have moderate OSA on HST   TEST  HST 10/12/15 >> AHI 26, SpO2 low 83%  11/15/2015 Follow up : Moderate OSA Pt returns for 3 month follow up . Seen last ov for sleep consult  Set up for HST on 10/12/15 found to have moderate OSA with AHI at 26/h, saO2 low 83%.  Has daytime sleepiness especially in am, wakes up frequently during nighttime.  We discussed her sleep study results. Reviewed treatment options with oral appliance and CPAP .  She would like to proceed with CPAP .  She denies chest pain, orthopnea, edema or fever.     Past Medical History  Diagnosis Date  . Diabetes mellitus   . Hypertension   . GERD (gastroesophageal reflux disease)   . Urinary incontinence   . Sleep apnea    Current Outpatient Prescriptions on File Prior to Visit  Medication Sig Dispense Refill  . amLODipine (NORVASC) 2.5 MG tablet Take 1 tablet (2.5 mg total) by mouth daily. 30 tablet 3  . aspirin 81 MG tablet Take 81 mg by mouth daily.      . calcium citrate-vitamin D 500-400 MG-UNIT chewable tablet Chew 2 tablets by mouth daily. 60 tablet 11  . hydrocortisone cream 0.5 % Apply 1 application topically 2 (two) times daily as needed for itching. 30 g 0  . lisinopril (PRINIVIL,ZESTRIL) 20 MG tablet Take 1 tablet (20 mg total) by mouth daily. 90 tablet 3  . metFORMIN (GLUCOPHAGE-XR) 500 MG 24 hr tablet TAKE 2 TABLETS EVERY DAY  WITH  BREAKFAST 180 tablet 3  . mometasone (NASONEX) 50 MCG/ACT nasal spray Place 2 sprays into the nose daily. (Patient taking differently: Place 2 sprays into the nose as needed. ) 17 g 3  . pantoprazole (PROTONIX) 40 MG tablet Take 1 tablet (40 mg total) by mouth daily. (Patient taking differently: Take 40 mg by mouth as needed. ) 90 tablet 3  . polyethylene glycol powder (GLYCOLAX/MIRALAX) powder MIX 1 CAPFUL (17GM) IN  LIQUID AND DRINK EVERY DAY AS NEEDED FOR  CONSTIPATION 1530 g 2  . rosuvastatin (CRESTOR) 20 MG tablet Take 1 tablet (20 mg total) by mouth at bedtime. 90 tablet 3  . vitamin E 200 UNIT capsule Take 200 Units by mouth daily.      Marland Kitchen tolterodine (DETROL LA) 4 MG 24 hr capsule Take 1 capsule (4 mg total) by mouth daily. (Patient not taking: Reported on 10/06/2015) 30 capsule 3   No current facility-administered medications on file prior to visit.     Review of Systems .Constitutional:   No  weight loss, night sweats,  Fevers, chills, fatigue, or  lassitude.  HEENT:   No headaches,  Difficulty swallowing,  Tooth/dental problems, or  Sore throat,                No sneezing, itching, ear ache, nasal congestion, post nasal drip,   CV:  No chest pain,  Orthopnea, PND, swelling in lower extremities, anasarca, dizziness, palpitations, syncope.   GI  No heartburn, indigestion, abdominal pain, nausea, vomiting, diarrhea, change in bowel habits, loss of appetite, bloody stools.   Resp: No shortness of breath with exertion or at rest.  No excess mucus, no productive cough,  No non-productive cough,  No coughing up of blood.  No change  in color of mucus.  No wheezing.  No chest wall deformity  Skin: no rash or lesions.  GU: no dysuria, change in color of urine, no urgency or frequency.  No flank pain, no hematuria   MS:  No joint pain or swelling.  No decreased range of motion.  No back pain.  Psych:  No change in mood or affect. No depression or anxiety.  No memory loss.         Objective:   Physical Exam Filed Vitals:   11/15/15 1104  BP: 140/88  Pulse: 46  Temp: 97.5 F (36.4 C)  TempSrc: Oral  Height: 5\' 1"  (1.549 m)  Weight: 114 lb (51.71 kg)  SpO2: 100%  Body mass index is 21.55 kg/(m^2).   GEN: A/Ox3; pleasant , NAD, thin   HEENT:  [place]/AT,  EACs-clear, TMs-wnl, NOSE-clear,probable deviated septum  THROAT-clear, no lesions, no postnasal drip or exudate noted. Class 2-3 MP  airway, narrow post phayrnx ,   NECK:  Supple w/ fair ROM; no JVD; normal carotid impulses w/o bruits; no thyromegaly or nodules palpated; no lymphadenopathy.  RESP  Clear  P & A; w/o, wheezes/ rales/ or rhonchi.no accessory muscle use, no dullness to percussion  CARD:  RRR, no m/r/g  , no peripheral edema, pulses intact, no cyanosis or clubbing.  GI:   Soft & nt; nml bowel sounds; no organomegaly or masses detected.  Musco: Warm bil, no deformities or joint swelling noted.   Neuro: alert, no focal deficits noted.    Skin: Warm, no lesions or rashes  Jujuan Dugo NP-C  [facility]  11/15/2015        Assessment & Plan:

## 2015-11-21 ENCOUNTER — Ambulatory Visit: Payer: Medicare HMO | Admitting: Pulmonary Disease

## 2015-12-01 ENCOUNTER — Ambulatory Visit (INDEPENDENT_AMBULATORY_CARE_PROVIDER_SITE_OTHER): Payer: Medicare HMO | Admitting: Licensed Clinical Social Worker

## 2015-12-01 ENCOUNTER — Encounter: Payer: Self-pay | Admitting: Family Medicine

## 2015-12-01 ENCOUNTER — Other Ambulatory Visit: Payer: Medicare HMO

## 2015-12-01 ENCOUNTER — Ambulatory Visit (INDEPENDENT_AMBULATORY_CARE_PROVIDER_SITE_OTHER): Payer: Medicare HMO | Admitting: Family Medicine

## 2015-12-01 DIAGNOSIS — F439 Reaction to severe stress, unspecified: Secondary | ICD-10-CM

## 2015-12-01 DIAGNOSIS — Z638 Other specified problems related to primary support group: Secondary | ICD-10-CM

## 2015-12-01 DIAGNOSIS — E119 Type 2 diabetes mellitus without complications: Secondary | ICD-10-CM | POA: Diagnosis not present

## 2015-12-01 DIAGNOSIS — E876 Hypokalemia: Secondary | ICD-10-CM

## 2015-12-01 NOTE — Progress Notes (Signed)
Medical Nutrition Therapy:  Appt start time: 1430 end time:  [id number].  Assessment:  Primary concerns today: Blood sugar control and Meal planning.  Ms. Arnott has started using her CPAP machine on July 28.  She said she feels calmer and more relaxed since starting to get better sleep.  Walking 35 min 7 X wk (only 15 min on Sundays).    24-hr recall:  (Up at 5 AM) V (6 AM)-  1 c coffee, 1 tbsp half&half, Splenda, 3 slcs banana Walked 35 min at 7 AM B (9:30 AM)-  2 T dry oatmeal, olive oil, Splenda, 1 tbsp half&half, 4 oz 1% milk Snk (11:15)-  [employer] protein shake (15 g carb) L (1 PM)-  [employer] Mc[employer]x breaded sandw, mayo, water Snk ( PM)-  --- D (6 PM)-  1 c canned chx noodle soup (poured off all liquid), added pepper, onion, and 10 slices okra, 4 oz pudding, 1 c ginger ale Snk ( PM)-  --- Typical day? Yes.  although days differ.  Combines lunch and dinner 3-4 X wk.    Progress Towards Goal(s):  In progress.   Nutritional Diagnosis:  No significant progress on NB-1.1 Food and nutrition-related knowledge deficit As related to meal planning.  As evidenced by continued reliance on prepared/convenience foods.  In addition, it is clear that Ms. Parris has not understood concepts related to limiting carb intake reviewed at last visit.      Intervention:  Nutrition education.   Handouts given during visit include:  AVS  Demonstrated degree of understanding via:  Teach Back   Monitoring/Evaluation:  Dietary intake, exercise, and body weight in 8 week(s).

## 2015-12-01 NOTE — Patient Instructions (Signed)
-   That 1/2 can of chicken noodle soup provides 3.8 grams of protein and 10 grams of carb.    This is equivalent to ~1/2 ounce of a chicken and 2/3 of a full carb portion.     In addition, at that meal you had vegetables, which adds no significant protein or carb or fat.    The pudding you had provided 18 grams of total carbohydrate, and the ginger ale provided about 25 grams of carb.    This meal was the equivalent of almost 4 carb portions.    - The ideal balance for each lunch and dinner is in the REAL MEAL, which includes a protein food, starch food, and vegetables.    - And remember to limit total carb at each meal to three carb portions, and ONE carb portion equals:   - 1 slice of bread or its equivalent (e.g., 1 hamburger bun = 2 carb portions)   - 1/2 c of a scoopable carb, like rice, corn, or potatoes.     - Every 15 grams of TOTAL CARB on the nutrition label equals one carb portion.    - An alternative to ginger ale is seltzer water (or a drink like LaCroix sparkling water).  You can start with part juice and part seltzer, and over time, increase the proportion of seltzer to juice until you eventually like plain seltzer.    - The most efficient and quickest way to change a taste prefererence is to be very consistent in the new food you're trying to learn to like.    From last appt:   Diet Recommendations for Diabetes   Starchy (carb) foods: Bread, rice, pasta, potatoes, corn, cereal, grits, crackers, bagels, muffins, all baked goods.  (Fruits, milk, and yogurt also have carbohydrate, but most of these foods will not spike your blood sugar as the starchy foods will.)  A few fruits do cause high blood sugars; use small portions of bananas (limit to 1/2 at a time), grapes, watermelon, oranges, and most tropical fruits.    Protein foods: Meat, fish, poultry, eggs, dairy foods, and beans such as pinto and kidney beans (beans also provide carbohydrate).   1. Eat at least 3 meals and 1-2  snacks per day. Never go more than 4-5 hours while awake without eating. Eat breakfast within the first hour of getting up.   2. Limit starchy foods to TWO per meal and ONE per snack. ONE portion of a starchy  food is equal to the following:   - ONE slice of bread (or its equivalent, such as half of a hamburger bun).   - 1/2 cup of a "scoopable" starchy food such as potatoes or rice.   - 15 grams of carbohydrate as shown on food label.  3. Include at every meal: a protein food, a carb food, and vegetables and/or fruit.   - Obtain twice the volume of veg's as protein or carbohydrate foods for both lunch and dinner.   - Fresh or frozen veg's are best.   - Keep frozen veg's on hand for a quick vegetable serving.

## 2015-12-01 NOTE — Progress Notes (Signed)
Patient ID: Mallory Hayes, female   DOB: 11/05/1945, 70 y.o.   MRN: [id number]  Reason for follow-up: brief intervention to address symptoms associated with stress.   Reports no impairment in her daily functions at this time. GAD 7 : Generalized Anxiety Score 12/01/2015  Nervous, Anxious, on Edge 1  Control/stop worrying 1  Worry too much - different things 1  Trouble relaxing 2  Restless 0  Easily annoyed or irritable 0  Afraid - awful might happen 0  Total GAD 7 Score 5  Anxiety Difficulty Somewhat difficult  GAD score is 5 with indication of mild anxiety.  Issues discussed:   Patient presented pleasant and happy, states this is a good day for her.  She feels calm, less worrying and more relaxed. Patient using CPAP machine for the first time this past week. States she is able to see a difference in how she feels. Patient  went to visit [facility] (homework from last encounter) and does not think it's a good fit for her.  Interventions utilized was Motivational Interviewing. Patient's goals were reviewed, no adjustments made. The following issues were discussed: 1. Sleeping better (increase to about 4 to 5 hours each night) 2. Behavior Activation 3. Daily schedule 4. Weekly interaction with other people 5. Review of health Sleep check sheet Plan: Patient will complete the daily schedule and plan each day for the next two weeks, look at possible volunteer opportunities to increase her interaction with other people and decrease isolation. Patient is in agreement with plan and will return to INT clinic in two/ three weeks to continue working on goals to assist with reducing her stress.  Casimer Lanius, LCSW Licensed Clinical Social Worker [place]   [phone] 3:39 PM

## 2015-12-02 LAB — BASIC METABOLIC PANEL WITH GFR
BUN: 18 mg/dL (ref 7–25)
CHLORIDE: 102 mmol/L (ref 98–110)
CO2: 23 mmol/L (ref 20–31)
Calcium: 9.9 mg/dL (ref 8.6–10.4)
Creat: 1.24 mg/dL — ABNORMAL HIGH (ref 0.50–0.99)
GFR, EST NON AFRICAN AMERICAN: 44 mL/min — AB (ref >=60)
GFR, Est African American: 51 mL/min — ABNORMAL LOW (ref >=60)
GLUCOSE: 85 mg/dL (ref 65–99)
POTASSIUM: 4.2 mmol/L (ref 3.5–5.3)
Sodium: 138 mmol/L (ref 135–146)

## 2015-12-16 ENCOUNTER — Telehealth: Payer: Self-pay | Admitting: Family Medicine

## 2015-12-16 NOTE — Telephone Encounter (Signed)
Called patient to discuss recent labs. Renal function was slightly decreased. Not a problem, but warrants repeating in the next several weeks. Patient agreeable to having labs drawn again.  Also, mentions she was seen at urgent care for back pain & had UA which showed a mild amount of blood. Back pain stable now.  Patient also mentioned to me that she's had a funny feeling in her chest after raking for 3-4 hours at a time. Walks daily without any abnormal chest feelings. No associated shortness of breath, sweating, or nausea. She is clear that it is not a chest pain, but just a funny feeling, like a skipped beat. Does not seem she's ever seen a cardiologist.  Recommended she come in for office visit to assess. I am working in the hospital next week and thus am not in clinic. Scheduled appointment with me for the following week to recheck BMET and discuss chest symptoms in more detail.  Advised her of reason sto seek care sooner including actual chest pain, shortness of breath, worsening heart symptoms, etc.  Patient appreciative.  [name], MD

## 2015-12-27 ENCOUNTER — Encounter: Payer: Self-pay | Admitting: Family Medicine

## 2015-12-27 ENCOUNTER — Ambulatory Visit (HOSPITAL_COMMUNITY)
Admission: RE | Admit: 2015-12-27 | Discharge: 2015-12-27 | Disposition: A | Discharge status: Home / Self Care | Payer: Medicare HMO | Source: Ambulatory Visit | Attending: Family Medicine | Admitting: Family Medicine

## 2015-12-27 ENCOUNTER — Ambulatory Visit (INDEPENDENT_AMBULATORY_CARE_PROVIDER_SITE_OTHER): Payer: Medicare HMO | Admitting: Family Medicine

## 2015-12-27 VITALS — BP 184/78 | HR 51 | Temp 98.0°F | Ht 61.0 in | Wt 111.6 lb

## 2015-12-27 DIAGNOSIS — R9431 Abnormal electrocardiogram [ECG] [EKG]: Secondary | ICD-10-CM | POA: Insufficient documentation

## 2015-12-27 DIAGNOSIS — I10 Essential (primary) hypertension: Secondary | ICD-10-CM | POA: Diagnosis not present

## 2015-12-27 DIAGNOSIS — R079 Chest pain, unspecified: Secondary | ICD-10-CM

## 2015-12-27 DIAGNOSIS — R001 Bradycardia, unspecified: Secondary | ICD-10-CM | POA: Diagnosis not present

## 2015-12-27 DIAGNOSIS — E1169 Type 2 diabetes mellitus with other specified complication: Secondary | ICD-10-CM | POA: Diagnosis not present

## 2015-12-27 LAB — BASIC METABOLIC PANEL WITH GFR
BUN: 20 mg/dL (ref 7–25)
CALCIUM: 9.9 mg/dL (ref 8.6–10.4)
CO2: 25 mmol/L (ref 20–31)
CREATININE: 1.16 mg/dL — AB (ref 0.50–0.99)
Chloride: 103 mmol/L (ref 98–110)
GFR, Est African American: 56 mL/min — ABNORMAL LOW (ref >=60)
GFR, Est Non African American: 48 mL/min — ABNORMAL LOW (ref >=60)
Glucose, Bld: 112 mg/dL — ABNORMAL HIGH (ref 65–99)
POTASSIUM: 3.9 mmol/L (ref 3.5–5.3)
Sodium: 139 mmol/L (ref 135–146)

## 2015-12-27 LAB — POCT GLYCOSYLATED HEMOGLOBIN (HGB A1C): HEMOGLOBIN A1C: 6.9

## 2015-12-27 MED ORDER — LISINOPRIL 20 MG PO TABS: 40.0000 mg | ORAL_TABLET | Freq: Every day | ORAL | Status: DC

## 2015-12-27 MED ORDER — TETANUS-DIPHTH-ACELL PERTUSSIS 5-2.5-18.5 LF-MCG/0.5 IM SUSP: 0.5000 mL | Freq: Once | INTRAMUSCULAR | 0 refills | Status: AC

## 2015-12-27 NOTE — Patient Instructions (Addendum)
Diabetes looks great A1c 6.9. I'm proud of you!  For blood pressure: Checking kidney function today Go up to 2 pills of your lisinopril per day (20mg  pills, 40mg  total) Follow up in 1 week for lab visit to recheck kidneys and nurse visit to recheck blood pressure.   I am referring you to a cardiologist for your heart rate. You will get a phone call to schedule this appointment.   Health maintenance: -Please schedule an appointment with your eye doctor for your yearly eye exam -See handout & prescription for tetanus shot  Follow up with me in 3 months, sooner if needed.  Be well, Dr. Ardelia Mems

## 2015-12-27 NOTE — Progress Notes (Signed)
Date of Visit: 12/27/2015   HPI:  Patient presents to follow up on renal function, also to discuss funny feeling in chest when raking.  I spoke with her on the phone a few weeks ago, and she endorsed feeling possible palpitations after raking leaves for 3-4 hours. She rakes nearly every day. Walks daily without any cardiac or respiratory symptoms. She has since backed off of how hard she is raking, and has not had any funny feelings since then. No swelling in legs. Otherwise feels well.  Hypertension - taking lisinopril 20mg  daliy, also amlodpine 2.5mg  daily.  Does not check BP at home.  Diabetes - takes metformin XR 1000mg  daily.  Due for eye exam.   ROS: See HPI.  [place]: history of type 2 diabetes, hypertension, hyperlipidemia, GERD   PHYSICAL EXAM: BP (!) 185/62   Pulse (!) 51   Temp 98 F (36.7 C) (Oral)   Ht 5\' 1"  (1.549 m)   Wt 111 lb 9.6 oz (50.6 kg)   BMI 21.09 kg/m  Gen: NAD, pleasant, cooperative HEENT: normocephalic, atraumatic, moist mucous membranes  Heart: regular rhythm. Mildly bradycardic. No murmur Lungs: clear to auscultation bilaterally, normal work of breathing  Neuro: alert grossly nonfocal speech normal Ext: No appreciable lower extremity edema bilaterally   ASSESSMENT/PLAN:  Health maintenance:  -advised routine eye exam -gave printed rx for tdap  Type 2 diabetes mellitus A1c much improved to 6.9 today. Congratulated patient. She will continue working with Dr. Jenne Campus on nutrition. Follow up with me in 3 months   Cardiac: on statin, aspirin Renal: on ACE, check bmet today Eye: due for appointment, advised to schedule Foot: UTD Immunizations: tdap rx given today.   HYPERTENSION, BENIGN SYSTEMIC Blood pressure up, including on recheck. Will increase lisinopril to 20mg  daliy Recheck bmet today, also follow up in 1 week for RN blood pressure check & BMET.  Bradycardia Noted on vitals & EKG. Sinus bradycardia Given that she has potentially  contributable symptoms (palpitations after raking) will refer to cardiology for further eval Patient agreeable with this plan  FOLLOW UP: Follow up in 3 mos for above issues Referring to cardiology  [place] J. Ardelia Mems, [place]

## 2015-12-28 NOTE — Assessment & Plan Note (Signed)
A1c much improved to 6.9 today. Congratulated patient. She will continue working with Dr. Jenne Campus on nutrition. Follow up with me in 3 months   Cardiac: on statin, aspirin Renal: on ACE, check bmet today Eye: due for appointment, advised to schedule Foot: UTD Immunizations: tdap rx given today.

## 2015-12-28 NOTE — Assessment & Plan Note (Signed)
Blood pressure up, including on recheck. Will increase lisinopril to 20mg  daliy Recheck bmet today, also follow up in 1 week for RN blood pressure check & BMET.

## 2015-12-28 NOTE — Assessment & Plan Note (Signed)
Noted on vitals & EKG. Sinus bradycardia Given that she has potentially contributable symptoms (palpitations after raking) will refer to cardiology for further eval Patient agreeable with this plan

## 2015-12-30 ENCOUNTER — Encounter: Payer: Self-pay | Admitting: Family Medicine

## 2016-01-04 ENCOUNTER — Ambulatory Visit: Payer: Medicare HMO | Admitting: Podiatry

## 2016-01-11 ENCOUNTER — Encounter: Payer: Self-pay | Admitting: Podiatry

## 2016-01-11 ENCOUNTER — Ambulatory Visit (INDEPENDENT_AMBULATORY_CARE_PROVIDER_SITE_OTHER): Payer: Medicare HMO | Admitting: Podiatry

## 2016-01-11 ENCOUNTER — Encounter: Payer: Self-pay | Admitting: Family Medicine

## 2016-01-11 ENCOUNTER — Other Ambulatory Visit: Payer: Self-pay | Admitting: Family Medicine

## 2016-01-11 ENCOUNTER — Ambulatory Visit (INDEPENDENT_AMBULATORY_CARE_PROVIDER_SITE_OTHER): Payer: Medicare HMO | Admitting: Family Medicine

## 2016-01-11 VITALS — BP 190/94 | HR 61 | Resp 16

## 2016-01-11 VITALS — BP 210/60 | HR 55 | Ht 61.0 in | Wt 113.2 lb

## 2016-01-11 DIAGNOSIS — I10 Essential (primary) hypertension: Secondary | ICD-10-CM

## 2016-01-11 DIAGNOSIS — B351 Tinea unguium: Secondary | ICD-10-CM

## 2016-01-11 DIAGNOSIS — M79674 Pain in right toe(s): Secondary | ICD-10-CM

## 2016-01-11 DIAGNOSIS — M79675 Pain in left toe(s): Secondary | ICD-10-CM | POA: Diagnosis not present

## 2016-01-11 LAB — BASIC METABOLIC PANEL WITH GFR
BUN: 18 mg/dL (ref 7–25)
CO2: 24 mmol/L (ref 20–31)
Calcium: 10 mg/dL (ref 8.6–10.4)
Chloride: 103 mmol/L (ref 98–110)
Creat: 1.12 mg/dL — ABNORMAL HIGH (ref 0.50–0.99)
GFR, Est African American: 58 mL/min — ABNORMAL LOW (ref >=60)
GFR, Est Non African American: 50 mL/min — ABNORMAL LOW (ref >=60)
Glucose, Bld: 122 mg/dL — ABNORMAL HIGH (ref 65–99)
Potassium: 3.9 mmol/L (ref 3.5–5.3)
Sodium: 139 mmol/L (ref 135–146)

## 2016-01-11 NOTE — Progress Notes (Signed)
   Subjective:   Mallory Hayes is a 70 y.o. female with a history of HTN, T2DM, HLD here for elevated BP  HTN - Was seen by podiatry earlier today and noted BP 211/98 and sent to PCP office - Feeling weak for 1-2 days - Took lisinopril 20mg  earlier today and usually takes another 20mg  and amlodipine in an hour or so - Denies CP, SOB, vision changes, headaches - Has cardiology appt next week for bradycardia  Review of Systems:  Per HPI.   Social History: former smoker  Objective:  BP (!) 210/60 (BP Location: Right Arm, Patient Position: Sitting, Cuff Size: Normal)   Pulse (!) 55   Ht 5\' 1"  (1.549 m)   Wt 113 lb 3.2 oz (51.3 kg)   SpO2 99%   BMI 21.39 kg/m   Gen:  70 y.o. female in NAD HEENT: NCAT, MMM, EOMI, PERRL, anicteric sclerae, optic disc with clear borders b/l CV: mildly bradycardic, no MRG Resp: Non-labored, CTAB, no wheezes noted Abd: Soft, NTND, BS present, no guarding or organomegaly Ext: WWP, no edema MSK: Full ROM, strength intact Neuro: Alert and oriented, speech normal, gait intact, sensation intact      Chemistry      Component Value Date/Time   NA 139 12/27/2015 1051   K 3.9 12/27/2015 1051   CL 103 12/27/2015 1051   CO2 25 12/27/2015 1051   BUN 20 12/27/2015 1051   CREATININE 1.16 (H) 12/27/2015 1051      Component Value Date/Time   CALCIUM 9.9 12/27/2015 1051   ALKPHOS 72 10/04/2015 1055   AST 22 10/04/2015 1055   ALT 17 10/04/2015 1055   BILITOT 0.4 10/04/2015 1055       Assessment & Plan:     Mallory Hayes is a 70 y.o. female here for   HYPERTENSION, BENIGN SYSTEMIC Hypertensive urgency today with repeated manual BPs of 210/60 Completely asymptomatic currently Increase amlodipine to 10mg  daily and continue lisinopril 40mg  daily BMET today to check creatinine F/u tomorrow    Mallory Crews, MD MPH PGY-3,  [facility] 01/11/2016  4:15 PM

## 2016-01-11 NOTE — Assessment & Plan Note (Signed)
Hypertensive urgency today with repeated manual BPs of 210/60 Completely asymptomatic currently Increase amlodipine to 10mg  daily and continue lisinopril 40mg  daily BMET today to check creatinine F/u tomorrow

## 2016-01-11 NOTE — Patient Instructions (Signed)
Diabetes and Foot Care Diabetes may cause you to have problems because of poor blood supply (circulation) to your feet and legs. This may cause the skin on your feet to become thinner, break easier, and heal more slowly. Your skin may become dry, and the skin may peel and crack. You may also have nerve damage in your legs and feet causing decreased feeling in them. You may not notice minor injuries to your feet that could lead to infections or more serious problems. Taking care of your feet is one of the most important things you can do for yourself.  HOME CARE INSTRUCTIONS  Wear shoes at all times, even in the house. Do not go barefoot. Bare feet are easily injured.  Check your feet daily for blisters, cuts, and redness. If you cannot see the bottom of your feet, use a mirror or ask someone for help.  Wash your feet with warm water (do not use hot water) and mild soap. Then pat your feet and the areas between your toes until they are completely dry. Do not soak your feet as this can dry your skin.  Apply a moisturizing lotion or petroleum jelly (that does not contain alcohol and is unscented) to the skin on your feet and to dry, brittle toenails. Do not apply lotion between your toes.  Trim your toenails straight across. Do not dig under them or around the cuticle. File the edges of your nails with an emery board or nail file.  Do not cut corns or calluses or try to remove them with medicine.  Wear clean socks or stockings every day. Make sure they are not too tight. Do not wear knee-high stockings since they may decrease blood flow to your legs.  Wear shoes that fit properly and have enough cushioning. To break in new shoes, wear them for just a few hours a day. This prevents you from injuring your feet. Always look in your shoes before you put them on to be sure there are no objects inside.  Do not cross your legs. This may decrease the blood flow to your feet.  If you find a minor scrape,  cut, or break in the skin on your feet, keep it and the skin around it clean and dry. These areas may be cleansed with mild soap and water. Do not cleanse the area with peroxide, alcohol, or iodine.  When you remove an adhesive bandage, be sure not to damage the skin around it.  If you have a wound, look at it several times a day to make sure it is healing.  Do not use heating pads or hot water bottles. They may burn your skin. If you have lost feeling in your feet or legs, you may not know it is happening until it is too late.  Make sure your health care provider performs a complete foot exam at least annually or more often if you have foot problems. Report any cuts, sores, or bruises to your health care provider immediately. SEEK MEDICAL CARE IF:   You have an injury that is not healing.  You have cuts or breaks in the skin.  You have an ingrown nail.  You notice redness on your legs or feet.  You feel burning or tingling in your legs or feet.  You have pain or cramps in your legs and feet.  Your legs or feet are numb.  Your feet always feel cold. SEEK IMMEDIATE MEDICAL CARE IF:   There is increasing redness,   swelling, or pain in or around a wound.  There is a red line that goes up your leg.  Pus is coming from a wound.  You develop a fever or as directed by your health care provider.  You notice a bad smell coming from an ulcer or wound.   This information is not intended to replace advice given to you by your health care provider. Make sure you discuss any questions you have with your health care provider.   Document Released: 04/13/2000 Document Revised: 12/17/2012 Document Reviewed: 09/23/2012 Elsevier Interactive Patient Education 2016 Elsevier Inc.  

## 2016-01-11 NOTE — Patient Instructions (Signed)
Nice to meet you.  Increase Norvasc to 10mg  daily (4 pills). Continue current Lisinopril dose.  If you have any blurred vision, chest pain, shortness of breath, or headache, go to emergency room.  Make an appointment to see any doctor or a nurse visit tomorrow.  Take care, Dr. B   Hypertension Hypertension, commonly called high blood pressure, is when the force of blood pumping through your arteries is too strong. Your arteries are the blood vessels that carry blood from your heart throughout your body. A blood pressure reading consists of a higher number over a lower number, such as 110/72. The higher number (systolic) is the pressure inside your arteries when your heart pumps. The lower number (diastolic) is the pressure inside your arteries when your heart relaxes. Ideally you want your blood pressure below 120/80. Hypertension forces your heart to work harder to pump blood. Your arteries may become narrow or stiff. Having untreated or uncontrolled hypertension can cause heart attack, stroke, kidney disease, and other problems. RISK FACTORS Some risk factors for high blood pressure are controllable. Others are not.  Risk factors you cannot control include:   Race. You may be at higher risk if you are African American.  Age. Risk increases with age.  Gender. Men are at higher risk than women before age 36 years. After age 51, women are at higher risk than men. Risk factors you can control include:  Not getting enough exercise or physical activity.  Being overweight.  Getting too much fat, sugar, calories, or salt in your diet.  Drinking too much alcohol. SIGNS AND SYMPTOMS Hypertension does not usually cause signs or symptoms. Extremely high blood pressure (hypertensive crisis) may cause headache, anxiety, shortness of breath, and nosebleed. DIAGNOSIS To check if you have hypertension, your health care provider will measure your blood pressure while you are seated, with your arm  held at the level of your heart. It should be measured at least twice using the same arm. Certain conditions can cause a difference in blood pressure between your right and left arms. A blood pressure reading that is higher than normal on one occasion does not mean that you need treatment. If it is not clear whether you have high blood pressure, you may be asked to return on a different day to have your blood pressure checked again. Or, you may be asked to monitor your blood pressure at home for 1 or more weeks. TREATMENT Treating high blood pressure includes making lifestyle changes and possibly taking medicine. Living a healthy lifestyle can help lower high blood pressure. You may need to change some of your habits. Lifestyle changes may include:  Following the DASH diet. This diet is high in fruits, vegetables, and whole grains. It is low in salt, red meat, and added sugars.  Keep your sodium intake below 2,300 mg per day.  Getting at least 30-45 minutes of aerobic exercise at least 4 times per week.  Losing weight if necessary.  Not smoking.  Limiting alcoholic beverages.  Learning ways to reduce stress. Your health care provider may prescribe medicine if lifestyle changes are not enough to get your blood pressure under control, and if one of the following is true:  You are 21-29 years of age and your systolic blood pressure is above 140.  You are 21 years of age or older, and your systolic blood pressure is above 150.  Your diastolic blood pressure is above 90.  You have diabetes, and your systolic blood pressure is  over 334 or your diastolic blood pressure is over 90.  You have kidney disease and your blood pressure is above 140/90.  You have heart disease and your blood pressure is above 140/90. Your personal target blood pressure may vary depending on your medical conditions, your age, and other factors. HOME CARE INSTRUCTIONS  Have your blood pressure rechecked as directed  by your health care provider.   Take medicines only as directed by your health care provider. Follow the directions carefully. Blood pressure medicines must be taken as prescribed. The medicine does not work as well when you skip doses. Skipping doses also puts you at risk for problems.  Do not smoke.   Monitor your blood pressure at home as directed by your health care provider. SEEK MEDICAL CARE IF:   You think you are having a reaction to medicines taken.  You have recurrent headaches or feel dizzy.  You have swelling in your ankles.  You have trouble with your vision. SEEK IMMEDIATE MEDICAL CARE IF:  You develop a severe headache or confusion.  You have unusual weakness, numbness, or feel faint.  You have severe chest or abdominal pain.  You vomit repeatedly.  You have trouble breathing. MAKE SURE YOU:   Understand these instructions.  Will watch your condition.  Will get help right away if you are not doing well or get worse.   This information is not intended to replace advice given to you by your health care provider. Make sure you discuss any questions you have with your health care provider.   Document Released: 04/16/2005 Document Revised: 08/31/2014 Document Reviewed: 02/06/2013 Elsevier Interactive Patient Education [employer].

## 2016-01-12 ENCOUNTER — Ambulatory Visit (INDEPENDENT_AMBULATORY_CARE_PROVIDER_SITE_OTHER): Payer: Medicare HMO | Admitting: Family Medicine

## 2016-01-12 ENCOUNTER — Encounter: Payer: Self-pay | Admitting: Family Medicine

## 2016-01-12 DIAGNOSIS — I10 Essential (primary) hypertension: Secondary | ICD-10-CM | POA: Diagnosis not present

## 2016-01-12 MED ORDER — AMLODIPINE BESYLATE 2.5 MG PO TABS: 2.5000 mg | ORAL_TABLET | Freq: Every day | ORAL | 3 refills | Status: DC

## 2016-01-12 NOTE — Progress Notes (Signed)
    Subjective:  Mallory Hayes is a 70 y.o. female who presents to the [facility] today with a chief complaint of blood pressure follow-up.   HPI: Avarose was seen yesterday in clinic for hypertensive urgency and at that time her systolic blood pressure was 210. She denied any headache changes in vision, chest pain or shortness of breath. Plan was to increase norvasc to 10 mg daily and to continue with lisinopril 40 mg daily. She was asked to come back in today for evaluation.    Hypertension Today she also denies any headache, changes in vision, chest pain or shortness of breath.  Patient had BMP checked last visit which showed creatinine of 1.12 which is elevated but within her normal limits.  Pt did not take norvasc today unfortunately because she was worried that she would get dizzy.  She only took 40mg  lisinopril.   PMH: Hypertension and type 2 diabetes ROS: see HPI SH: Former smoker  Objective:  Physical Exam: BP (!) 187/62   Pulse (!) 58   Temp 98 F (36.7 C) (Oral)   Ht 5\' 1"  (1.549 m)   Wt 112 lb 3.2 oz (50.9 kg)   BMI 21.20 kg/m   Gen: 70 yo F in NAD, resting comfortably CV: RRR with no murmurs appreciated Pulm: NWOB, CTAB with no crackles, wheezes, or rhonchi GI: Normal bowel sounds present. Soft, Nontender, Nondistended. MSK: no edema, cyanosis, or clubbing noted Skin: warm, dry Neuro: grossly normal, moves all extremities Psych: Normal affect and thought content  Results for orders placed or performed in visit on 01/11/16 (from the past 72 hour(s))  BASIC METABOLIC PANEL WITH GFR     Status: Abnormal   Collection Time: 01/11/16  4:12 PM  Result Value Ref Range   Sodium 139 135 - 146 mmol/L   Potassium 3.9 3.5 - 5.3 mmol/L   Chloride 103 98 - 110 mmol/L   CO2 24 20 - 31 mmol/L   Glucose, Bld 122 (H) 65 - 99 mg/dL   BUN 18 7 - 25 mg/dL   Creat 1.12 (H) 0.50 - 0.99 mg/dL    Comment:   For patients > or = 69 years of age: The upper reference limit for Creatinine is  approximately 13% higher for people identified as African-American.      Calcium 10.0 8.6 - 10.4 mg/dL   GFR, Est African American 58 (L) >=60 mL/min   GFR, Est Non African American 50 (L) >=60 mL/min     Assessment/Plan:  HYPERTENSION, BENIGN SYSTEMIC Blood pressure today was 187/62 which is down from 210/60 from yesterday and continues to be asymptomatic.  She did not take her Norvasc today because she was worried about dizziness while she would drive to the clinic for her visit.  Recommended that she takes 20 mg lisinopril and 5 mg Norvasc in the morning and then the same in the afternoon.  Refilled Norvasc today. She declined flu shot.  - She will follow up tomorrow with a nursing visit to check her blood pressure

## 2016-01-12 NOTE — Patient Instructions (Addendum)
You came in today for reevaluation of your blood pressure.  You were quite high yesterday and unfortunately still high today.  Please take 20mg  lisinopril and 5mg  norvasc tomorrow morning and then again in the afternoon. I suspect that your blood pressure will be much improved at your next visit.   Please be seen urgently if you develop severe headache, chest pain, shortness of breath or changes in vision associated with increases in your blood pressure.   Take care, Mallory Hayes L. Rosalyn Gess, [place] Resident PGY-1 01/12/2016 2:29 PM

## 2016-01-12 NOTE — Assessment & Plan Note (Addendum)
Blood pressure today was 187/62 which is down from 210/60 from yesterday and continues to be asymptomatic.  She did not take her Norvasc today because she was worried about dizziness while she would drive to the clinic for her visit.  Recommended that she takes 20 mg lisinopril and 5 mg Norvasc in the morning and then the same in the afternoon.  Refilled Norvasc today. She declined flu shot.  - She will follow up tomorrow with a nursing visit to check her blood pressure

## 2016-01-12 NOTE — Progress Notes (Signed)
Patient ID: Mallory Hayes, female   DOB: [birth year], 70 y.o.   MRN: [id number]    Subjective: This patient presents for a scheduled visit complaining of thickened and elongated toenails and walking wearing shoes and requests toenail debridement. She has a diabetic without any history of foot complications  Chart review dated 06/14/2015 Dr. Prudence Davidson Vascular status palpable pulses Sensation normal tactile sensation bilaterally Nail exam thickened disfigured nails Ulcer exam no evidence of ulcer Orthopedic exam muscle tone sent within normal limits  Exam today No open skin lesions bilaterally HAV left Bunionette's bilaterally The toenails are elongated, brittle, deformity tender to direct palpation 6-10  Assessment: Diabetic without foot complications Symptomatic onychomycoses 6-10  Plan: Debridement toenails 6-10 mechanically and electrically without a bleeding  Reappoint 3 months

## 2016-01-13 ENCOUNTER — Ambulatory Visit (INDEPENDENT_AMBULATORY_CARE_PROVIDER_SITE_OTHER): Payer: Medicare HMO | Admitting: *Deleted

## 2016-01-13 VITALS — BP 170/70 | HR 60

## 2016-01-13 DIAGNOSIS — Z136 Encounter for screening for cardiovascular disorders: Secondary | ICD-10-CM

## 2016-01-13 DIAGNOSIS — I10 Essential (primary) hypertension: Secondary | ICD-10-CM

## 2016-01-13 DIAGNOSIS — Z0130 Encounter for examination of blood pressure without abnormal findings: Secondary | ICD-10-CM

## 2016-01-13 MED ORDER — AMLODIPINE BESYLATE 2.5 MG PO TABS: 5.0000 mg | ORAL_TABLET | Freq: Two times a day (BID) | ORAL | 3 refills | Status: DC

## 2016-01-13 MED ORDER — HYDROCHLOROTHIAZIDE 12.5 MG PO TABS
12.5000 mg | ORAL_TABLET | Freq: Every day | ORAL | 0 refills | Status: DC
Start: 2016-01-13 — End: 2016-02-02

## 2016-01-13 NOTE — Progress Notes (Signed)
   Patient in nurse clinic for blood pressure check.  Patient denies chest pain, headache, dizziness, visual changes, SOB or numbness/tingling of legs/arms/finges.  Patient reported taking both lisinopril 40 mg and Norvasc 10 mg as prescribed.  Precept with Dr. Ardelia Mems; patient to return next week for blood pressure check and lab work.  Will add HCTZ 12.5 mg daily. Patient verbalized understanding. Will forward to PCP.  Today's Vitals   01/13/16 1351 01/13/16 1357  BP: (!) 182/60 (!) 170/70  Pulse: (!) 57 60  SpO2: 98% 98%  PainSc: 0-No pain     Derl Barrow, RN

## 2016-01-19 ENCOUNTER — Ambulatory Visit (INDEPENDENT_AMBULATORY_CARE_PROVIDER_SITE_OTHER): Payer: Medicare HMO | Admitting: *Deleted

## 2016-01-19 ENCOUNTER — Other Ambulatory Visit: Payer: Medicare HMO

## 2016-01-19 VITALS — BP 150/80

## 2016-01-19 DIAGNOSIS — I10 Essential (primary) hypertension: Secondary | ICD-10-CM

## 2016-01-19 LAB — BASIC METABOLIC PANEL WITH GFR
BUN: 22 mg/dL (ref 7–25)
CALCIUM: 10.4 mg/dL (ref 8.6–10.4)
CO2: 23 mmol/L (ref 20–31)
CREATININE: 1.27 mg/dL — AB (ref 0.50–0.99)
Chloride: 97 mmol/L — ABNORMAL LOW (ref 98–110)
GFR, EST AFRICAN AMERICAN: 50 mL/min — AB (ref >=60)
GFR, Est Non African American: 43 mL/min — ABNORMAL LOW (ref >=60)
GLUCOSE: 159 mg/dL — AB (ref 65–99)
Potassium: 3.6 mmol/L (ref 3.5–5.3)
Sodium: 134 mmol/L — ABNORMAL LOW (ref 135–146)

## 2016-01-19 NOTE — Progress Notes (Signed)
Pt is here today for BP recheck and lab draw (BMET).  She is taking her lisinopril: 40mg , Norvasc: 10mg , and HCTZ: 12.5 mg (new medication).  Pt states that she feels much better today.  Denies CP, SOB, or dizziness.  Reported to Dr. Ardelia Mems who is pleased with BP, given that she only took medications 45 minutes ago.  Pt will follow-up in one month with Dr. Ardelia Mems. [place], Salome Spotted, CMA

## 2016-01-20 ENCOUNTER — Encounter: Payer: Self-pay | Admitting: Family Medicine

## 2016-01-25 ENCOUNTER — Encounter: Payer: Self-pay | Admitting: Pulmonary Disease

## 2016-01-25 ENCOUNTER — Ambulatory Visit (INDEPENDENT_AMBULATORY_CARE_PROVIDER_SITE_OTHER): Payer: Medicare HMO | Admitting: Pulmonary Disease

## 2016-01-25 VITALS — BP 132/84 | HR 52 | Ht 61.0 in | Wt 108.4 lb

## 2016-01-25 DIAGNOSIS — G4733 Obstructive sleep apnea (adult) (pediatric): Secondary | ICD-10-CM

## 2016-01-25 NOTE — Progress Notes (Signed)
Past Medical History She  has a past medical history of Diabetes mellitus; GERD (gastroesophageal reflux disease); Hypertension; Sleep apnea; and Urinary incontinence.  Past Surgical History She  has a past surgical history that includes Appendectomy; Cholecystectomy; Colonoscopy (2011); and Partial hysterectomy (1991).  Current Outpatient Prescriptions on File Prior to Visit  Medication Sig  . amLODipine (NORVASC) 2.5 MG tablet Take 2 tablets (5 mg total) by mouth 2 (two) times daily.  Marland Kitchen aspirin 81 MG tablet Take 81 mg by mouth daily.    . calcium citrate-vitamin D 500-400 MG-UNIT chewable tablet Chew 2 tablets by mouth daily.  . hydrochlorothiazide (HYDRODIURIL) 12.5 MG tablet Take 1 tablet (12.5 mg total) by mouth daily.  . hydrocortisone cream 0.5 % Apply 1 application topically 2 (two) times daily as needed for itching.  Marland Kitchen lisinopril (PRINIVIL,ZESTRIL) 20 MG tablet Take 2 tablets (40 mg total) by mouth daily.  . metFORMIN (GLUCOPHAGE-XR) 500 MG 24 hr tablet TAKE 2 TABLETS EVERY DAY  WITH  BREAKFAST  . mometasone (NASONEX) 50 MCG/ACT nasal spray Place 2 sprays into the nose daily. (Patient taking differently: Place 2 sprays into the nose as needed. )  . pantoprazole (PROTONIX) 40 MG tablet Take 1 tablet (40 mg total) by mouth daily. (Patient taking differently: Take 40 mg by mouth as needed. )  . polyethylene glycol powder (GLYCOLAX/MIRALAX) powder MIX 1 CAPFUL (17GM) IN LIQUID AND DRINK EVERY DAY AS NEEDED FOR  CONSTIPATION  . rosuvastatin (CRESTOR) 20 MG tablet Take 1 tablet (20 mg total) by mouth at bedtime.  . vitamin E 200 UNIT capsule Take 200 Units by mouth daily.    Marland Kitchen tolterodine (DETROL LA) 4 MG 24 hr capsule Take 1 capsule (4 mg total) by mouth daily. (Patient not taking: Reported on 01/25/2016)   No current facility-administered medications on file prior to visit.     Chief Complaint  Patient presents with  . Follow-up    Pt. states she is still adjusting to using her CPAP,  Finding it hard to being on it for the full 4 hours, No major problems, DME [facility]    Tests: PSG 07/14/14 >> AHI 6.2  HST 10/12/15 >> AHI 26, SpO2 low 83%  Cardiac tests Echo 06/08/15 >> EF 60%  Vital signs BP 132/84 (BP Location: Right Arm, Cuff Size: Normal)   Pulse (!) 52   Ht 5\' 1"  (1.549 m)   Wt 108 lb 6.4 oz (49.2 kg)   SpO2 98%   BMI 20.48 kg/m   History of Present Illness Mallory Hayes is a 70 y.o. female for evaluation of sleep problems.  She feels like CPAP helps some.  She hasn't gotten used to using it all night.  She is using nasal pillows, and no issue with mask fit.  She does feel like pressure gets too high at times.  Physical Exam:  General - No distress ENT - No sinus tenderness, no oral exudate, no LAN, MP 4, enlarged tongue, slight over bite Cardiac - s1s2 regular, no murmur Chest - No wheeze/rales/dullness Back - No focal tenderness Abd - Soft, non-tender Ext - No edema Neuro - Normal strength Skin - No rashes Psych - Normal mood, and behavior  Assessment/plan:  Obstructive sleep apnea. - advised her to use CPAP for entire time she is asleep to get maximal benefit - will change her to fixed CPAP setting of 6 cm H2O - if CPAP is unsuccessful, then she might be candidate for oral appliance   Patient Instructions  Will have your home care company change your CPAP setting to 6 cm H2O  Follow up in 4 months    Chesley Mires, M.D. Pager [phone] 01/25/2016, 12:31 PM

## 2016-01-25 NOTE — Patient Instructions (Signed)
Will have your home care company change your CPAP setting to 6 cm H2O  Follow up in 4 months

## 2016-01-27 ENCOUNTER — Encounter: Payer: Self-pay | Admitting: Cardiovascular Disease

## 2016-01-27 ENCOUNTER — Ambulatory Visit (INDEPENDENT_AMBULATORY_CARE_PROVIDER_SITE_OTHER): Payer: Medicare HMO | Admitting: Cardiovascular Disease

## 2016-01-27 VITALS — BP 130/82 | HR 49 | Ht 61.0 in | Wt 109.4 lb

## 2016-01-27 DIAGNOSIS — R001 Bradycardia, unspecified: Secondary | ICD-10-CM

## 2016-01-27 DIAGNOSIS — E7800 Pure hypercholesterolemia, unspecified: Secondary | ICD-10-CM

## 2016-01-27 DIAGNOSIS — I10 Essential (primary) hypertension: Secondary | ICD-10-CM | POA: Diagnosis not present

## 2016-01-27 DIAGNOSIS — R011 Cardiac murmur, unspecified: Secondary | ICD-10-CM

## 2016-01-27 NOTE — Progress Notes (Signed)
Cardiology Office Note   Date:  01/27/2016   ID:  Mallory Hayes, DOB October 08, 1945, MRN [id number]  PCP:  Chrisandra Netters, MD  Cardiologist:   Skeet Latch, MD   Chief Complaint  Patient presents with  . New Evaluation    PT C/O LOW HEART RATE       History of Present Illness: Mallory Hayes is a 70 y.o. female hypertension, hyperlipidemia, OSA, and diabetes who presents for an evaluation of bradycardia. Mallory Hayes notes that her heart rate has been low for years.  She saw her PCP recently and was noted that her heart rate was in the 46. She was referred to cardiology for evaluation. Mallory Hayes walks for exercise 35 minutes each day. She gardens and does housework. She feels great with exercise and notes that that is the best she feels all day. She denies chest pain or shortness of breath. She also hasn't noted any lower extremity edema, orthopnea, or PND. She denies any lightheadedness or dizziness.  Every once in while her heart rate dips into the 40s and she does feel weak when this occurs.  She sometimes has episodes of feeling hot, though this occurs at rest and lasts for a couple minutes time.  She was diagnosed with sleep apnea earlier this year and wears her mask faithfully.   Past Medical History:  Diagnosis Date  . Diabetes mellitus   . GERD (gastroesophageal reflux disease)   . Hypertension   . OSA (obstructive sleep apnea)   . Sleep apnea   . Urinary incontinence     Past Surgical History:  Procedure Laterality Date  . APPENDECTOMY    . CHOLECYSTECTOMY    . COLONOSCOPY  2011  . PARTIAL HYSTERECTOMY  1991     Current Outpatient Prescriptions  Medication Sig Dispense Refill  . amLODipine (NORVASC) 2.5 MG tablet Take 2 tablets (5 mg total) by mouth 2 (two) times daily. 120 tablet 3  . aspirin 81 MG tablet Take 81 mg by mouth daily.      . hydrochlorothiazide (HYDRODIURIL) 12.5 MG tablet Take 1 tablet (12.5 mg total) by mouth daily. 30 tablet 0  . hydrocortisone  cream 0.5 % Apply 1 application topically 2 (two) times daily as needed for itching. 30 g 0  . lisinopril (PRINIVIL,ZESTRIL) 20 MG tablet Take 2 tablets (40 mg total) by mouth daily.    . metFORMIN (GLUCOPHAGE-XR) 500 MG 24 hr tablet TAKE 2 TABLETS EVERY DAY  WITH  BREAKFAST 180 tablet 3  . mometasone (NASONEX) 50 MCG/ACT nasal spray Place 2 sprays into the nose daily. (Patient taking differently: Place 2 sprays into the nose as needed. ) 17 g 3  . pantoprazole (PROTONIX) 40 MG tablet Take 1 tablet (40 mg total) by mouth daily. (Patient taking differently: Take 40 mg by mouth as needed. ) 90 tablet 3  . polyethylene glycol powder (GLYCOLAX/MIRALAX) powder MIX 1 CAPFUL (17GM) IN LIQUID AND DRINK EVERY DAY AS NEEDED FOR  CONSTIPATION 1530 g 2  . rosuvastatin (CRESTOR) 20 MG tablet Take 1 tablet (20 mg total) by mouth at bedtime. 90 tablet 3  . vitamin E 200 UNIT capsule Take 200 Units by mouth daily.       No current facility-administered medications for this visit.     Allergies:   Amaryl [glimepiride]; Food; and Sulfamethoxazole    Social History:  The patient  reports that she has never smoked. She has never used smokeless tobacco. She reports that she does not  drink alcohol or use drugs.   Family History:  The patient's family history includes Cancer in her father and mother; Colon cancer (age of onset: 39) in her mother; Diabetes in her brother; Diabetes (age of onset: 2) in her mother; Heart disease (age of onset: 51) in her mother; Hypertension in her mother; Ovarian cancer in her mother; Prostatitis in her brother.    ROS:  Please see the history of present illness.   Otherwise, review of systems are positive for none.   All other systems are reviewed and negative.    PHYSICAL EXAM: VS:  BP 130/82 (BP Location: Right Arm, Patient Position: Sitting, Cuff Size: Normal)   Pulse (!) 49   Ht 5\' 1"  (1.549 m)   Wt 109 lb 6.4 oz (49.6 kg)   BMI 20.67 kg/m  , BMI Body mass index is 20.67  kg/m. GENERAL:  Well appearing HEENT:  Pupils equal round and reactive, fundi not visualized, oral mucosa unremarkable NECK:  No jugular venous distention, waveform within normal limits, carotid upstroke brisk and symmetric, no bruits, no thyromegaly LYMPHATICS:  No cervical adenopathy LUNGS:  Clear to auscultation bilaterally HEART:  Bradycardic.  Regular rhythm.  PMI not displaced or sustained,S1 and S2 within normal limits, no S3, no S4, no clicks, no rubs, no murmurs ABD:  Flat, positive bowel sounds normal in frequency in pitch, no bruits, no rebound, no guarding, no midline pulsatile mass, no hepatomegaly, no splenomegaly EXT:  2 plus pulses throughout, no edema, no cyanosis no clubbing SKIN:  No rashes no nodules NEURO:  Cranial nerves II through XII grossly intact, motor grossly intact throughout PSYCH:  Cognitively intact, oriented to person place and time  EKG:  EKG is ordered today. The ekg ordered today demonstrates sinus bradycardia.  Rate 49 bpm.     Recent Labs: 10/04/2015: ALT 17; Hemoglobin 12.6; Platelets 174; TSH 1.71 01/19/2016: BUN 22; Creat 1.27; Potassium 3.6; Sodium 134    Lipid Panel    Component Value Date/Time   CHOL 150 12/02/2014 0952   TRIG 60 12/02/2014 0952   HDL 82 12/02/2014 0952   CHOLHDL 1.8 12/02/2014 0952   VLDL 12 12/02/2014 0952   LDLCALC 56 12/02/2014 0952   LDLDIRECT 62 07/28/2012 0927      Wt Readings from Last 3 Encounters:  01/27/16 109 lb 6.4 oz (49.6 kg)  01/25/16 108 lb 6.4 oz (49.2 kg)  01/12/16 112 lb 3.2 oz (50.9 kg)      ASSESSMENT AND PLAN:  # Murmur:  Mallory Hayes has a systolic murmur on exam that sounds consistent with mild aortic stenosis.  We will get an echo to better evaluate.  # Hypertension: Blood pressure is well-controlled. Continue amlodipine, HCTZ and lisinopril.  # Bradycardia: Mallory Hayes is relatively asymptomatic. We will continue to monitor at this time. No indication for pacemaker. Avoid nodal  agents.  # Hyperlipidemia: LDL 56 11/2014. Continue rosuvastatin.   Current medicines are reviewed at length with the patient today.  The patient does not have concerns regarding medicines.  The following changes have been made:  no change  Labs/ tests ordered today include:  No orders of the defined types were placed in this encounter.    Disposition:   FU with Demetric Parslow C. Oval Linsey, MD, [facility] in 1 year    This note was written with the assistance of speech recognition software.  Please excuse any transcriptional errors.  Signed, Nefertari Rebman C. Oval Linsey, MD, [facility]  01/27/2016 2:15 PM    [facility]

## 2016-01-27 NOTE — Patient Instructions (Signed)
Medication Instructions:  Your physician recommends that you continue on your current medications as directed. Please refer to the Current Medication list given to you today.  Labwork: none  Testing/Procedures: Your physician has requested that you have an echocardiogram. Echocardiography is a painless test that uses sound waves to create images of your heart. It provides your doctor with information about the size and shape of your heart and how well your heart's chambers and valves are working. This procedure takes approximately one hour. There are no restrictions for this procedure. [address] [address]  Follow-Up: Your physician wants you to follow-up in: [place] will receive a reminder letter in the mail two months in advance. If you don't receive a letter, please call our office to schedule the follow-up appointment.  If you need a refill on your cardiac medications before your next appointment, please call your pharmacy.

## 2016-02-02 ENCOUNTER — Other Ambulatory Visit: Payer: Self-pay | Admitting: Family Medicine

## 2016-02-06 ENCOUNTER — Encounter: Payer: Self-pay | Admitting: Family Medicine

## 2016-02-06 ENCOUNTER — Ambulatory Visit (INDEPENDENT_AMBULATORY_CARE_PROVIDER_SITE_OTHER): Payer: Medicare HMO | Admitting: Family Medicine

## 2016-02-06 DIAGNOSIS — I10 Essential (primary) hypertension: Secondary | ICD-10-CM

## 2016-02-06 DIAGNOSIS — E119 Type 2 diabetes mellitus without complications: Secondary | ICD-10-CM | POA: Diagnosis not present

## 2016-02-06 NOTE — Progress Notes (Signed)
Medical Nutrition Therapy:  Appt start time: 1100 end time:  1200.  Assessment:  Primary concerns today: Blood sugar control and blood pressure control.  Ms. Tolley' BP has been elevated some in the past few weeks, attributable she believes to eating out more often (getting more salt), so lisinopril dosage was increased to 20 mg/day per Dr. Ardelia Mems and amlodipine to 10 mg/day per Dr. Brita Romp.    A1c on 8/29 was reduced to 6.9, down from 7.7 in April.  We spent today's visit discussing ways in which she can lower her BP.  Usual physical activity has been consistent - daily stretching, ~10 pushups, and 30 min walking 5 X wk.    Progress Towards Goal(s):  In progress.   Nutritional Diagnosis:  Some progress on NB-1.1 Food and nutrition-related knowledge deficit As related to meal planning.  As evidenced by reduced A1C.     Intervention:  Nutrition education.   Handouts given during visit include:  AVS  Demonstrated degree of understanding via:  Teach Back   Monitoring/Evaluation:  Dietary intake, exercise, and body weight in 5 week(s).

## 2016-02-06 NOTE — Patient Instructions (Addendum)
To Help Lower Your Blood Pressure: - Limit your total sodium intake to 1500 mg per day.    - For example, ONE Mc[employer] burger has 600 mg of sodium.    - Track your sodium intake daily for the next couple of weeks on the form provided today.  Bring these forms back to your follow-up visit.   - Be sure to get plenty of potassium.  This means to eat a lot of fruits and vegetables.   - Increasing your exercise, i.e., getting up to one hour a day of walking.  This amount of exercise is associated with decreasing blood pressure AND with decreasing inflammation, which is part of the process of hypertension.  (Another benefit: Walking after a meal helps to speed up the lowering of blood sugar.)  - If you make these changes, be aware that your blood pressure could actually become too low.  Symptoms include getting light-headed, especially upon standing.  If so, check with Dr. Ardelia Mems about dosages.    - Congratulations on your lower A1C.  Keep up the good work:  Continue to exercise regularly; more vegetables; less carb!  - Making your own veg soup: Consider adding BARLEY as your carb component.  This tends to have less impact on blood sugar.  (This usually takes about 40 min cooking time.)

## 2016-02-14 ENCOUNTER — Telehealth: Payer: Self-pay | Admitting: *Deleted

## 2016-02-14 NOTE — Telephone Encounter (Signed)
Received fax from [facility] stating Crestor is no longer free.  Please consider changing to Lipitor 10 mg qhs. Please advise.  Derl Barrow, RN

## 2016-02-15 ENCOUNTER — Other Ambulatory Visit: Payer: Self-pay

## 2016-02-15 ENCOUNTER — Ambulatory Visit (HOSPITAL_COMMUNITY): Payer: Medicare HMO | Attending: Cardiology

## 2016-02-15 DIAGNOSIS — R011 Cardiac murmur, unspecified: Secondary | ICD-10-CM | POA: Insufficient documentation

## 2016-02-16 ENCOUNTER — Telehealth: Payer: Self-pay | Admitting: Pulmonary Disease

## 2016-02-16 ENCOUNTER — Telehealth: Payer: Self-pay | Admitting: Family Medicine

## 2016-02-16 DIAGNOSIS — I10 Essential (primary) hypertension: Secondary | ICD-10-CM

## 2016-02-16 MED ORDER — LISINOPRIL 20 MG PO TABS: 40.0000 mg | ORAL_TABLET | Freq: Every day | ORAL | 1 refills | Status: DC

## 2016-02-16 NOTE — Telephone Encounter (Signed)
Red team, can you let her know I sent it in? Thanks [name], MD

## 2016-02-16 NOTE — Telephone Encounter (Signed)
Patient asks PCP refill for Lisinopril 20 mg tablets because she went to [facility] ([place] and they told Patient there aren't authorized refills.Patient has only one pill left. Please, follow up.

## 2016-02-16 NOTE — Telephone Encounter (Signed)
Called spoke with pt. She states that she received a message from [facility] stating that she was not complaint and that she would have to pay an amount of $100-200.00. I explained to her that we would contact [facility] tomorrow as their office is closed at this time. She voiced understanding and had no further questions.   Will hold in triage

## 2016-02-16 NOTE — Telephone Encounter (Signed)
LMOVM for pt to call us back. Marsheila Alejo, CMA  

## 2016-02-16 NOTE — Telephone Encounter (Signed)
Rx sent in. Thanks! Brittany J McIntyre, MD  

## 2016-02-17 NOTE — Telephone Encounter (Signed)
Pt callback stating she was leaving to return the CPAP machine to avoid being charged. Pt can be reached at [phone] or [phone].Mallory Hayes

## 2016-02-17 NOTE — Telephone Encounter (Signed)
Tried calling, no answer, no voicemail. 

## 2016-02-17 NOTE — Telephone Encounter (Signed)
LMTCB

## 2016-02-20 ENCOUNTER — Other Ambulatory Visit: Payer: Self-pay | Admitting: Family Medicine

## 2016-02-20 DIAGNOSIS — E7800 Pure hypercholesterolemia, unspecified: Secondary | ICD-10-CM

## 2016-02-20 MED ORDER — ROSUVASTATIN CALCIUM 20 MG PO TABS: 20.0000 mg | ORAL_TABLET | Freq: Every day | ORAL | 3 refills | Status: DC

## 2016-02-20 MED ORDER — ATORVASTATIN CALCIUM 20 MG PO TABS: 20.0000 mg | ORAL_TABLET | Freq: Every day | ORAL | 3 refills | Status: DC

## 2016-02-20 NOTE — Telephone Encounter (Signed)
Patient asks PCP refill for Crest[facility] 20 mg [facility] [address] because [facility] doesn't carry the medicine any more. Patient doesn't have any pill. Please, follow up ASAP.

## 2016-02-20 NOTE — Telephone Encounter (Signed)
Please inform patient that new rx has been sent in for lipitor (atorvastatin) 20mg  daily.  This was [facility]ed [facility]  Thanks [name], MD

## 2016-02-20 NOTE — Telephone Encounter (Signed)
Spoke with patient, she states she does not want to change to lipitor. States she is fine with paying for the Crestor and would like to continue this. Would [facility] [address].

## 2016-02-20 NOTE — Telephone Encounter (Signed)
Called patient, no answer, no voicemail. Will try again later

## 2016-02-20 NOTE — Telephone Encounter (Signed)
Rx for crestor sent in to requested pharmacy. Unclear if this will be cost prohibitive- brand name crestor is typically very expensive Please inform patient   Thanks, [name], MD

## 2016-02-20 NOTE — Telephone Encounter (Signed)
Received another request to change Crestor to Lipitor.  Crestor is no longer free.  Please advise.  Derl Barrow, RN

## 2016-02-20 NOTE — Telephone Encounter (Signed)
Spoke with patient and informed her that rx had been sent in, she states she has already picked it up. Expressed gratitude for MD's promptness

## 2016-02-21 ENCOUNTER — Encounter: Payer: Self-pay | Admitting: Family Medicine

## 2016-02-21 ENCOUNTER — Ambulatory Visit (INDEPENDENT_AMBULATORY_CARE_PROVIDER_SITE_OTHER): Payer: Medicare HMO | Admitting: Family Medicine

## 2016-02-21 DIAGNOSIS — E7800 Pure hypercholesterolemia, unspecified: Secondary | ICD-10-CM | POA: Diagnosis not present

## 2016-02-21 DIAGNOSIS — Z23 Encounter for immunization: Secondary | ICD-10-CM

## 2016-02-21 DIAGNOSIS — I10 Essential (primary) hypertension: Secondary | ICD-10-CM

## 2016-02-21 MED ORDER — BLOOD PRESSURE MONITOR AUTOMAT DEVI: 0 refills | Status: DC

## 2016-02-21 NOTE — Patient Instructions (Addendum)
Blood pressure looks good today Continue current medications  Follow up with me in 1 month for diabetes We'll check cholesterol next time when we get your A1c  Go get tetanus shot Flu shot given today  Sent in blood pressure monitor for you - check it about 3 times per week  Be well, Dr. Ardelia Mems

## 2016-02-21 NOTE — Progress Notes (Signed)
Date of Visit: 02/21/2016   HPI:  Patient presents to follow up on blood pressure.  Currently taking norvasc 5mg  twice daily, lisinopril 40mg  daily, HCTZ 12.5mg  daily. Does not check blood pressure at home but would like rx for meter sent in to her pharmacy. Denies chest pain or shortness of breath.   Bump in R groin - has noticed for a while. Wants it looked at. No pain or drainage.   ROS: See HPI.  Bulger: history of hypertension, type 2 diabetes, constipation, hyperlipidemia, GERD, bradycardia, OSA, urge incontinence  PHYSICAL EXAM: BP 138/82   Pulse (!) 49   Temp 97.8 F (36.6 C) (Oral)   Ht 5\' 1"  (1.549 m)   Wt 110 lb (49.9 kg)   BMI 20.78 kg/m  Gen: NAD, pleasant, cooperative HEENT: normocephalic, atraumatic  Heart: regular rate and rhythm, no murmur Lungs: clear to auscultation bilaterally, normal work of breathing  Neuro: alert, grossly nonfocal, speech normal Ext: No appreciable lower extremity edema bilaterally  Skin: small dilated pore in R groin area with blackhead material inside  ASSESSMENT/PLAN:  Health maintenance:  -got eye exam last month - patient will have records sent to [place] -reminded to get tdap (has rx at home) -flu shot given today   HYPERTENSION, BENIGN SYSTEMIC Well controlled. Continue current regimen.   HYPERCHOLESTEROLEMIA Plan for lipid panel next viist  Bump in groin Clearly a small pore with clogged material in it (dilated pore of winer) As it is otherwise symptomatic will just observe for now. If becomes symptomatic in the future could consider small incision to remove clogged material.  FOLLOW UP: Follow up in 1 mo for diabetes (needs A1c & lipids)  [place] J. Ardelia Mems, [place]

## 2016-02-23 NOTE — Assessment & Plan Note (Signed)
Plan for lipid panel next viist

## 2016-02-23 NOTE — Assessment & Plan Note (Signed)
Well-controlled.  Continue current regimen. 

## 2016-03-13 ENCOUNTER — Ambulatory Visit: Payer: Medicare HMO | Admitting: Family Medicine

## 2016-04-10 ENCOUNTER — Ambulatory Visit (INDEPENDENT_AMBULATORY_CARE_PROVIDER_SITE_OTHER): Payer: Medicare HMO | Admitting: Podiatry

## 2016-04-10 ENCOUNTER — Encounter: Payer: Self-pay | Admitting: Podiatry

## 2016-04-10 VITALS — Ht 61.0 in | Wt 110.0 lb

## 2016-04-10 DIAGNOSIS — M79675 Pain in left toe(s): Secondary | ICD-10-CM | POA: Diagnosis not present

## 2016-04-10 DIAGNOSIS — M79674 Pain in right toe(s): Secondary | ICD-10-CM

## 2016-04-10 DIAGNOSIS — B351 Tinea unguium: Secondary | ICD-10-CM | POA: Diagnosis not present

## 2016-04-10 DIAGNOSIS — M79676 Pain in unspecified toe(s): Secondary | ICD-10-CM

## 2016-04-10 NOTE — Progress Notes (Signed)
Patient ID: Mallory Hayes, female   DOB: 01/15/1946, 70 y.o.   MRN: [id number] Complaint:  Visit Type: Patient returns to my office for continued preventative foot care services. Complaint: Patient states" my nails have grown long and thick and become painful to walk and wear shoes" Patient has been diagnosed with DM with no foot complications. The patient presents for preventative foot care services. No changes to ROS  Podiatric Exam: Vascular: dorsalis pedis and posterior tibial pulses are palpable bilateral. Capillary return is immediate. Temperature gradient is WNL. Skin turgor WNL  Sensorium: Normal Semmes Weinstein monofilament test. Normal tactile sensation bilaterally. Nail Exam: Pt has thick disfigured discolored nails with subungual debris noted bilateral entire nail hallux through fifth toenails Ulcer Exam: There is no evidence of ulcer or pre-ulcerative changes or infection. Orthopedic Exam: Muscle tone and strength are WNL. No limitations in general ROM. No crepitus or effusions noted. Foot type and digits show no abnormalities. Bony prominences are unremarkable. Skin: No Porokeratosis. No infection or ulcers  Diagnosis:  Onychomycosis, , Pain in right toe, pain in left toes  Treatment & Plan Procedures and Treatment: Consent by patient was obtained for treatment procedures. The patient understood the discussion of treatment and procedures well. All questions were answered thoroughly reviewed. Debridement of mycotic and hypertrophic toenails, 1 through 5 bilateral and clearing of subungual debris. No ulceration, no infection noted.   Return Visit-Office Procedure: Patient instructed to return to the office for a follow up visit 3 months for continued evaluation and treatment.   Gardiner Barefoot DPM

## 2016-04-11 ENCOUNTER — Ambulatory Visit: Payer: Medicare HMO | Admitting: Podiatry

## 2016-05-15 ENCOUNTER — Ambulatory Visit: Payer: Medicare HMO | Admitting: Family Medicine

## 2016-05-23 LAB — HM DIABETES EYE EXAM

## 2016-05-24 ENCOUNTER — Ambulatory Visit: Payer: Medicare HMO | Admitting: Family Medicine

## 2016-05-25 ENCOUNTER — Ambulatory Visit: Payer: Medicare HMO | Admitting: Pulmonary Disease

## 2016-06-13 ENCOUNTER — Telehealth: Payer: Self-pay | Admitting: Family Medicine

## 2016-06-13 NOTE — Telephone Encounter (Signed)
Pt has had her flu shot. She has taken a cold. She is taken mucinex.  She is still stuffy and achy.  She wants to know if there is something else she should be taking

## 2016-06-15 ENCOUNTER — Encounter: Payer: Self-pay | Admitting: Family Medicine

## 2016-06-15 ENCOUNTER — Ambulatory Visit (INDEPENDENT_AMBULATORY_CARE_PROVIDER_SITE_OTHER): Payer: Medicare PPO | Admitting: Family Medicine

## 2016-06-15 VITALS — BP 130/80 | HR 53 | Temp 98.0°F | Ht 61.0 in | Wt 114.4 lb

## 2016-06-15 DIAGNOSIS — R05 Cough: Secondary | ICD-10-CM | POA: Diagnosis not present

## 2016-06-15 DIAGNOSIS — R059 Cough, unspecified: Secondary | ICD-10-CM

## 2016-06-15 MED ORDER — BENZONATATE 200 MG PO CAPS: 200.0000 mg | ORAL_CAPSULE | Freq: Two times a day (BID) | ORAL | 0 refills | Status: DC | PRN

## 2016-06-15 MED ORDER — IBUPROFEN 600 MG PO TABS: 600.0000 mg | ORAL_TABLET | Freq: Three times a day (TID) | ORAL | 0 refills | Status: DC | PRN

## 2016-06-15 NOTE — Patient Instructions (Signed)
You can take TUMS or Zantac if needed for heartburn symptoms.   It appears that you have a viral upper respiratory infection (cold).  Cold symptoms can last up to 2 weeks.    - Get plenty of rest and drink plenty of fluids. - Try to breathe moist air. Use a humidifier or take a steamy shower. - Consume warm fluids (soup or tea) to provide relief for a stuffy nose and to loosen phlegm. - For nasal stuffiness, try saline nasal spray or a Neti Pot. - For sore throat pain relief: suck on throat lozenges, hard candy or popsicles; gargle with warm salt water (1/4 tsp. salt per 8 oz. of water); and eat soft, bland foods. - Eat a well-balanced diet. If you cannot, ensure you are getting enough nutrients by taking a daily multivitamin. - Avoid dairy products, as they can thicken phlegm. - Avoid alcohol, as it impairs your body's immune system.  CONTACT YOUR DOCTOR IF YOU EXPERIENCE ANY OF THE FOLLOWING: - High fever - Ear pain - Sinus-type headache - Unusually severe cold symptoms - Cough that gets worse while other cold symptoms improve - Flare up of any chronic lung problem, such as asthma - Your symptoms persist longer than 2 weeks

## 2016-06-15 NOTE — Telephone Encounter (Signed)
Returned call to patient She reports flulike symptoms that started 2 day ago on Wednesday. Has cough, nausea, weakness, body aches. Eating & drinking well. No fever that she knows of. Sound like influenza, which is rampant right now in our community Given her age & risk factors would likely benefit from tamiflu. Recommended she come in for an appointment to be evaluated. Appointment scheduled today at 3:30pm with Dr. Lajuana Ripple. Patient will work on finding a ride here.  Mallory Rio, MD

## 2016-06-15 NOTE — Progress Notes (Signed)
   Subjective: CC: cough FSE:LTRVUY Hirschhorn is a 71 y.o. female presenting to clinic today for same day appointment. PCP: Chrisandra Netters, MD Concerns today include:  1. Cough Patient reports that she developed a deep cough on Wednesday.  She reports she started taking Mucinex, which has helped some but not resolved.  She reports feeling a little warmer though denies fevers.  She reports feeling tired/weak and was slightly nauseous this morning.  She reports decreased appetite but has been drinking soup, water and tea.  No NSAIDs.  Denies vomiting, diarrhea, CP, SOB, wheeze, dizziness, hemoptysis.  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    Allergies  Allergen Reactions  . Amaryl [Glimepiride] Anxiety  . Food Itching    Spices, peppers  . Sulfamethoxazole Itching and Rash    REACTION: sneezing   Social Hx reviewed: non smoker. MedHx, current medications and allergies reviewed.  Please see EMR. ROS: Per HPI  Objective: Office vital signs reviewed. BP 130/80 (BP Location: Left Arm, Patient Position: Sitting, Cuff Size: Normal)   Pulse (!) 53   Temp 98 F (36.7 C) (Oral)   Ht 5\' 1"  (1.549 m)   Wt 114 lb 6.4 oz (51.9 kg)   SpO2 99%   BMI 21.62 kg/m   Physical Examination:  General: Awake, alert, well nourished, slightly disheveled, No acute distress HEENT: Normal    Neck: No masses palpated. No lymphadenopathy    Ears: Tympanic membranes intact, normal light reflex, no erythema, no bulging    Eyes: PERRLA, EOMI, sclera white    Nose: nasal turbinates moist, scant nasal discharge, no erythema or purulence appreciated    Throat: moist mucus membranes, no erythema, Mallampati 4. Cardio: regular rate and rhythm, S1S2 heard, no murmurs appreciated Pulm: clear to auscultation bilaterally, no wheezes, rhonchi or rales; normal work of breathing on room air  Assessment/ Plan: 71  y.o. female   1. Cough.  Clinically appears to be a viral URI.  Not consistent with influenza.  She is very concerned about her cough, so will proceed with CXR to evaluate for pna/ other processes.  She is on an ACE-I, if cough persistent could visit trial off of this medication.  Though again, I favor URI at this time. - Home care instructions reviewed, see AVS - Tessalon 200mg  BID prn cough - Tylenol/ Motrin prn pain/ fever - DG Chest 2 View; Future - Will contact with results - Return precautions reviewed - Follow up prn   Janora Norlander, DO PGY-3, [facility]

## 2016-07-10 ENCOUNTER — Ambulatory Visit: Payer: Medicare HMO | Admitting: Podiatry

## 2016-07-20 ENCOUNTER — Other Ambulatory Visit: Payer: Self-pay | Admitting: Family Medicine

## 2016-07-20 DIAGNOSIS — I10 Essential (primary) hypertension: Secondary | ICD-10-CM

## 2016-08-01 ENCOUNTER — Ambulatory Visit (INDEPENDENT_AMBULATORY_CARE_PROVIDER_SITE_OTHER): Payer: Medicare PPO | Admitting: Podiatry

## 2016-08-01 DIAGNOSIS — M79675 Pain in left toe(s): Secondary | ICD-10-CM

## 2016-08-01 DIAGNOSIS — B351 Tinea unguium: Secondary | ICD-10-CM

## 2016-08-01 DIAGNOSIS — M79674 Pain in right toe(s): Secondary | ICD-10-CM

## 2016-08-01 NOTE — Progress Notes (Signed)
Patient ID: Mallory Hayes, female   DOB: 11/23/1945, 71 y.o.   MRN: [id number] Complaint:  Visit Type: Patient returns to my office for continued preventative foot care services. Complaint: Patient states" my nails have grown long and thick and become painful to walk and wear shoes" Patient has been diagnosed with DM with no foot complications. The patient presents for preventative foot care services. No changes to ROS  Podiatric Exam: Vascular: dorsalis pedis and posterior tibial pulses are palpable bilateral. Capillary return is immediate. Temperature gradient is WNL. Skin turgor WNL  Sensorium: Normal Semmes Weinstein monofilament test. Normal tactile sensation bilaterally. Nail Exam: Pt has thick disfigured discolored nails with subungual debris noted bilateral entire nail hallux through fifth toenails Ulcer Exam: There is no evidence of ulcer or pre-ulcerative changes or infection. Orthopedic Exam: Muscle tone and strength are WNL. No limitations in general ROM. No crepitus or effusions noted. HAV  B/L and tailors bunion  B/L Skin: No Porokeratosis. No infection or ulcers  Diagnosis:  Onychomycosis, , Pain in right toe, pain in left toes  Treatment & Plan Procedures and Treatment: Consent by patient was obtained for treatment procedures. The patient understood the discussion of treatment and procedures well. All questions were answered thoroughly reviewed. Debridement of mycotic and hypertrophic toenails, 1 through 5 bilateral and clearing of subungual debris. No ulceration, no infection noted.   Return Visit-Office Procedure: Patient instructed to return to the office for a follow up visit 3 months for continued evaluation and treatment.   Gardiner Barefoot DPM

## 2016-08-04 ENCOUNTER — Other Ambulatory Visit: Payer: Self-pay | Admitting: Family Medicine

## 2016-08-04 DIAGNOSIS — I10 Essential (primary) hypertension: Secondary | ICD-10-CM

## 2016-09-01 ENCOUNTER — Other Ambulatory Visit: Payer: Self-pay | Admitting: Family Medicine

## 2016-10-01 ENCOUNTER — Other Ambulatory Visit: Payer: Self-pay | Admitting: Family Medicine

## 2016-10-01 DIAGNOSIS — I10 Essential (primary) hypertension: Secondary | ICD-10-CM

## 2016-10-03 ENCOUNTER — Telehealth: Payer: Self-pay | Admitting: Family Medicine

## 2016-10-03 NOTE — Telephone Encounter (Signed)
Pt would like to get a mammogram.  The breast center says needs a referral first. Please let pt know when the referral has been sent  Please call pt on her cell phone [phone]

## 2016-11-07 ENCOUNTER — Ambulatory Visit: Payer: Medicare PPO | Admitting: Podiatry

## 2016-11-09 ENCOUNTER — Encounter: Payer: Self-pay | Admitting: Family Medicine

## 2016-11-09 ENCOUNTER — Ambulatory Visit (INDEPENDENT_AMBULATORY_CARE_PROVIDER_SITE_OTHER): Payer: Medicare PPO | Admitting: Family Medicine

## 2016-11-09 VITALS — BP 140/68 | HR 45 | Temp 98.1°F | Ht 61.0 in | Wt 110.6 lb

## 2016-11-09 DIAGNOSIS — Z1231 Encounter for screening mammogram for malignant neoplasm of breast: Secondary | ICD-10-CM

## 2016-11-09 DIAGNOSIS — E1169 Type 2 diabetes mellitus with other specified complication: Secondary | ICD-10-CM

## 2016-11-09 DIAGNOSIS — L989 Disorder of the skin and subcutaneous tissue, unspecified: Secondary | ICD-10-CM

## 2016-11-09 DIAGNOSIS — R319 Hematuria, unspecified: Secondary | ICD-10-CM

## 2016-11-09 DIAGNOSIS — I10 Essential (primary) hypertension: Secondary | ICD-10-CM

## 2016-11-09 DIAGNOSIS — Z1239 Encounter for other screening for malignant neoplasm of breast: Secondary | ICD-10-CM

## 2016-11-09 DIAGNOSIS — E7800 Pure hypercholesterolemia, unspecified: Secondary | ICD-10-CM

## 2016-11-09 LAB — POCT URINALYSIS DIP (MANUAL ENTRY)
BILIRUBIN UA: NEGATIVE
BILIRUBIN UA: NEGATIVE mg/dL
GLUCOSE UA: NEGATIVE mg/dL
Leukocytes, UA: NEGATIVE
Nitrite, UA: NEGATIVE
Protein Ur, POC: NEGATIVE mg/dL
SPEC GRAV UA: 1.01 (ref 1.010–1.025)
Urobilinogen, UA: 0.2 E.U./dL
pH, UA: 6.5 (ref 5.0–8.0)

## 2016-11-09 LAB — POCT GLYCOSYLATED HEMOGLOBIN (HGB A1C): Hemoglobin A1C: 8.4

## 2016-11-09 MED ORDER — AMLODIPINE BESYLATE 5 MG PO TABS: 5.0000 mg | ORAL_TABLET | Freq: Two times a day (BID) | ORAL | 3 refills | Status: DC

## 2016-11-09 NOTE — Patient Instructions (Addendum)
It was great to see you again today!  I am referring you to a dermatologist for the spot in your groin. You will get a phone call to schedule this appointment.   Switch amlodipine to 5mg  twice daily.   Checking urine to see if there is blood in the urine  See the handout on how to schedule your mammogram. This is an important test to screen for breast cancer.   Try switching metformin to twice daily.  For diabetes: Call Dr. Jenne Campus (our nutritionist) to set up an appointment. Her phone number is: [phone].   See me in 3 months   Be well, Dr. Ardelia Mems

## 2016-11-09 NOTE — Assessment & Plan Note (Addendum)
Less controlled than previously with A1c of 8.4. Patient believes this is due to dietary changes. She is motivated to meet with Dr. Jenne Campus for ongoing nutritional counseling. Advised that we should continue present dose of metformin in light of her elevated A1c, she is agreeable to this. Will have her do 1 tablet of metformin XR 500 mg twice a day rather than all at once to see if this helps.  Cardiac: on aspirin, statin Renal: on ACE Eye: UTD on eye exam Foot: normal exam today Immunizations: UTD except for Tdap, address at next office visit

## 2016-11-09 NOTE — Assessment & Plan Note (Signed)
Return for fasting lipids & CMET, titrate statin as needed based on results.

## 2016-11-09 NOTE — Addendum Note (Signed)
Addended by: [name] on: 11/09/2016 03:23 PM   Modules accepted: Orders

## 2016-11-09 NOTE — Progress Notes (Signed)
Date of Visit: 11/09/2016   HPI:  Patient presents today for routine follow up.  Diabetes: Currently taking metformin XR 1000 mg daily. She would like to switch back to a lower dose of metformin if possible as it does give her some stomach upset. She saw her eye doctor, Dr. Katy Hayes, after she developed a floater in her right eye. She reports that he told her it was normal, and there is nothing more to do. The floater has continued for the last 7 months.  Hypertension: Appears to be taking amlodipine 5 mg twice a day, HCTZ 12.5 mg daily, and lisinopril 40 mg daily. Because she does not know the names of her medications it is unclear to me if she is truly taking this regimen. She denies any chest pain, shortness of breath, or lower extremity edema.  Hematuria: Reports she was seen in urgent care in the last year and was told she had some blood in her urine. Last urinalysis here in our system was 4 years ago. She would like this rechecked today.  Spot in groin: Wants me to recheck the skin lesion in her right inner thigh/groin. She is nervous about this and would like to be referred to a dermatologist to have evaluated.  Stress: has been under more stress lately as her brother is sick with prostate cancer and is receiving hospice services. Thinks she is overall coping well. Denies SI/HI.  Of note, patient did not bring her medications with her today, and does not know the names of the meds she takes. She describes them by size and color. She would like to have the pharmacy clinic visit for medication reconciliation.  ROS: See HPI.  [place]: history of type 2 diabetes, hyperlipidemia, hypertension, GERD, constipation  PHYSICAL EXAM: BP 140/68   Pulse (!) 45   Temp 98.1 F (36.7 C) (Oral)   Ht 5\' 1"  (1.549 m)   Wt 110 lb 9.6 oz (50.2 kg)   SpO2 99%   BMI 20.90 kg/m   Gen: no acute distress, pleasant, cooperative, well appearing HEENT: normocephalic, atraumatic, moist mucous membranes  Heart:  regular rate and rhythm, no murmur Lungs: clear to auscultation bilaterally normal work of breathing  Neuro: alert, grossly nonfocal, speech normal Ext: No appreciable lower extremity edema bilaterally  Diabetic foot exam: 2+ DP pulses bilat, normal monofilament testing bilaterally. No lesions or significant calluses.  Psych: normal range of affect, well groomed, speech normal in rate and volume, normal eye contact  Skin: small black head R inner thigh without drainage or surrounding erythema  ASSESSMENT/PLAN:  Health maintenance:  -foot exam performed today, normal -mammogram ordered & patient instructed to call & schedule (unclear why, but patient reports she needed some kind of referral and was previously unable to schedule mammogram herself) -intended to give Tdap rx but forgot this visit - will do at future visit  Type 2 diabetes mellitus Less controlled than previously with A1c of 8.4. Patient believes this is due to dietary changes. She is motivated to meet with Dr. Jenne Hayes for ongoing nutritional counseling. Advised that we should continue present dose of metformin in light of her elevated A1c, she is agreeable to this. Will have her do 1 tablet of metformin XR 500 mg twice a day rather than all at once to see if this helps.  Cardiac: on aspirin, statin Renal: on ACE Eye: UTD on eye exam Foot: normal exam today Immunizations: UTD except for Tdap, address at next office visit    HYPERTENSION,  BENIGN SYSTEMIC Well controlled. Continue current regimen.  Patient to return for pharmacy clinic visit to do medication reconciliation. I will also change her amlodipine tablets to 5 mg twice a day rather than her taking two 2.5 mg tablets twice a day. Return for fasting CMET & lipids.  HYPERCHOLESTEROLEMIA Return for fasting lipids & CMET, titrate statin as needed based on results.  Skin lesion - benign appearing blackhead in R groin/inner thigh. Advised I don't think further workup is  necessary, but patient is nervous and would like to have this evaluated by dermatologist. Reasonable. I will enter referral so she can have peace of mind.  Stress - coping well, does not seem to have any resurgence of depressive symptoms. Offered supportive listening ear.  Hematuria - reported by patient. Check UA to eval for presence of blood in urine.  FOLLOW UP: Follow up in 3 mos for diabetes Schedule with Dr. Jenne Hayes Referring to dermatology  Mallory Hayes. Mallory Hayes, Mallory Hayes

## 2016-11-09 NOTE — Assessment & Plan Note (Addendum)
Well controlled. Continue current regimen.  Patient to return for pharmacy clinic visit to do medication reconciliation. I will also change her amlodipine tablets to 5 mg twice a day rather than her taking two 2.5 mg tablets twice a day. Return for fasting CMET & lipids.

## 2016-11-15 ENCOUNTER — Ambulatory Visit (INDEPENDENT_AMBULATORY_CARE_PROVIDER_SITE_OTHER): Payer: Medicare PPO | Admitting: Pharmacist

## 2016-11-15 ENCOUNTER — Encounter: Payer: Self-pay | Admitting: Pharmacist

## 2016-11-15 ENCOUNTER — Encounter: Payer: Self-pay | Admitting: Family Medicine

## 2016-11-15 VITALS — BP 160/57 | HR 60 | Wt 108.4 lb

## 2016-11-15 DIAGNOSIS — E7800 Pure hypercholesterolemia, unspecified: Secondary | ICD-10-CM | POA: Diagnosis not present

## 2016-11-15 DIAGNOSIS — E1169 Type 2 diabetes mellitus with other specified complication: Secondary | ICD-10-CM

## 2016-11-15 DIAGNOSIS — I10 Essential (primary) hypertension: Secondary | ICD-10-CM

## 2016-11-15 DIAGNOSIS — K5909 Other constipation: Secondary | ICD-10-CM

## 2016-11-15 MED ORDER — METFORMIN HCL ER 500 MG PO TB24: 1000.0000 mg | ORAL_TABLET | Freq: Two times a day (BID) | ORAL | 2 refills | Status: DC

## 2016-11-15 MED ORDER — POLYETHYLENE GLYCOL 3350 17 GM/SCOOP PO POWD: 17.0000 g | Freq: Every day | ORAL | 2 refills | Status: DC | PRN

## 2016-11-15 MED ORDER — AMLODIPINE BESYLATE 2.5 MG PO TABS: 2.5000 mg | ORAL_TABLET | Freq: Two times a day (BID) | ORAL | 1 refills | Status: DC

## 2016-11-15 MED ORDER — ROSUVASTATIN CALCIUM 20 MG PO TABS: 20.0000 mg | ORAL_TABLET | Freq: Every day | ORAL | 2 refills | Status: DC

## 2016-11-15 MED ORDER — EMPAGLIFLOZIN 10 MG PO TABS: 10.0000 mg | ORAL_TABLET | Freq: Every day | ORAL | 1 refills | Status: DC

## 2016-11-15 NOTE — Patient Instructions (Addendum)
Continue Metformin XR 1000 mg (two 500 mg tablets) twice daily  Start Jardiance (empagliflozin) 10 mg daily (sent this one to [facility])   Decrease amlodipine to 2.5 mg twice daily   Followup with Dr Ardelia Mems in 2-3 weeks and Dr Valentina Lucks in 6 weeks

## 2016-11-15 NOTE — Progress Notes (Signed)
Patient ID: Mallory Hayes, female   DOB: 12/24/1945, 71 y.o.   MRN: [id number] Reviewed: Agree with Dr. Graylin Shiver documentation and management.

## 2016-11-15 NOTE — Progress Notes (Signed)
S:     Chief Complaint  Patient presents with  . Medication Management    Diabetes, Hypertension   Patient arrives in good spirits ambulating without assistance.  Presents for diabetes evaluation, education, and management at the request of Dr Ardelia Mems. Patient was referred on 11/09/16.  Patient was last seen by Primary Care Provider on 11/09/16.   Today she states she does not like the Amlodipine because it makes her pee at night.  She reports she was up all night going to the restroom after taking the amlodipine 5 mg BID.  She reports her main side effect is weakness but her "muscles" on the side of her head are tightening up.  She believes her 2 pills of metformin are too much.    She does have some stress in her life with her brother being very sick.  She has been going back and forth to [place] to visit him.  Patient denies signs/symptoms of orthostasis (dizziness, lightheadedness).   Family History: Father Brain Tumor, Mother Ovarian cancer, Grandfather (MI)   Patient reports adherence with medications.  Current diabetes medications include: metformin XR 1000 mg QAM (two 500 mg tablets)  Current hypertension medications include:  amlodipine 2.5 mg BID (prescribed 5 mg BID), lisinopril 20 mg BID, HCTZ 12.5 mg daily   Patient denies hypoglycemic events.  She does not currently check her CBGs at home.   Patient reported dietary habits:  Coleslaw with hotdogs.  Patient reports she eats high sodium foods occasionally  Patient reports she has been "eating crazy." was eating a lot of chinese food.  She has a visit with Dr Jenne Campus coming up.   Patient reported exercise habits: Patient is active with gardening/church but has not been as active with her brother being sick   Patient reports nocturia 3-4x/night.   O:  Physical Exam  Constitutional: She appears well-developed and well-nourished.  Musculoskeletal: She exhibits no edema.  Vitals reviewed.  Review of Systems    Constitutional: Negative.     Lab Results  Component Value Date   HGBA1C 8.4 11/09/2016   Lipid Panel     Component Value Date/Time   CHOL 150 12/02/2014 0952   TRIG 60 12/02/2014 0952   HDL 82 12/02/2014 0952   CHOLHDL 1.8 12/02/2014 0952   VLDL 12 12/02/2014 0952   LDLCALC 56 12/02/2014 0952   LDLDIRECT 62 07/28/2012 0927   Vitals:   11/15/16 1130 11/15/16 1140  BP: (!) 139/54 (!) 160/57  Pulse: 61 60   A/P: Diabetes longstanding diagnosed currently above gaol. Patient denies hypoglycemic events and is able to verbalize appropriate hypoglycemia management plan. Patient reports adherence with medication. Control is suboptimal due to diet, stress from brother being sick, and being more sedentary. -Continue Metformin XR 1000 mg (two 500 mg tablets) twice daily  -Start Jardiance (empagliflozin) 10 mg daily.  Discussed efficacy and potential side effects of medication  ASCVD risk greater than 7.5%. Continued Aspirin 81 mg and Continued rosuvastatin 20 mg daily.   Hypertension longstanding diagnosed currently controlled with wide pulse pressure.  Patient reports adherence with medication except she has not been taking the amlodipine as prescribed due to nocturia. -Decrease amlodipine to 2.5 mg twice daily  -Continue other medications.  Discussed in depth about how her nocturia is likely related to uncontrolled diabetes vs side effects of amlodipine.   -BMET obtained today  Written patient instructions provided.  Total time in face to face counseling 40 minutes.   Follow up in  Pharmacist Clinic Visit in 6 weeks and Dr Ardelia Mems in 2-3 weeks.   Patient seen with Bennye Alm, PharmD, BCPS

## 2016-11-16 LAB — BASIC METABOLIC PANEL
BUN / CREAT RATIO: 17 (ref 12–28)
BUN: 19 mg/dL (ref 8–27)
CALCIUM: 10.5 mg/dL — AB (ref 8.7–10.3)
CO2: 25 mmol/L (ref 20–29)
Chloride: 97 mmol/L (ref 96–106)
Creatinine, Ser: 1.09 mg/dL — ABNORMAL HIGH (ref 0.57–1.00)
GFR, EST AFRICAN AMERICAN: 59 mL/min/{1.73_m2} — AB (ref >59)
GFR, EST NON AFRICAN AMERICAN: 52 mL/min/{1.73_m2} — AB (ref >59)
Glucose: 113 mg/dL — ABNORMAL HIGH (ref 65–99)
Potassium: 3.7 mmol/L (ref 3.5–5.2)
Sodium: 138 mmol/L (ref 134–144)

## 2016-11-27 ENCOUNTER — Telehealth: Payer: Self-pay | Admitting: Family Medicine

## 2016-11-27 NOTE — Telephone Encounter (Signed)
Attempted to reach patient regarding lab results No answer, LVM on mobile # asking her to call back. No answer on home #.  When she calls back please let me know so that I can speak with her.  Summary of what I plan to say: - calcium level mildly high, might be due to HCTZ - needs fasting lipids & CMET anyways, so we will just recheck her calcium level at a fasting lab appointment. She should schedule this.  Thanks [name], MD

## 2016-11-29 ENCOUNTER — Ambulatory Visit (INDEPENDENT_AMBULATORY_CARE_PROVIDER_SITE_OTHER): Payer: Medicare PPO | Admitting: Podiatry

## 2016-11-29 ENCOUNTER — Encounter: Payer: Self-pay | Admitting: Podiatry

## 2016-11-29 DIAGNOSIS — M79676 Pain in unspecified toe(s): Secondary | ICD-10-CM | POA: Diagnosis not present

## 2016-11-29 DIAGNOSIS — E119 Type 2 diabetes mellitus without complications: Secondary | ICD-10-CM

## 2016-11-29 DIAGNOSIS — B351 Tinea unguium: Secondary | ICD-10-CM | POA: Diagnosis not present

## 2016-11-29 NOTE — Progress Notes (Signed)
Patient ID: Mallory Hayes, female   DOB: Oct 25, 1945, 71 y.o.   MRN: [id number] Complaint:  Visit Type: Patient returns to my office for continued preventative foot care services. Complaint: Patient states" my nails have grown long and thick and become painful to walk and wear shoes" Patient has been diagnosed with DM with no foot complications. The patient presents for preventative foot care services. No changes to ROS  Podiatric Exam: Vascular: dorsalis pedis and posterior tibial pulses are palpable bilateral. Capillary return is immediate. Temperature gradient is WNL. Skin turgor WNL  Sensorium: Normal Semmes Weinstein monofilament test. Normal tactile sensation bilaterally. Nail Exam: Pt has thick disfigured discolored nails with subungual debris noted bilateral entire nail hallux through fifth toenails Ulcer Exam: There is no evidence of ulcer or pre-ulcerative changes or infection. Orthopedic Exam: Muscle tone and strength are WNL. No limitations in general ROM. No crepitus or effusions noted. HAV  B/L and tailors bunion  B/L Skin: No Porokeratosis. No infection or ulcers  Diagnosis:  Onychomycosis, , Pain in right toe, pain in left toes  Treatment & Plan Procedures and Treatment: Consent by patient was obtained for treatment procedures. The patient understood the discussion of treatment and procedures well. All questions were answered thoroughly reviewed. Debridement of mycotic and hypertrophic toenails, 1 through 5 bilateral and clearing of subungual debris. No ulceration, no infection noted.   Return Visit-Office Procedure: Patient instructed to return to the office for a follow up visit 3 months for continued evaluation and treatment.   Gardiner Barefoot DPM

## 2016-12-01 ENCOUNTER — Other Ambulatory Visit: Payer: Self-pay | Admitting: Family Medicine

## 2016-12-04 ENCOUNTER — Other Ambulatory Visit: Payer: Self-pay | Admitting: Family Medicine

## 2016-12-05 ENCOUNTER — Other Ambulatory Visit: Payer: Self-pay | Admitting: Family Medicine

## 2016-12-05 ENCOUNTER — Ambulatory Visit (INDEPENDENT_AMBULATORY_CARE_PROVIDER_SITE_OTHER): Payer: Medicare PPO | Admitting: Internal Medicine

## 2016-12-05 VITALS — BP 115/70 | HR 59 | Temp 98.3°F | Wt 106.0 lb

## 2016-12-05 DIAGNOSIS — F411 Generalized anxiety disorder: Secondary | ICD-10-CM

## 2016-12-05 DIAGNOSIS — J309 Allergic rhinitis, unspecified: Secondary | ICD-10-CM

## 2016-12-05 DIAGNOSIS — R319 Hematuria, unspecified: Secondary | ICD-10-CM | POA: Diagnosis not present

## 2016-12-05 DIAGNOSIS — I10 Essential (primary) hypertension: Secondary | ICD-10-CM | POA: Diagnosis not present

## 2016-12-05 DIAGNOSIS — E1169 Type 2 diabetes mellitus with other specified complication: Secondary | ICD-10-CM

## 2016-12-05 LAB — POCT UA - MICROSCOPIC ONLY

## 2016-12-05 LAB — POCT URINALYSIS DIP (MANUAL ENTRY)
Bilirubin, UA: NEGATIVE
GLUCOSE UA: NEGATIVE mg/dL
Ketones, POC UA: NEGATIVE mg/dL
Leukocytes, UA: NEGATIVE
NITRITE UA: NEGATIVE
SPEC GRAV UA: 1.01 (ref 1.010–1.025)
UROBILINOGEN UA: 0.2 U/dL
pH, UA: 5.5 (ref 5.0–8.0)

## 2016-12-05 MED ORDER — FLUTICASONE PROPIONATE 50 MCG/ACT NA SUSP: 1.0000 | Freq: Every day | NASAL | 2 refills | Status: DC

## 2016-12-05 MED ORDER — HYDROXYZINE HCL 25 MG PO TABS: 25.0000 mg | ORAL_TABLET | Freq: Three times a day (TID) | ORAL | 0 refills | Status: DC | PRN

## 2016-12-05 NOTE — Patient Instructions (Addendum)
Ms. Utley,  For diabetes, focus on changes you can make in your diet and increase metformin to 1000 mg twice daily as suggested by Dr. Valentina Lucks. Burnis Medin hold off on starting another agent at this time.  For blood pressure, stop the amlodipine and continue HCTZ and lisinopril for now. Return for blood pressure check in 1-2 weeks. Try to get a morning appointment, so you could also have fasting labs done.  Try flonase for nasal congestion.   Take atarax as needed for anxiety. Do not take with alcohol.   I will call with urine results.  Best, Dr. Ola Spurr

## 2016-12-05 NOTE — Progress Notes (Signed)
[facility] Progress Note  Subjective:  Mallory Hayes is a 71 y.o. female with history of HTN, OSA, T2DM, and mood disorder who presents for medication follow-up. Also with nasal congestion.  #T2DM: - Was to start jardiance 10 mg daily and increase metformin to 1000 mg BID after meeting with Dr. Valentina Lucks in July for blood sugar management but says jardiance was hundreds of dollars and that she was worried increased metformin could hurt her kidneys - Has been eating more erratically and giving less thought to her diet because of frequent travel to visit her sick brother in [place]. He now has been transferred to [facility], so patient thinks she will have more time to dedicate to her health. - Has not been checking blood sugars.  - had tried glipizide in the past but says it caused "a bad feeling" - also on asa 81 and atorvastatin  #HTN: - Says blood pressure has been up and down - Thinks stress has caused some increased pressures lately - On lisinopril 20 mg and hctz 12.5 mg and amlodipine at decreased dose of 2.5 mg BID down from 5 mg BID after meeting with Dr. Valentina Lucks   #Nasal congestion: - Said this started after being in a cold room a couple days ago - No cough - No fevers - Used to take nasonex for similar symptoms but has run out  #Anxiety: - Says she used to take a pill that started with a "t" that helped her with nerves; only see trazodone and tramadol in her med list - She hopes there is something non-addicting she could take from time to time when feels overwhelmed  Social: Former smoker  Allergies  Allergen Reactions  . Amaryl [Glimepiride] Anxiety  . Food Itching    Spices, peppers  . Sulfamethoxazole Itching and Rash    REACTION: sneezing    Objective: Blood pressure 115/70, pulse (!) 59, temperature 98.3 F (36.8 C), weight 106 lb (48.1 kg), SpO2 97 %. Body mass index is 20.03 kg/m. Constitutional: Thin female in NAD HENT: MMM, normal posterior  oropharynx, swollen and erythematous nasal turbinates, normal TMs bilaterally Cardiovascular: RRR, S1, S2, soft SEM at RUSB.  Pulmonary/Chest: Effort normal and breath sounds normal. No respiratory distress.  Abdominal: Soft. +BS, NT, ND Musculoskeletal: No LE edema Psychiatric: Anxious affect  Vitals reviewed  A1c in July 8.4 compared to 6.9 eleven months ago  Assessment/Plan: Type 2 diabetes mellitus - Poorly controlled with increase in last A1c. - Patient could not afford jardiance and did not increase metformin dose as instructed. - Counseled patient that based on last labs of kidney function it is safe for her to take increased dose of metformin. Recommended she make this change.  - Discussed that dietary changes and regular exercise could improve sugars. Suggested packing snacks/meals when having to rush around to visit her sick brother to avoid eating high-carb convenience foods.  - Would revisit adding another agent if A1c still elevated in a couple months after patient has had a chance to make lifestyle modifications  HYPERTENSION, BENIGN SYSTEMIC - BP below goal of 140/90.  - Trial stopping amlodipine now that stressors beginning to improve and expect some improvement in BPs due to this - Return in 1-2 months for BP recheck  Allergic rhinitis - Restart nasal steroid  Anxiety state - Patient not wanting to be on daily medication. To try low dose of atarax 25 mg prn. Advised patient to stop if causing dizziness or grogginess.   Obtained  UA, as patient had hematuria on previous UA.   Follow-up in 1-2 weeks for BP monitoring and fasting labs requested by PCP for statin management (CMP and lipid panel).  Mallory Floss, MD [place], PGY-3

## 2016-12-07 NOTE — Assessment & Plan Note (Addendum)
-   Patient not wanting to be on daily medication. To try low dose of atarax 25 mg prn. Advised patient to stop if causing dizziness or grogginess.

## 2016-12-07 NOTE — Assessment & Plan Note (Signed)
-   Restart nasal steroid

## 2016-12-07 NOTE — Assessment & Plan Note (Signed)
-   BP below goal of 140/90.  - Trial stopping amlodipine now that stressors beginning to improve and expect some improvement in BPs due to this - Return in 1-2 months for BP recheck

## 2016-12-07 NOTE — Assessment & Plan Note (Signed)
-   Poorly controlled with increase in last A1c. - Patient could not afford jardiance and did not increase metformin dose as instructed. - Counseled patient that based on last labs of kidney function it is safe for her to take increased dose of metformin. Recommended she make this change.  - Discussed that dietary changes and regular exercise could improve sugars. Suggested packing snacks/meals when having to rush around to visit her sick brother to avoid eating high-carb convenience foods.  - Would revisit adding another agent if A1c still elevated in a couple months after patient has had a chance to make lifestyle modifications

## 2016-12-10 ENCOUNTER — Telehealth: Payer: Self-pay | Admitting: Internal Medicine

## 2016-12-10 DIAGNOSIS — R319 Hematuria, unspecified: Secondary | ICD-10-CM

## 2016-12-10 NOTE — Telephone Encounter (Signed)
Called patient to let her know blood still seen in urine and will refer to Urology.

## 2016-12-20 ENCOUNTER — Encounter: Payer: Self-pay | Admitting: Internal Medicine

## 2016-12-20 ENCOUNTER — Ambulatory Visit (INDEPENDENT_AMBULATORY_CARE_PROVIDER_SITE_OTHER): Payer: Medicare PPO | Admitting: Internal Medicine

## 2016-12-20 VITALS — BP 108/52 | HR 45 | Temp 97.7°F | Ht 61.0 in | Wt 103.8 lb

## 2016-12-20 DIAGNOSIS — I10 Essential (primary) hypertension: Secondary | ICD-10-CM

## 2016-12-20 DIAGNOSIS — E785 Hyperlipidemia, unspecified: Secondary | ICD-10-CM

## 2016-12-20 DIAGNOSIS — R634 Abnormal weight loss: Secondary | ICD-10-CM

## 2016-12-20 MED ORDER — BLOOD PRESSURE CUFF MISC: 1.0000 | Freq: Every day | 0 refills | Status: DC

## 2016-12-20 NOTE — Progress Notes (Signed)
[facility] Progress Note  Subjective:  Mallory Hayes is a 71 y.o. is a HTN, OSA, T2DM, and anxiety who presents for blood pressure follow-up.   HTN: - Stopped amlodipine at last OV. - Says had an annual nurse check up through her insurance this weekend with BP of 138/82 - Has not been drinking much fluids lately - Would like to have a BP cuff at home to check - Has been meditating and feeling a lot calmer. Has not needed to try hydroxyzine.  ROS: Not feeling dizzy and no falls.   Weight loss: - Only eating 2 meals a day - Still with good appetite  Hematuria: - Present on 2 UAs. First initially done as follow-up for reported hematuria 4 years ago.  - Patient has not heard back from [facility] yet.   HLD: - On crestor 20 mg daily. - Denies muscle aches or trouble taking medication.   Allergies  Allergen Reactions  . Amaryl [Glimepiride] Anxiety  . Food Itching    Spices, peppers  . Sulfamethoxazole Itching and Rash    REACTION: sneezing    Objective: Blood pressure (!) 108/52, pulse (!) 45, temperature 97.7 F (36.5 C), temperature source Oral, height 5\' 1"  (1.549 m), weight 103 lb 12.8 oz (47.1 kg), SpO2 99 %. Body mass index is 19.61 kg/m. Constitutional: Thin female in NAD HENT: MMM Cardiovascular: RRR, S1, S2, no m/r/g.  Pulmonary/Chest: Effort normal and breath sounds normal. No respiratory distress.   Neurological: AOx3, no focal deficits. Psychiatric: Normal mood and affect.  Vitals reviewed  Assessment/Plan: Essential hypertension - Lower BP today but asymptomatic. At goal of < 140/90.  - Would not restart amlodipine. Continue hctz 12.5 mg and lisinopril 20 mg BID--would likely cut lisinopril back to once daily if continues to have BPs below goal of 140/90 and unclear why prescribed BID.  - Ordered patient BP cuff for home monitoring  WEIGHT LOSS, ABNORMAL - Weight down another 3 lbs since 2 weeks ago.  - Reports decent appetite but only  having 2 meals a day. Stress of visiting her sick brother may be contributing but does not desire medication for mood at this time. Feels she is having success with meditation.  - Encouraged patient to have 3 meals a day with snacks in between. Could try low sugar protein shakes between meals. - Close follow-up. - Referred to Urology for incidental hematuria x 2.   Hyperlipidemia - Patient on crestor 20 mg daily.  - PCP requested CMP and lipid panel for monitoring after last OV - Orders placed.  Patient would like to try wand glucometer. Told her to make appointment with Dr. Valentina Lucks for a trial.   Of note, patient says she was not aware of her upcoming Dermatology appointment despite there being a note documenting that [facility] had confirmed and spoken with patient. Consider screening for memory issues at next visit.  Follow-up in 4 weeks for weight and BP check.  Olene Floss, MD [place], PGY-3

## 2016-12-20 NOTE — Patient Instructions (Signed)
Ms. Knapper,  Your blood pressure looks good today. I would not restart the amlodipine.   Try to have 3 regular meals a day--this is important for blood sugar stability and maintaining a healthy weight.  Meditation is a great exercise to reduce stress and can help blood pressure, as you have seen!  Call [facility] at [phone] for appointment if you do not hear from them by the end of this week.  Best, Dr. Ola Spurr

## 2016-12-21 LAB — COMPREHENSIVE METABOLIC PANEL
A/G RATIO: 1.4 (ref 1.2–2.2)
ALBUMIN: 4.3 g/dL (ref 3.5–4.8)
ALT: 16 IU/L (ref 0–32)
AST: 23 IU/L (ref 0–40)
Alkaline Phosphatase: 64 IU/L (ref 39–117)
BILIRUBIN TOTAL: 0.4 mg/dL (ref 0.0–1.2)
BUN / CREAT RATIO: 16 (ref 12–28)
BUN: 24 mg/dL (ref 8–27)
CALCIUM: 10.2 mg/dL (ref 8.7–10.3)
CHLORIDE: 97 mmol/L (ref 96–106)
CO2: 25 mmol/L (ref 20–29)
Creatinine, Ser: 1.52 mg/dL — ABNORMAL HIGH (ref 0.57–1.00)
GFR, EST AFRICAN AMERICAN: 40 mL/min/{1.73_m2} — AB (ref >59)
GFR, EST NON AFRICAN AMERICAN: 34 mL/min/{1.73_m2} — AB (ref >59)
Globulin, Total: 3.1 g/dL (ref 1.5–4.5)
Glucose: 168 mg/dL — ABNORMAL HIGH (ref 65–99)
POTASSIUM: 3.6 mmol/L (ref 3.5–5.2)
Sodium: 139 mmol/L (ref 134–144)
TOTAL PROTEIN: 7.4 g/dL (ref 6.0–8.5)

## 2016-12-21 LAB — LIPID PANEL
CHOL/HDL RATIO: 2.1 ratio (ref 0.0–4.4)
Cholesterol, Total: 130 mg/dL (ref 100–199)
HDL: 61 mg/dL (ref >39)
LDL Calculated: 59 mg/dL (ref 0–99)
Triglycerides: 49 mg/dL (ref 0–149)
VLDL Cholesterol Cal: 10 mg/dL (ref 5–40)

## 2016-12-23 NOTE — Assessment & Plan Note (Signed)
-   Lower BP today but asymptomatic. At goal of < 140/90.  - Would not restart amlodipine. Continue hctz 12.5 mg and lisinopril 20 mg BID--would likely cut lisinopril back to once daily if continues to have BPs below goal of 140/90 and unclear why prescribed BID.  - Ordered patient BP cuff for home monitoring

## 2016-12-23 NOTE — Assessment & Plan Note (Signed)
-   Patient on crestor 20 mg daily.  - PCP requested CMP and lipid panel for monitoring after last OV - Orders placed.

## 2016-12-23 NOTE — Assessment & Plan Note (Addendum)
-   Weight down another 3 lbs since 2 weeks ago.  - Reports decent appetite but only having 2 meals a day. Stress of visiting her sick brother may be contributing but does not desire medication for mood at this time. Feels she is having success with meditation.  - Encouraged patient to have 3 meals a day with snacks in between. Could try low sugar protein shakes between meals. - Close follow-up. - Referred to Urology for incidental hematuria x 2.

## 2016-12-24 ENCOUNTER — Telehealth: Payer: Self-pay | Admitting: Internal Medicine

## 2016-12-24 ENCOUNTER — Encounter: Payer: Self-pay | Admitting: Internal Medicine

## 2016-12-24 NOTE — Telephone Encounter (Signed)
Spoke with patient over the phone. Her SCr was elevated to 1.5 with last check, whereas is normally around 1. Recommended reducing metformin dose by 50% to 500 mg once daily (she is on the XR version and had previously been instructed to take 2 tablets BID but has only been taking 2 tablets in the a.m.). Also recommended reducing lisinopril dose to 20 mg daily instead of 40 mg daily, as had lower BP at our last OV. To continue statin at current dose, as had LDL below 70 with recent labs. Encouraged her to stay hydrated. She is to see Dr. Valentina Lucks this Friday for wand glucometer; would like for her to have BP checked at this time, as well.

## 2016-12-27 ENCOUNTER — Ambulatory Visit: Payer: Medicare PPO | Admitting: Pharmacist

## 2017-01-10 ENCOUNTER — Ambulatory Visit: Payer: Medicare PPO | Admitting: Pharmacist

## 2017-01-17 ENCOUNTER — Encounter: Payer: Self-pay | Admitting: Pharmacist

## 2017-01-17 ENCOUNTER — Ambulatory Visit (INDEPENDENT_AMBULATORY_CARE_PROVIDER_SITE_OTHER): Payer: Medicare PPO | Admitting: *Deleted

## 2017-01-17 ENCOUNTER — Ambulatory Visit (INDEPENDENT_AMBULATORY_CARE_PROVIDER_SITE_OTHER): Payer: Medicare PPO | Admitting: Pharmacist

## 2017-01-17 DIAGNOSIS — S80861A Insect bite (nonvenomous), right lower leg, initial encounter: Secondary | ICD-10-CM

## 2017-01-17 DIAGNOSIS — E1169 Type 2 diabetes mellitus with other specified complication: Secondary | ICD-10-CM | POA: Diagnosis not present

## 2017-01-17 DIAGNOSIS — W57XXXA Bitten or stung by nonvenomous insect and other nonvenomous arthropods, initial encounter: Secondary | ICD-10-CM

## 2017-01-17 NOTE — Progress Notes (Signed)
   Patient presented to clinic for appt with Dr. Valentina Lucks. Asked if RN could remove "build up" from navel. This was done with tweezers without incident. Patient very appreciative. Also states insect bite on inner aspect of right thigh that itches but is not painful. Has been using hydrocortisone cream with relief. Approx 5 mm red induration on inner aspect of right high with no striations noted. Patient denies fevers. Patient will monitor and call clinic if red streaks or fevers develop. PCP made aware. Hubbard Hartshorn, RN, BSN

## 2017-01-17 NOTE — Progress Notes (Signed)
Patient ID: Mallory Hayes, female   DOB: [birth year], 71 y.o.   MRN: [id number] Reviewed: Agree with Dr. Graylin Shiver documentation and management.

## 2017-01-17 NOTE — Assessment & Plan Note (Signed)
Diabetes longstanding currently uncontrolled with A1c 8.4%. Patient denies hypoglycemic events and is able to verbalize appropriate hypoglycemia management plan. Patient reports adherence with medication. Control is suboptimal due to increased life stressors. -Contractor Continuous Glucose Monitor sensor. Placed on Lateral Right upper Arm on 01/17/2017. Scheduled off date: 01/31/2017. Serial number: [id number]. -Advised patient to bring sensor back to clinic if it falls off early, avoid large doses of aspirin and vitamin C products while wearing sensor, and continue checking blood glucose and taking medications as normal. Will return to clinic in 1 week to assess data and adjust medications as needed.

## 2017-01-17 NOTE — Progress Notes (Signed)
    S:     Chief Complaint  Patient presents with  . Medication Management    Diabetes    Patient arrives in good spirits, ambulating without assistance.  Presents for diabetes evaluation, education, and management at the request of Dr. Ola Spurr. Patient was referred on 12/20/16.  Patient was last seen by Primary Care Provider on 12/20/16. Patient is here primarily for placement of [employer] Professional Continuous Glucose Monitor sensor. Patient does not check her CBGs regularly.   Reports she has gained back ~3 lbs which she is happy about. Attributes this to eating more. Has implemented changes recommended by Dr. Ola Spurr.   Patient reports Diabetes was diagnosed in 2008.  Family/Social History: States that her brother passed away ~2 weeks ago which has caused stressed. Stress now improving. Reports that she still has a fair amount of work to arrange his affairs. Endorses some stressful days.   Patient reports adherence with medications.  Current diabetes medications include: Metformin XR 500 mg daily  Current hypertension medications include: lisinopril 20 mg, HCTZ 12.5 mg  Patient denies hypoglycemic events.   O:  Physical Exam  Constitutional: She appears well-developed and well-nourished.  Vitals reviewed.   Review of Systems  All other systems reviewed and are negative.   Lab Results  Component Value Date   HGBA1C 8.4 11/09/2016   Vitals:   01/17/17 1021  BP: 125/85   Home fasting CBG: 130-150s     A/P: Diabetes longstanding currently uncontrolled with A1c 8.4%. Patient denies hypoglycemic events and is able to verbalize appropriate hypoglycemia management plan. Patient reports adherence with medication. Control is suboptimal due to increased life stressors. -Contractor Continuous Glucose Monitor sensor. Placed on Lateral Right upper Arm on 01/17/2017. Scheduled off date: 01/31/2017. Serial number: [id number]. -Advised patient to  bring sensor back to clinic if it falls off early, avoid large doses of aspirin and vitamin C products while wearing sensor, and continue checking blood glucose and taking medications as normal. Will return to clinic in 1 week to assess data and adjust medications as needed. -Next A1C anticipated October 2018.    ASCVD risk greater than 7.5%. Continued Aspirin 81 mg and Continued rosuvastatin 20 mg.   Hypertension longstanding currently controlled.  Patient reports adherence with medication. Continue current medications.   Written patient instructions provided.  Total time in face to face counseling 30 minutes.   Follow up in Pharmacist Clinic Visit in 1 week.   Patient seen with Cleotis Lema, PharmD Candidate, and Deirdre Pippins, PharmD PGY-2 Resident.

## 2017-01-17 NOTE — Patient Instructions (Addendum)
Thank you for coming to see [place] today! According to your A1C your average sugars have been running in the 200s.   1. Pay close attention to your sugars on the days that you are stressed. No medication changes today.  Continue to take all medications the same as you have done over the last several days.   2. Check your sugars either first thing in the morning or randomly about 2 hours after you've eaten a meal. Keep notes about the meals you ate just before that sugar check (large meal, moderate meal, dessert, sweets, etc.) and the stress you're experiencing during that day.   3. Please return to the pharmacist clinic in 1 week for review of your blood sugar sensor device.  We will share results with you in one week.   4. Plan to return to the office in 2 weeks for a second review of your readings and for sensor removal.     If the sensor "falls off" please keep it in a plastic bag and bring it back to the office.

## 2017-01-24 ENCOUNTER — Encounter: Payer: Self-pay | Admitting: Pharmacist

## 2017-01-24 ENCOUNTER — Other Ambulatory Visit: Payer: Self-pay | Admitting: Family Medicine

## 2017-01-24 ENCOUNTER — Ambulatory Visit: Payer: Medicare PPO

## 2017-01-24 ENCOUNTER — Ambulatory Visit (INDEPENDENT_AMBULATORY_CARE_PROVIDER_SITE_OTHER): Payer: Medicare PPO | Admitting: Pharmacist

## 2017-01-24 DIAGNOSIS — E1169 Type 2 diabetes mellitus with other specified complication: Secondary | ICD-10-CM | POA: Diagnosis not present

## 2017-01-24 NOTE — Progress Notes (Signed)
Patient ID: Mallory Hayes, female   DOB: 22-May-1945, 71 y.o.   MRN: [id number] Reviewed: Agree with Dr. Graylin Shiver documentation and management.

## 2017-01-24 NOTE — Progress Notes (Signed)
    S:     Chief Complaint  Patient presents with  . Medication Management    Diabetes - CGM    Patient arrives in good spirits, anxious to get CGM data.  Presents for diabetes evaluation, education, and management at the request of  Dr. Ola Spurr. Patient was referred on 12/20/16.  Patient was last seen by Primary Care Provider on 12/20/16. Patient is here primarily for reading of [place] Professional Continuous Glucose Monitor sensor, placed last week. Patient does not check her CBGs regularly and has not since she had CGM placed.  Patient reports Diabetes was diagnosed in 2008.  Family/Social History: States that her brother passed away ~3 weeks ago which has caused stressed. Stress now improving.  Patient reports adherence with medications. Took meds today. Current diabetes medications include: Metformin XR 500 mg daily  Current hypertension medications include: lisinopril 20 mg, HCTZ 12.5 mg  Patient denies hypoglycemic events.  O:  Physical Exam  Constitutional: She appears well-developed and well-nourished.  Vitals reviewed.    Review of Systems  All other systems reviewed and are negative.  Lab Results  Component Value Date   HGBA1C 8.4 11/09/2016   Vitals:   01/24/17 1109 01/24/17 1118  BP: (!) 161/67 (!) 167/41   CGM data: unable to obtain. Unfortunately, did not start sensing glucose data  A/P: Diabetes longstanding currently uncontrolled with A1c 8.4%. Patient denies hypoglycemic events and is able to verbalize appropriate hypoglycemia management plan. Patient reports adherence with medication. Control is suboptimal due to increased life stressors. -Freestyle libre read unsuccessful. -Advised her to check CBGs once daily, rotating times, bring log into next clinic visit -Will attempt placement of CGM again if patient is willing and CBGs are uncontrolled at next visit. -Next A1C anticipated October 2018.    ASCVD risk greater than 7.5%.  Continued Aspirin 81 mg and Continued rosuvastatin 20 mg.   Hypertension longstanding currently uncontrolled.  Patient reports adherence with medication. Control suboptimal 2/2 stress and disappointment with CGM not working. Continue current medications, recheck at next office visit as she has h/o controlled BP.  Written patient instructions provided.  Total time in face to face counseling 20 minutes.   Follow up in Pharmacist Clinic Visit in 2-3 weeks.   Patient seen with Cleotis Lema, PharmD Candidate, and Deirdre Pippins, PharmD PGY-2 Resident.

## 2017-01-24 NOTE — Assessment & Plan Note (Signed)
Diabetes longstanding currently uncontrolled with A1c 8.4%. Patient denies hypoglycemic events and is able to verbalize appropriate hypoglycemia management plan. Patient reports adherence with medication. Control is suboptimal due to increased life stressors. -Freestyle libre read unsuccessful. -Advised her to check CBGs once daily, rotating times, bring log into next clinic visit -Will attempt placement of CGM again if patient is willing and CBGs are uncontrolled at next visit. -Next A1C anticipated October 2018.

## 2017-01-24 NOTE — Patient Instructions (Addendum)
Thank you for coming to see [place] today! We are so disappointed about the CGM! That is OK, we can try again.   1. Check your blood sugar once a day at different times, try first thing in the morning and 1-2 hours after you have eaten a meal.   2. We will check your blood pressure next time you come into the office, keep taking your two BP meds  3. Come back to see [place] in 2-3 weeks and bring your log book with you.    Pricing for the [employer] is below at [place] sensor - $35.99 (about $108 per month) - [employer] noted this price is subject to change Reader (one time purchase) - $69.99 - [employer] noted this price is subject to change

## 2017-01-31 ENCOUNTER — Telehealth: Payer: Self-pay | Admitting: *Deleted

## 2017-01-31 ENCOUNTER — Ambulatory Visit
Admission: RE | Admit: 2017-01-31 | Discharge: 2017-01-31 | Disposition: A | Discharge status: Home / Self Care | Payer: Medicare PPO | Source: Ambulatory Visit | Attending: Family Medicine | Admitting: Family Medicine

## 2017-01-31 DIAGNOSIS — Z1239 Encounter for other screening for malignant neoplasm of breast: Secondary | ICD-10-CM

## 2017-01-31 NOTE — Telephone Encounter (Signed)
Fax refill request received from [facility] for lisinopril. Faxed placed in provider box.  Derl Barrow, RN

## 2017-02-04 ENCOUNTER — Other Ambulatory Visit: Payer: Self-pay | Admitting: Family Medicine

## 2017-02-04 DIAGNOSIS — R928 Other abnormal and inconclusive findings on diagnostic imaging of breast: Secondary | ICD-10-CM

## 2017-02-06 ENCOUNTER — Other Ambulatory Visit: Payer: Self-pay | Admitting: Family Medicine

## 2017-02-06 ENCOUNTER — Ambulatory Visit
Admission: RE | Admit: 2017-02-06 | Discharge: 2017-02-06 | Disposition: A | Discharge status: Home / Self Care | Payer: Medicare PPO | Source: Ambulatory Visit | Attending: Family Medicine | Admitting: Family Medicine

## 2017-02-06 DIAGNOSIS — N6320 Unspecified lump in the left breast, unspecified quadrant: Secondary | ICD-10-CM

## 2017-02-06 DIAGNOSIS — R928 Other abnormal and inconclusive findings on diagnostic imaging of breast: Secondary | ICD-10-CM

## 2017-02-06 DIAGNOSIS — I10 Essential (primary) hypertension: Secondary | ICD-10-CM

## 2017-02-06 MED ORDER — LISINOPRIL 20 MG PO TABS: 40.0000 mg | ORAL_TABLET | Freq: Every day | ORAL | 1 refills | Status: DC

## 2017-02-07 ENCOUNTER — Inpatient Hospital Stay: Admission: RE | Admit: 2017-02-07 | Payer: Medicare PPO | Source: Ambulatory Visit

## 2017-02-07 ENCOUNTER — Other Ambulatory Visit: Payer: Self-pay | Admitting: Family Medicine

## 2017-02-07 DIAGNOSIS — N6320 Unspecified lump in the left breast, unspecified quadrant: Secondary | ICD-10-CM

## 2017-02-13 ENCOUNTER — Other Ambulatory Visit: Payer: Medicare PPO

## 2017-02-14 ENCOUNTER — Ambulatory Visit: Payer: Medicare PPO | Admitting: Pharmacist

## 2017-02-19 ENCOUNTER — Ambulatory Visit
Admission: RE | Admit: 2017-02-19 | Discharge: 2017-02-19 | Disposition: A | Discharge status: Home / Self Care | Payer: Medicare PPO | Source: Ambulatory Visit | Attending: Family Medicine | Admitting: Family Medicine

## 2017-02-19 ENCOUNTER — Other Ambulatory Visit: Payer: Self-pay | Admitting: Family Medicine

## 2017-02-19 DIAGNOSIS — N6320 Unspecified lump in the left breast, unspecified quadrant: Secondary | ICD-10-CM

## 2017-02-22 ENCOUNTER — Ambulatory Visit
Admission: RE | Admit: 2017-02-22 | Discharge: 2017-02-22 | Disposition: A | Discharge status: Home / Self Care | Payer: Medicare PPO | Source: Ambulatory Visit | Attending: Family Medicine | Admitting: Family Medicine

## 2017-02-22 ENCOUNTER — Other Ambulatory Visit: Payer: Self-pay | Admitting: Family Medicine

## 2017-02-22 DIAGNOSIS — N6320 Unspecified lump in the left breast, unspecified quadrant: Secondary | ICD-10-CM

## 2017-03-05 ENCOUNTER — Ambulatory Visit (INDEPENDENT_AMBULATORY_CARE_PROVIDER_SITE_OTHER): Payer: Medicare PPO | Admitting: Podiatry

## 2017-03-05 ENCOUNTER — Encounter: Payer: Self-pay | Admitting: Podiatry

## 2017-03-05 DIAGNOSIS — E119 Type 2 diabetes mellitus without complications: Secondary | ICD-10-CM

## 2017-03-05 DIAGNOSIS — M79676 Pain in unspecified toe(s): Secondary | ICD-10-CM

## 2017-03-05 DIAGNOSIS — B351 Tinea unguium: Secondary | ICD-10-CM | POA: Diagnosis not present

## 2017-03-05 NOTE — Progress Notes (Signed)
Patient ID: Mallory Hayes, female   DOB: 04-08-1946, 71 y.o.   MRN: [id number] Complaint:  Visit Type: Patient returns to my office for continued preventative foot care services. Complaint: Patient states" my nails have grown long and thick and become painful to walk and wear shoes" Patient has been diagnosed with DM with no foot complications. The patient presents for preventative foot care services. No changes to ROS  Podiatric Exam: Vascular: dorsalis pedis and posterior tibial pulses are palpable bilateral. Capillary return is immediate. Temperature gradient is WNL. Skin turgor WNL  Sensorium: Normal Semmes Weinstein monofilament test. Normal tactile sensation bilaterally. Nail Exam: Pt has thick disfigured discolored nails with subungual debris noted bilateral entire nail hallux through fifth toenails Ulcer Exam: There is no evidence of ulcer or pre-ulcerative changes or infection. Orthopedic Exam: Muscle tone and strength are WNL. No limitations in general ROM. No crepitus or effusions noted. HAV  B/L and tailors bunion  B/L Skin: No Porokeratosis. No infection or ulcers  Diagnosis:  Onychomycosis, , Pain in right toe, pain in left toes  Treatment & Plan Procedures and Treatment: Consent by patient was obtained for treatment procedures. The patient understood the discussion of treatment and procedures well. All questions were answered thoroughly reviewed. Debridement of mycotic and hypertrophic toenails, 1 through 5 bilateral and clearing of subungual debris. No ulceration, no infection noted.   Return Visit-Office Procedure: Patient instructed to return to the office for a follow up visit 3 months for continued evaluation and treatment.   Gardiner Barefoot DPM

## 2017-03-19 ENCOUNTER — Telehealth: Payer: Self-pay | Admitting: Family Medicine

## 2017-03-19 ENCOUNTER — Other Ambulatory Visit: Payer: Self-pay | Admitting: Family Medicine

## 2017-03-19 DIAGNOSIS — I10 Essential (primary) hypertension: Secondary | ICD-10-CM

## 2017-03-19 MED ORDER — HYDROCHLOROTHIAZIDE 12.5 MG PO TABS: 12.5000 mg | ORAL_TABLET | Freq: Every day | ORAL | 0 refills | Status: DC

## 2017-03-19 MED ORDER — LISINOPRIL 20 MG PO TABS: 40.0000 mg | ORAL_TABLET | Freq: Every day | ORAL | 0 refills | Status: DC

## 2017-03-19 MED ORDER — ACCU-CHEK AVIVA VI SOLN: 0 refills | Status: DC

## 2017-03-19 MED ORDER — ACCU-CHEK SOFTCLIX LANCETS MISC: 12 refills | Status: DC

## 2017-03-19 MED ORDER — GLUCOSE BLOOD VI STRP: ORAL_STRIP | 12 refills | Status: DC

## 2017-03-19 MED ORDER — ACCU-CHEK AVIVA PLUS W/DEVICE KIT
PACK | 0 refills | Status: DC
Start: 2017-03-19 — End: 2018-06-06

## 2017-03-19 MED ORDER — BD SWAB SINGLE USE REGULAR PADS: MEDICATED_PAD | 3 refills | Status: DC

## 2017-03-19 NOTE — Telephone Encounter (Signed)
To [facility] red team - please call pt and let her know I have refilled her medications with [employer] but she needs to schedule an appointment to follow up on her blood pressure and diabetes. Thanks!  [name], MD

## 2017-03-27 NOTE — Telephone Encounter (Signed)
Pt informed and scheduled for an appt. Kweku Stankey, CMA  

## 2017-03-28 NOTE — Progress Notes (Signed)
Subjective:    Patient ID: Mallory Hayes, female    DOB: 12-Aug-1945, 71 y.o.   MRN: [id number]   CC: DM & HTN & Flu shot  HPI: Diabetes  Fasting checks: does not check. In the process of getting a new meter Post prandial does not check. In the process of getting a new meter  Compliance: only takes metformin 1000mg  once a day rather than twice a day. States that she was instructed to do so, but per chart review patient is supposed to be on 1000mg  bid  Diet: poor. Eats carbs with meals and also daily Snicker's bar. Limited salt and other sugar intake   Exercise: daily walks  Eye exam: January 2018 Foot exam: July 2018 A1C: 9.7. States her A1C is likely high due to stress and lack of sleep from stress.  Symptoms: Denies symptoms of hypoglycemia. Endorses symptoms of  polyuria but denies polydipsia. Denies numbness in extremities, and no foot ulcers/trauma Meds: metformin 1000mg  bid (but does not take as prescribed)   Hypertension: - Medications: HCTZ 12.5mg , lisinopril 40mg  (patient not taking as prescribed)  - Compliance: takes HCTZ daily, but only takes 20mg  of lisinopril  - Checking BP at home: no  - Denies any SOB, CP, vision changes, LE edema, medication SEs, or symptoms of hypotension - Diet: decreased salt intake - Exercise: daily walks   Smoking status reviewed. Former smoker. Used to smoke occasionally when she was 71 y.o.    Objective:  BP 140/80 (BP Location: Left Arm, Patient Position: Sitting, Cuff Size: Normal)   Pulse (!) 53   Temp 98 F (36.7 C) (Oral)   Wt 115 lb 9.6 oz (52.4 kg)   SpO2 99%   BMI 21.84 kg/m  Vitals and nursing note reviewed  General: well nourished, in no acute distress HEENT: normocephalic, PERRL, no scleral icterus or conjunctival pallor, no nasal discharge, moist mucous membranes, without erythema or discharge noted in posterior oropharynx Neck: supple, non-tender, without lymphadenopathy Cardiac: RRR, clear S1 and S2, no murmurs, rubs,  or gallops Respiratory: clear to auscultation bilaterally, no increased work of breathing Abdomen: soft, nontender, nondistended, no masses or organomegaly. Bowel sounds present Extremities: no edema or cyanosis. Warm, well perfused. 2+ radial pulses bilaterally Skin: warm and dry, no rashes noted Neuro: alert and oriented, no focal deficits  Assessment & Plan:    Essential hypertension -BP at goal of <140/90 in spite of non-compliance  -continue HCTZ 12.5mg  and lisinopril 20mg  daily as patient has been taking.  -advised patient to monitor BP at home -follow up in 1 month with PCP  -may want to consider combination pill to help decrease pill burden and help patient with compliance   Type 2 diabetes mellitus Poorly controlled. Patient is non compliant with dosing of medications as she feels "her body can't tolerate so much medicine", however this seems to not be due to symptoms as patient denies symptoms of hypoglycemia. Patient agreeable to increasing her metformin dose after lengthy conversation of negative effects of poor glycemic control on body. Patient instructed on healthy eating habits -metformin 1000mg  bid -handout on healthy diabetic diet given with AVS -follow up in 1 week with Dr. Valentina Lucks  -home health ordered to help with medication management and education  -[facility] referral for medication management/education  -follow up with PCP in 1 month   Healthcare maintenance Patient received flu vaccine today     Return in about 1 week (around 04/05/2017) for Dr. Valentina Lucks for diabetes management. 1 month f/u  with PCP.   Caroline More, DO, PGY-1

## 2017-03-29 ENCOUNTER — Ambulatory Visit (INDEPENDENT_AMBULATORY_CARE_PROVIDER_SITE_OTHER): Payer: Medicare PPO | Admitting: Family Medicine

## 2017-03-29 VITALS — BP 140/80 | HR 53 | Temp 98.0°F | Wt 115.6 lb

## 2017-03-29 DIAGNOSIS — I10 Essential (primary) hypertension: Secondary | ICD-10-CM

## 2017-03-29 DIAGNOSIS — Z23 Encounter for immunization: Secondary | ICD-10-CM

## 2017-03-29 DIAGNOSIS — Z0000 Encounter for general adult medical examination without abnormal findings: Secondary | ICD-10-CM | POA: Diagnosis not present

## 2017-03-29 DIAGNOSIS — E118 Type 2 diabetes mellitus with unspecified complications: Secondary | ICD-10-CM | POA: Diagnosis not present

## 2017-03-29 LAB — POCT GLYCOSYLATED HEMOGLOBIN (HGB A1C): HEMOGLOBIN A1C: 9.7

## 2017-03-29 MED ORDER — METFORMIN HCL ER 500 MG PO TB24: 1000.0000 mg | ORAL_TABLET | Freq: Two times a day (BID) | ORAL | 1 refills | Status: DC

## 2017-03-29 MED ORDER — LISINOPRIL 20 MG PO TABS: 20.0000 mg | ORAL_TABLET | Freq: Every day | ORAL | 0 refills | Status: DC

## 2017-03-29 NOTE — Assessment & Plan Note (Signed)
Patient received flu vaccine today. 

## 2017-03-29 NOTE — Assessment & Plan Note (Addendum)
-  BP at goal of <140/90 in spite of non-compliance  -continue HCTZ 12.5mg  and lisinopril 20mg  daily as patient has been taking.  -advised patient to monitor BP at home -follow up in 1 month with PCP  -may want to consider combination pill to help decrease pill burden and help patient with compliance

## 2017-03-29 NOTE — Patient Instructions (Addendum)
  Diet Recommendations for Diabetes   Starchy (carb) foods: Bread, rice, pasta, potatoes, corn, cereal, grits, crackers, bagels, muffins, all baked goods.  (Fruits, milk, and yogurt also have carbohydrate, but most of these foods will not spike your blood sugar as the starchy foods will.)  A few fruits do cause high blood sugars; use small portions of bananas (limit to 1/2 at a time), grapes, watermelon, oranges, and most tropical fruits.    Protein foods: Meat, fish, poultry, eggs, dairy foods, and beans such as pinto and kidney beans (beans also provide carbohydrate).   1. Eat at least 3 meals and 1-2 snacks per day. Never go more than 4-5 hours while awake without eating. Eat breakfast within the first hour of getting up.   2. Limit starchy foods to TWO per meal and ONE per snack. ONE portion of a starchy  food is equal to the following:   - ONE slice of bread (or its equivalent, such as half of a hamburger bun).   - 1/2 cup of a "scoopable" starchy food such as potatoes or rice.   - 15 grams of carbohydrate as shown on food label.  3. Include at every meal: a protein food, a carb food, and vegetables and/or fruit.   - Obtain twice the volume of veg's as protein or carbohydrate foods for both lunch and dinner.   - Fresh or frozen veg's are best.   - Keep frozen veg's on hand for a quick vegetable serving.       It was a pleasure seeing you today.   Today we discussed you diabetes and your high blood pressure  For your diabetes: please take metformin 1000 mg twice a day. Please also use lifestyle changes such as getting better rest, decreasing stress, and diet modifications. Please follow up with Dr. Valentina Lucks in 1 week and with your PCP afterwards for diabetes monitoring.   For your blood pressure: please continue the regimen you are on (lisinopril 20mg  daily and HCTZ 12.5 mg daily). I will not change your medications right now but you should follow up with you PCP in 1 month for blood pressure  monitoring.    Please follow up in 1 week with Dr. Valentina Lucks and 1 month with your PCP or sooner if symptoms persist or worsen. Please call the clinic immediately if you have concerns.   I have referred you for home health to help you with medications.   Thank you for getting the flu vaccine today.   Our clinic's number is [phone]. Please call with questions or concerns.   Thank you,  Caroline More, DO

## 2017-03-29 NOTE — Assessment & Plan Note (Addendum)
Poorly controlled. Patient is non compliant with dosing of medications as she feels "her body can't tolerate so much medicine", however this seems to not be due to symptoms as patient denies symptoms of hypoglycemia. Patient agreeable to increasing her metformin dose after lengthy conversation of negative effects of poor glycemic control on body. Patient instructed on healthy eating habits -metformin 1000mg  bid -handout on healthy diabetic diet given with AVS -follow up in 1 week with Dr. Valentina Lucks  -home health ordered to help with medication management and education  -[facility] referral for medication management/education  -follow up with PCP in 1 month

## 2017-04-01 ENCOUNTER — Other Ambulatory Visit: Payer: Self-pay

## 2017-04-01 NOTE — Patient Outreach (Signed)
[facility] [facility]) Care Management  04/01/2017  Annis Lagoy 1945-10-18 [id number]   TELEPHONE SCREENING Referral date: 04/01/17 Referral source: primary MD  Referral reason: Medication management and diabetes Insurance: [employer] Attempt #1  Telephone call to patient regarding primary MD referral. Unable to reach patient. Attempt call to listed home number.  Unable to leave voice message due to phone only ringing.  Attempted listed mobile number. Unable to reach patient. HIPAA compliant voice message left with call back phone number.   PLAN: RNCM will attempt 2nd telephone outreach to patient within 5 business days.   Quinn Plowman RN,BSN,CCM [facility] Telephonic  [phone]

## 2017-04-03 ENCOUNTER — Other Ambulatory Visit: Payer: Self-pay

## 2017-04-03 ENCOUNTER — Other Ambulatory Visit: Payer: Self-pay | Admitting: Licensed Clinical Social Worker

## 2017-04-03 NOTE — Patient Outreach (Signed)
Request received from Brooke Joyce, LCSW to mail patient personal care resources.  Information mailed today. 

## 2017-04-03 NOTE — Patient Outreach (Signed)
  [facility]) Care Management  04/03/2017  Kaliyan Osbourn February 23, 1946 [id number]  Assessment- CSW received new referral on patient for mobile meal assistance, transportation and need for grief resources. CSW completed initial outreach call and was able to reach patient successfully. HIPPA verifications were provided. CSW introduced self, reason for call and of [facility] social work services. Patient reports need assistance with transportation. CSW provided education on [employer], [employer] and [employer] transportation. Patient was unaware that she had transportation benefits through her insurance. Patient DOES NOT want [employer] services. CSW provided education on how to arrange rides through her insurance. Patient reports driving herself to appointments but that some days she will experience some difficulty. Patient will use [employer] as a back up resource in order to get to her medical appointments. CSW will mail out information on [place] for her to keep and patient was reminded she must contact them 2 days in advance to set up transportation arrangements. Patient expressed understanding.  Patient is also interested [employer] and Grief resources. Patient reports losing her brother in September of 2018. She shares that she is struggling with this loss and CSW provided emotional support during phone call that patient was receptive to. Patient reports having difficulty preparing meals which is contributed to her grief. CSW informed patient that [employer] is currently not placing anyone on their wait list but that it was suggested to CSW to contact back in a few months. Patient is agreeable to CSW mailing out information on [employer] (including number to contact to make self referral.) Patient was encouraged to contact program in the next few months to see if they are accepting referrals. In the mean time, CSW will arrange for a 30 day supply of [employer] paid for  by [employer] [place]. Patient is very appreciative of this and feels that in 30 days she will have more energy and strength to prepare her own meals. CSW will send [place] Management Assistant a request to set patient up for 30 day [employer] supply. CSW will mail patient information on [employer] so that she can follow up. CSW provided education on available grief support resources. Patient is unsure if she is interested in grief support groups at this time but is agreeable to [place] mailing out information to her. CSW provided education on individual counseling through [place]. CSW informed her that services are all free to those that have experienced loss in the [facility] area. Patient is appreciative of this information and is agreeable to CSW mailing her grief support resources in the mail. CSW questioned if patient would like for CSW to complete home visit to review all of these resources again in person. Patient declined and reports having a good understanding of resources discussed. Patient is agreeable to contact this CSW if she has any questions or concerns in the future. CSW will not open case.  Plan-CSW will [place] Management Assistant. CSW will mail grief support resources, transportation resources and mobile meal contact information. CSW will not open case at this time.  Eula Fried, BSW, MSW, [email or web address] Phone: [phone] Fax: [phone]

## 2017-04-03 NOTE — Patient Outreach (Signed)
Request received from Eula Fried, LCSW to refer patient to [employer] [facility].  Referral made on 04/03/17.  Meals will begin as soon as possible.  Programmer, multimedia will notify patient.

## 2017-04-03 NOTE — Patient Outreach (Signed)
[facility]) Care Management  04/03/2017  Arlee Bossard [birth year] [id number]  TELEPHONE SCREENING Referral date: 04/03/17 Referral source: primary MD Referral reason: medication management and diabetes Insurance: [employer]  Telephone call to patient regarding primary MD referral.  HIPAA verified with patient. Discussed and offered [facility] care management program with patient. Patient verbally agreed.  Diabetes: Patient states she has been diabetic since 2008. Patient states she does not currently check her blood sugars at home. Patient states a glucometer has been ordered and she is waiting for it to arrive by mail.  Patient states she is on oral medication for her diabetes. Patient states she saw her primary MD last week, 03/28/17.  Patient states, "I believe my A1c was 9."  Patient states this is the highest her A1c has been. Patient states, "I have been eating snickers bars and I think that is causing my numbers to be elevated."    SOCIAL: Patient states she needs assistance with transportation. Patient states she is able to get to her appointments sometimes but there are times she has difficulty.  Patient states she would like to have meals on wheels because she has difficulty preparing meals for herself everyday.  Patient states she recently lost her brother in September 2018. Patient states she is still grieving from this especially at night and on cloudy/ rainy days.   ASSESSMENT:  PHQ2 - 2,  PHQ 9 - 5 Patient will benefit from referral to health coach and social worker.   PLAN: RNCN will refer patient to health coach and social worker.   Quinn Plowman RN,BSN,CCM [facility] Telephonic  [phone]

## 2017-04-04 ENCOUNTER — Ambulatory Visit: Payer: Medicare PPO | Admitting: Pharmacist

## 2017-04-05 ENCOUNTER — Other Ambulatory Visit: Payer: Self-pay

## 2017-04-05 NOTE — Patient Outreach (Signed)
[place] [facility]) Care Management  04/05/2017  Mallory Hayes July 24, 1945 [id number]   Telephone call placed to the patient for introduction. HIPAA verified. RN HC explained to the patient what our next telephone call will be like and we will be working together to help improve her diabetes.  The patient was on her way out to get supplies for the weather.  Plan:  RN Health Coach will outreach the patient  With in the next three business days.  [name] RN, BSN, [place] Direct Dial:  [phone] Fax: [phone]

## 2017-04-08 ENCOUNTER — Other Ambulatory Visit: Payer: Self-pay

## 2017-04-08 NOTE — Patient Outreach (Signed)
[place] [facility]) Care Management  04/08/2017  Hebe Merriwether [birth year] [id number]   1st Telephone call to patient for initial assessment.  No answer. HIPAA compliant voice message left with contact information.   Plan: RN Health Coach will attempt to make outreach call to the patient within three business days.  [name] RN, BSN, [place] Direct Dial:  [phone] Fax: [phone]

## 2017-04-08 NOTE — Patient Outreach (Signed)
[place] [facility]) Care Management  04/08/2017  Charmain Diosdado March 05, 1946 [id number]   Received call back from the patient. HIPAA verified. She stated that she is interested in the program but was unable to do the initial assessment today.  She would like for me to give her a call back on Wednesday.  The patient also wanted to know about the meals on wheels program that was set up for her by the social worker. RN Health Coach read the social work note but unsure when the program would start. RN Health Coach  placed a call to [place] Worker for more information.  Jerene Pitch stated that the patient will start her meals on wheel this Wednesday 04/10/2017 due to the weather.    RN Health Coach called the patient back and gave her the information and also notified her that Jerene Pitch has mailed information to the patient about meals on wheels program.  For more information please review [place]'s note.  [name] RN, BSN, [place] Direct Dial:  [phone] Fax: [phone]

## 2017-04-10 ENCOUNTER — Other Ambulatory Visit: Payer: Self-pay

## 2017-04-10 NOTE — Patient Outreach (Signed)
[place] [facility]) Care Management  04/10/2017  Ziomara Birenbaum 1945-12-12 [id number]   Telephone call to patient for initial assessment.  No answer. HIPAA compliant voice message left with contact information.  Plan:  RN Health Coach will make outreach attempt with the patient in the month of December.  [name] RN, BSN, [place] Direct Dial:  [phone] Fax: [phone]

## 2017-04-11 ENCOUNTER — Ambulatory Visit: Payer: Medicare PPO | Admitting: Pharmacist

## 2017-04-12 ENCOUNTER — Other Ambulatory Visit: Payer: Self-pay

## 2017-04-12 NOTE — Patient Outreach (Signed)
[place] [facility]) Care Management  04/12/2017  Allaya Abbasi 1946/03/30 [id number]  3rd  Telephone call to patient for initial assessment.  No answer.  HIPAA complaint voice message left with contact information.    Plan: RN Health Coach will send letter to attempt outreach.  If no response within ten business days will proceed with case closure.       [name] RN, BSN, [place] Direct Dial:  [phone] Fax: [phone]

## 2017-04-12 NOTE — Patient Outreach (Signed)
[facility]) Care Management  [place]  04/12/2017   Mallory Hayes Oct 15, 1945 [id number]  Subjective: Telephone call placed to the patient for initial assessment. HIPAA verified. The  patient states that she is doing ok.  She has had some stress because she is not able to get out and walk due to the weather. The patient states that she has had a family member to pass this year and she thinks about it a lot.  She feels that it has made her eat more which caused her a1c to go up.   The patient states that she an appointment to recheck her a1c on May 02, 2017. She is adherent with her medications. She stated that she has not had her meals on wheels delivered.  RNHC has contacted [employer] social worker and she will send and e-mail and call Oren Bracket to assess the situation.  Objective:   Encounter Medications:  Outpatient Encounter Medications as of 04/12/2017  Medication Sig  . ACCU-CHEK SOFTCLIX LANCETS lancets Use to check blood sugar once daily  . Alcohol Swabs (B-D SINGLE USE SWABS REGULAR) PADS Use to check blood sugar once daily  . aspirin 81 MG tablet Take 81 mg by mouth daily.    . Blood Glucose Calibration (ACCU-CHEK AVIVA) SOLN Use as needed to calibrate glucometer  . fluticasone (FLONASE) 50 MCG/ACT nasal spray Place 1 spray into both nostrils daily.  . hydrochlorothiazide (HYDRODIURIL) 12.5 MG tablet Take 1 tablet (12.5 mg total) by mouth daily.  . hydrOXYzine (ATARAX/VISTARIL) 25 MG tablet Take 1 tablet (25 mg total) by mouth 3 (three) times daily as needed for anxiety.  Marland Kitchen lisinopril (PRINIVIL,ZESTRIL) 20 MG tablet Take 1 tablet (20 mg total) by mouth daily.  . metFORMIN (GLUCOPHAGE-XR) 500 MG 24 hr tablet Take 2 tablets (1,000 mg total) by mouth 2 (two) times daily.  . polyethylene glycol powder (GLYCOLAX/MIRALAX) powder Take 17 g by mouth daily as needed. QS for 90 day supply  . rosuvastatin (CRESTOR) 20 MG tablet TAKE 1 TABLET BY MOUTH EVERY DAY   . vitamin E 200 UNIT capsule Take 200 Units by mouth daily.    . Blood Glucose Monitoring Suppl (ACCU-CHEK AVIVA PLUS) w/Device KIT Use to check blood sugar once daily  . Blood Pressure Monitoring (BLOOD PRESSURE CUFF) MISC 1 Device by Does not apply route daily. (Patient not taking: Reported on 04/12/2017)  . Blood Pressure Monitoring (BLOOD PRESSURE MONITOR AUTOMAT) DEVI Check blood pressure 3 times per week  . glucose blood (ACCU-CHEK AVIVA PLUS) test strip Use to check blood sugar once daily   No facility-administered encounter medications on file as of 04/12/2017.     Functional Status:  No flowsheet data found.  Fall/Depression Screening: Fall Risk  04/12/2017 12/20/2016 11/09/2016  Falls in the past year? No No No  Number falls in past yr: - - -  Injury with Fall? - - -  Risk Factor Category  - - -  Risk for fall due to : - - -  Follow up - - -   PHQ 2/9 Scores 04/12/2017 04/03/2017 12/20/2016 11/09/2016 06/15/2016 01/12/2016 11/03/2015  PHQ - 2 Score 1 2 0 1 0 1 2  PHQ- 9 Score - 5 - - - - 11    Assessment: Patient will benefit from health coach outreach for disease management and support.   THN CM Care Plan Problem One     Most Recent Value  Care Plan Problem One  Diabetes knowledge deficit  Role Documenting the Problem One  [place] for Problem One  Active  THN Long Term Goal   Patient will decrease A1c 0.2 points within 90 days.    THN Long Term Goal Start Date  04/12/17  Interventions for Problem One Long Term Goal  Discussed with patient complications of diabetes and importance of maintaining blood sugar control.    THN CM Short Term Goal #1   Patient will be able to report maintaining low carbohydrate diet within 30 days.   THN CM Short Term Goal #1 Start Date  04/12/17  Interventions for Short Term Goal #1  RN Health coach discussed with the patient about how diet can assist in lowering blood sugars  THN CM Short Term Goal #2   Patient will report  checking her blood sugars daily alternating times  THN CM Short Term Goal #2 Start Date  04/12/17  Interventions for Short Term Goal #2  [place] discussed with patient importance of checking blood sugar as a routine in diabetic care.        Plan: RN Health Coach will provide ongoing education for patient on diabetes through phone calls and sending printed information to patient for further discussion.  RN Health Coach will send welcome packet with consent to patient as well as printed information on diabetes.  RN Health Coach will send initial barriers letter, assessment, and care plan to primary care physician  [name] RN, BSN, [place]:  [phone] Fax: [phone]

## 2017-04-24 ENCOUNTER — Ambulatory Visit: Payer: Self-pay

## 2017-05-02 ENCOUNTER — Other Ambulatory Visit: Payer: Self-pay

## 2017-05-02 ENCOUNTER — Ambulatory Visit (INDEPENDENT_AMBULATORY_CARE_PROVIDER_SITE_OTHER): Payer: Medicare PPO | Admitting: Pharmacist

## 2017-05-02 ENCOUNTER — Encounter: Payer: Self-pay | Admitting: Pharmacist

## 2017-05-02 DIAGNOSIS — I10 Essential (primary) hypertension: Secondary | ICD-10-CM | POA: Diagnosis not present

## 2017-05-02 NOTE — Patient Outreach (Signed)
[facility]) Care Management  05/02/2017  Mallory Hayes 01/31/1946 [id number]   Received call from the patient today stating that she has not received information mailed to her about her condition.    Plan:  [facility] will have the information resent to the patient.  [name] RN, BSN, [place] Direct Dial:  [phone] Fax: [phone]

## 2017-05-02 NOTE — Progress Notes (Signed)
   S:    Patient arrives in good spirits, ambulating without assistance. Presents to the clinic for hypertension evaluation and new onset of symptoms of feeling "hot" which have occurred for the last 4-6 weeks.  Patient believes her symptoms are being caused by her lisinopril and/or metformin.     Patient reports adherence with medications including her metformin and lisinopril.   Current BP Medications include:  HCTZ and lisinopril  Currently takes HCTZ - early in the AM  Takes lisinopril and metformin at the same time in the AM (mid-morning).   Reports outside office BP of 169 systolic.   Denies orthostasis.    O:   Last 3 Office BP readings: BP Readings from Last 3 Encounters:  05/02/17 138/88  03/29/17 140/80  01/24/17 (!) 167/41    BMET    Component Value Date/Time   NA 139 12/20/2016 0954   K 3.6 12/20/2016 0954   CL 97 12/20/2016 0954   CO2 25 12/20/2016 0954   GLUCOSE 168 (H) 12/20/2016 0954   GLUCOSE 159 (H) 01/19/2016 1059   BUN 24 12/20/2016 0954   CREATININE 1.52 (H) 12/20/2016 0954   CREATININE 1.27 (H) 01/19/2016 1059   CALCIUM 10.2 12/20/2016 0954   GFRNONAA 34 (L) 12/20/2016 0954   GFRNONAA 43 (L) 01/19/2016 1059   GFRAA 40 (L) 12/20/2016 0954   GFRAA 50 (L) 01/19/2016 1059    A/P: Hypertension longstanding currently < 140/90 with use of HCTZ and Lisinopril.   Adjusted dose of Lisinopril from AM dosing to nigh time dosing.   Continued all other medications.   Discussed splitting the medications to determine if the medications were actually the cause of the "hot feeling".   Also discussed if this could be something else, possibility of thyroid dysfunction was discussed when she said her appetite was less recently.   Agreed to split the lisinopril dosing time from the metformin to assess any causal relationship.   Follow up in Rx clinic in two weeks.  Consider additional work-up including PCP visit at that time.   Complaint of anxiety previously managed with  chlorazepate (Tranxene) continues to be problematic.  Consideration of antidepressant (SSRI/SNRI) or buspirone in the future if PCP deems appropriate.    Results reviewed and written information provided.   Total time in face-to-face counseling 20 minutes.   F/U Clinic Visit with Rx Clinic.  Patient seen with Onnie Boer, PharmD Candidate and Deirdre Pippins, PGY2 Pharmacy Resident, PharmD, BCPS

## 2017-05-02 NOTE — Assessment & Plan Note (Signed)
Hypertension longstanding currently < 140/90 with use of HCTZ and Lisinopril.   Adjusted dose of Lisinopril from AM dosing to nigh time dosing.   Continued all other medications.   Discussed splitting the medications to determine if the medications were actually the cause of the "hot feeling".   Also discussed if this could be something else, possibility of thyroid dysfunction was discussed when she said her appetite was less recently.   Agreed to split the lisinopril dosing time from the metformin to assess any causal relationship.   Follow up in Rx clinic in two weeks.  Consider additional work-up including PCP visit at that time.

## 2017-05-02 NOTE — Patient Instructions (Addendum)
Great to see you today!  Please continue all of your current medications.   Please change the timing of your blood pressure pill (lisinopril) to night dosing.   Please return in 2 weeks to pharmacy clinic to share how this change in medications affects your "hot feeling"

## 2017-05-03 DIAGNOSIS — E119 Type 2 diabetes mellitus without complications: Secondary | ICD-10-CM | POA: Diagnosis not present

## 2017-05-03 DIAGNOSIS — I10 Essential (primary) hypertension: Secondary | ICD-10-CM | POA: Diagnosis not present

## 2017-05-03 DIAGNOSIS — Z87891 Personal history of nicotine dependence: Secondary | ICD-10-CM | POA: Diagnosis not present

## 2017-05-03 DIAGNOSIS — Z7982 Long term (current) use of aspirin: Secondary | ICD-10-CM | POA: Diagnosis not present

## 2017-05-03 DIAGNOSIS — Z7984 Long term (current) use of oral hypoglycemic drugs: Secondary | ICD-10-CM | POA: Diagnosis not present

## 2017-05-06 DIAGNOSIS — Z7982 Long term (current) use of aspirin: Secondary | ICD-10-CM | POA: Diagnosis not present

## 2017-05-06 DIAGNOSIS — I10 Essential (primary) hypertension: Secondary | ICD-10-CM | POA: Diagnosis not present

## 2017-05-06 DIAGNOSIS — Z7984 Long term (current) use of oral hypoglycemic drugs: Secondary | ICD-10-CM | POA: Diagnosis not present

## 2017-05-06 DIAGNOSIS — E119 Type 2 diabetes mellitus without complications: Secondary | ICD-10-CM | POA: Diagnosis not present

## 2017-05-06 DIAGNOSIS — Z87891 Personal history of nicotine dependence: Secondary | ICD-10-CM | POA: Diagnosis not present

## 2017-05-09 DIAGNOSIS — Z87891 Personal history of nicotine dependence: Secondary | ICD-10-CM | POA: Diagnosis not present

## 2017-05-09 DIAGNOSIS — I10 Essential (primary) hypertension: Secondary | ICD-10-CM | POA: Diagnosis not present

## 2017-05-09 DIAGNOSIS — Z7984 Long term (current) use of oral hypoglycemic drugs: Secondary | ICD-10-CM | POA: Diagnosis not present

## 2017-05-09 DIAGNOSIS — Z7982 Long term (current) use of aspirin: Secondary | ICD-10-CM | POA: Diagnosis not present

## 2017-05-09 DIAGNOSIS — E119 Type 2 diabetes mellitus without complications: Secondary | ICD-10-CM | POA: Diagnosis not present

## 2017-05-10 ENCOUNTER — Telehealth: Payer: Self-pay | Admitting: Family Medicine

## 2017-05-10 ENCOUNTER — Ambulatory Visit: Payer: Medicare PPO | Admitting: Family Medicine

## 2017-05-10 NOTE — Telephone Encounter (Addendum)
Called patient to apologize that we had to cancel her appointment for this morning. We were dealing with an emergency situation in the clinic and I was not able to see her. Her appointment was rescheduled for 1/25, but I offered to see patient before then and she was appreciative. I have scheduled her to see me on 1/16 at 2pm.   Of note patient reports her sugar was 208 this morning while fasting. Recommend she cut back on sweets, chips, breads etc in the meantime, and we will discuss more when I see her next week.  Patient appreciative.  [name], MD

## 2017-05-13 ENCOUNTER — Other Ambulatory Visit: Payer: Self-pay

## 2017-05-13 NOTE — Patient Outreach (Signed)
[facility]) Care Management  [place]  05/13/2017   Bora Bost [birth year] [id number]  Subjective: Telephone called placed to the patient for monthly assessment. HIPAA verified.  The patient states that she is feeling very good today.  She denies any pain or falls.  The patient checked her blood sugar last Friday fasting and it was 208. She states that it scared her and she has not checked it since.  RN Health Coach explained to the patient the benefits of checking her blood sugar. Patient verbalized understanding. She states that she needs to work on her diet. RN Health Coach discussed diet and will send the patient information on diet.  The patient states that she is exercising.  She walks, does some stretching.  The patient has an appointment with her physician this Wednesday 05-15-2017.  Objective:   Encounter Medications:  Outpatient Encounter Medications as of 05/13/2017  Medication Sig  . ACCU-CHEK SOFTCLIX LANCETS lancets Use to check blood sugar once daily  . Alcohol Swabs (B-D SINGLE USE SWABS REGULAR) PADS Use to check blood sugar once daily  . aspirin 81 MG tablet Take 81 mg by mouth daily.    . Blood Glucose Calibration (ACCU-CHEK AVIVA) SOLN Use as needed to calibrate glucometer  . Blood Glucose Monitoring Suppl (ACCU-CHEK AVIVA PLUS) w/Device KIT Use to check blood sugar once daily  . Blood Pressure Monitoring (BLOOD PRESSURE CUFF) MISC 1 Device by Does not apply route daily.  . Blood Pressure Monitoring (BLOOD PRESSURE MONITOR AUTOMAT) DEVI Check blood pressure 3 times per week  . glucose blood (ACCU-CHEK AVIVA PLUS) test strip Use to check blood sugar once daily  . hydrochlorothiazide (HYDRODIURIL) 12.5 MG tablet Take 1 tablet (12.5 mg total) by mouth daily.  Marland Kitchen lisinopril (PRINIVIL,ZESTRIL) 20 MG tablet Take 1 tablet (20 mg total) by mouth daily.  . metFORMIN (GLUCOPHAGE-XR) 500 MG 24 hr tablet Take 2 tablets (1,000 mg total) by mouth 2 (two) times  daily.  . mometasone (NASONEX) 50 MCG/ACT nasal spray Place 2 sprays into the nose daily as needed.  . polyethylene glycol powder (GLYCOLAX/MIRALAX) powder Take 17 g by mouth daily as needed. QS for 90 day supply  . rosuvastatin (CRESTOR) 20 MG tablet TAKE 1 TABLET BY MOUTH EVERY DAY  . vitamin E 200 UNIT capsule Take 200 Units by mouth daily.    . hydrOXYzine (ATARAX/VISTARIL) 25 MG tablet Take 1 tablet (25 mg total) by mouth 3 (three) times daily as needed for anxiety. (Patient not taking: Reported on 05/02/2017)   No facility-administered encounter medications on file as of 05/13/2017.     Functional Status:  No flowsheet data found.  Fall/Depression Screening: Fall Risk  05/13/2017 04/12/2017 12/20/2016  Falls in the past year? No No No  Number falls in past yr: - - -  Injury with Fall? - - -  Risk Factor Category  - - -  Risk for fall due to : - - -  Follow up - - -   PHQ 2/9 Scores 04/12/2017 04/03/2017 12/20/2016 11/09/2016 06/15/2016 01/12/2016 11/03/2015  PHQ - 2 Score 1 2 0 1 0 1 2  PHQ- 9 Score - 5 - - - - 11    Assessment: Patient continues to benefit from health coach outreach for disease management and support.   THN CM Care Plan Problem One     Most Recent Value  Care Plan Problem One  Diabetes knowledge deficit  Role Documenting the Problem One  Health Coach  Care Plan for Problem One  Active  THN Long Term Goal   Patient will decrease A1c 0.2 points within 90 days.    THN Long Term Goal Start Date  04/12/17  Interventions for Problem One Long Term Goal  reviewed with patient complications of diabetes and importance of maintaining blood sugar control.    THN CM Short Term Goal #1   Patient will be able to report maintaining low carbohydrate diet within 30 days.   THN CM Short Term Goal #1 Start Date  04/12/17  Interventions for Short Term Goal #1  RN Health Coach reinterated with the patient about how diet can assist in lowering blood sugars  THN CM Short Term Goal #2    Patient will report checking her blood sugars daily alternating times  THN CM Short Term Goal #2 Start Date  04/12/17  Interventions for Short Term Goal #2  RN Health Coach explained to the  patient importance of checking blood sugar as a routine in diabetic care.      Plan: RN Health Coach will contact patient in the month of February and patient agrees to next outreach.  [name] RN, BSN, [place] Direct Dial:  [phone] Fax: [phone]

## 2017-05-15 ENCOUNTER — Ambulatory Visit (INDEPENDENT_AMBULATORY_CARE_PROVIDER_SITE_OTHER): Payer: Medicare HMO | Admitting: Family Medicine

## 2017-05-15 ENCOUNTER — Other Ambulatory Visit: Payer: Self-pay

## 2017-05-15 ENCOUNTER — Encounter: Payer: Self-pay | Admitting: Family Medicine

## 2017-05-15 VITALS — BP 138/62 | HR 56 | Temp 98.7°F | Ht 61.0 in | Wt 114.0 lb

## 2017-05-15 DIAGNOSIS — R232 Flushing: Secondary | ICD-10-CM

## 2017-05-15 DIAGNOSIS — Z7984 Long term (current) use of oral hypoglycemic drugs: Secondary | ICD-10-CM | POA: Diagnosis not present

## 2017-05-15 DIAGNOSIS — E118 Type 2 diabetes mellitus with unspecified complications: Secondary | ICD-10-CM | POA: Diagnosis not present

## 2017-05-15 DIAGNOSIS — K5909 Other constipation: Secondary | ICD-10-CM | POA: Diagnosis not present

## 2017-05-15 DIAGNOSIS — K219 Gastro-esophageal reflux disease without esophagitis: Secondary | ICD-10-CM | POA: Diagnosis not present

## 2017-05-15 DIAGNOSIS — M62838 Other muscle spasm: Secondary | ICD-10-CM

## 2017-05-15 DIAGNOSIS — I10 Essential (primary) hypertension: Secondary | ICD-10-CM

## 2017-05-15 DIAGNOSIS — N950 Postmenopausal bleeding: Secondary | ICD-10-CM | POA: Diagnosis not present

## 2017-05-15 DIAGNOSIS — Z87891 Personal history of nicotine dependence: Secondary | ICD-10-CM | POA: Diagnosis not present

## 2017-05-15 DIAGNOSIS — Z7982 Long term (current) use of aspirin: Secondary | ICD-10-CM | POA: Diagnosis not present

## 2017-05-15 DIAGNOSIS — G4733 Obstructive sleep apnea (adult) (pediatric): Secondary | ICD-10-CM | POA: Diagnosis not present

## 2017-05-15 DIAGNOSIS — E119 Type 2 diabetes mellitus without complications: Secondary | ICD-10-CM | POA: Diagnosis not present

## 2017-05-15 MED ORDER — BACLOFEN 10 MG PO TABS: 5.0000 mg | ORAL_TABLET | Freq: Two times a day (BID) | ORAL | 0 refills | Status: DC | PRN

## 2017-05-15 MED ORDER — RANITIDINE HCL 150 MG PO TABS: 150.0000 mg | ORAL_TABLET | Freq: Two times a day (BID) | ORAL | 5 refills | Status: DC

## 2017-05-15 MED ORDER — TETANUS-DIPHTH-ACELL PERTUSSIS 5-2.5-18.5 LF-MCG/0.5 IM SUSP: 0.5000 mL | Freq: Once | INTRAMUSCULAR | 0 refills | Status: AC

## 2017-05-15 NOTE — Progress Notes (Signed)
Date of Visit: 05/15/2017   HPI:  Patient presents for routine follow up. She has several issues she wants to discuss today.  Thyroid checked - wants her thyroid checked. Has hot flashes up to 3 times per day, lasting 20 mins at a time. Also feels tired and has trouble sleeping. Was diagnosed with OSA but could not tolerate CPAP for a full 4 hours. Agreeable to scheduling follow up with sleep doctor to discuss other options (mouth guard etc).  R side face pain - has muscle tightness around her right temple. Happening about once per week, lasts 1 hour at a time. She attributes it to stress. Has taken tylenol and aspirin which help some. Tried hydroxyzine as well, but did not like how this made her feel. Her brother recently died, and she thinks the stress from that is the cause. Had similar symptoms after her father died years ago.  GERD-start taking Prilosec about 6 months concerns that it would bother her kidneys based on things she read.  Is having some reflux symptoms and wants to know what else she can take.  Constipation-taking MiraLAX about 3 times a week.  Only has bowel movements when she takes MiraLAX.  No blood in her stool.  Diabetes-currently taking metformin 1000 mg twice a day.  Has a home health nurse coming out who is been helping her eat healthier.  Sugars are now 99-111 fasting.  She is really watching what she is eating and feels like things are going better.  Vaginal bleeding -  on review of systems while discussing constipation patient reports that she has had some intermittent vaginal spotting.  She is postmenopausal and actually has had a prior hysterectomy.  Mom had a history of ovarian cancer.  Last episode of spotting was 3 weeks ago.  She is having it occur about every month.  Does not notice any correlation with intercourse.  Hypertension - taking lisinopril 20 mg daily and HCTZ 12.5 mg daily.  Tolerating these doing well with the addition of home health nurse well.   Denies chest pain or shortness of breath.  ROS: See HPI.  [place]: History of hypertension, type 2 diabetes, hyperlipidemia, constipation, acid reflux, OSA  PHYSICAL EXAM: BP 138/62   Pulse (!) 56   Temp 98.7 F (37.1 C) (Oral)   Ht _0  (1.549 m)   Wt 114 lb (51.7 kg)   SpO2 99%   BMI 21.54 kg/m  Gen: no acute distress, pleasant, cooperative HEENT: normocephalic, atraumatic, moist mucous membranes, no thyromegaly or noduels.  No temporal tenderness. Heart: regular rate and rhythm, no murmur Lungs: clear to auscultation bilaterally normal work of breathing  Abdomen: soft, nontender to palpation, no masses or organomegaly Neuro: alert, grossly nonfocal, speech normal. Ext: No appreciable lower extremity edema bilaterally   ASSESSMENT/PLAN:  Health maintenance:  -reminded patient to schedule upcoming eye exam as it is nearly due -given printed rx for Tdap -otherwise UTD on HM items  Right face pain Likely mild muscle spasm related to stress given history of the same.  I will check ESR with her lab work today to rule out temporal arteritis, however I have a low suspicion for this based on the history.  Trial of baclofen for muscle spasm.  She endorses being under stress and is willing to meet with a counselor but is not able to meet with them today, so I will ask [facility] to reach out to her.  Essential hypertension Well controlled. Continue current regimen.  Type 2 diabetes mellitus Doing well with home health nurse visiting her.  Follow-up with me in 1 month at which time will be time to recheck her A1c.  CONSTIPATION, CHRONIC Recommended she try daily use of MiraLAX, titrate as needed.  Esophageal reflux Start ranitidine for reflux symptoms she did not want to take Prilosec chronically due to concerns of her kidneys.  Postmenopausal bleeding Complex history involving vaginal spotting but has had prior hysterectomy.  Mom with history of ovarian cancer.  Given how many issues  addressed today, I think the best thing is in the examining her to see a gynecologist for workup and evaluation of this.  Patient agreeable, referral entered.  OSA (obstructive sleep apnea) Encouraged follow-up with Dr. Halford Chessman for OSA for to evaluate other options since she could not tolerate CPAP. As she reports fatigue and hot flashes I will also check thyroid function today.  FOLLOW UP: Follow up in 6 weeks for diabetes Schedule follow up with sleep specialist Referring to [place]. Ardelia Mems, [place]

## 2017-05-15 NOTE — Patient Instructions (Signed)
It was great to see you again today!  Constipation - try daily miralax  Muscle spasm in face - try baclofen 5mg  twice daily as needed, use caution as it might make you sleepy  Stress- will have one of our counselors contact you  Checking thyroid labs today, also kidney function   Acid reflux - sent in new medication called ranitidine  Sleep issues - schedule follow up with Dr. Halford Chessman  Bleeding after menopause - referring to gynecologist  Follow up with me in 6 weeks for your diabetes Call with any questions/concerns  Be well, Dr. Ardelia Mems

## 2017-05-16 ENCOUNTER — Ambulatory Visit: Payer: Medicare PPO | Admitting: Internal Medicine

## 2017-05-16 ENCOUNTER — Ambulatory Visit: Payer: Medicare PPO | Admitting: Pharmacist

## 2017-05-16 LAB — BASIC METABOLIC PANEL
BUN / CREAT RATIO: 19 (ref 12–28)
BUN: 27 mg/dL (ref 8–27)
CHLORIDE: 87 mmol/L — AB (ref 96–106)
CO2: 26 mmol/L (ref 20–29)
CREATININE: 1.41 mg/dL — AB (ref 0.57–1.00)
Calcium: 10.4 mg/dL — ABNORMAL HIGH (ref 8.7–10.3)
GFR calc Af Amer: 44 mL/min/{1.73_m2} — ABNORMAL LOW (ref >59)
GFR calc non Af Amer: 38 mL/min/{1.73_m2} — ABNORMAL LOW (ref >59)
GLUCOSE: 135 mg/dL — AB (ref 65–99)
Potassium: 3.5 mmol/L (ref 3.5–5.2)
SODIUM: 132 mmol/L — AB (ref 134–144)

## 2017-05-16 LAB — TSH: TSH: 1.54 u[IU]/mL (ref 0.450–4.500)

## 2017-05-16 LAB — SEDIMENTATION RATE: SED RATE: 33 mm/h (ref 0–40)

## 2017-05-16 LAB — T4, FREE: Free T4: 1.28 ng/dL (ref 0.82–1.77)

## 2017-05-16 LAB — T3: T3 TOTAL: 64 ng/dL — AB (ref 71–180)

## 2017-05-17 DIAGNOSIS — N950 Postmenopausal bleeding: Secondary | ICD-10-CM | POA: Insufficient documentation

## 2017-05-17 NOTE — Assessment & Plan Note (Signed)
Encouraged follow-up with Dr. Halford Chessman for OSA for to evaluate other options since she could not tolerate CPAP. As she reports fatigue and hot flashes I will also check thyroid function today.

## 2017-05-17 NOTE — Assessment & Plan Note (Signed)
Start ranitidine for reflux symptoms she did not want to take Prilosec chronically due to concerns of her kidneys.

## 2017-05-17 NOTE — Assessment & Plan Note (Signed)
Recommended she try daily use of MiraLAX, titrate as needed.

## 2017-05-17 NOTE — Assessment & Plan Note (Signed)
Well-controlled.  Continue current regimen. 

## 2017-05-17 NOTE — Assessment & Plan Note (Addendum)
Complex history involving vaginal spotting but has had prior hysterectomy.  Mom with history of ovarian cancer.  Given how many issues addressed today, I think the best thing is in the examining her to see a gynecologist for workup and evaluation of this.  Patient agreeable, referral entered.

## 2017-05-17 NOTE — Assessment & Plan Note (Signed)
Doing well with home health nurse visiting her.  Follow-up with me in 1 month at which time will be time to recheck her A1c.

## 2017-05-20 ENCOUNTER — Encounter: Payer: Self-pay | Admitting: Psychology

## 2017-05-20 NOTE — Progress Notes (Signed)
At the request of Dr. Ardelia Mems, I called to set up an appointment for this patient. No one answered so I left a neutral phone message with contact info for [facility].

## 2017-05-21 ENCOUNTER — Other Ambulatory Visit: Payer: Self-pay

## 2017-05-21 NOTE — Patient Outreach (Signed)
[facility]) Care Management  05/21/2017  Mallory Hayes 1946/04/19 [id number]   Received call from the patient asking for help with resources.  The patient states that her doctor wrote her a prescription for a blood pressure cuff.  The pharmacy did not have the blood pressure cuff and she has no money to pay for one over the counter.  Also, she states that the doctor wants her to take Glucerna and she can't afford to pay for it and would like to see if I can help her. After doing some research I was able to acquire some coupons and a blood pressure cuff for the patient.  I informed the patient of this information.  [name] RN, BSN, [place] Direct Dial:  [phone] Fax: [phone]

## 2017-05-22 DIAGNOSIS — Z7982 Long term (current) use of aspirin: Secondary | ICD-10-CM | POA: Diagnosis not present

## 2017-05-22 DIAGNOSIS — Z87891 Personal history of nicotine dependence: Secondary | ICD-10-CM | POA: Diagnosis not present

## 2017-05-22 DIAGNOSIS — E119 Type 2 diabetes mellitus without complications: Secondary | ICD-10-CM | POA: Diagnosis not present

## 2017-05-22 DIAGNOSIS — Z7984 Long term (current) use of oral hypoglycemic drugs: Secondary | ICD-10-CM | POA: Diagnosis not present

## 2017-05-22 DIAGNOSIS — I10 Essential (primary) hypertension: Secondary | ICD-10-CM | POA: Diagnosis not present

## 2017-05-28 DIAGNOSIS — Z7982 Long term (current) use of aspirin: Secondary | ICD-10-CM | POA: Diagnosis not present

## 2017-05-28 DIAGNOSIS — E119 Type 2 diabetes mellitus without complications: Secondary | ICD-10-CM | POA: Diagnosis not present

## 2017-05-28 DIAGNOSIS — I10 Essential (primary) hypertension: Secondary | ICD-10-CM | POA: Diagnosis not present

## 2017-05-28 DIAGNOSIS — Z7984 Long term (current) use of oral hypoglycemic drugs: Secondary | ICD-10-CM | POA: Diagnosis not present

## 2017-05-28 DIAGNOSIS — Z87891 Personal history of nicotine dependence: Secondary | ICD-10-CM | POA: Diagnosis not present

## 2017-06-04 ENCOUNTER — Ambulatory Visit (INDEPENDENT_AMBULATORY_CARE_PROVIDER_SITE_OTHER): Payer: Medicare HMO | Admitting: Podiatry

## 2017-06-04 ENCOUNTER — Encounter: Payer: Self-pay | Admitting: Podiatry

## 2017-06-04 DIAGNOSIS — B351 Tinea unguium: Secondary | ICD-10-CM

## 2017-06-04 DIAGNOSIS — E119 Type 2 diabetes mellitus without complications: Secondary | ICD-10-CM

## 2017-06-04 DIAGNOSIS — M79676 Pain in unspecified toe(s): Secondary | ICD-10-CM

## 2017-06-04 NOTE — Progress Notes (Signed)
Patient ID: Mallory Hayes, female   DOB: November 12, 1945, 72 y.o.   MRN: [id number] Complaint:  Visit Type: Patient returns to my office for continued preventative foot care services. Complaint: Patient states" my nails have grown long and thick and become painful to walk and wear shoes" Patient has been diagnosed with DM with no foot complications. The patient presents for preventative foot care services. No changes to ROS  Podiatric Exam: Vascular: dorsalis pedis and posterior tibial pulses are palpable bilateral. Capillary return is immediate. Temperature gradient is WNL. Skin turgor WNL  Sensorium: Normal Semmes Weinstein monofilament test. Normal tactile sensation bilaterally. Nail Exam: Pt has thick disfigured discolored nails with subungual debris noted bilateral entire nail hallux through fifth toenails Ulcer Exam: There is no evidence of ulcer or pre-ulcerative changes or infection. Orthopedic Exam: Muscle tone and strength are WNL. No limitations in general ROM. No crepitus or effusions noted. HAV  B/L and tailors bunion  B/L Skin: No Porokeratosis. No infection or ulcers  Diagnosis:  Onychomycosis, , Pain in right toe, pain in left toes  Treatment & Plan Procedures and Treatment: Consent by patient was obtained for treatment procedures. The patient understood the discussion of treatment and procedures well. All questions were answered thoroughly reviewed. Debridement of mycotic and hypertrophic toenails, 1 through 5 bilateral and clearing of subungual debris. No ulceration, no infection noted.   Return Visit-Office Procedure: Patient instructed to return to the office for a follow up visit 3 months for continued evaluation and treatment.   Gardiner Barefoot DPM

## 2017-06-07 ENCOUNTER — Encounter: Payer: Self-pay | Admitting: Obstetrics & Gynecology

## 2017-06-14 ENCOUNTER — Other Ambulatory Visit: Payer: Self-pay

## 2017-06-14 NOTE — Patient Outreach (Signed)
[facility]) Care Management  [place]  06/14/2017   Mallory Hayes [birth year] [id number]  Subjective: Telephone call to the patient for monthly assessment. HIPAA Verified.  The patient states that she is doing well. Denies any pain or falls. The patient states that she is adherent with her medications.  She has not been walking due to the weather but will continue when allowed.  The patient states that she has not checked her blood sugars in a week because she has gotten off track.  RN Health Coach expressed to the patient the importance of checking her blood sugars.  Patient verbalized understanding.  The patient states that she is aware of what she eats and watching her fluid and food intake .  The patient has an appointment with the her primary care 06-25-2017. The patient states the she received the blood pressure monitor and coupons for Ensure. Objective:   Encounter Medications:  Outpatient Encounter Medications as of 06/14/2017  Medication Sig  . ACCU-CHEK SOFTCLIX LANCETS lancets Use to check blood sugar once daily  . Alcohol Swabs (B-D SINGLE USE SWABS REGULAR) PADS Use to check blood sugar once daily  . aspirin 81 MG tablet Take 81 mg by mouth daily.    . Blood Glucose Calibration (ACCU-CHEK AVIVA) SOLN Use as needed to calibrate glucometer  . Blood Glucose Monitoring Suppl (ACCU-CHEK AVIVA PLUS) w/Device KIT Use to check blood sugar once daily  . Blood Pressure Monitoring (BLOOD PRESSURE CUFF) MISC 1 Device by Does not apply route daily.  . Blood Pressure Monitoring (BLOOD PRESSURE MONITOR AUTOMAT) DEVI Check blood pressure 3 times per week  . glucose blood (ACCU-CHEK AVIVA PLUS) test strip Use to check blood sugar once daily  . hydrochlorothiazide (HYDRODIURIL) 12.5 MG tablet Take 1 tablet (12.5 mg total) by mouth daily.  Marland Kitchen lisinopril (PRINIVIL,ZESTRIL) 20 MG tablet Take 1 tablet (20 mg total) by mouth daily.  . metFORMIN (GLUCOPHAGE-XR) 500 MG 24 hr tablet  Take 2 tablets (1,000 mg total) by mouth 2 (two) times daily.  . mometasone (NASONEX) 50 MCG/ACT nasal spray Place 2 sprays into the nose daily as needed.  . polyethylene glycol powder (GLYCOLAX/MIRALAX) powder Take 17 g by mouth daily as needed. QS for 90 day supply  . ranitidine (ZANTAC) 150 MG tablet Take 1 tablet (150 mg total) by mouth 2 (two) times daily.  . rosuvastatin (CRESTOR) 20 MG tablet TAKE 1 TABLET BY MOUTH EVERY DAY  . vitamin E 200 UNIT capsule Take 200 Units by mouth daily.    . baclofen (LIORESAL) 10 MG tablet Take 0.5 tablets (5 mg total) by mouth 2 (two) times daily as needed for muscle spasms. (Patient not taking: Reported on 06/14/2017)   No facility-administered encounter medications on file as of 06/14/2017.     Functional Status:  No flowsheet data found.  Fall/Depression Screening: Fall Risk  06/14/2017 05/15/2017 05/13/2017  Falls in the past year? No No No  Number falls in past yr: - - -  Injury with Fall? - - -  Risk Factor Category  - - -  Risk for fall due to : - - -  Follow up - - -   PHQ 2/9 Scores 05/15/2017 04/12/2017 04/03/2017 12/20/2016 11/09/2016 06/15/2016 01/12/2016  PHQ - 2 Score 0 1 2 0 1 0 1  PHQ- 9 Score - - 5 - - - -    Assessment: Patient continues to benefit from health coach outreach for disease management and support.  THN CM Care Plan Problem One     Most Recent Value  THN Long Term Goal Start Date  06/14/17  Interventions for Problem One Long Term Goal  Talked with the patient about condition and lowering her a1c  THN CM Short Term Goal #1   Patient will be able to report maintaining low carbohydrate diet within 30 days.   THN CM Short Term Goal #1 Start Date  06/14/17  Interventions for Short Term Goal #1  Rntalked with the patient about the choices that she makes with her food intake.  THN CM Short Term Goal #2   Patient will report checking her blood sugars daily alternating times  THN CM Short Term Goal #2 Start Date  06/14/17   Interventions for Short Term Goal #2  RN Health Coach reinterated  to the  patient importance of checking blood sugar as a routine in diabetic care.      Plan:  RN Health Coach will contact patient in the month of March and patient agrees to next outreach.  [name] RN, BSN, [place] Direct Dial:  [phone] Fax: [phone]

## 2017-06-17 ENCOUNTER — Other Ambulatory Visit (HOSPITAL_COMMUNITY)
Admission: RE | Admit: 2017-06-17 | Discharge: 2017-06-17 | Disposition: A | Discharge status: Home / Self Care | Payer: Medicare HMO | Source: Ambulatory Visit | Attending: Obstetrics & Gynecology | Admitting: Obstetrics & Gynecology

## 2017-06-17 ENCOUNTER — Encounter: Payer: Self-pay | Admitting: Obstetrics & Gynecology

## 2017-06-17 ENCOUNTER — Ambulatory Visit: Payer: Medicare HMO | Admitting: Obstetrics & Gynecology

## 2017-06-17 VITALS — BP 147/78 | HR 68 | Wt 114.0 lb

## 2017-06-17 DIAGNOSIS — N952 Postmenopausal atrophic vaginitis: Secondary | ICD-10-CM | POA: Diagnosis not present

## 2017-06-17 DIAGNOSIS — Z8041 Family history of malignant neoplasm of ovary: Secondary | ICD-10-CM

## 2017-06-17 MED ORDER — ESTRADIOL 0.1 MG/GM VA CREA: 0.5000 | TOPICAL_CREAM | Freq: Every day | VAGINAL | 2 refills | Status: DC

## 2017-06-17 NOTE — Patient Instructions (Signed)
Atrophic Vaginitis Atrophic vaginitis is when the tissues that line the vagina become dry and thin. This is caused by a drop in estrogen. Estrogen helps:  To keep the vagina moist.  To make a clear fluid that helps: ? To lubricate the vagina for sex. ? To protect the vagina from infection.  If the lining of the vagina is dry and thin, it may:  Make sex painful. It may also cause bleeding.  Cause a feeling of: ? Burning. ? Irritation. ? Itchiness.  Make an exam of your vagina painful. It may also cause bleeding.  Make you lose interest in sex.  Cause a burning feeling when you pee.  Make your vaginal fluid (discharge) brown or yellow.  For some women, there are no symptoms. This condition is most common in women who do not get their regular menstrual periods anymore (menopause). This often starts when a woman is 45-55 years old. Follow these instructions at home:  Take medicines only as told by your doctor. Do not use any herbal or alternative medicines unless your doctor says it is okay.  Use over-the-counter products for dryness only as told by your doctor. These include: ? Creams. ? Lubricants. ? Moisturizers.  Do not douche.  Do not use products that can make your vagina dry. These include: ? Scented feminine sprays. ? Scented tampons. ? Scented soaps.  If it hurts to have sex, tell your sexual partner. Contact a doctor if:  Your discharge looks different than normal.  Your vagina has an unusual smell.  You have new symptoms.  Your symptoms do not get better with treatment.  Your symptoms get worse. This information is not intended to replace advice given to you by your health care provider. Make sure you discuss any questions you have with your health care provider. Document Released: 10/03/2007 Document Revised: 09/22/2015 Document Reviewed: 04/07/2014 Elsevier Interactive Patient Education  2018 Elsevier Inc.  

## 2017-06-17 NOTE — Progress Notes (Signed)
vaPt stated vaginal area have tightness, dry, brown spotting of the vagina,   for about over 1 year. Also, hot flashes for over 1 year

## 2017-06-17 NOTE — Progress Notes (Signed)
Patient ID: Mallory Hayes, female   DOB: [birth year], 72 y.o.   MRN: [id number]  Cc: vaginal discharge  HPI Mallory Hayes is a 72 y.o. female.  G0P0000 S/p TAH and unilateral SO 1991 for fibroids. She has had hot flushes and brown vaginal discharge and dryness. She is sexually active with dyspareunia due to dryness HPI  Past Medical History:  Diagnosis Date  . Diabetes mellitus   . GERD (gastroesophageal reflux disease)   . Hypertension   . OSA (obstructive sleep apnea)   . Sleep apnea   . Urinary incontinence     Past Surgical History:  Procedure Laterality Date  . APPENDECTOMY    . BREAST EXCISIONAL BIOPSY Right    benign  . CHOLECYSTECTOMY    . COLONOSCOPY  2011  . PARTIAL HYSTERECTOMY  1991  TAH and SO 1991 Dr Tammi Klippel at [facility]  Family History  Problem Relation Age of Onset  . Cancer Mother   . Hypertension Mother   . Diabetes Mother 10  . Colon cancer Mother 29  . Ovarian cancer Mother   . Hyperlipidemia Mother   . Cancer Father   . Prostatitis Brother   . Diabetes Brother     Social History Social History   Tobacco Use  . Smoking status: Never Smoker  . Smokeless tobacco: Never Used  Substance Use Topics  . Alcohol use: No  . Drug use: No    Allergies  Allergen Reactions  . Amaryl [Glimepiride] Anxiety  . Food Itching    Spices, peppers  . Sulfamethoxazole Itching, Rash and Other (See Comments)    sneezing    Current Outpatient Medications  Medication Sig Dispense Refill  . ACCU-CHEK SOFTCLIX LANCETS lancets Use to check blood sugar once daily 100 each 12  . Alcohol Swabs (B-D SINGLE USE SWABS REGULAR) PADS Use to check blood sugar once daily 100 each 3  . aspirin 81 MG tablet Take 81 mg by mouth daily.      . baclofen (LIORESAL) 10 MG tablet Take 0.5 tablets (5 mg total) by mouth 2 (two) times daily as needed for muscle spasms. 30 each 0  . Blood Glucose Calibration (ACCU-CHEK AVIVA) SOLN Use as needed to calibrate glucometer 1 each 0  . Blood  Glucose Monitoring Suppl (ACCU-CHEK AVIVA PLUS) w/Device KIT Use to check blood sugar once daily 1 kit 0  . Blood Pressure Monitoring (BLOOD PRESSURE CUFF) MISC 1 Device by Does not apply route daily. 1 each 0  . Blood Pressure Monitoring (BLOOD PRESSURE MONITOR AUTOMAT) DEVI Check blood pressure 3 times per week 1 Device 0  . glucose blood (ACCU-CHEK AVIVA PLUS) test strip Use to check blood sugar once daily 100 each 12  . hydrochlorothiazide (HYDRODIURIL) 12.5 MG tablet Take 1 tablet (12.5 mg total) by mouth daily. 90 tablet 0  . lisinopril (PRINIVIL,ZESTRIL) 20 MG tablet Take 1 tablet (20 mg total) by mouth daily. 30 tablet 0  . metFORMIN (GLUCOPHAGE-XR) 500 MG 24 hr tablet Take 2 tablets (1,000 mg total) by mouth 2 (two) times daily. 60 tablet 1  . mometasone (NASONEX) 50 MCG/ACT nasal spray Place 2 sprays into the nose daily as needed.    . polyethylene glycol powder (GLYCOLAX/MIRALAX) powder Take 17 g by mouth daily as needed. QS for 90 day supply 255 g 2  . ranitidine (ZANTAC) 150 MG tablet Take 1 tablet (150 mg total) by mouth 2 (two) times daily. 60 tablet 5  . rosuvastatin (CRESTOR) 20 MG tablet TAKE 1 TABLET  BY MOUTH EVERY DAY 90 tablet 2  . vitamin E 200 UNIT capsule Take 200 Units by mouth daily.      Marland Kitchen estradiol (ESTRACE VAGINAL) 0.1 MG/GM vaginal cream Place 0.5 Applicatorfuls vaginally daily. For 14 days then three times a week for total of 8 weeks 42.5 g 2   No current facility-administered medications for this visit.     Review of Systems Review of Systems  Constitutional: Negative.   Gastrointestinal: Positive for constipation. Negative for blood in stool.  Endocrine:       VMS  Genitourinary: Positive for dyspareunia, vaginal bleeding (brown) and vaginal discharge.    Blood pressure (!) 147/78, pulse 68, weight 114 lb (51.7 kg).  Physical Exam Physical Exam  Constitutional: She appears well-developed. No distress.  HENT:  Head: Normocephalic.  Abdominal: She  exhibits no distension. There is no tenderness.  Genitourinary: Guaiac stool: brown/yellow  Vaginal discharge found.  Genitourinary Comments: Vaginal atrophy noted no mass or tenderness, cuff intact  Vitals reviewed.   Data Reviewed [facility] notes  Assessment    Suspect atrophic vaginitis FH ovarian cancer in her mother, pt is concerned    Plan    Estrace cream 1/2 applicator for 8 weeks Vaginal [place] for evaluation for pelvic mass RTC prn 6 months       Emeterio Reeve 06/17/2017, 2:11 PM

## 2017-06-18 LAB — CERVICOVAGINAL ANCILLARY ONLY
BACTERIAL VAGINITIS: NEGATIVE
Candida vaginitis: NEGATIVE
Chlamydia: NEGATIVE
Neisseria Gonorrhea: NEGATIVE
Trichomonas: NEGATIVE

## 2017-06-19 ENCOUNTER — Ambulatory Visit (HOSPITAL_COMMUNITY): Payer: Medicare HMO

## 2017-06-24 ENCOUNTER — Telehealth: Payer: Self-pay

## 2017-06-24 NOTE — Telephone Encounter (Signed)
Called pt to inform her that her test results were negative. Pt verbalized understanding and had no questions.

## 2017-06-25 ENCOUNTER — Ambulatory Visit (INDEPENDENT_AMBULATORY_CARE_PROVIDER_SITE_OTHER): Payer: Medicare HMO | Admitting: Family Medicine

## 2017-06-25 VITALS — BP 130/80 | HR 52 | Temp 97.6°F | Wt 112.0 lb

## 2017-06-25 DIAGNOSIS — E1169 Type 2 diabetes mellitus with other specified complication: Secondary | ICD-10-CM | POA: Diagnosis not present

## 2017-06-25 LAB — POCT GLYCOSYLATED HEMOGLOBIN (HGB A1C): HEMOGLOBIN A1C: 8.4

## 2017-06-25 NOTE — Progress Notes (Signed)
Date of Visit: 06/25/2017   HPI:  Patient presents for follow up of diabetes.  Diabetes -currently taking metformin xr 1000 mg twice a day.  Tolerating this well.  Denies any low sugars.  Back in January she made some significant dietary changes and now her sugars are doing quite well.  She is very pleased to hear that her A1c has improved to 8.4.  Feels much more comfortable with monitoring her diet and limiting carb intake after having home health work with her last month.  She is otherwise doing well and has no complaints.  ROS: See HPI.  [place]: History of type 2 diabetes, hypertension, GERD, chronic constipation, hyperlipidemia, OSA  PHYSICAL EXAM: BP 130/80 (BP Location: Right Arm, Patient Position: Sitting, Cuff Size: Normal)   Pulse (!) 52   Temp 97.6 F (36.4 C) (Oral)   Wt 112 lb (50.8 kg)   SpO2 99%   BMI 21.16 kg/m  Gen: No acute distress, pleasant, cooperative HEENT: normocephalic, atraumatic  Heart: Regular rate and rhythm Lungs: Clear bilaterally Neuro: Alert, grossly nonfocal Ext: No edema  ASSESSMENT/PLAN:  Health maintenance:  -Has upcoming eye exam scheduled for later this week -Reminded patient to get tetanus shot, she is shopping around for the best price.  Type 2 diabetes mellitus Good improvement in A1c to 8.4 from 9.7.  Patient is doing well making dietary changes.  I suspect we will recheck her A1c in 3 months is going to be lower.  Follow-up in 3 months for next check.  Patient has upcoming eye exam.  FOLLOW UP: Follow up in 3 months for diabetes  [place] J. Ardelia Mems, [place]

## 2017-06-25 NOTE — Patient Instructions (Signed)
Great job getting your A1c down!  Continue on this path. We'll recheck A1c in 3 months.  Work on getting the tetanus shot.  Be well, Dr. Ardelia Mems

## 2017-06-27 NOTE — Assessment & Plan Note (Signed)
Good improvement in A1c to 8.4 from 9.7.  Patient is doing well making dietary changes.  I suspect we will recheck her A1c in 3 months is going to be lower.  Follow-up in 3 months for next check.  Patient has upcoming eye exam.

## 2017-06-28 ENCOUNTER — Ambulatory Visit (HOSPITAL_COMMUNITY)
Admission: RE | Admit: 2017-06-28 | Discharge: 2017-06-28 | Disposition: A | Discharge status: Home / Self Care | Payer: Medicare HMO | Source: Ambulatory Visit | Attending: Obstetrics & Gynecology | Admitting: Obstetrics & Gynecology

## 2017-06-28 ENCOUNTER — Telehealth: Payer: Self-pay | Admitting: Family Medicine

## 2017-06-28 DIAGNOSIS — I10 Essential (primary) hypertension: Secondary | ICD-10-CM | POA: Diagnosis not present

## 2017-06-28 DIAGNOSIS — N939 Abnormal uterine and vaginal bleeding, unspecified: Secondary | ICD-10-CM | POA: Diagnosis not present

## 2017-06-28 DIAGNOSIS — N952 Postmenopausal atrophic vaginitis: Secondary | ICD-10-CM | POA: Insufficient documentation

## 2017-06-28 DIAGNOSIS — E119 Type 2 diabetes mellitus without complications: Secondary | ICD-10-CM | POA: Diagnosis not present

## 2017-06-28 DIAGNOSIS — E871 Hypo-osmolality and hyponatremia: Secondary | ICD-10-CM

## 2017-06-28 NOTE — Telephone Encounter (Signed)
Called patient because I did not discuss with her at her visit on Tuesday that I'd like to recheck her BMET as sodium was a little low at last check. Lab visit scheduled for next Thursday. Patient very appreciative.  [name], MD

## 2017-07-04 ENCOUNTER — Other Ambulatory Visit: Payer: Medicare HMO

## 2017-07-04 ENCOUNTER — Telehealth: Payer: Self-pay

## 2017-07-04 DIAGNOSIS — E1169 Type 2 diabetes mellitus with other specified complication: Secondary | ICD-10-CM | POA: Diagnosis not present

## 2017-07-04 NOTE — Telephone Encounter (Signed)
Called pt to give [place] results. Per Dr. Roselie Awkward, the [place] results from 06/28/17 were normal. Pt asked me to call her back because she was in the store and had a hard time hearing me. Will call pt back in 1 hour.

## 2017-07-05 ENCOUNTER — Other Ambulatory Visit: Payer: Self-pay

## 2017-07-05 LAB — CMP14+EGFR
A/G RATIO: 1.5 (ref 1.2–2.2)
ALT: 12 IU/L (ref 0–32)
AST: 19 IU/L (ref 0–40)
Albumin: 4.5 g/dL (ref 3.5–4.8)
Alkaline Phosphatase: 71 IU/L (ref 39–117)
BUN/Creatinine Ratio: 19 (ref 12–28)
BUN: 23 mg/dL (ref 8–27)
Bilirubin Total: 0.3 mg/dL (ref 0.0–1.2)
CALCIUM: 10.3 mg/dL (ref 8.7–10.3)
CHLORIDE: 95 mmol/L — AB (ref 96–106)
CO2: 29 mmol/L (ref 20–29)
Creatinine, Ser: 1.2 mg/dL — ABNORMAL HIGH (ref 0.57–1.00)
GFR calc Af Amer: 53 mL/min/{1.73_m2} — ABNORMAL LOW (ref >59)
GFR, EST NON AFRICAN AMERICAN: 46 mL/min/{1.73_m2} — AB (ref >59)
GLUCOSE: 207 mg/dL — AB (ref 65–99)
Globulin, Total: 3 g/dL (ref 1.5–4.5)
POTASSIUM: 3.5 mmol/L (ref 3.5–5.2)
Sodium: 139 mmol/L (ref 134–144)
TOTAL PROTEIN: 7.5 g/dL (ref 6.0–8.5)

## 2017-07-05 LAB — LIPID PANEL
CHOL/HDL RATIO: 2.1 ratio (ref 0.0–4.4)
Cholesterol, Total: 146 mg/dL (ref 100–199)
HDL: 70 mg/dL (ref >39)
LDL Calculated: 61 mg/dL (ref 0–99)
TRIGLYCERIDES: 77 mg/dL (ref 0–149)
VLDL CHOLESTEROL CAL: 15 mg/dL (ref 5–40)

## 2017-07-05 NOTE — Patient Outreach (Signed)
[facility]) Care Management  07/05/2017  Khiya Friese 1946-02-09 [id number]   Received a call from the patient today but  she did not leave a reason for the call.   RN Health Coach returned call. No answer.  HIPAA compliant message left with contact information.  Plan:  RN Health Coach will wait for the patient to return the call if not I will make an outreach attempt at the regular interval.  [name] RN, BSN, [place]:  [phone] Fax: [phone]

## 2017-07-05 NOTE — Patient Outreach (Signed)
[facility]) Care Management    07/05/2017   Mallory Hayes July 04, 1945 [id number]  Subjective: Received call from the patient reporting her medical information. HIPAA verified. The patient called to let me know that she saw her physician Dr Ardelia Mems  and her blood pressure was 130/80.  Her ac went form 9.7 to 8.4. The patient had not checked her blood sugar today. RN Health Coach discussed with the patient about checking her blood sugars regularly, her food choices and sodium intake. The patient verbalized understanding. The patient denies any pain or falls.  She adherent with her medications. The patient only walks to exercise when the weather allows.   The patient asked for a resource  for a dental problem that she has.  She states that she had spoken with her insurance company and they only  cover cleaning her teeth.  She has a crack in her tooth and can't afford high cost.  Lorenzo gave the patient information about the [facility] dental school.  The patient verbalized understanding    Encounter Medications:  Outpatient Encounter Medications as of 07/05/2017  Medication Sig  . ACCU-CHEK SOFTCLIX LANCETS lancets Use to check blood sugar once daily  . Alcohol Swabs (B-D SINGLE USE SWABS REGULAR) PADS Use to check blood sugar once daily  . aspirin 81 MG tablet Take 81 mg by mouth daily.    . baclofen (LIORESAL) 10 MG tablet Take 0.5 tablets (5 mg total) by mouth 2 (two) times daily as needed for muscle spasms.  . Blood Glucose Calibration (ACCU-CHEK AVIVA) SOLN Use as needed to calibrate glucometer  . Blood Glucose Monitoring Suppl (ACCU-CHEK AVIVA PLUS) w/Device KIT Use to check blood sugar once daily  . Blood Pressure Monitoring (BLOOD PRESSURE CUFF) MISC 1 Device by Does not apply route daily.  . Blood Pressure Monitoring (BLOOD PRESSURE MONITOR AUTOMAT) DEVI Check blood pressure 3 times per week  . glucose blood (ACCU-CHEK AVIVA PLUS) test strip Use to check blood sugar  once daily  . hydrochlorothiazide (HYDRODIURIL) 12.5 MG tablet Take 1 tablet (12.5 mg total) by mouth daily.  Marland Kitchen lisinopril (PRINIVIL,ZESTRIL) 20 MG tablet Take 1 tablet (20 mg total) by mouth daily.  . metFORMIN (GLUCOPHAGE-XR) 500 MG 24 hr tablet Take 2 tablets (1,000 mg total) by mouth 2 (two) times daily.  . mometasone (NASONEX) 50 MCG/ACT nasal spray Place 2 sprays into the nose daily as needed.  . polyethylene glycol powder (GLYCOLAX/MIRALAX) powder Take 17 g by mouth daily as needed. QS for 90 day supply  . ranitidine (ZANTAC) 150 MG tablet Take 1 tablet (150 mg total) by mouth 2 (two) times daily.  . rosuvastatin (CRESTOR) 20 MG tablet TAKE 1 TABLET BY MOUTH EVERY DAY  . vitamin E 200 UNIT capsule Take 200 Units by mouth daily.    Marland Kitchen estradiol (ESTRACE VAGINAL) 0.1 MG/GM vaginal cream Place 0.5 Applicatorfuls vaginally daily. For 14 days then three times a week for total of 8 weeks (Patient not taking: Reported on 07/05/2017)   No facility-administered encounter medications on file as of 07/05/2017.     Functional Status:  No flowsheet data found.  Fall/Depression Screening: Fall Risk  07/05/2017 06/14/2017 05/15/2017  Falls in the past year? No No No  Number falls in past yr: - - -  Injury with Fall? - - -  Risk Factor Category  - - -  Risk for fall due to : - - -  Follow up - - -   [facility] 2/9  Scores 05/15/2017 04/12/2017 04/03/2017 12/20/2016 11/09/2016 06/15/2016 01/12/2016  PHQ - 2 Score 0 1 2 0 1 0 1  PHQ- 9 Score - - 5 - - - -    Assessment: Patient continues to benefit from health coach outreach for disease management and support.    THN CM Care Plan Problem One     Most Recent Value  THN Long Term Goal   Patient will decrease A1c 0.2 points within 90 days.    THN Long Term Goal Start Date  07/05/17  Interventions for Problem One Long Term Goal  Discussed diet with the  patient and how it affects her a1c  THN CM Short Term Goal #1   Patient will be able to report maintaining low  carbohydrate diet within 30 days.   THN CM Short Term Goal #1 Start Date  07/05/17  Interventions for Short Term Goal #1  [facility] discussed with the patient about her food choices  THN CM Short Term Goal #2   Patient will report checking her blood sugars daily alternating times  THN CM Short Term Goal #2 Start Date  07/05/17  Interventions for Short Term Goal #2  [place] talked about the  patient importance of checking blood sugar as a routine in diabetic care.      Plan: RN Health Coach will contact patient in the month of April and patient agrees to next outreach.  [name] RN, BSN, [place] Direct Dial:  [phone] Fax: [phone]

## 2017-07-05 NOTE — Telephone Encounter (Signed)
Called pt about [place] results. Informed pt that [place] results were normal. Pt verbalized understanding.

## 2017-07-11 ENCOUNTER — Encounter: Payer: Self-pay | Admitting: Pharmacist

## 2017-07-11 ENCOUNTER — Ambulatory Visit (INDEPENDENT_AMBULATORY_CARE_PROVIDER_SITE_OTHER): Payer: Medicare HMO | Admitting: Pharmacist

## 2017-07-11 DIAGNOSIS — I10 Essential (primary) hypertension: Secondary | ICD-10-CM

## 2017-07-11 NOTE — Progress Notes (Signed)
Patient ID: Mallory Hayes, female   DOB: Apr 28, 1946, 72 y.o.   MRN: [id number] Reviewed: Agree with Dr. Graylin Shiver documentation and management.

## 2017-07-11 NOTE — Assessment & Plan Note (Signed)
Hypertension longstanding diagnosed currently <140/90 on current medications.  Continued lisinopril and hydrochlorothiazide at current doses. Counseled on taking home BP measurements daily and advised to contact the office if home measurements are > 145/90 mmHg. In-office BP values NOT correlating well with at-home monitor (+10 mmHg difference)

## 2017-07-11 NOTE — Progress Notes (Signed)
   S:    Patient arrives in good spirits and ambulating without assistance. Patient was last seen by Primary Care Provider on 06/25/2017. Patient wants to discuss how to use the new blood pressure monitoring device she recently purchased to measure her BP at home.   Patient reports adherence with medications.  Current BP Medications include:  hydrochlorothiazide 12.5 mg daily, lisinopril 20 mg daily  Dietary habits include: salty foods on occasion (3x a week)  O:   Last 3 Office BP readings: BP Readings from Last 3 Encounters:  07/11/17 (!) 118/53  06/25/17 130/80  06/17/17 (!) 147/78   Home Meter Evaluated in Office:   127/65  Left Arm 135/67  Right Arm   BMET    Component Value Date/Time   NA 139 07/04/2017 1145   K 3.5 07/04/2017 1145   CL 95 (L) 07/04/2017 1145   CO2 29 07/04/2017 1145   GLUCOSE 207 (H) 07/04/2017 1145   GLUCOSE 159 (H) 01/19/2016 1059   BUN 23 07/04/2017 1145   CREATININE 1.20 (H) 07/04/2017 1145   CREATININE 1.27 (H) 01/19/2016 1059   CALCIUM 10.3 07/04/2017 1145   GFRNONAA 46 (L) 07/04/2017 1145   GFRNONAA 43 (L) 01/19/2016 1059   GFRAA 53 (L) 07/04/2017 1145   GFRAA 50 (L) 01/19/2016 1059    A/P: Hypertension longstanding diagnosed currently <140/90 on current medications.  Continued lisinopril and hydrochlorothiazide at current doses. Counseled on taking home BP measurements daily and advised to contact the office if home measurements are > 145/90 mmHg. In-office BP values NOT correlating well with at-home monitor (+10 mmHg difference)  Results reviewed and written information provided.   Total time in face-to-face counseling 30 minutes.   F/U Clinic Visit with Dr. Ardelia Mems in May.    Patient seen with Hildred Alamin, PharmD Candidate and Jalene Mullet, PharmD, PGY1 Resident and Deirdre Pippins, PGY2 Pharmacy Resident, PharmD, BCPS.

## 2017-07-11 NOTE — Patient Instructions (Signed)
No changes in medication today. Great job working hard on your medications!  Your high blood pressure looks good today in clinic.  As long as you are not eating heavily salted foods everyday you should be alright with your blood pressure.  Remember to test your blood pressure every day if you remember. As long as your Blood Pressure readings are less than 145 mmHg you should be alright!  Follow up with Dr. Ardelia Mems in April 2019.

## 2017-07-12 ENCOUNTER — Ambulatory Visit: Payer: Self-pay

## 2017-08-12 ENCOUNTER — Other Ambulatory Visit: Payer: Self-pay

## 2017-08-12 NOTE — Patient Outreach (Signed)
[facility]) Care Management  08/12/2017  Kasen Sako [birth year] [id number]   1st telephone call to the patient for monthly assessment.  The patient answered the phone and did not have time to talk she stated the she will call me back.  Plan:  RN Jillyn Hidden will wait for the patient to return call.  If no response today RN Health Coach will make an outreach attempt to the patient in three to four business days.  [name] RN, BSN, [place] Direct Dial:  [phone]  Fax: [phone]

## 2017-08-16 ENCOUNTER — Encounter: Payer: Self-pay | Admitting: Family Medicine

## 2017-08-16 ENCOUNTER — Other Ambulatory Visit: Payer: Self-pay

## 2017-08-16 NOTE — Patient Outreach (Signed)
[place] [facility]) Care Management  [place]  08/16/2017   Mallory Hayes Sep 09, 1945 [id number]  Subjective: Telephone call to the patient for monthly assessment. HIPAA verified.  The patient is doing well.  She denies any pain or falls.   She is adherent with her medications. She did not check her blood sugar today and does not check it every day but states it ranges between 120-140. RN Health Coach encouraged the patient to check her blood sugars daily and the importance of checking.  The patient verbalized understanding.  The patient is walking for exercise.  She also is drinking Glucerna.  RN Health Coach will send the patient coupons for Glucerna.  The patient states that she has an appointment on 08/19/17 for recheck on her a1c and she is going to schedule an appointment for eye check.    Encounter Medications:  Outpatient Encounter Medications as of 08/16/2017  Medication Sig  . ACCU-CHEK SOFTCLIX LANCETS lancets Use to check blood sugar once daily  . Alcohol Swabs (B-D SINGLE USE SWABS REGULAR) PADS Use to check blood sugar once daily  . aspirin 81 MG tablet Take 81 mg by mouth daily.    . Blood Glucose Calibration (ACCU-CHEK AVIVA) SOLN Use as needed to calibrate glucometer  . Blood Glucose Monitoring Suppl (ACCU-CHEK AVIVA PLUS) w/Device KIT Use to check blood sugar once daily  . Blood Pressure Monitoring (BLOOD PRESSURE CUFF) MISC 1 Device by Does not apply route daily.  . Blood Pressure Monitoring (BLOOD PRESSURE MONITOR AUTOMAT) DEVI Check blood pressure 3 times per week  . glucose blood (ACCU-CHEK AVIVA PLUS) test strip Use to check blood sugar once daily  . hydrochlorothiazide (HYDRODIURIL) 12.5 MG tablet Take 1 tablet (12.5 mg total) by mouth daily.  Marland Kitchen lisinopril (PRINIVIL,ZESTRIL) 20 MG tablet Take 1 tablet (20 mg total) by mouth daily.  . metFORMIN (GLUCOPHAGE-XR) 500 MG 24 hr tablet Take 2 tablets (1,000 mg total) by mouth 2 (two) times daily. (Patient  taking differently: Take 500 mg by mouth 2 (two) times daily. )  . mometasone (NASONEX) 50 MCG/ACT nasal spray Place 2 sprays into the nose daily as needed.  . polyethylene glycol powder (GLYCOLAX/MIRALAX) powder Take 17 g by mouth daily as needed. QS for 90 day supply  . ranitidine (ZANTAC) 150 MG tablet Take 1 tablet (150 mg total) by mouth 2 (two) times daily.  . rosuvastatin (CRESTOR) 20 MG tablet TAKE 1 TABLET BY MOUTH EVERY DAY  . vitamin E 200 UNIT capsule Take 200 Units by mouth daily.     No facility-administered encounter medications on file as of 08/16/2017.     Functional Status:  No flowsheet data found.  Fall/Depression Screening: Fall Risk  08/16/2017 08/16/2017 07/05/2017  Falls in the past year? No No No  Number falls in past yr: - - -  Injury with Fall? - - -  Risk Factor Category  - - -  Risk for fall due to : - - -  Follow up - - -   PHQ 2/9 Scores 05/15/2017 04/12/2017 04/03/2017 12/20/2016 11/09/2016 06/15/2016 01/12/2016  PHQ - 2 Score 0 1 2 0 1 0 1  PHQ- 9 Score - - 5 - - - -    Assessment: Patient continues to benefit from health coach outreach for disease management and support.    THN CM Care Plan Problem One     Most Recent Value  THN Long Term Goal   Patient will decrease A1c 0.2 points within  90 days.    THN Long Term Goal Start Date  08/16/17  Interventions for Problem One Long Term Goal  Discussed with the patient about food intake and monitoring diet  THN CM Short Term Goal #1   Patient will be able to report maintaining low carbohydrate diet within 30 days.   THN CM Short Term Goal #1 Start Date  08/16/17  [facility] CM Short Term Goal #1 Met Date  08/16/17  Interventions for Short Term Goal #1  Patient states that she is monitoring her her diet  THN CM Short Term Goal #2   Patient will report checking her blood sugars daily alternating times  THN CM Short Term Goal #2 Start Date  08/16/17  Interventions for Short Term Goal #2  [facility] talked to the patient about  her blood sugars and how to get blood when checking.  Also the importance of checking daily to stay on top of her sugars.     Plan: RN Health Coach will contact patient in the month of May and patient agrees to next outreach.  [name] RN, BSN, [place] Direct Dial:  [phone]  Fax: [phone]

## 2017-08-19 ENCOUNTER — Ambulatory Visit: Payer: Medicare HMO | Admitting: Family Medicine

## 2017-09-04 ENCOUNTER — Ambulatory Visit (INDEPENDENT_AMBULATORY_CARE_PROVIDER_SITE_OTHER): Payer: Medicare HMO | Admitting: Podiatry

## 2017-09-04 ENCOUNTER — Encounter: Payer: Self-pay | Admitting: Podiatry

## 2017-09-04 DIAGNOSIS — B351 Tinea unguium: Secondary | ICD-10-CM

## 2017-09-04 DIAGNOSIS — E119 Type 2 diabetes mellitus without complications: Secondary | ICD-10-CM | POA: Diagnosis not present

## 2017-09-04 DIAGNOSIS — M79676 Pain in unspecified toe(s): Secondary | ICD-10-CM

## 2017-09-04 NOTE — Progress Notes (Signed)
Patient ID: Roy Snuffer, female   DOB: 02-24-1946, 72 y.o.   MRN: [id number] Complaint:  Visit Type: Patient returns to my office for continued preventative foot care services. Complaint: Patient states" my nails have grown long and thick and become painful to walk and wear shoes" Patient has been diagnosed with DM with no foot complications. The patient presents for preventative foot care services. No changes to ROS  Podiatric Exam: Vascular: dorsalis pedis and posterior tibial pulses are palpable bilateral. Capillary return is immediate. Temperature gradient is WNL. Skin turgor WNL  Sensorium: Normal Semmes Weinstein monofilament test. Normal tactile sensation bilaterally. Nail Exam: Pt has thick disfigured discolored nails with subungual debris noted bilateral entire nail hallux through fifth toenails Ulcer Exam: There is no evidence of ulcer or pre-ulcerative changes or infection. Orthopedic Exam: Muscle tone and strength are WNL. No limitations in general ROM. No crepitus or effusions noted. HAV  B/L and tailors bunion  B/L Skin: No Porokeratosis. No infection or ulcers  Diagnosis:  Onychomycosis, , Pain in right toe, pain in left toes  Treatment & Plan Procedures and Treatment: Consent by patient was obtained for treatment procedures. The patient understood the discussion of treatment and procedures well. All questions were answered thoroughly reviewed. Debridement of mycotic and hypertrophic toenails, 1 through 5 bilateral and clearing of subungual debris. No ulceration, no infection noted.   Return Visit-Office Procedure: Patient instructed to return to the office for a follow up visit 3 months for continued evaluation and treatment.   Gardiner Barefoot DPM

## 2017-09-20 ENCOUNTER — Encounter: Payer: Self-pay | Admitting: Family Medicine

## 2017-09-20 ENCOUNTER — Ambulatory Visit (INDEPENDENT_AMBULATORY_CARE_PROVIDER_SITE_OTHER): Payer: Medicare HMO | Admitting: Family Medicine

## 2017-09-20 ENCOUNTER — Other Ambulatory Visit: Payer: Self-pay

## 2017-09-20 VITALS — BP 110/62 | HR 60 | Temp 98.3°F | Ht 61.0 in | Wt 110.0 lb

## 2017-09-20 DIAGNOSIS — E1169 Type 2 diabetes mellitus with other specified complication: Secondary | ICD-10-CM | POA: Diagnosis not present

## 2017-09-20 DIAGNOSIS — I10 Essential (primary) hypertension: Secondary | ICD-10-CM

## 2017-09-20 LAB — POCT GLYCOSYLATED HEMOGLOBIN (HGB A1C): HBA1C, POC (CONTROLLED DIABETIC RANGE): 8.1 % — AB (ref 0.0–7.0)

## 2017-09-20 NOTE — Patient Instructions (Addendum)
Great to see you today!  Everything looks great. Keep up the good work.  Get tetanus shot Have eye doctor send [place] records  Follow up in 3 months.  Be well, Dr. Ardelia Mems

## 2017-09-20 NOTE — Patient Outreach (Signed)
[facility]) Care Management  09/20/2017  Razan Siler March 15, 1946 [id number]  I was unable to reach Mrs. Mallory Hayes today for her monthly telephone outreach for health coaching. However, I see that she has an appointment today with her primary care provider. She also recently saw her podiatrist, Dr. Gardiner Barefoot for continued preventative foot care services related to her diabetes. [name] HgA1C was checked today and found to be 8.1 (February = 8.4).   Plan: We will follow up with [name] for continued health coaching related to her DM.    [facility] [place] [facility]  [phone]

## 2017-09-20 NOTE — Progress Notes (Signed)
Date of Visit: 09/20/2017   HPI:  Patient presents for routine follow up.  Diabetes - taking metformin 1000mg  twice daily. Tolerating well. Feels well. No chest pain or shortness of breath. Does not check sugars regularly. Has been watching what she eats. Sleeping well, and walking every morning. Has upcoming eye exam scheduled.  Hypertension - taking HCTZ 125mg  daily and lisinopril 20mg  daily. Tolerating well. No dizziness or lightheadedness.  ROS: See HPI.  [place]: history of hypertension, type 2 diabetes, GERD, constipation, hyperlipidemia, OSA  PHYSICAL EXAM: BP 110/62 (BP Location: Left Arm, Patient Position: Sitting, Cuff Size: Normal)   Pulse 60   Temp 98.3 F (36.8 C) (Oral)   Ht 5\' 1"  (1.549 m)   Wt 110 lb (49.9 kg)   SpO2 99%   BMI 20.78 kg/m  Gen: no acute distress, pleasant, cooperative, well appearing HEENT: normocephalic, atraumatic, moist mucous membranes  Heart: regular rate and rhythm, no murmur Lungs: clear to auscultation bilaterally, normal work of breathing  Neuro: alert, grossly nonfocal, speech normal Ext: No appreciable lower extremity edema bilaterally   ASSESSMENT/PLAN:  Health maintenance:  -patient to schedule eye exam -reminded of need for Tdap vaccine, patient has rx already  Essential hypertension Stable, continue current regimen  Type 2 diabetes mellitus A1c 8.1, improved from prior. Congratulated patient. She will continue her current efforts with diet & exercise, along with metformin. Has upcoming eye appointment.  Follow up in 3 months.  FOLLOW UP: Follow up in 3 mos for routine medical problems  [place] J. Ardelia Mems, [place]

## 2017-09-29 NOTE — Assessment & Plan Note (Signed)
A1c 8.1, improved from prior. Congratulated patient. She will continue her current efforts with diet & exercise, along with metformin. Has upcoming eye appointment.  Follow up in 3 months.

## 2017-09-29 NOTE — Assessment & Plan Note (Signed)
Stable, continue current regimen 

## 2017-09-30 ENCOUNTER — Other Ambulatory Visit: Payer: Self-pay

## 2017-09-30 NOTE — Patient Outreach (Signed)
[place] [facility]) Care Management  [place]  09/30/2017   Mallory Hayes Mar 14, 1946 [id number]  Subjective: Successful outreach to the patient for monthly assessment. HIPAA verified. The patient is doing good.  She denies any pain or falls.  She saw her physician on 09/20/2017 and her a1c was 8.1 down from 8.4 Her blood pressure was 110/62.  I congratulated the patient on a wonderful job of taking control of her health and encouraged her to continue.  She is continuing to monitor  Her food intake.  She is walking daily and adherent with her medications.  The patient will call today to set up an appointment for her eye evaluation with Dr Carolynn Sayers.  She will follow up with her primary for another a1c check in August.   Encounter Medications:  Outpatient Encounter Medications as of 09/30/2017  Medication Sig  . ACCU-CHEK SOFTCLIX LANCETS lancets Use to check blood sugar once daily  . Alcohol Swabs (B-D SINGLE USE SWABS REGULAR) PADS Use to check blood sugar once daily  . aspirin 81 MG tablet Take 81 mg by mouth daily.    . Blood Glucose Calibration (ACCU-CHEK AVIVA) SOLN Use as needed to calibrate glucometer  . Blood Glucose Monitoring Suppl (ACCU-CHEK AVIVA PLUS) w/Device KIT Use to check blood sugar once daily  . Blood Pressure Monitoring (BLOOD PRESSURE CUFF) MISC 1 Device by Does not apply route daily.  . Blood Pressure Monitoring (BLOOD PRESSURE MONITOR AUTOMAT) DEVI Check blood pressure 3 times per week  . glucose blood (ACCU-CHEK AVIVA PLUS) test strip Use to check blood sugar once daily  . hydrochlorothiazide (HYDRODIURIL) 12.5 MG tablet Take 1 tablet (12.5 mg total) by mouth daily.  Marland Kitchen lisinopril (PRINIVIL,ZESTRIL) 20 MG tablet Take 1 tablet (20 mg total) by mouth daily.  . metFORMIN (GLUCOPHAGE-XR) 500 MG 24 hr tablet Take 2 tablets (1,000 mg total) by mouth 2 (two) times daily.  . mometasone (NASONEX) 50 MCG/ACT nasal spray Place 2 sprays into the nose daily as needed.   . polyethylene glycol powder (GLYCOLAX/MIRALAX) powder Take 17 g by mouth daily as needed. QS for 90 day supply  . ranitidine (ZANTAC) 150 MG tablet Take 1 tablet (150 mg total) by mouth 2 (two) times daily.  . rosuvastatin (CRESTOR) 20 MG tablet TAKE 1 TABLET BY MOUTH EVERY DAY  . vitamin E 200 UNIT capsule Take 200 Units by mouth daily.     No facility-administered encounter medications on file as of 09/30/2017.     Functional Status:  No flowsheet data found.  Fall/Depression Screening: Fall Risk  09/30/2017 08/16/2017 08/16/2017  Falls in the past year? No No No  Number falls in past yr: - - -  Injury with Fall? - - -  Risk Factor Category  - - -  Risk for fall due to : - - -  Follow up - - -   PHQ 2/9 Scores 09/20/2017 05/15/2017 04/12/2017 04/03/2017 12/20/2016 11/09/2016 06/15/2016  PHQ - 2 Score 0 0 1 2 0 1 0  PHQ- 9 Score - - - 5 - - -    Assessment: Patient will continue to benefit from health coach outreach for disease management and support  [facility] CM Care Plan Problem One     Most Recent Value  THN Long Term Goal   in 90 days the patient will decrease her a1c of 8.1 by 1-2 points  [facility] Long Term Goal Start Date  09/30/17  Interventions for Problem One Long Term Goal  reinterated  with the patient about food intake, exercise and medication adherence  THN CM Short Term Goal #2   Patient will report checking her blood sugars daily alternating times  THN CM Short Term Goal #2 Start Date  09/30/17  [facility] CM Short Term Goal #2 Met Date  09/30/17  Interventions for Short Term Goal #2  Patient states that she is checking her blood sugars daily     Plan: [place] will contact patient in the month of July and patient agrees to next outreach.  [name] RN, BSN, [place] Direct Dial:  [phone]  Fax: [phone]

## 2017-10-02 DIAGNOSIS — E119 Type 2 diabetes mellitus without complications: Secondary | ICD-10-CM | POA: Diagnosis not present

## 2017-10-02 DIAGNOSIS — H25813 Combined forms of age-related cataract, bilateral: Secondary | ICD-10-CM | POA: Diagnosis not present

## 2017-10-02 DIAGNOSIS — H43811 Vitreous degeneration, right eye: Secondary | ICD-10-CM | POA: Diagnosis not present

## 2017-10-02 LAB — HM DIABETES EYE EXAM

## 2017-10-30 ENCOUNTER — Other Ambulatory Visit: Payer: Self-pay

## 2017-10-30 ENCOUNTER — Ambulatory Visit (INDEPENDENT_AMBULATORY_CARE_PROVIDER_SITE_OTHER): Payer: Medicare HMO | Admitting: Family Medicine

## 2017-10-30 VITALS — BP 142/72 | HR 52 | Temp 98.0°F | Wt 107.0 lb

## 2017-10-30 DIAGNOSIS — R5383 Other fatigue: Secondary | ICD-10-CM

## 2017-10-30 DIAGNOSIS — R197 Diarrhea, unspecified: Secondary | ICD-10-CM

## 2017-10-30 NOTE — Patient Instructions (Signed)
Heat Exhaustion Information WHAT IS HEAT EXHAUSTION? Heat exhaustion happens when your body gets overheated from hot weather or from exercise. Heat exhaustion can lead to heat stroke, a life-threatening condition that requires emergency care. Heat exhaustion is more likely to develop when:  You are exercising or being active.  You are in hot or humid weather.  You are in bright sunshine.  You are not drinking enough water.  WHO IS AT RISK FOR THIS CONDITION? This condition is more likely to develop in:  People who exercise in hot or humid weather.  People who exercise beyond their fitness level.  People who wear clothing that does not allow sweat to evaporate.  People who are dehydrated.  People who drink a lot of alcoholic beverages or beverages that have caffeine. This can lead to dehydration.  People who are age 79 or older.  Children.  People who have a medical condition such as heart disease, poor circulation, sickle cell disease, or high blood pressure.  People who have a fever.  People who are very overweight (obese).  WHAT ARE THE SYMPTOMS OF THIS CONDITION? Symptoms of heat exhaustion include:  Heavy sweating along with feeling weak, dizzy, light-headed, and nauseous.  Rapid heartbeat.  Headache.  Urine that is darker than normal.  Muscle cramps, such as in the leg or side (flank).  Moist, cool, and clammy skin.  Fatigue.  Thirst.  Confusion.  Fainting.  WHAT SHOULD I DO IF I THINK I HAVE THIS CONDITION? If you think that you have heat exhaustion, call your health care provider. Follow his or her instructions. You should also:  Call a friend or a family member and ask him or her to stay with you.  Move to a cooler location, such as: ? Into the shade. ? In front of a fan. ? An air-conditioned space.  Lie down and rest.  Slowly drink nonalcoholic, caffeine-free fluids.  Take off tight clothing or extra clothing.  Take a cool bath or  shower, if possible. If you do not have access to a bath or shower, dab or mist cool water on your skin.  WHY IS IT IMPORTANT TO TREAT THIS CONDITION? It is important to take care of yourself and treat heat exhaustion as soon as possible. Untreated heat exhaustion can turn into heat stroke, which is a life-threatening condition that requires urgent medical treatment. HOW CAN I PREVENT THIS CONDITION? To prevent this condition:  Drink enough fluid to keep your urine clear or pale yellow. This helps your body to sweat properly.  Avoid outdoor activities on very hot or humid days.  Do not exercise or do other physical activity when you are not feeling well.  Take breaks often during physical activity.  Wear light-colored, loose-fitting, and lightweight clothing when it is hot outside.  Wear a hat and use sunscreen when exercising outdoors.  Avoid being outside during the hottest times of the day.  Check with your health care provider before you start any new activity, especially if you take medicine or have a medical condition.  Start any new activity slowly and work up to your fitness level.  HOW CAN I HELP TO PROTECT ELDERLY RELATIVES AND NEIGHBORS FROM THIS CONDITION? People who are age 87 or older are at greater risk for heat exhaustion. Their bodies have a harder time adjusting to heat. They are also more likely to have a medical condition or be on medicines that increase their risk for heat exhaustion. They may get heat exhaustion  indoors if the heat is high for several days. You can help to protect them during hot weather by:  Checking on them two or more times each day.  Making sure that they are drinking plenty of cool, nonalcoholic, and caffeine-free fluids.  Making sure that they use their air conditioner.  Taking them to a location where air conditioning is available.  Talking with their health care provider about their medical needs, medicines, and fluid  requirements.  SEEK MEDICAL CARE IF:  Your symptoms last longer than 30 minutes.  SEEK IMMEDIATE MEDICAL CARE IF:  You have any symptoms of heat stroke. These include: ? Fever. ? Vomiting. ? Red skin. ? Inability to sweat, resulting in hot, dry skin. ? Excessive thirst. ? Rapid breathing. ? Headache. ? Confusion or disorientation. ? Fainting. ? Seizures. These symptoms may represent a serious problem that is an emergency. Do not wait to see if the symptoms will go away. Get medical help right away. Call your local emergency services (911 in the [place] Do not drive yourself to the hospital. This information is not intended to replace advice given to you by your health care provider. Make sure you discuss any questions you have with your health care provider. Document Released: 01/24/2008 Document Revised: 11/04/2015 Document Reviewed: 08/07/2015 Elsevier Interactive Patient Education  [employer].   It was a pleasure seeing you today.   Today we discussed your fever  For your fever: I think you are likely recovering from a stomach bug and over exerting yourself in the heat. Please try and stay cool in the summer and avoid working outdoors during peak sunlight. Please increase your fluid intake and drink G2 gatorade (sugar free) to keep your electrolytes up. As you requested I will get a BMP today to look at the salts in your blood. I will either call or send a letter with these results.   Please follow up if symptoms persist or worsen. Please call the clinic immediately if you have any concerns.   Our clinic's number is [phone]. Please call with questions or concerns.   Thank you,  Caroline More, DO

## 2017-10-30 NOTE — Assessment & Plan Note (Signed)
Weakness that patient has been feeling is likely secondary to recent increase in temperatures outdoors while recovering from recent GI illness.  Patient with poor p.o. intake so likely somewhat dehydrated.  Moist mucous membranes on exam and generally well-appearing so no need for IV fluids at this time.  Given that symptoms are resolved when she is able to be in a room with air conditioning, patient is likely overexerting herself outdoors and in hot vehicle.  Encourage patient to maintain adequate hydration, suggested that she try G2 Gatorade (sugar-free) to try and replenish her electrolytes.  Encouraged patient to eat as tolerated as well.  Patient requesting lab work today to ensure electrolytes are normal, will obtain BMP.  Afebrile in clinic. -Limit time outdoors during peak sunlight hours. -Increase fluid intake and increase fluid with electrolyte -Obtain BMP -Follow-up if no improvement or symptoms worsen

## 2017-10-30 NOTE — Progress Notes (Signed)
   Subjective:    Patient ID: Mallory Hayes, female    DOB: 02/09/1946, 72 y.o.   MRN: [id number]   CC: fever  HPI: Fever Patient reports that she has been feeling unwell recently.  States she had a stomach virus 1 week ago but diarrhea has now stopped and she has not had any symptoms for 5 days.  Does state though 5 days ago she felt as though she had a fever, but did not check temperature.  Says her neck was sweating and that is how she knew she had a fever.  States that at that time she went and sat in her room with air conditioning and immediately felt better.  She also felt slightly lightheaded at that time.  States that her legs have felt slightly weak.  States she likes to go outdoors a lot but noticed that she has not been gardening as much.  Patient also spends time in a hot car frequently.  Patient also reports poor eating.  Patient with limited fluid intake.  Denies any nausea.  Was able to eat [employer] yesterday with no problems.  No known sick contacts but does go to church weekly and is unsure if anyone at church is sick.  Patient leads a very active lifestyle.   Objective:  BP (!) 142/72   Pulse (!) 52   Temp 98 F (36.7 C) (Oral)   Wt 107 lb (48.5 kg)   SpO2 99%   BMI 20.22 kg/m  Vitals and nursing note reviewed  General: well nourished, in no acute distress HEENT: normocephalic, TM's visualized bilaterally, no scleral icterus or conjunctival pallor, no nasal discharge, moist mucous membranes, good dentition without erythema or discharge noted in posterior oropharynx Neck: supple, non-tender, without lymphadenopathy Cardiac: RRR, clear S1 and S2, no murmurs, rubs, or gallops Respiratory: clear to auscultation bilaterally, no increased work of breathing Abdomen: soft, nontender, nondistended, no masses or organomegaly. Bowel sounds present Extremities: no edema or cyanosis. Warm, well perfused. 2+ radial and PT pulses bilaterally. 5/5 muscle strength in all extremities. Normal  grip strength bilaterally  Skin: warm and dry, no rashes noted Neuro: alert and oriented, no focal deficits   Assessment & Plan:    Fatigue Weakness that patient has been feeling is likely secondary to recent increase in temperatures outdoors while recovering from recent GI illness.  Patient with poor p.o. intake so likely somewhat dehydrated.  Moist mucous membranes on exam and generally well-appearing so no need for IV fluids at this time.  Given that symptoms are resolved when she is able to be in a room with air conditioning, patient is likely overexerting herself outdoors and in hot vehicle.  Encourage patient to maintain adequate hydration, suggested that she try G2 Gatorade (sugar-free) to try and replenish her electrolytes.  Encouraged patient to eat as tolerated as well.  Patient requesting lab work today to ensure electrolytes are normal, will obtain BMP.  Afebrile in clinic. -Limit time outdoors during peak sunlight hours. -Increase fluid intake and increase fluid with electrolyte -Obtain BMP -Follow-up if no improvement or symptoms worsen    Return if symptoms worsen or fail to improve.   Caroline More, DO, PGY-2

## 2017-10-31 LAB — BASIC METABOLIC PANEL
BUN/Creatinine Ratio: 19 (ref 12–28)
BUN: 29 mg/dL — AB (ref 8–27)
CALCIUM: 10.4 mg/dL — AB (ref 8.7–10.3)
CHLORIDE: 96 mmol/L (ref 96–106)
CO2: 26 mmol/L (ref 20–29)
Creatinine, Ser: 1.56 mg/dL — ABNORMAL HIGH (ref 0.57–1.00)
GFR calc Af Amer: 39 mL/min/{1.73_m2} — ABNORMAL LOW (ref >59)
GFR, EST NON AFRICAN AMERICAN: 33 mL/min/{1.73_m2} — AB (ref >59)
Glucose: 169 mg/dL — ABNORMAL HIGH (ref 65–99)
POTASSIUM: 3.1 mmol/L — AB (ref 3.5–5.2)
Sodium: 138 mmol/L (ref 134–144)

## 2017-11-04 ENCOUNTER — Other Ambulatory Visit: Payer: Self-pay

## 2017-11-04 ENCOUNTER — Encounter: Payer: Self-pay | Admitting: Family Medicine

## 2017-11-04 ENCOUNTER — Other Ambulatory Visit: Payer: Self-pay | Admitting: Licensed Clinical Social Worker

## 2017-11-04 NOTE — Patient Outreach (Signed)
[facility]) Care Management  [place]  11/04/2017   Trevia Nop 01-17-1946 [id number]  Subjective: Telephone call received from the patient for assessment. HIPAA verified. The patient states that she has not been feeling well for the past two weeks.  She was seen at her physician office on 10-30-2017 and diagnosed with dehydration.  She has increased her fluid intake by drinking water and Gatorade.  She says that she has not been eating as much due to the heat.  I advised the patient to eat small meals instead of three big meals a day.  The patient verbalized understanding.  She stated that she would like to receive meals on wheels for the next two weeks.  I told her that I would contact social work to see if that would be possible.  She states that she is adherent with her medication.  She denies any pain or falls.  She is still drinking ensure and would like to have some more coupons sent to her. The patient has not checked her blood sugar today.  She states that she talked with her physician about not checking her blood sugars daily.  I explained the reasoning for checking her blood sugars daily and the patient verbalized understanding.  She has an appointment to see her physician in August.   Encounter Medications:  Outpatient Encounter Medications as of 11/04/2017  Medication Sig  . ACCU-CHEK SOFTCLIX LANCETS lancets Use to check blood sugar once daily  . Alcohol Swabs (B-D SINGLE USE SWABS REGULAR) PADS Use to check blood sugar once daily  . hydrochlorothiazide (HYDRODIURIL) 12.5 MG tablet Take 1 tablet (12.5 mg total) by mouth daily.  Marland Kitchen lisinopril (PRINIVIL,ZESTRIL) 20 MG tablet Take 1 tablet (20 mg total) by mouth daily.  . metFORMIN (GLUCOPHAGE-XR) 500 MG 24 hr tablet Take 2 tablets (1,000 mg total) by mouth 2 (two) times daily.  . mometasone (NASONEX) 50 MCG/ACT nasal spray Place 2 sprays into the nose daily as needed.  . polyethylene glycol powder  (GLYCOLAX/MIRALAX) powder Take 17 g by mouth daily as needed. QS for 90 day supply  . ranitidine (ZANTAC) 150 MG tablet Take 1 tablet (150 mg total) by mouth 2 (two) times daily.  . rosuvastatin (CRESTOR) 20 MG tablet TAKE 1 TABLET BY MOUTH EVERY DAY  . vitamin E 200 UNIT capsule Take 200 Units by mouth daily.    Marland Kitchen aspirin 81 MG tablet Take 81 mg by mouth daily.    . Blood Glucose Calibration (ACCU-CHEK AVIVA) SOLN Use as needed to calibrate glucometer  . Blood Glucose Monitoring Suppl (ACCU-CHEK AVIVA PLUS) w/Device KIT Use to check blood sugar once daily  . Blood Pressure Monitoring (BLOOD PRESSURE CUFF) MISC 1 Device by Does not apply route daily.  . Blood Pressure Monitoring (BLOOD PRESSURE MONITOR AUTOMAT) DEVI Check blood pressure 3 times per week  . glucose blood (ACCU-CHEK AVIVA PLUS) test strip Use to check blood sugar once daily   No facility-administered encounter medications on file as of 11/04/2017.     Functional Status:  No flowsheet data found.  Fall/Depression Screening: Fall Risk  11/04/2017 10/30/2017 09/30/2017  Falls in the past year? No No No  Number falls in past yr: - - -  Injury with Fall? - - -  Risk Factor Category  - - -  Risk for fall due to : - - -  Follow up - - -   PHQ 2/9 Scores 10/30/2017 09/20/2017 05/15/2017 04/12/2017 04/03/2017 12/20/2016 11/09/2016  PHQ -  2 Score 0 0 0 1 2 0 1  PHQ- 9 Score - - - - 5 - -    Assessment: Patient will continue to benefit from health coach outreach for disease management and support.  THN CM Care Plan Problem One     Most Recent Value  [facility]N Long Term Goal   in 90 days the patient will decrease her a1c of 8.1 by 1-2 points  [facility] Long Term Goal Start Date  11/04/17  Interventions for Problem One Long Term Goal  Talked with the patient about food intake and  taking her medications   THN CM Short Term Goal #2   Patient will report checking her blood sugars daily  THN CM Short Term Goal #2 Start Date  11/04/17  Interventions for  Short Term Goal #2  Explained to the patient the improtance of checking blood sugars daily     Plan: [place] will contact patient in the month of August and patient agrees to next outreach. RN Health Coach will put in a referral to social work.  RN Health Coach will send e-mail to care management assistant to send coupons for ensure.

## 2017-11-04 NOTE — Patient Outreach (Signed)
[place] [facility]) Care Management  11/04/2017  Margean Korell 12-Jul-1945 [id number]   1st telephone call placed to the patient for assessment.  No answer.  HIPAA compliant voicemail left with contact information.  Plan: RN Health Coach will send letter. RN Health Coach will make another attempt to the patient within four business days.  [name] RN, BSN, [place] Direct Dial:  [phone]  Fax: [phone]

## 2017-11-04 NOTE — Progress Notes (Signed)
This encounter was created in error - please disregard.

## 2017-11-04 NOTE — Patient Outreach (Addendum)
[facility]) Care Management  11/04/2017  Mallory Hayes 25-Aug-1945 [id number]  [facility] CSW received new referral from [place] on 11/04/17. Request made for patient to gain Mobile Meals for two weeks as she was recently seen by physician for dehydration. Patient has not been eating as much due to being out in the heat but feels that receiving Mobile Meals will help her to get nutrition and hyrdration. [facility] CSW completed chart review and screened patient's case. [facility] CSW approves patient to get Mobile Meals through [facility] contract for two weeks. [facility] CSW forwarded referral to [place] who will follow up with this request. [facility] CSW will sign off at this time.   Eula Fried, BSW, MSW, [email or web address] Phone: [phone] Fax: [phone]

## 2017-11-05 ENCOUNTER — Other Ambulatory Visit: Payer: Self-pay

## 2017-11-05 NOTE — Patient Outreach (Signed)
[place] [facility]) Care Management  11/04/2017  Mallory Hayes 04/12/1946 [id number]   BSW received request from [place], Mallory Hayes, to submit a referral for Mobile Meals to be provided for two weeks.  Referral information was emailed to Mallory Hayes at [employer].  BSW requested follow up from Mallory Hayes.  Will contact her again tomorrow if no follow up.  Mallory Hayes, BSW Social Worker [phone]

## 2017-11-06 ENCOUNTER — Other Ambulatory Visit: Payer: Self-pay

## 2017-11-06 NOTE — Patient Outreach (Addendum)
[place] [facility]) Care Management  11/06/2017  Juanelle Trueheart Mar 30, 1946 [id number]   BSW received confirmation from Roselind Messier at [employer] [facility] that Ms. Santini will received Mobile Meals from 11/07/17 through 11/22/17.  BSW attempted to contact Ms. Sharron to inform her of this.  Left voicemail message on mobile number.  No answer or option to leave message at home number. Will attempt to reach her again tomorrow.   Addendum: BSW received return phone call from Ms. Mergen and informed her of [employer] delivery.     Ronn Melena, BSW Social Worker [phone]

## 2017-11-07 ENCOUNTER — Ambulatory Visit: Payer: Medicare HMO

## 2017-11-23 ENCOUNTER — Telehealth: Payer: Self-pay | Admitting: Family Medicine

## 2017-11-25 ENCOUNTER — Other Ambulatory Visit: Payer: Self-pay | Admitting: Family Medicine

## 2017-11-25 DIAGNOSIS — I10 Essential (primary) hypertension: Secondary | ICD-10-CM

## 2017-11-25 NOTE — Telephone Encounter (Signed)
Please ask patient to schedule follow up, she needs labs rechecked from visit with Dr. Tammi Klippel a few weeks back. Thanks [name], MD

## 2017-11-26 NOTE — Telephone Encounter (Signed)
lmovm for pt to return call. Fleeger, Jessica Dawn, CMA  

## 2017-11-27 ENCOUNTER — Telehealth: Payer: Self-pay

## 2017-11-27 NOTE — Telephone Encounter (Signed)
Called patient and scheduled appointment for repeat lab work for pt per Dr. Ardelia Mems.   Mallory Hayes, Mallory Hayes

## 2017-11-29 DIAGNOSIS — Z0100 Encounter for examination of eyes and vision without abnormal findings: Secondary | ICD-10-CM | POA: Diagnosis not present

## 2017-12-04 ENCOUNTER — Encounter: Payer: Self-pay | Admitting: Family Medicine

## 2017-12-04 ENCOUNTER — Encounter: Payer: Self-pay | Admitting: Podiatry

## 2017-12-04 ENCOUNTER — Ambulatory Visit (INDEPENDENT_AMBULATORY_CARE_PROVIDER_SITE_OTHER): Payer: Medicare HMO | Admitting: Podiatry

## 2017-12-04 DIAGNOSIS — B351 Tinea unguium: Secondary | ICD-10-CM | POA: Diagnosis not present

## 2017-12-04 DIAGNOSIS — E119 Type 2 diabetes mellitus without complications: Secondary | ICD-10-CM

## 2017-12-04 DIAGNOSIS — M79676 Pain in unspecified toe(s): Secondary | ICD-10-CM | POA: Diagnosis not present

## 2017-12-04 LAB — HM DIABETES FOOT EXAM

## 2017-12-04 NOTE — Progress Notes (Signed)
Patient ID: Mallory Hayes, female   DOB: 05/06/1945, 72 y.o.   MRN: [id number] Complaint:  Visit Type: Patient returns to my office for continued preventative foot care services. Complaint: Patient states" my nails have grown long and thick and become painful to walk and wear shoes" Patient has been diagnosed with DM with no foot complications. The patient presents for preventative foot care services. No changes to ROS  Podiatric Exam: Vascular: dorsalis pedis and posterior tibial pulses are palpable bilateral. Capillary return is immediate. Temperature gradient is WNL. Skin turgor WNL  Sensorium: Normal Semmes Weinstein monofilament test. Normal tactile sensation bilaterally. Nail Exam: Pt has thick disfigured discolored nails with subungual debris noted bilateral entire nail hallux through fifth toenails Ulcer Exam: There is no evidence of ulcer or pre-ulcerative changes or infection. Orthopedic Exam: Muscle tone and strength are WNL. No limitations in general ROM. No crepitus or effusions noted. HAV  B/L and tailors bunion  B/L Skin: No Porokeratosis. No infection or ulcers  Diagnosis:  Onychomycosis, , Pain in right toe, pain in left toes  Treatment & Plan Procedures and Treatment: Consent by patient was obtained for treatment procedures. The patient understood the discussion of treatment and procedures well. All questions were answered thoroughly reviewed. Debridement of mycotic and hypertrophic toenails, 1 through 5 bilateral and clearing of subungual debris. No ulceration, no infection noted.   Return Visit-Office Procedure: Patient instructed to return to the office for a follow up visit 3 months for continued evaluation and treatment.   Gardiner Barefoot DPM

## 2017-12-05 ENCOUNTER — Other Ambulatory Visit: Payer: Self-pay

## 2017-12-05 NOTE — Patient Outreach (Signed)
[place] [facility]) Care Management  12/05/2017  Ellakate Gonsalves 1945/11/04 [id number]    1st attempt to the patient for assessment. No answer. Phone rang five times with no voicemail pick up unable to leave a message.  Plan: RN Health Coach will send letter. RN Health Coach will make another attempt to the patient within four business days.  [name] RN, BSN, [place] Direct Dial:  [phone]  Fax: [phone]

## 2017-12-11 ENCOUNTER — Other Ambulatory Visit: Payer: Self-pay

## 2017-12-11 NOTE — Patient Outreach (Signed)
[place] [facility]) Care Management  12/11/2017  Mallory Hayes 03/31/1946 [id number]    2nd outreach attempt to the patient for assessment. No answer. After six rings no voicemail pickup. Unable to leave a message.  Plan: RN Health Coach will make another outreach attempt to the patient within the next four business days.  [name] RN, BSN, [place] Direct Dial:  [phone]  Fax: [phone]

## 2017-12-12 ENCOUNTER — Other Ambulatory Visit: Payer: Self-pay | Admitting: Family Medicine

## 2017-12-12 DIAGNOSIS — I10 Essential (primary) hypertension: Secondary | ICD-10-CM

## 2017-12-13 ENCOUNTER — Other Ambulatory Visit: Payer: Medicare HMO

## 2017-12-17 ENCOUNTER — Other Ambulatory Visit: Payer: Self-pay

## 2017-12-17 NOTE — Patient Outreach (Signed)
[place] [facility]) Care Management  12/17/2017  Mallory Hayes Jun 16, 1945 [id number]    3rd outreach attempt to the patient for assessment. No answer. HIPAA compliant voicemail left with contact information.  Plan: If no response to calls and letter in ten business days [place] will proceed with case closure.   [name] RN, BSN, [place] Direct Dial:  [phone]  Fax: [phone]

## 2017-12-18 ENCOUNTER — Telehealth: Payer: Self-pay | Admitting: *Deleted

## 2017-12-18 NOTE — Telephone Encounter (Signed)
New rx request from [place] for rosuvastatin (crestor) 20 MG tablet. Sig: take 1 tablet every day, qty: 90 w/ 3 refills. Please advise. Kiyra Slaubaugh Kennon Holter, CMA

## 2017-12-20 ENCOUNTER — Other Ambulatory Visit: Payer: Medicare HMO

## 2017-12-20 ENCOUNTER — Other Ambulatory Visit: Payer: Self-pay

## 2017-12-20 DIAGNOSIS — E871 Hypo-osmolality and hyponatremia: Secondary | ICD-10-CM

## 2017-12-20 NOTE — Patient Outreach (Signed)
[place] [facility]) Care Management  12/20/2017  Mallory Hayes [birth year] [id number]    Multiple attempts to establish contact with patient without success. No response from calls or letter mailed to patient.  Plan: Case is being closed at this time.  [name] RN, BSN, [place] Direct Dial:  [phone]  Fax: [phone]

## 2017-12-21 LAB — BASIC METABOLIC PANEL
BUN / CREAT RATIO: 15 (ref 12–28)
BUN: 28 mg/dL — AB (ref 8–27)
CHLORIDE: 94 mmol/L — AB (ref 96–106)
CO2: 24 mmol/L (ref 20–29)
CREATININE: 1.81 mg/dL — AB (ref 0.57–1.00)
Calcium: 10.3 mg/dL (ref 8.7–10.3)
GFR calc non Af Amer: 28 mL/min/{1.73_m2} — ABNORMAL LOW (ref >59)
GFR, EST AFRICAN AMERICAN: 32 mL/min/{1.73_m2} — AB (ref >59)
GLUCOSE: 125 mg/dL — AB (ref 65–99)
Potassium: 3.2 mmol/L — ABNORMAL LOW (ref 3.5–5.2)
SODIUM: 136 mmol/L (ref 134–144)

## 2017-12-24 MED ORDER — ROSUVASTATIN CALCIUM 20 MG PO TABS: 20.0000 mg | ORAL_TABLET | Freq: Every day | ORAL | 3 refills | Status: DC

## 2018-01-02 ENCOUNTER — Other Ambulatory Visit: Payer: Self-pay

## 2018-01-02 NOTE — Patient Outreach (Signed)
[place] [facility]) Care Management  01/02/2018  Madisyn Mawhinney 09-01-1945 [id number]    1st telephone outreach to the patient for initial assessment. No answer and unable to leave voicemail message.  Plan: RN Health Coach will make another outreach attempt to the patient within thirty days.   [name] RN, BSN, [place] Direct Dial:  [phone]  Fax: [phone]

## 2018-01-03 ENCOUNTER — Telehealth: Payer: Self-pay | Admitting: Family Medicine

## 2018-01-03 NOTE — Telephone Encounter (Signed)
Patient had Bmet done in August that she needs to come in for an appointment in order to discuss  Please call her and ask her to come in to see either me on T or Wed next week or another provider on Monday   Thanks [place]

## 2018-01-03 NOTE — Telephone Encounter (Signed)
LMOVM for pt to call back and make an appt to discuss lab results. Gaspar Fowle Kennon Holter, CMA

## 2018-01-06 ENCOUNTER — Other Ambulatory Visit: Payer: Self-pay

## 2018-01-06 NOTE — Patient Outreach (Signed)
[place] [facility]) Care Management  01/06/2018   Mallory Hayes 02/08/1946 [id number]       Outreach attempt # 2. HIPAA verified with patient.  Discussed and offered [facility] care management services with patient. Patient verbally agreed to services.      Social: The patient lives alone in the home.  She is able to perform her ADLS/IADLS independently.  She has transportation to her appointments. Durable medical equipment in the home is Accu check glucometer, blood pressure cuff and eyeglasses.  Conditions: Per chart and conversation with the patient her conditions include HTN, OSA, Esophageal reflux, Type II diabetes and Hypercholesterolemia. The patient denies any pain or falls.  She was seen by her primary care who monitors her diabetes Dr. Ardelia Mems in June.  She is seen every three months.  Her A!C was 8.1.  The patient has not checked her blood sugar today and states that she forgets to check her blood sugars. I encouraged the patient to check her blood sugars on a daily basis.  The patient states that she trying to follow a diabetic, low salt diet. She reports that she walks daily in the mornings.    Medications: The patient is on nine medications per chart review.  She is able to manage her medications and has not expressed any problems with paying for her medications.  Appointments: The patient states that she has an appointment with Dr. Ardelia Mems in October to follow on her diabetes.  Advanced Directives: The patient does not have an advanced directive but has asked that information be mailed to her.    Current Medications:  Current Outpatient Medications  Medication Sig Dispense Refill  . ACCU-CHEK SOFTCLIX LANCETS lancets Use to check blood sugar once daily 100 each 12  . Alcohol Swabs (B-D SINGLE USE SWABS REGULAR) PADS Use to check blood sugar once daily 100 each 3  . aspirin 81 MG tablet Take 81 mg by mouth daily.      . Blood Glucose Calibration (ACCU-CHEK AVIVA)  SOLN Use as needed to calibrate glucometer 1 each 0  . Blood Pressure Monitoring (BLOOD PRESSURE CUFF) MISC 1 Device by Does not apply route daily. 1 each 0  . hydrochlorothiazide (HYDRODIURIL) 12.5 MG tablet TAKE 1 TABLET (12.5 MG TOTAL) BY MOUTH DAILY. 90 tablet 0  . lisinopril (PRINIVIL,ZESTRIL) 20 MG tablet TAKE 1 TABLET EVERY DAY 90 tablet 3  . metFORMIN (GLUCOPHAGE-XR) 500 MG 24 hr tablet Take 2 tablets (1,000 mg total) by mouth 2 (two) times daily. 60 tablet 1  . mometasone (NASONEX) 50 MCG/ACT nasal spray Place 2 sprays into the nose daily as needed.    . polyethylene glycol powder (GLYCOLAX/MIRALAX) powder Take 17 g by mouth daily as needed. QS for 90 day supply 255 g 2  . ranitidine (ZANTAC) 150 MG tablet Take 1 tablet (150 mg total) by mouth 2 (two) times daily. 60 tablet 5  . rosuvastatin (CRESTOR) 20 MG tablet Take 1 tablet (20 mg total) by mouth daily. 90 tablet 3  . vitamin E 200 UNIT capsule Take 200 Units by mouth daily.      . Blood Glucose Monitoring Suppl (ACCU-CHEK AVIVA PLUS) w/Device KIT Use to check blood sugar once daily 1 kit 0  . Blood Pressure Monitoring (BLOOD PRESSURE MONITOR AUTOMAT) DEVI Check blood pressure 3 times per week 1 Device 0  . glucose blood (ACCU-CHEK AVIVA PLUS) test strip Use to check blood sugar once daily 100 each 12   No current facility-administered medications for  this visit.     Functional Status:  No flowsheet data found.  Fall/Depression Screening: Fall Risk  01/06/2018 11/04/2017 10/30/2017  Falls in the past year? No No No  Number falls in past yr: - - -  Injury with Fall? - - -  Risk Factor Category  - - -  Risk for fall due to : - - -  Follow up - - -   PHQ 2/9 Scores 10/30/2017 09/20/2017 05/15/2017 04/12/2017 04/03/2017 12/20/2016 11/09/2016  PHQ - 2 Score 0 0 0 1 2 0 1  PHQ- 9 Score - - - - 5 - -    Assessment: Patient will benefit from health coach outreach for disease management and support.   [facility] CM Care Plan Problem One     Most  Recent Value  Care Plan Problem One  Knowledge deficit related to diease management of diabetes  Role Documenting the Problem One  Health Coach  Care Plan for Problem One  Active  [facility]N Long Term Goal   in 90 days the patient will decrease her a1c of 8.1 by 1-2 points  [facility] Long Term Goal Start Date  01/06/18  Interventions for Problem One Long Term Goal  discussed cheking blood sugars daily, diet and exercise     Plan: RN Health Coach will contact patient in the month of December and patient agrees to next outreach.  [name] RN, BSN, [place] Direct Dial:  [phone]  Fax: [phone]

## 2018-01-09 ENCOUNTER — Encounter: Payer: Self-pay | Admitting: Family Medicine

## 2018-01-09 ENCOUNTER — Other Ambulatory Visit: Payer: Self-pay

## 2018-01-09 ENCOUNTER — Ambulatory Visit (INDEPENDENT_AMBULATORY_CARE_PROVIDER_SITE_OTHER): Payer: Medicare HMO | Admitting: Student in an Organized Health Care Education/Training Program

## 2018-01-09 VITALS — BP 136/68 | HR 52 | Temp 98.2°F | Ht 61.0 in | Wt 104.0 lb

## 2018-01-09 DIAGNOSIS — N179 Acute kidney failure, unspecified: Secondary | ICD-10-CM | POA: Diagnosis not present

## 2018-01-09 DIAGNOSIS — E1169 Type 2 diabetes mellitus with other specified complication: Secondary | ICD-10-CM | POA: Diagnosis not present

## 2018-01-09 LAB — POCT GLYCOSYLATED HEMOGLOBIN (HGB A1C): HBA1C, POC (CONTROLLED DIABETIC RANGE): 8.4 % — AB (ref 0.0–7.0)

## 2018-01-09 MED ORDER — SITAGLIPTIN PHOSPHATE 25 MG PO TABS
25.0000 mg | ORAL_TABLET | Freq: Every day | ORAL | 0 refills | Status: DC
Start: 2018-01-09 — End: 2018-02-13

## 2018-01-09 NOTE — Progress Notes (Signed)
   CC: Follow up lab results  HPI: Mallory Hayes is a 72 y.o. female who was previously noted to have an elevated creatinine on her recent testing and so was asked to come in for a follow up visit today for further management.  Patient was seen for a visit for diarrhea on 7/3 and at that time labwork was drawn to check for an AKI. Patient was noted to have creatinine elevated at 1.56 and GFR of 39. She was sent for follow up blood work which demonstrated worsened creatinine of 1.81 and GFR of 32.   She reports that she makes urine, stays hydrated, and is asymptomatic. BUN is 28. She is not having any tremors or confusion. She does have a history of diabetes and hypertension.  Review of Symptoms:  See HPI for ROS.   CC, SH/smoking status, and VS noted.  Objective: BP 136/68   Pulse (!) 52   Temp 98.2 F (36.8 C) (Oral)   Ht 5\' 1"  (1.549 m)   Wt 104 lb (47.2 kg)   SpO2 99%   BMI 19.65 kg/m  GEN: NAD, alert, cooperative, and pleasant RESPIRATORY: comfortable work of breathing, speaks in full sentences CV: RRR, no m/r/g, no peripheral edema GI: soft, non-tender SKIN: warm and dry, no rashes or lesions NEURO: II-XII grossly intact PSYCH: AAOx3, appropriate affect  Assessment and plan:  1. Type 2 diabetes mellitus with other specified complication, without long-term current use of insulin (HCC) Due to worsening renal function, we have to make some changes to her diabetic regimen. We will discontinue metformin and start Januvia.  - sitaGLIPtin (JANUVIA) 25 MG tablet; Take 1 tablet (25 mg total) by mouth daily.  Dispense: 30 tablet; Refill: 0 - POCT glycosylated hemoglobin (Hb A1C)  2. Renal injury/new diagnosis of CKD IV  - Gradually worsening renal function over last 2  BMP checks. GFR 28 on last check, Cr elevated 1.81. Medication list reviewed without any obvious insults. Uncertain etiology of worsening renal function. Will send to nephrology. - US Renal; Future - PTH, Intact and  Calcium - VITAMIN D 25 Hydroxy (Vit-D Deficiency, Fractures) - Phosphorus  3. HTN Controlled today. Due to GFR <30, we will stop HCTZ (no longer effective at this GFR). Patient advised to schedule follow up one week from today for BP check. Would consider starting amlodipine 5 mg if BP is elevated at follow up visit.  Everrett Coombe, MD,MS,  PGY3 01/09/2018 5:08 PM

## 2018-01-09 NOTE — Patient Instructions (Addendum)
It was a pleasure seeing you today in our clinic. Today we discussed your worsening kidney function. Here is the treatment plan we have discussed and agreed upon together:  We made changes to your medicines to be safe for your kidneys.  STOP taking your metformin  STOP taking your HCTZ  START taking your new diabetes medicine called JANUVIA (sitagliptin)  We drew blood work at today's visit. I will call or send you a letter with these results. If you do not hear from me within the next week, please give our office a call.  A consult was placed to the kidney doctors at today's visit.  You will receive a call to schedule an appointment. If you do not receive a call within two weeks please call our office so we can place the consult again.  Our clinic's number is [phone]. Please call with questions or concerns about what we discussed today.  Be well, Dr. Burr Medico

## 2018-01-10 ENCOUNTER — Ambulatory Visit
Admission: RE | Admit: 2018-01-10 | Discharge: 2018-01-10 | Disposition: A | Discharge status: Home / Self Care | Payer: Medicare HMO | Source: Ambulatory Visit | Attending: Family Medicine | Admitting: Family Medicine

## 2018-01-10 DIAGNOSIS — N179 Acute kidney failure, unspecified: Secondary | ICD-10-CM

## 2018-01-10 LAB — PTH, INTACT AND CALCIUM
CALCIUM: 10.3 mg/dL (ref 8.7–10.3)
PTH: 32 pg/mL (ref 15–65)

## 2018-01-10 LAB — VITAMIN D 25 HYDROXY (VIT D DEFICIENCY, FRACTURES): Vit D, 25-Hydroxy: 30.9 ng/mL (ref 30.0–100.0)

## 2018-01-10 LAB — PHOSPHORUS: Phosphorus: 2.5 mg/dL (ref 2.5–4.5)

## 2018-01-21 ENCOUNTER — Ambulatory Visit: Payer: Medicare HMO | Admitting: Family Medicine

## 2018-01-24 ENCOUNTER — Encounter: Payer: Self-pay | Admitting: Family Medicine

## 2018-01-24 ENCOUNTER — Ambulatory Visit (INDEPENDENT_AMBULATORY_CARE_PROVIDER_SITE_OTHER): Payer: Medicare HMO | Admitting: Family Medicine

## 2018-01-24 ENCOUNTER — Ambulatory Visit: Payer: Self-pay

## 2018-01-24 VITALS — BP 100/60 | HR 49 | Temp 98.3°F | Wt 114.0 lb

## 2018-01-24 DIAGNOSIS — Z596 Low income: Secondary | ICD-10-CM | POA: Diagnosis not present

## 2018-01-24 DIAGNOSIS — M76829 Posterior tibial tendinitis, unspecified leg: Secondary | ICD-10-CM | POA: Diagnosis not present

## 2018-01-24 DIAGNOSIS — K5909 Other constipation: Secondary | ICD-10-CM

## 2018-01-24 MED ORDER — POLYETHYLENE GLYCOL 3350 17 GM/SCOOP PO POWD: 17.0000 g | Freq: Every day | ORAL | 2 refills | Status: DC | PRN

## 2018-01-24 NOTE — Progress Notes (Addendum)
Subjective:    Mallory Hayes - 72 y.o. female MRN [id number]  Date of birth: 08/17/1945  HPI  Mallory Hayes is here for left ankle pain.  She would like to be enrolled in the MAPP program through the health department to help her afford her Januvia.  Ankle pain - July 10, quart of milk was dropped on your lateral side of L ankle - reduced activity and took Tylenol afterward, and it improved in three weeks - never had bruising, swelling, or limp at that time - noticed new pain on medial side of L ankle -No new trauma noted - tender when applying weight and when inside of shoe rubs against it - has had to walk with ankle outside of shoe - tylenol and rest temporarily help - enjoys walking, which is more difficult now.  She now has to drag her left foot when walking.  Medication affordability -Patient says that her Celesta Gentile is about $45 per month and is interested in becoming enrolled in the health department's MAPP program if possible    Health Maintenance:  -Would like to defer flu shot for now.  Plans to make a nurse visit for this. Health Maintenance Due  Topic Date Due  . TETANUS/TDAP  03/01/2015  . INFLUENZA VACCINE  11/28/2017    -  reports that she has never smoked. She has never used smokeless tobacco. - Review of Systems: Per HPI. - Past Medical History: Patient Active Problem List   Diagnosis Date Noted  . Posterior tibial tendon dysfunction 01/24/2018  . Patient cannot afford medications 01/24/2018  . Postmenopausal bleeding 05/17/2017  . Healthcare maintenance 03/29/2017  . OSA (obstructive sleep apnea) 11/15/2015  . Murmur, heart 05/29/2015  . Family history of ovarian cancer 01/04/2014  . Sleep concern 09/21/2013  . Postmenopausal atrophic vaginitis 08/01/2012  . Bradycardia 02/01/2012  . Strain of right trapezius muscle 01/21/2012  . Fatigue 01/21/2012  . Hypokalemia 01/02/2012  . Hot flashes 01/02/2012  . Metatarsalgia of right foot 08/07/2011  . Eye  muscle weakness 01/11/2011  . Esophageal reflux 09/01/2007  . PERSONAL HISTORY OF COLONIC POLYPS 09/01/2007  . HYPERCHOLESTEROLEMIA 03/06/2007  . Allergic rhinitis 12/27/2006  . CONSTIPATION, CHRONIC 12/27/2006  . MENOPAUSAL SYNDROME 12/27/2006  . SYMPTOM, INCONTINENCE, MIXED, URGE/STRESS 12/27/2006  . Type 2 diabetes mellitus ([place]) 11/15/2006  . WEIGHT LOSS, ABNORMAL 10/22/2006  . DEPRESSION, MAJOR, RECURRENT 06/27/2006  . Anxiety state 06/27/2006  . Essential hypertension 06/27/2006   - Medications: reviewed and updated   Objective:   Physical Exam BP 100/60   Pulse (!) 49   Temp 98.3 F (36.8 C) (Oral)   Wt 114 lb (51.7 kg)   SpO2 100%   BMI 21.54 kg/m  Gen: NAD, alert, cooperative with exam, well-appearing Musculoskeletal: Slight swelling noted in the medial aspect of left ankle, around navicular bone.  Point tenderness around medial aspect of navicular bone.  Walks with significant limp.  Inversion noted with patient standing, L>R.  No decrease in internal rotation of left ankle compared to right when patient is standing on toes.  Extra toe test is negative. Psych: good insight, alert and oriented        Assessment & Plan:   Posterior tibial tendon dysfunction This is the most likely diagnosis given patient's inversion given patient's pronation of feet and location of pain.  Fracture unlikely given lack of trauma.  Likely not related to previous injury in July.  Also on the differential is a stress fracture given her  point tenderness over the navicular bone, and this would be best visualized on ultrasound.  Gave patient an arch support bandage today, which patient said was helpful.  Instructed patient to use Tylenol and rest to alleviate pain as well as ice alternating with heat she finds is helpful.  Sent referral to sports medicine, since they can ultrasound her foot there and prescribe brace or boot if needed.  CONSTIPATION, CHRONIC Refilled patient's MiraLAX per  patient request.  Patient cannot afford medications Will send message to our social worker to see if we can enroll patient in the MAPP program or a similar patient assistance program so that she can afford her Januvia.    Maia Breslow, M.D. 01/24/2018, 3:57 PM PGY-2, [place]

## 2018-01-24 NOTE — Assessment & Plan Note (Signed)
This is the most likely diagnosis given patient's inversion given patient's pronation of feet and location of pain.  Fracture unlikely given lack of trauma.  Likely not related to previous injury in July.  Also on the differential is a stress fracture given her point tenderness over the navicular bone, and this would be best visualized on ultrasound.  Gave patient an arch support bandage today, which patient said was helpful.  Instructed patient to use Tylenol and rest to alleviate pain as well as ice alternating with heat she finds is helpful.  Sent referral to sports medicine, since they can ultrasound her foot there and prescribe brace or boot if needed.

## 2018-01-24 NOTE — Assessment & Plan Note (Signed)
Refilled patient's MiraLAX per patient request.

## 2018-01-24 NOTE — Patient Instructions (Addendum)
It was nice meeting you today [name]!  I am referring you to sports medicine for further evaluation of your ankle.  In the meantime, please use the arch support bandage, limit walking, and use Tylenol and ice for pain relief.  I have refilled your Miralax, and I will send a message to our social worker to see how we can get you enrolled in the [facility] program.  If you have any questions or concerns, please feel free to call the clinic.   Be well,  Dr. Shan Levans

## 2018-01-24 NOTE — Assessment & Plan Note (Signed)
Will send message to our social worker to see if we can enroll patient in the MAPP program or a similar patient assistance program so that she can afford her Januvia.

## 2018-01-29 ENCOUNTER — Ambulatory Visit (HOSPITAL_COMMUNITY)
Admission: RE | Admit: 2018-01-29 | Discharge: 2018-01-29 | Disposition: A | Discharge status: Home / Self Care | Payer: Medicare HMO | Source: Ambulatory Visit | Attending: Family Medicine | Admitting: Family Medicine

## 2018-01-29 ENCOUNTER — Encounter: Payer: Self-pay | Admitting: Family Medicine

## 2018-01-29 ENCOUNTER — Ambulatory Visit (INDEPENDENT_AMBULATORY_CARE_PROVIDER_SITE_OTHER): Payer: Medicare HMO | Admitting: Family Medicine

## 2018-01-29 VITALS — BP 210/90 | Ht 61.0 in | Wt 116.6 lb

## 2018-01-29 DIAGNOSIS — M7989 Other specified soft tissue disorders: Secondary | ICD-10-CM | POA: Diagnosis not present

## 2018-01-29 DIAGNOSIS — M79672 Pain in left foot: Secondary | ICD-10-CM | POA: Insufficient documentation

## 2018-01-29 DIAGNOSIS — M79671 Pain in right foot: Secondary | ICD-10-CM | POA: Diagnosis not present

## 2018-01-29 NOTE — Progress Notes (Signed)
Bilateral lower extremity venous duplex has been completed. Negative for DVT. Results were given to [facility] at Dr. Ericka Pontiff office.  01/29/18 3:11 PM Carlos Levering RVT

## 2018-01-29 NOTE — Progress Notes (Signed)
PCP: [name], MD Consultation requested by: Dr. Maia Breslow  Subjective:   HPI: Patient is a 72 y.o. female here for foot pain, swelling.  Patient reports she typically walks daily. About 2-3 weeks ago she started to get pain and swelling medial aspect of left ankle. Pain bothered even to light touch though pain has improved some. Now noticed swelling into right foot and ankle also. No shortness of breath, weakness but states she 'doesn't feel as good as usual' She recently was taken off HCTZ as blood pressure was good in PCP's office but is very elevated today, 200/90 on my repeat measurement. No current pain with walking though has had this. Tried rest, tylenol. No skin changes, numbness.  Past Medical History:  Diagnosis Date  . GERD (gastroesophageal reflux disease)   . OSA (obstructive sleep apnea)   . Sleep apnea     Current Outpatient Medications on File Prior to Visit  Medication Sig Dispense Refill  . ACCU-CHEK SOFTCLIX LANCETS lancets Use to check blood sugar once daily 100 each 12  . Alcohol Swabs (B-D SINGLE USE SWABS REGULAR) PADS Use to check blood sugar once daily 100 each 3  . aspirin 81 MG tablet Take 81 mg by mouth daily.      . Blood Glucose Calibration (ACCU-CHEK AVIVA) SOLN Use as needed to calibrate glucometer 1 each 0  . Blood Glucose Monitoring Suppl (ACCU-CHEK AVIVA PLUS) w/Device KIT Use to check blood sugar once daily 1 kit 0  . Blood Pressure Monitoring (BLOOD PRESSURE CUFF) MISC 1 Device by Does not apply route daily. 1 each 0  . Blood Pressure Monitoring (BLOOD PRESSURE MONITOR AUTOMAT) DEVI Check blood pressure 3 times per week 1 Device 0  . glucose blood (ACCU-CHEK AVIVA PLUS) test strip Use to check blood sugar once daily 100 each 12  . lisinopril (PRINIVIL,ZESTRIL) 20 MG tablet TAKE 1 TABLET EVERY DAY 90 tablet 3  . mometasone (NASONEX) 50 MCG/ACT nasal spray Place 2 sprays into the nose daily as needed.    . polyethylene glycol  powder (GLYCOLAX/MIRALAX) powder Take 17 g by mouth daily as needed. QS for 90 day supply 255 g 2  . ranitidine (ZANTAC) 150 MG tablet Take 1 tablet (150 mg total) by mouth 2 (two) times daily. 60 tablet 5  . rosuvastatin (CRESTOR) 20 MG tablet Take 1 tablet (20 mg total) by mouth daily. 90 tablet 3  . sitaGLIPtin (JANUVIA) 25 MG tablet Take 1 tablet (25 mg total) by mouth daily. 30 tablet 0  . vitamin E 200 UNIT capsule Take 200 Units by mouth daily.       No current facility-administered medications on file prior to visit.     Past Surgical History:  Procedure Laterality Date  . APPENDECTOMY    . BREAST EXCISIONAL BIOPSY Right    benign  . CHOLECYSTECTOMY    . COLONOSCOPY  2011  . PARTIAL HYSTERECTOMY  1991    Allergies  Allergen Reactions  . Amaryl [Glimepiride] Anxiety  . Food Itching    Spices, peppers  . Sulfamethoxazole Itching, Rash and Other (See Comments)    sneezing    Social History   Socioeconomic History  . Marital status: Single    Spouse name: Not on file  . Number of children: Not on file  . Years of education: Not on file  . Highest education level: Not on file  Occupational History  . Not on file  Social Needs  . Financial resource strain: Not  on file  . Food insecurity:    Worry: Not on file    Inability: Not on file  . Transportation needs:    Medical: Not on file    Non-medical: Not on file  Tobacco Use  . Smoking status: Never Smoker  . Smokeless tobacco: Never Used  Substance and Sexual Activity  . Alcohol use: No  . Drug use: No  . Sexual activity: Yes    Birth control/protection: None  Lifestyle  . Physical activity:    Days per week: Not on file    Minutes per session: Not on file  . Stress: Not on file  Relationships  . Social connections:    Talks on phone: Not on file    Gets together: Not on file    Attends religious service: Not on file    Active member of club or organization: Not on file    Attends meetings of clubs or  organizations: Not on file    Relationship status: Not on file  . Intimate partner violence:    Fear of current or ex partner: Not on file    Emotionally abused: Not on file    Physically abused: Not on file    Forced sexual activity: Not on file  Other Topics Concern  . Not on file  Social History Narrative   Pt's 102 yo mother died 08/15/2010 of breast cancer. Pt was very close with mother.  Has 2 siblings that live out of town, but has some cousins and friends in the [place] area.   Retired, never married   No h/o tob, EToH, or drug use   Enjoys walking, reading, dining out, and bookstore browsing    Family History  Problem Relation Age of Onset  . Cancer Mother   . Hypertension Mother   . Diabetes Mother 35  . Colon cancer Mother 43  . Ovarian cancer Mother   . Hyperlipidemia Mother   . Cancer Father   . Prostatitis Brother   . Diabetes Brother     BP (!) 210/90   Ht _0  (1.549 m)   Wt 116 lb 9.6 oz (52.9 kg)   BMI 22.03 kg/m   Review of Systems: See HPI above.     Objective:  Physical Exam:  Gen: NAD, comfortable in exam room  CV: RRR, no MRG. Lungs: CTAB, no wheezes, rales, rhonchi.  Left foot/lower leg: Pes planus.  2+ edema of foot and ankle, 1+ from ankle to prox tibia.  No gross deformity, swelling, ecchymoses. FROM with 5/5 strength all directions. No TTP currently including calf. Negative ant drawer and talar tilt.   Negative syndesmotic compression. Thompsons test negative. NV intact distally.  Right foot/lower leg: Pes planus.  2+ edema of foot and ankle, 1+ from ankle to prox tibia.  No gross deformity, swelling, ecchymoses. FROM with 5/5 strength all directions. No TTP currently including calf. Negative ant drawer and talar tilt.   Negative syndesmotic compression. Thompsons test negative. NV intact distally.  MSK u/s bilateral ankles/lower legs:  Venous structures compressible.  Soft tissue swelling.  Medial ankle tendons normal without  tenosynovitis, partial tearing including post tib tendons.   Assessment & Plan:  1.  Bilateral leg swelling: Patient's exam and ultrasound are currently reassuring despite noted edema to about mid to proximal tibias.  She reports this is new for her but she did just discontinue hydrochlorothiazide.  Her blood pressure is markedly elevated especially systolics today on manual rechecks.  I advised that  she restart her hydrochlorothiazide with plan to recheck in the family practice office tomorrow.  She was set up for bilateral Doppler ultrasounds of her lower extremities as well which have come back negative for DVT.  If she continues to struggle with pain that limits her walking after getting over these acute issues would consider arch support, Thera-Band strengthening exercises versus physical therapy of her lower extremities.  She can follow with [place] in 1 month or as needed.  2. Elevated blood pressure: No evidence of endorgan damage to warrant concern for hypertensive emergency.  She will restart hydrochlorthiazide with recheck in the family practice clinic tomorrow.

## 2018-01-29 NOTE — Patient Instructions (Addendum)
Get the ultrasound of your legs today to rule out a blood clot. Restart your hydrochlorothiazide today, take it tomorrow also. Follow up with family practice tomorrow to recheck your blood pressure and reexamine you.   You will be going to the [facility] today at 245p for a 3p study to evaluate your legs for a blood clot. Register in the [facility] at 245p [phone])  You will be seeing Dr Martinique Shirley tomorrow at the [facility] at 330pm to have her evaluate you and check your blood pressure

## 2018-01-30 ENCOUNTER — Ambulatory Visit (INDEPENDENT_AMBULATORY_CARE_PROVIDER_SITE_OTHER): Payer: Medicare HMO | Admitting: Family Medicine

## 2018-01-30 ENCOUNTER — Other Ambulatory Visit: Payer: Self-pay

## 2018-01-30 ENCOUNTER — Encounter: Payer: Self-pay | Admitting: Family Medicine

## 2018-01-30 VITALS — BP 190/88 | HR 46 | Temp 98.4°F | Wt 113.4 lb

## 2018-01-30 DIAGNOSIS — I10 Essential (primary) hypertension: Secondary | ICD-10-CM

## 2018-01-30 DIAGNOSIS — M7989 Other specified soft tissue disorders: Secondary | ICD-10-CM | POA: Insufficient documentation

## 2018-01-30 MED ORDER — FUROSEMIDE 20 MG PO TABS: 20.0000 mg | ORAL_TABLET | Freq: Every day | ORAL | 3 refills | Status: DC

## 2018-01-30 NOTE — Assessment & Plan Note (Addendum)
Patient recently had HCTZ discontinued due to worsening renal function and normal BPs.  However patient with markedly increased BP at sports medicine clinic as well as today in office with BP of 190/88.  Patient denies any headache or focal neurologic symptoms.  Patient also presenting with weight of 113 lb with recent weight at visit on 9/12 of 104 lbs.   After discussion with preceptor will start patient on Lasix 20 mg daily in order to help with BP as well as lower extremity swelling.  Patient instructed to remain off of HCTZ.  Patient to return in 1 week for blood pressure check and check of her lower extremity swelling.  At that time if patient remains hypertensive would consider restarting a calcium channel blocker at that time.   Patient given return precautions including headache, vision changes, chest pain, shortness of breath, or worsening swelling.  We will also obtain repeat BMP as it is been 1 month since worsening renal function discovered in office.

## 2018-01-30 NOTE — Progress Notes (Addendum)
Subjective:    Patient ID: Mallory Hayes, female    DOB: [birth year], 72 y.o.   MRN: [id number]   CC: Blood pressure follow-up and leg swelling  HPI: Hypertension: - Medications: Patient recently had HCTZ discontinued on 9/12 given worsening renal function.  Patient referred to nephrology at that time.  Has not heard back from them yet.  Yesterday at sports medicine clinic patient with BP of 210/90 and was instructed to go home and take her HCTZ and to take it today as well.  Patient also on lisinopril 20 mg.  Previously has been on amlodipine but stopped due to improvement in blood pressure and patient feeling weak on this medication. - Compliance: Patient compliant with her lisinopril and HCTZ from yesterday and today - Denies any SOB, CP, vision changes - Diet: Patient reports that she tries to have a low-sodium diet but does not always succeed at this - Exercise: Patient reports that she walks daily and also exercises in her garden regularly  Bilateral foot swelling: -Patient concerned because since last Sunday she has had worsening swelling in her feet up to her ankle.  She is now experiencing pain in her ankle.  Both feet have been swelling.  She has never had anything like this before.   - Patient with echo in 2017 which showed EF of 55 to 60% as well as G1 DD.  Patient reports that she had this done due to a murmur. -Patient has never required a diuretic other than HCTZ. -Patient denies any orthopnea.  Smoking status reviewed  ROS: 10 point ROS is otherwise negative, except as mentioned in HPI  Patient Active Problem List   Diagnosis Date Noted  . Leg swelling 01/30/2018  . Posterior tibial tendon dysfunction 01/24/2018  . Patient cannot afford medications 01/24/2018  . Postmenopausal bleeding 05/17/2017  . Healthcare maintenance 03/29/2017  . OSA (obstructive sleep apnea) 11/15/2015  . Murmur, heart 05/29/2015  . Family history of ovarian cancer 01/04/2014  . Sleep concern  09/21/2013  . Postmenopausal atrophic vaginitis 08/01/2012  . Bradycardia 02/01/2012  . Strain of right trapezius muscle 01/21/2012  . Fatigue 01/21/2012  . Hypokalemia 01/02/2012  . Hot flashes 01/02/2012  . Metatarsalgia of right foot 08/07/2011  . Eye muscle weakness 01/11/2011  . Esophageal reflux 09/01/2007  . PERSONAL HISTORY OF COLONIC POLYPS 09/01/2007  . HYPERCHOLESTEROLEMIA 03/06/2007  . Allergic rhinitis 12/27/2006  . CONSTIPATION, CHRONIC 12/27/2006  . MENOPAUSAL SYNDROME 12/27/2006  . SYMPTOM, INCONTINENCE, MIXED, URGE/STRESS 12/27/2006  . Type 2 diabetes mellitus ([place]) 11/15/2006  . WEIGHT LOSS, ABNORMAL 10/22/2006  . DEPRESSION, MAJOR, RECURRENT 06/27/2006  . Anxiety state 06/27/2006  . Essential hypertension 06/27/2006     Objective:  BP (!) 190/88   Pulse (!) 46   Temp 98.4 F (36.9 C) (Oral)   Wt 113 lb 6.4 oz (51.4 kg)   SpO2 98%   BMI 21.43 kg/m  Vitals and nursing note reviewed  General: NAD, pleasant Cardiac: RRR, normal heart sounds Respiratory: CTAB, normal effort Extremities: 2+ pitting edema in BL LE to ankles, no cyanosis. WWP. Skin: warm and dry, no rashes noted Neuro: alert and oriented, no focal deficits Psych: normal affect  Assessment & Plan:    Essential hypertension Patient recently had HCTZ discontinued due to worsening renal function and normal BPs.  However patient with markedly increased BP at sports medicine clinic as well as today in office with BP of 190/88.  Patient denies any headache or focal neurologic symptoms.  Patient also  presenting with weight of 113 lb with recent weight at visit on 9/12 of 104 lbs.   After discussion with preceptor will start patient on Lasix 20 mg daily in order to help with BP as well as lower extremity swelling.  Patient instructed to remain off of HCTZ.  Patient to return in 1 week for blood pressure check and check of her lower extremity swelling.  At that time if patient remains hypertensive  would consider restarting a calcium channel blocker at that time.   Patient given return precautions including headache, vision changes, chest pain, shortness of breath, or worsening swelling.  We will also obtain repeat BMP as it is been 1 month since worsening renal function discovered in office.  Leg swelling Patient with 2+ pitting edema in bilateral lower extremities to ankles.  This is new for this patient.  Normal echo in 2017.  Will repeat echocardiogram on Monday.  Patient has referral for nephrology and patient will call in order to schedule an appointment as she has not heard from them.  Will send patient on Lasix 20 mg daily in order to help with swelling as well as BP.  Patient to return in 1 week for follow-up on her swelling.  Martinique Imonie Tuch, DO Family Medicine Resident PGY-2

## 2018-01-30 NOTE — Patient Instructions (Addendum)
Thank you for coming to see me today. It was a pleasure! Today we talked about:   Your blood pressure and leg swelling. We will start you on furosemide (lasix) 20mg  daily. This will make you go to the bathroom. Please return in 1 week for a BP check and to check your leg swelling. Please also try to have a low salt diet, as explained below.   We have scheduled an [place] of your heart. Please have this done. We will call you with your lab results.   Please follow-up in 1 week or sooner as needed.  If you have any questions or concerns, please do not hesitate to call the office at [phone].  Take Care,   [place] Jadavion Spoelstra, DO  Cooking With Less [employer] with less salt is one way to reduce the amount of sodium you get from food. Depending on your condition and overall health, your health care provider or diet and nutrition specialist (dietitian) may recommend that you reduce your sodium intake. Most people should have less than 2,300 milligrams (mg) of sodium each day. If you have high blood pressure (hypertension), you may need to limit your sodium to 1,500 mg each day. Follow the tips below to help reduce your sodium intake. What do I need to know about cooking with less salt? Shopping  Buy sodium-free or low-sodium products. Look for the following words on food labels: ? Low-sodium. ? Sodium-free. ? Reduced-sodium. ? No salt added. ? Unsalted.  Buy fresh or frozen vegetables. Avoid canned vegetables.  Avoid buying meats or protein foods that have been injected with broth or saline solution.  Avoid cured or smoked meats, such as hot dogs, bacon, salami, ham, and bologna. Reading food labels  Check the food label before buying or using packaged ingredients.  Look for products with no more than 140 mg of sodium in one serving.  Do not choose foods with salt as one of the first three ingredients on the ingredients list. If salt is one of the first three ingredients, it usually  means the item is high in sodium, because ingredients are listed in order of amount in the food item. Cooking  Use herbs, seasonings without salt, and spices as substitutes for salt in foods.  Use sodium-free baking soda when baking.  Grill, braise, or roast foods to add flavor with less salt.  Avoid adding salt to pasta, rice, or hot cereals while cooking.  Drain and rinse canned vegetables before use.  Avoid adding salt when cooking sweets and desserts.  Cook with low-sodium ingredients. What are some salt alternatives? The following are herbs, seasonings, and spices that can be used instead of salt to give taste to your food. Herbs should be fresh or dried. Do not choose packaged mixes. Next to the name of the herb, spice, or seasoning are some examples of foods you can pair it with. Herbs  Bay leaves - Soups, meat and vegetable dishes, and spaghetti sauce.  Basil - [employer], soups, pasta, and fish dishes.  Cilantro - Meat, poultry, and vegetable dishes.  Chili powder - Marinades and Mexican dishes.  Chives - Salad dressings and potato dishes.  Cumin - Mexican dishes, couscous, and meat dishes.  Dill - Fish dishes, sauces, and salads.  Fennel - Meat and vegetable dishes, breads, and cookies.  Garlic (do not use garlic salt) - [place] dishes, meat dishes, salad dressings, and sauces.  Marjoram - Soups, potato dishes, and meat dishes.  Oregano - Hansel Starling  and spaghetti sauce.  Parsley - Salads, soups, pasta, and meat dishes.  Rosemary - [place] dishes, salad dressings, soups, and red meats.  Saffron - Fish dishes, pasta, and some poultry dishes.  Sage - Stuffings and sauces.  Tarragon - Fish and [employer].  Thyme - Stuffing, meat, and fish dishes. Seasonings  Lemon juice - Fish dishes, poultry dishes, vegetables, and salads.  Vinegar - Salad dressings, vegetables, and fish dishes. Spices  Cinnamon - Sweet dishes, such as cakes, cookies, and  puddings.  Cloves - Gingerbread, puddings, and marinades for meats.  Curry - Vegetable dishes, fish and poultry dishes, and stir-fry dishes.  Ginger - Vegetables dishes, fish dishes, and stir-fry dishes.  Nutmeg - Pasta, vegetables, poultry, fish dishes, and custard. What are some low-sodium ingredients and foods?  Fresh or frozen fruits and vegetables with no sauce added.  Fresh or frozen whole meats, poultry, and fish with no sauce added.  Eggs.  Noodles, pasta, quinoa, rice.  Shredded or puffed wheat or puffed rice.  Regular or quick oats.  Milk, yogurt, hard cheeses, and low-sodium cheeses. Good cheese choices include Swiss, [place]. Always check the label for the serving size and sodium content.  Unsalted butter or margarine.  Unsalted nuts.  Sherbet or ice cream (keep to  cup per serving).  Homemade pudding.  Sodium-free baking soda and baking powder. This is not a complete list of low-sodium ingredients and foods. Contact your dietitian for more options. Summary  Cooking with less salt is one way to reduce the amount of sodium that you get from food.  Buy sodium-free or low-sodium products.  Check the food label before using or buying packaged ingredients.  Use herbs, seasonings without salt, and spices as substitutes for salt in foods. This information is not intended to replace advice given to you by your health care provider. Make sure you discuss any questions you have with your health care provider. Document Released: 04/16/2005 Document Revised: 04/24/2016 Document Reviewed: 04/24/2016 Elsevier Interactive Patient Education  2017 [employer].

## 2018-01-30 NOTE — Assessment & Plan Note (Addendum)
Patient with 2+ pitting edema in bilateral lower extremities to ankles.  This is new for this patient.  Normal echo in 2017.  Will repeat echocardiogram on Monday.  Patient has referral for nephrology and patient will call in order to schedule an appointment as she has not heard from them.  Will send patient on Lasix 20 mg daily in order to help with swelling as well as BP.  Patient to return in 1 week for follow-up on her swelling.

## 2018-01-31 LAB — BASIC METABOLIC PANEL
BUN / CREAT RATIO: 13 (ref 12–28)
BUN: 18 mg/dL (ref 8–27)
CO2: 24 mmol/L (ref 20–29)
CREATININE: 1.41 mg/dL — AB (ref 0.57–1.00)
Calcium: 10.2 mg/dL (ref 8.7–10.3)
Chloride: 99 mmol/L (ref 96–106)
GFR, EST AFRICAN AMERICAN: 43 mL/min/{1.73_m2} — AB (ref >59)
GFR, EST NON AFRICAN AMERICAN: 38 mL/min/{1.73_m2} — AB (ref >59)
Glucose: 118 mg/dL — ABNORMAL HIGH (ref 65–99)
POTASSIUM: 3.4 mmol/L — AB (ref 3.5–5.2)
SODIUM: 137 mmol/L (ref 134–144)

## 2018-02-03 ENCOUNTER — Ambulatory Visit (HOSPITAL_COMMUNITY)
Admission: RE | Admit: 2018-02-03 | Discharge: 2018-02-03 | Disposition: A | Discharge status: Home / Self Care | Payer: Medicare HMO | Source: Ambulatory Visit | Attending: Family Medicine | Admitting: Family Medicine

## 2018-02-03 DIAGNOSIS — I10 Essential (primary) hypertension: Secondary | ICD-10-CM | POA: Diagnosis not present

## 2018-02-03 DIAGNOSIS — M7989 Other specified soft tissue disorders: Secondary | ICD-10-CM | POA: Insufficient documentation

## 2018-02-03 NOTE — Progress Notes (Signed)
  Echocardiogram 2D Echocardiogram has been performed.  Mallory Hayes 02/03/2018, 1:42 PM

## 2018-02-05 ENCOUNTER — Ambulatory Visit: Payer: Medicare HMO

## 2018-02-05 ENCOUNTER — Ambulatory Visit (INDEPENDENT_AMBULATORY_CARE_PROVIDER_SITE_OTHER): Payer: Medicare HMO | Admitting: Podiatry

## 2018-02-05 ENCOUNTER — Encounter: Payer: Self-pay | Admitting: Podiatry

## 2018-02-05 DIAGNOSIS — M76822 Posterior tibial tendinitis, left leg: Secondary | ICD-10-CM

## 2018-02-05 DIAGNOSIS — M25572 Pain in left ankle and joints of left foot: Secondary | ICD-10-CM

## 2018-02-05 DIAGNOSIS — M779 Enthesopathy, unspecified: Secondary | ICD-10-CM | POA: Diagnosis not present

## 2018-02-05 MED ORDER — TRIAMCINOLONE ACETONIDE 10 MG/ML IJ SUSP
10.0000 mg | Freq: Once | INTRAMUSCULAR | Status: AC
  Administered 2018-02-05: 10 mg

## 2018-02-06 ENCOUNTER — Ambulatory Visit (INDEPENDENT_AMBULATORY_CARE_PROVIDER_SITE_OTHER): Payer: Medicare HMO | Admitting: Family Medicine

## 2018-02-06 ENCOUNTER — Encounter: Payer: Self-pay | Admitting: Family Medicine

## 2018-02-06 VITALS — BP 185/60 | HR 51 | Temp 98.1°F | Wt 109.8 lb

## 2018-02-06 DIAGNOSIS — I5030 Unspecified diastolic (congestive) heart failure: Secondary | ICD-10-CM | POA: Diagnosis not present

## 2018-02-06 DIAGNOSIS — N184 Chronic kidney disease, stage 4 (severe): Secondary | ICD-10-CM

## 2018-02-06 DIAGNOSIS — I10 Essential (primary) hypertension: Secondary | ICD-10-CM

## 2018-02-06 DIAGNOSIS — N183 Chronic kidney disease, stage 3 unspecified: Secondary | ICD-10-CM | POA: Insufficient documentation

## 2018-02-06 DIAGNOSIS — R634 Abnormal weight loss: Secondary | ICD-10-CM | POA: Diagnosis not present

## 2018-02-06 NOTE — Assessment & Plan Note (Signed)
Most recent ECHO shows normal EF with G2DD which has worsened from 2017. Likely contributing to her leg edema. Unable to initiate betablocker due to asymptomatic bradycardia. Continue current regimen. Consider f/u with Cards soon. She saw them last in 2017.

## 2018-02-06 NOTE — Assessment & Plan Note (Signed)
Uncontrolled. Also issue with medication adherence. She stated that she is anxious and her BP usually goes up when she comes to see the doctor. She is currently asymptomatic without Neurologic or cardio-pulm symptoms. I recommended that she takes her Lasix today as soon as she gets home and continue Lisinopril at her current dose. Home BP monitoring instruction and parameters given. ED precaution discussed. Return tomorrow for BP check and medication adjustment. I reiterate the fact that she need to take all BP meds prior to her visit. She agreed with the plan.  I recommended no strenuous activities till we are able to get her BP under control.

## 2018-02-06 NOTE — Assessment & Plan Note (Signed)
After record review, it looks like this has been ongoing for more than 6 months. I recommended TSH and HIV check today but she declined. Most recent TSH in Jan was normal. She will return to PCP soon to discuss. I encouraged her to improve on her meal, try to take 3 meals daily. Consider nutritionist referral in the future. She will benefit from MVI as well.

## 2018-02-06 NOTE — Assessment & Plan Note (Signed)
Previously on multiple nephrotoxic agents including Metformin, HCTZ  And Lisinopril. HCTZ has been held, however, she was started on Lasix for pedal swelling. Most recent creatinine level improved. F/U with Nephrologist as planned/referred. Recheck Bmet in about 2 weeks from the last.

## 2018-02-06 NOTE — Patient Instructions (Signed)
It was nice seeing you today. Your BP is still very much elevated. Unfortunately, you did not take your Lasix today, so I am unable to assess the effectiveness of your Lasix on top of the Lisinopril.  Please take your Lasix as soon as you get home. Also take your BP readings at home and document it on paper for you to bring with you to your next visit.  We will consider going up on your medication or add something new if your BP remains high. Please go to the ED should you be having a headache, change in vision, nausea or vomiting.  Please schedule Nephrology appointment as soon as you can.

## 2018-02-06 NOTE — Progress Notes (Signed)
Subjective:     Patient ID: Mallory Hayes, female   DOB: 11/05/1945, 72 y.o.   MRN: [id number]  Hypertension  This is a chronic problem. Episode onset: many years. The problem has been waxing and waning since onset. The problem is uncontrolled. Associated symptoms include peripheral edema. Pertinent negatives include no blurred vision, chest pain, headaches, orthopnea, palpitations or shortness of breath. There are no associated agents to hypertension. Risk factors for coronary artery disease include diabetes mellitus and dyslipidemia. Past treatments include ACE inhibitors and diuretics (She did  not take her Lasix today since she was coming to the doctors office. She stated that it makes her urinate a lot. However, it helped with her leg swelling./). The current treatment provides mild improvement.  [id number] appointment pending. She denies GU symptoms except from excessive urination with the use of Lasix. Weight:C/O weight loss over the last few months. She tries to eat three meals daily but not consistent with it. No GI symptoms.   Current Outpatient Medications on File Prior to Visit  Medication Sig Dispense Refill  . aspirin 81 MG tablet Take 81 mg by mouth daily.      . furosemide (LASIX) 20 MG tablet Take 1 tablet (20 mg total) by mouth daily. 30 tablet 3  . lisinopril (PRINIVIL,ZESTRIL) 20 MG tablet TAKE 1 TABLET EVERY DAY 90 tablet 3  . mometasone (NASONEX) 50 MCG/ACT nasal spray Place 2 sprays into the nose daily as needed.    . rosuvastatin (CRESTOR) 20 MG tablet Take 1 tablet (20 mg total) by mouth daily. 90 tablet 3  . sitaGLIPtin (JANUVIA) 25 MG tablet Take 1 tablet (25 mg total) by mouth daily. 30 tablet 0  . ACCU-CHEK SOFTCLIX LANCETS lancets Use to check blood sugar once daily 100 each 12  . Alcohol Swabs (B-D SINGLE USE SWABS REGULAR) PADS Use to check blood sugar once daily 100 each 3  . Blood Glucose Calibration (ACCU-CHEK AVIVA) SOLN Use as needed to calibrate glucometer  1 each 0  . Blood Glucose Monitoring Suppl (ACCU-CHEK AVIVA PLUS) w/Device KIT Use to check blood sugar once daily 1 kit 0  . Blood Pressure Monitoring (BLOOD PRESSURE CUFF) MISC 1 Device by Does not apply route daily. 1 each 0  . Blood Pressure Monitoring (BLOOD PRESSURE MONITOR AUTOMAT) DEVI Check blood pressure 3 times per week 1 Device 0  . glucose blood (ACCU-CHEK AVIVA PLUS) test strip Use to check blood sugar once daily 100 each 12  . polyethylene glycol powder (GLYCOLAX/MIRALAX) powder Take 17 g by mouth daily as needed. QS for 90 day supply 255 g 2  . ranitidine (ZANTAC) 150 MG tablet Take 1 tablet (150 mg total) by mouth 2 (two) times daily. (Patient not taking: Reported on 02/06/2018) 60 tablet 5  . vitamin E 200 UNIT capsule Take 200 Units by mouth daily.       No current facility-administered medications on file prior to visit.    Past Medical History:  Diagnosis Date  . GERD (gastroesophageal reflux disease)   . OSA (obstructive sleep apnea)   . Sleep apnea    Vitals:   02/06/18 1131 02/06/18 1134 02/06/18 1147  BP: (!) 202/80 (!) 194/98 (!) 185/60  Pulse:  (!) 51   Temp:  98.1 F (36.7 C)   SpO2:  99%   Weight: 109 lb 12.8 oz (49.8 kg)       Review of Systems  Constitutional:       Weight loss  Eyes: Negative for  blurred vision.  Respiratory: Negative.  Negative for shortness of breath.   Cardiovascular: Negative for chest pain, palpitations and orthopnea.  Gastrointestinal: Negative.   Neurological: Negative.  Negative for tremors, seizures, speech difficulty, weakness and headaches.  All other systems reviewed and are negative.      Objective:   Physical Exam  Constitutional: She is oriented to person, place, and time. She appears well-developed. No distress.  HENT:  Exophthalmos.   Eyes: Pupils are equal, round, and reactive to light. EOM are normal.  Neck: No thyromegaly present.  Cardiovascular: Normal rate, regular rhythm and normal heart sounds.   No murmur heard. Pulmonary/Chest: Effort normal and breath sounds normal. No stridor. No respiratory distress. She has no wheezes.  Abdominal: Soft. Bowel sounds are normal. She exhibits no distension and no mass. There is no tenderness.  Musculoskeletal: Normal range of motion. She exhibits no edema or deformity.  Neurological: She is alert and oriented to person, place, and time. She displays normal reflexes. No cranial nerve deficit or sensory deficit. She exhibits normal muscle tone. Coordination normal.  Nursing note and vitals reviewed.      Assessment:     Uncontrolled HTN CKD Weight loss     Plan:     Check problem list.  NB: I reminded her that Ranitidine has been recalled due to Cancer risk. She stated that she is aware and has already discontinued the med.

## 2018-02-07 ENCOUNTER — Telehealth: Payer: Self-pay

## 2018-02-07 ENCOUNTER — Ambulatory Visit (INDEPENDENT_AMBULATORY_CARE_PROVIDER_SITE_OTHER): Payer: Medicare HMO

## 2018-02-07 VITALS — BP 200/90 | HR 46

## 2018-02-07 DIAGNOSIS — I10 Essential (primary) hypertension: Secondary | ICD-10-CM

## 2018-02-07 MED ORDER — AMLODIPINE BESYLATE 5 MG PO TABS: 5.0000 mg | ORAL_TABLET | Freq: Every day | ORAL | 0 refills | Status: DC

## 2018-02-07 NOTE — Progress Notes (Signed)
Patient in to nurse visit for BP check.  BP is 192 90, pulse 46 with normal cuff. Recheck 5 minutes later, used small cuff. BP 200/90, pulse 46. Patient asymptomatic.  Preceptor, Cyndia Skeeters, in to see patient.  Follow up appt made for 02/10/18 at 2:30 on [facility] pool. Appt made with Dr Valentina Lucks for ambulatory BP monitoring on 03/10/18 at 9 am.  ED precautions reviewed again. Patient verbalized understanding.   Danley Danker, RN [facility] RN)

## 2018-02-07 NOTE — Telephone Encounter (Signed)
Patient in to nurse visit for BP check. Mentioned she has not heard from nephrology referral advised by Dr Burr Medico on 01/09/18.  No referral found in chart. Please place referral if appropriate.  Danley Danker, RN [facility] RN)

## 2018-02-07 NOTE — Patient Instructions (Addendum)
Portion Size   Choose healthier foods such as 100% whole grains, vegetables, fruits, beans, nut seeds, olive oil, most vegetable oils, fat-free dietary, wild game and fish.   Avoid sweet tea, other sweetened beverages, soda, fruit juice, cold cereal and milk and trans fat.   Eat at least 3 meals and 1-2 snacks per day.  Aim for no more than 5 hours between eating.  Eat breakfast within one hour of getting up.    Exercise at least 150 minutes per week, including weight resistance exercises 3 or 4 times per week.   Try to lose at least 7-10% of your current body weight.   Limit your salt (Sodium) intake to less than 2 gm (2000 mg) a day if you have conditions such as  elevated blood pressure, heart failure...    You may also read about DASH and/or Mediterranean diet at the following web site if you have blood pressure or heart condition. [email or web address].se [email or web address].co.za

## 2018-02-07 NOTE — Progress Notes (Signed)
CC: blood pressure Subjective: Patient presents to clinic for follow-up on hypertension.  She was seen in clinic yesterday and found to have elevated BP to 202/80.  He was also bradycardic to 51.  Lasix was added to her regimen.  She was advised to follow-up in clinic today. She states taking Lasix and lisinopril.  She denies headache, vision change, chest pain, dyspnea or orthopnea.  Reports some ankle swelling at baseline.  She states feeling anxious when she comes to doctor's office.  Denies over-the-counter pain medications other than Tylenol.  She does not watch her salt intake.  Objective Vitals:   02/07/18 1410 02/07/18 1417  BP: (!) 192/90 (!) 200/90  Pulse: (!) 46 (!) 46   GENERAL: appears well, no ditress. HEENT: PERRLA, MMM NECK: Supple no apparent JVD. LUNGS:  No IWOB, good air movement, CTAB HEART: Bradycardic to 50s.  Regular rhythm.  No murmurs. ABD:  Morbidly obese, soft, NT with active BS EXT: No apparent edema NEURO: Awake, alert and oriented appropriately.  No gross neuro deficit. PSYCH: normal affect  Assessment and plan Hypertension -Continue lisinopril 20 mg daily.  Won't go up on this due to renal function. -Continue Lasix.  Likely for diastolic CHF and leg edema -We will add amlodipine 5 mg.  Can uptitrate to 10 mg at follow-up if she tolerates and BP remains elevated. -Recommend keeping blood pressure log, watching her salt intake -Continue daily walking. -Would benefit from ambulatory blood pressure monitoring given concern for whitecoat hypertension.  We will schedule with Dr. Valentina Lucks. -Follow-up in 3 days (on Monday). -Red flags discussed in detail.  Advised her to go to ED if she have new onset chest pain, dyspnea or focal neuro symptoms.  Bradycardia: Seems chronic issue.  EKG in 2017 with sinus bradycardia.  Not on nodal blocking agents.  Not on Aricept. -Recommend checking TSH at follow-up -Can consider repeat EKG.

## 2018-02-09 NOTE — Progress Notes (Signed)
Subjective:   Patient ID: Mallory Hayes, female   DOB: 72 y.o.   MRN: [id number]   HPI Patient presents with pain in the inside of the left ankle that make it hard for her to be comfortable and states is been going on for a while   ROS      Objective:  Physical Exam  Neurovascular status intact with inflammation pain of the posterior tibial tendon as it comes underneath the navicular left     Assessment:  Acute posterior tibial tendinitis left     Plan:  H&P x-ray reviewed and today careful sheath injection administered 3 mg Kenalog 5 mg Xylocaine and advised this patient on reduced activity ice therapy and applied fascial brace to lift the arch along with supportive shoes.  Reappoint 2 weeks or earlier if issues occur   X-ray indicates moderate depression of the arch but did not indicate any navicular pathology

## 2018-02-10 ENCOUNTER — Other Ambulatory Visit: Payer: Self-pay

## 2018-02-10 ENCOUNTER — Ambulatory Visit (INDEPENDENT_AMBULATORY_CARE_PROVIDER_SITE_OTHER): Payer: Medicare HMO | Admitting: Family Medicine

## 2018-02-10 VITALS — BP 198/86 | HR 47 | Temp 98.1°F | Ht 61.0 in | Wt 107.0 lb

## 2018-02-10 DIAGNOSIS — I10 Essential (primary) hypertension: Secondary | ICD-10-CM | POA: Diagnosis not present

## 2018-02-10 DIAGNOSIS — N183 Chronic kidney disease, stage 3 unspecified: Secondary | ICD-10-CM

## 2018-02-10 MED ORDER — AMLODIPINE BESYLATE 10 MG PO TABS: 10.0000 mg | ORAL_TABLET | Freq: Every day | ORAL | 2 refills | Status: DC

## 2018-02-10 NOTE — Progress Notes (Signed)
    Subjective:  Mallory Hayes is a 72 y.o. female who presents to the [facility] today for HTN follow up  HPI:  Hypertension  Patient states that she has had uncontrolled blood pressure ever since being taken off her HCTZ back in September due to worsening renal function.  She said that she was referred to a kidney doctor at that time but has not heard back regarding an appointment.  She has been here several times for blood pressure rechecks since.  She has been taking lisinopril 20 mg daily, Lasix 20 mg daily and most recently was started on Norvasc 5 mg daily.  She says that her most recent blood pressure check that it was still elevated.  She actually just went and bought a home monitor but has not started checking her blood pressures at home yet because she wanted to make sure that the monitor was accurate. She feels well today and has no complaints. ROS: taking medications as instructed, no medication side effects noted, patient does not perform home BP monitoring, no TIA's, no chest pain on exertion, no dyspnea on exertion, no swelling of ankles and no orthostatic dizziness or lightheadedness.     ROS: Per HPI  Social Hx: She reports that she has never smoked. She has never used smokeless tobacco. She reports that she does not drink alcohol or use drugs. She walks for exercise regularly.  Objective:  Physical Exam: BP (!) 198/86   Pulse (!) 47   Temp 98.1 F (36.7 C) (Oral)   Ht 5\' 1"  (1.549 m)   Wt 107 lb (48.5 kg)   SpO2 93%   BMI 20.22 kg/m   Gen: NAD, resting comfortably CV: RRR with no murmurs appreciated Pulm: NWOB, CTAB with no crackles, wheezes, or rhonchi GI: Normal bowel sounds present. Soft, Nontender, Nondistended. MSK: no edema, cyanosis, or clubbing noted Skin: warm, dry Neuro: grossly normal, moves all extremities Psych: Normal affect and thought content   Assessment/Plan:  Essential hypertension Uncontrolled despite being on 3 different medications.  However  she is not maxed out on any of these medications at this time. She is thankfully asymptomatic. Per chart review she was discontinued off of HCTZ on 01/09/2018 due to acute concern for an acute kidney injury and had a relatively normal renal ultrasound at that time that only showed a benign renal cyst.  There is no referral to nephrology that I can see so I will place another one today.  Her kidney fault function also seems to be improving, so will recheck BMP again today.  I hesitate to call is a resistant hypertension as she is not on max doses of any of her antihypertensives at this time.  Increase Norvasc to 10 mg daily, patient will monitor at home over the next 2 weeks and call back with her home readings.  If her kidney function has improved, would be reasonable to increase her lisinopril.   Bufford Lope, DO PGY-3, [facility] 02/10/2018 2:33 PM

## 2018-02-10 NOTE — Patient Instructions (Addendum)
Keep taking lisinopril and lasix.   Increase amlodipine from 5mg  daily to 10mg  daily.   Check your blood pressure at home. Call me in 2 weeks with your readings.   We are checking some labs today. If results require attention, either myself or my nurse will get in touch with you. If everything is normal, you will get a letter in the mail or a message in My Chart. Please give [place] a call if you do not hear from [place] after 2 weeks.   Please bring all of your medications with you to each visit.   Sign up for My Chart to have easy access to your labs results, and communication with your primary care physician.  Feel free to call with any questions or concerns at any time, at [phone].   Take care,  Dr. Bufford Lope, [place]

## 2018-02-10 NOTE — Assessment & Plan Note (Signed)
Uncontrolled despite being on 3 different medications.  However she is not maxed out on any of these medications at this time. She is thankfully asymptomatic. Per chart review she was discontinued off of HCTZ on 01/09/2018 due to acute concern for an acute kidney injury and had a relatively normal renal ultrasound at that time that only showed a benign renal cyst.  There is no referral to nephrology that I can see so I will place another one today.  Her kidney fault function also seems to be improving, so will recheck BMP again today.  I hesitate to call is a resistant hypertension as she is not on max doses of any of her antihypertensives at this time.  Increase Norvasc to 10 mg daily, patient will monitor at home over the next 2 weeks and call back with her home readings.  If her kidney function has improved, would be reasonable to increase her lisinopril.

## 2018-02-11 ENCOUNTER — Encounter: Payer: Self-pay | Admitting: Family Medicine

## 2018-02-11 ENCOUNTER — Other Ambulatory Visit: Payer: Self-pay | Admitting: Student in an Organized Health Care Education/Training Program

## 2018-02-11 DIAGNOSIS — N183 Chronic kidney disease, stage 3 unspecified: Secondary | ICD-10-CM

## 2018-02-11 LAB — BASIC METABOLIC PANEL
BUN / CREAT RATIO: 16 (ref 12–28)
BUN: 21 mg/dL (ref 8–27)
CO2: 25 mmol/L (ref 20–29)
CREATININE: 1.3 mg/dL — AB (ref 0.57–1.00)
Calcium: 10 mg/dL (ref 8.7–10.3)
Chloride: 98 mmol/L (ref 96–106)
GFR calc Af Amer: 48 mL/min/{1.73_m2} — ABNORMAL LOW (ref >59)
GFR, EST NON AFRICAN AMERICAN: 41 mL/min/{1.73_m2} — AB (ref >59)
GLUCOSE: 136 mg/dL — AB (ref 65–99)
Potassium: 3.8 mmol/L (ref 3.5–5.2)
Sodium: 140 mmol/L (ref 134–144)

## 2018-02-11 NOTE — Telephone Encounter (Signed)
Placed nephrology referral again.

## 2018-02-13 ENCOUNTER — Other Ambulatory Visit: Payer: Self-pay | Admitting: Student in an Organized Health Care Education/Training Program

## 2018-02-13 DIAGNOSIS — E1169 Type 2 diabetes mellitus with other specified complication: Secondary | ICD-10-CM

## 2018-02-14 ENCOUNTER — Other Ambulatory Visit: Payer: Self-pay | Admitting: *Deleted

## 2018-02-14 NOTE — Telephone Encounter (Signed)
Pt is waiting on refill of Januvia from mail order.  Received fax from pharmacy, pt is requesting a one month supply that she can pick up while waiting for Mail order.  Madline Oesterling, Salome Spotted, CMA

## 2018-02-17 ENCOUNTER — Other Ambulatory Visit: Payer: Self-pay

## 2018-02-17 DIAGNOSIS — E1169 Type 2 diabetes mellitus with other specified complication: Secondary | ICD-10-CM

## 2018-02-17 MED ORDER — SITAGLIPTIN PHOSPHATE 25 MG PO TABS: 25.0000 mg | ORAL_TABLET | Freq: Every day | ORAL | 1 refills | Status: DC

## 2018-02-17 MED ORDER — SITAGLIPTIN PHOSPHATE 25 MG PO TABS: 25.0000 mg | ORAL_TABLET | Freq: Every day | ORAL | 0 refills | Status: DC

## 2018-02-17 NOTE — Telephone Encounter (Signed)
Completed under separate encounter. Zacharee Gaddie, RN ([facility] RN)  

## 2018-02-17 NOTE — Telephone Encounter (Signed)
Re-sent 30 day supply to local [facility] and 90 day supply to [facility].  Danley Danker, RN [facility] RN)

## 2018-03-04 ENCOUNTER — Other Ambulatory Visit: Payer: Self-pay | Admitting: *Deleted

## 2018-03-04 DIAGNOSIS — I10 Essential (primary) hypertension: Secondary | ICD-10-CM

## 2018-03-04 MED ORDER — AMLODIPINE BESYLATE 10 MG PO TABS: 10.0000 mg | ORAL_TABLET | Freq: Every day | ORAL | 3 refills | Status: DC

## 2018-03-05 ENCOUNTER — Encounter: Payer: Self-pay | Admitting: Podiatry

## 2018-03-05 ENCOUNTER — Ambulatory Visit: Payer: Medicare HMO | Admitting: Podiatry

## 2018-03-05 DIAGNOSIS — B351 Tinea unguium: Secondary | ICD-10-CM

## 2018-03-05 DIAGNOSIS — E119 Type 2 diabetes mellitus without complications: Secondary | ICD-10-CM | POA: Diagnosis not present

## 2018-03-05 DIAGNOSIS — M79676 Pain in unspecified toe(s): Secondary | ICD-10-CM | POA: Diagnosis not present

## 2018-03-05 NOTE — Progress Notes (Signed)
Patient ID: Mallory Hayes, female   DOB: 1946/01/25, 72 y.o.   MRN: [id number] Complaint:  Visit Type: Patient returns to my office for continued preventative foot care services. Complaint: Patient states" my nails have grown long and thick and become painful to walk and wear shoes" Patient has been diagnosed with DM with no foot complications. The patient presents for preventative foot care services. No changes to ROS  Podiatric Exam: Vascular: dorsalis pedis and posterior tibial pulses are palpable bilateral. Capillary return is immediate. Temperature gradient is WNL. Skin turgor WNL  Sensorium: Normal Semmes Weinstein monofilament test. Normal tactile sensation bilaterally. Nail Exam: Pt has thick disfigured discolored nails with subungual debris noted bilateral entire nail hallux through fifth toenails Ulcer Exam: There is no evidence of ulcer or pre-ulcerative changes or infection. Orthopedic Exam: Muscle tone and strength are WNL. No limitations in general ROM. No crepitus or effusions noted. HAV  B/L and tailors bunion  B/L Skin: No Porokeratosis. No infection or ulcers  Diagnosis:  Onychomycosis, , Pain in right toe, pain in left toes  Treatment & Plan Procedures and Treatment: Consent by patient was obtained for treatment procedures. The patient understood the discussion of treatment and procedures well. All questions were answered thoroughly reviewed. Debridement of mycotic and hypertrophic toenails, 1 through 5 bilateral and clearing of subungual debris. No ulceration, no infection noted.   Return Visit-Office Procedure: Patient instructed to return to the office for a follow up visit 3 months for continued evaluation and treatment.   Gardiner Barefoot DPM

## 2018-03-10 ENCOUNTER — Ambulatory Visit: Payer: Medicare HMO | Admitting: Pharmacist

## 2018-03-24 ENCOUNTER — Ambulatory Visit: Payer: Medicare HMO | Admitting: Pharmacist

## 2018-04-07 ENCOUNTER — Other Ambulatory Visit: Payer: Self-pay

## 2018-04-07 NOTE — Patient Outreach (Signed)
[facility]) Care Management  04/07/2018   Marialuisa Basara [birth year] [id number]  Subjective: Successful telephone outreach to the patient for assessment.  HIPAA verified.  The patient states that she has not been feeling well. She states that she her blood pressure has been elevated.  Her blood pressure this morning was 173/79 and heart rate was 56. She has been to her physician office and her amlodipine was increased from 5 to 10 mg.  Discussed with the patient about her salt intake and diet.  She verbalized understanding.  The patient reports that she has been having spasms in her scalp neck and back.  She has been taking a 1/2 tablet of Baclofen 10 mg PRN. The patient denies any pain or falls.  She states that she did not check her blood sugar this morning yet. The patient is not checking her blood sugars on a regular basis.  The patient is taking her medications as prescribed .  She states that she is still walking daily.  She has an appointment with her nephrologist on 04/08/2018 at 1 pm and a follow up appointment with her physician to recheck her a1c, bring in her blood pressure readings and get a flu shot this Friday 04/11/2018.   Current Medications:  Current Outpatient Medications  Medication Sig Dispense Refill  . ACCU-CHEK SOFTCLIX LANCETS lancets Use to check blood sugar once daily 100 each 12  . Alcohol Swabs (B-D SINGLE USE SWABS REGULAR) PADS Use to check blood sugar once daily 100 each 3  . amLODipine (NORVASC) 10 MG tablet Take 1 tablet (10 mg total) by mouth daily. 90 tablet 3  . aspirin 81 MG tablet Take 81 mg by mouth daily.      . baclofen (LIORESAL) 10 MG tablet Take 10 mg by mouth 3 (three) times daily as needed for muscle spasms.    . Blood Glucose Calibration (ACCU-CHEK AVIVA) SOLN Use as needed to calibrate glucometer 1 each 0  . Blood Glucose Monitoring Suppl (ACCU-CHEK AVIVA PLUS) w/Device KIT Use to check blood sugar once daily 1 kit 0  . Blood  Pressure Monitoring (BLOOD PRESSURE CUFF) MISC 1 Device by Does not apply route daily. 1 each 0  . Blood Pressure Monitoring (BLOOD PRESSURE MONITOR AUTOMAT) DEVI Check blood pressure 3 times per week 1 Device 0  . furosemide (LASIX) 20 MG tablet Take 1 tablet (20 mg total) by mouth daily. 30 tablet 3  . glucose blood (ACCU-CHEK AVIVA PLUS) test strip Use to check blood sugar once daily 100 each 12  . lisinopril (PRINIVIL,ZESTRIL) 20 MG tablet TAKE 1 TABLET EVERY DAY 90 tablet 3  . mometasone (NASONEX) 50 MCG/ACT nasal spray Place 2 sprays into the nose daily as needed.    . polyethylene glycol powder (GLYCOLAX/MIRALAX) powder Take 17 g by mouth daily as needed. QS for 90 day supply 255 g 2  . ranitidine (ZANTAC) 150 MG tablet Take 1 tablet (150 mg total) by mouth 2 (two) times daily. 60 tablet 5  . rosuvastatin (CRESTOR) 20 MG tablet Take 1 tablet (20 mg total) by mouth daily. 90 tablet 3  . sitaGLIPtin (JANUVIA) 25 MG tablet Take 1 tablet (25 mg total) by mouth daily. 90 tablet 1  . vitamin E 200 UNIT capsule Take 200 Units by mouth daily.       No current facility-administered medications for this visit.     Functional Status:  In your present state of health, do you have any difficulty performing the  following activities: 01/06/2018  Hearing? N  Vision? N  Difficulty concentrating or making decisions? N  Walking or climbing stairs? N  Dressing or bathing? N  Doing errands, shopping? N  Some recent data might be hidden    Fall/Depression Screening: Fall Risk  04/07/2018 02/10/2018 01/09/2018  Falls in the past year? 0 No No  Number falls in past yr: - - -  Injury with Fall? - - -  Risk Factor Category  - - -  Risk for fall due to : - - -  Follow up - - -   PHQ 2/9 Scores 02/10/2018 01/09/2018 10/30/2017 09/20/2017 05/15/2017 04/12/2017 04/03/2017  PHQ - 2 Score 0 0 0 0 0 1 2  PHQ- 9 Score - - - - - - 5    Assessment: Patient will continue to benefit from health coach outreach for  disease management and support.  THN CM Care Plan Problem One     Most Recent Value  THN Long Term Goal   in 30 days the patient will decrease her a1c of 8.4 by 1-2 points  [facility] Long Term Goal Start Date  04/07/18  Interventions for Problem One Long Term Goal  Discussed checking blood sugars daily,  ffod and fluid intake exercise and medication adherence.       Plan:  RN Health Coach will contact patient in the month of January and patient agrees to next outreach. RN Health Coach will send the patient a booklet  "A matter of choice blood pressure control"  Rosalee Kaufman, BSN, [place]:  [phone]  Fax: [phone]

## 2018-04-07 NOTE — Patient Outreach (Signed)
[place] [facility]) Care Management  04/07/2018  Mallory Hayes Aug 31, 1945 [id number]   RN Health Coach received a call from the patient requesting a pill box if we have them.  I informed the patient that we do have some pill boxes and I will have one sent in the mail to her.  She thanked me and was very Patent attorney.  Plan:  RN Health Coach will outreach the patient at the next scheduled interval.  [name] RN, BSN, [place]:  [phone]  Fax: [phone]

## 2018-04-08 DIAGNOSIS — E119 Type 2 diabetes mellitus without complications: Secondary | ICD-10-CM | POA: Diagnosis not present

## 2018-04-08 DIAGNOSIS — I129 Hypertensive chronic kidney disease with stage 1 through stage 4 chronic kidney disease, or unspecified chronic kidney disease: Secondary | ICD-10-CM | POA: Diagnosis not present

## 2018-04-08 DIAGNOSIS — G4733 Obstructive sleep apnea (adult) (pediatric): Secondary | ICD-10-CM | POA: Diagnosis not present

## 2018-04-08 DIAGNOSIS — E1122 Type 2 diabetes mellitus with diabetic chronic kidney disease: Secondary | ICD-10-CM | POA: Diagnosis not present

## 2018-04-08 DIAGNOSIS — Z23 Encounter for immunization: Secondary | ICD-10-CM | POA: Diagnosis not present

## 2018-04-08 DIAGNOSIS — N183 Chronic kidney disease, stage 3 (moderate): Secondary | ICD-10-CM | POA: Diagnosis not present

## 2018-04-11 ENCOUNTER — Ambulatory Visit: Payer: Medicare HMO | Admitting: Family Medicine

## 2018-04-11 ENCOUNTER — Other Ambulatory Visit: Payer: Self-pay | Admitting: Family Medicine

## 2018-04-17 ENCOUNTER — Encounter: Payer: Self-pay | Admitting: Pharmacist

## 2018-04-17 ENCOUNTER — Ambulatory Visit (INDEPENDENT_AMBULATORY_CARE_PROVIDER_SITE_OTHER): Payer: Medicare HMO | Admitting: Pharmacist

## 2018-04-17 DIAGNOSIS — I10 Essential (primary) hypertension: Secondary | ICD-10-CM | POA: Diagnosis not present

## 2018-04-17 DIAGNOSIS — E1169 Type 2 diabetes mellitus with other specified complication: Secondary | ICD-10-CM

## 2018-04-17 MED ORDER — SPIRONOLACTONE 25 MG PO TABS: 25.0000 mg | ORAL_TABLET | Freq: Every day | ORAL | 1 refills | Status: DC

## 2018-04-17 NOTE — Assessment & Plan Note (Signed)
Blood glucose control improved however remains above goal A1C at 8.4 She is currently not interested in taking Januvia as she thinks this may be the cause of her scalp itching.  At next PCP visit,  Consider addition of SGLT2 inhibitor Jardiance 10mg  at next visit for BP control, DM control and Kidney protection.  Stop Januvia at that time possibly? Consider reducing or dosing furosemide PRN at that time.

## 2018-04-17 NOTE — Progress Notes (Signed)
S:    Patient arrives in good spirits, ambulating without assistance.    Presents to the clinic for hypertension evaluation, counseling, and management. Patient was referred by PCP, Dr. Burr Medico, on 02/11/2018.  Last Rx Clinic visit 07/11/2017 - at that time we were utilizing Ace/Thiazide combination therapy.  Since that time she has added amlodipine and furosemide has replaced HCTZ.   Patient reports adherence with medications.  However admits she has not taken Januvia for several days as she thinks it is worsening her hair/scalp irritation.   Current BP Medications include:  Amlodipine 10mg  daily , Furosemide 20mg  daily, Lisinopril 20mg  daily  Antihypertensives tried in the past include: HCTZ  Dietary habits include: include salty foods  Family Hx of Cardiac Events in Aunts with Hypertension.    Home BP readings: Systolic readings 742V   O:  Physical Exam Musculoskeletal:        General: No swelling.     Comments: Legs bilaterally - no swelling    Review of Systems  Cardiovascular: Negative for leg swelling.  Musculoskeletal: Positive for neck pain.  Skin: Positive for rash.       Itching is scalp at base of skull  Psychiatric/Behavioral: The patient is nervous/anxious.   All other systems reviewed and are negative.   Last 3 Office BP readings: BP Readings from Last 3 Encounters:  04/17/18 (!) 168/82  02/10/18 (!) 198/86  02/07/18 (!) 200/90    BMET    Component Value Date/Time   NA 140 02/10/2018 1504   K 3.8 02/10/2018 1504   CL 98 02/10/2018 1504   CO2 25 02/10/2018 1504   GLUCOSE 136 (H) 02/10/2018 1504   GLUCOSE 159 (H) 01/19/2016 1059   BUN 21 02/10/2018 1504   CREATININE 1.30 (H) 02/10/2018 1504   CREATININE 1.27 (H) 01/19/2016 1059   CALCIUM 10.0 02/10/2018 1504   GFRNONAA 41 (L) 02/10/2018 1504   GFRNONAA 43 (L) 01/19/2016 1059   GFRAA 48 (L) 02/10/2018 1504   GFRAA 50 (L) 01/19/2016 1059    Renal function: CrCl cannot be calculated (Patient's  most recent lab result is older than the maximum 21 days allowed.).  Clinical ASCVD: No  The 10-year ASCVD risk score Mikey Bussing DC Jr., et al., 2013) is: 34%   Values used to calculate the score:     Age: 52 years     Sex: Female     Is Non-Hispanic African American: Yes     Diabetic: Yes     Tobacco smoker: No     Systolic Blood Pressure: 956 mmHg     Is BP treated: Yes     HDL Cholesterol: 70 mg/dL     Total Cholesterol: 146 mg/dL   A/P: Hypertension longstanding in a patient with family history of Cardiovascular Events related to Hypertension.  Currently amlodipine 10mg  and lisinopril 20mg  daily with furosemide 20mg  daily.  BP Goal = < 387 mmHg systolic  Patient is adherent with current BP medications.  -Initiated a trial of spironolactone 25mg  once daily.  -F/u labs ordered - BMET in 1 week. (NOT currently taking K supplement) - Has lower range Potassium.  -Counseled on lifestyle modifications for blood pressure control including reduced dietary sodium  Blood glucose control improved however remains above goal A1C at 8.4 She is currently not interested in taking Januvia as she thinks this may be the cause of her scalp itching.  At next PCP visit,  Consider addition of SGLT2 inhibitor Jardiance 10mg  at next visit for BP  control, DM control and Kidney protection.  Consider reducing or PRN furosemide at that time.    Results reviewed and written information provided.   Total time in face-to-face counseling 30 minutes.   F/U Clinic Visit in after next PCP visit for potential Amb BP monitor. (Her bradycardia may make this suboptimal). Her last Amb BP monitor was unsuccessful.

## 2018-04-17 NOTE — Assessment & Plan Note (Signed)
Hypertension longstanding in a patient with family history of Cardiovascular Events related to Hypertension.  Currently amlodipine 10mg  and lisinopril 20mg  daily with furosemide 20mg  daily.  BP Goal = < 272 mmHg systolic  Patient is adherent with current BP medications.  -Initiated a trial of spironolactone 25mg  once daily.  -F/u labs ordered - BMET in 1 week. (NOT currently taking K supplement) - Has lower range Potassium.  -Counseled on lifestyle modifications for blood pressure control including reduced dietary sodium  Blood glucose control improved however remains above goal A1C at 8.4 She is currently not interested in taking Januvia as she thinks this may be the cause of her scalp itching.  At next PCP visit,  Consider addition of SGLT2 inhibitor Jardiance 10mg  at next visit for BP control, DM control and Kidney protection.  Consider reducing or PRN furosemide at that time.

## 2018-04-17 NOTE — Patient Instructions (Signed)
Start Spironolactone 25mg  daily  Come in next week to have lab work.   Follow up with Dr. Ardelia Mems on January 2nd.

## 2018-04-18 ENCOUNTER — Ambulatory Visit: Payer: Medicare HMO

## 2018-04-18 NOTE — Progress Notes (Signed)
Patient ID: Mallory Hayes, female   DOB: 04-13-1946, 72 y.o.   MRN: [id number] Reviewed: Agree with Dr. Graylin Shiver documentation and management.

## 2018-04-25 ENCOUNTER — Other Ambulatory Visit: Payer: Medicare HMO

## 2018-04-25 DIAGNOSIS — E1169 Type 2 diabetes mellitus with other specified complication: Secondary | ICD-10-CM | POA: Diagnosis not present

## 2018-04-26 LAB — BASIC METABOLIC PANEL
BUN/Creatinine Ratio: 15 (ref 12–28)
BUN: 21 mg/dL (ref 8–27)
CO2: 23 mmol/L (ref 20–29)
Calcium: 9.7 mg/dL (ref 8.7–10.3)
Chloride: 94 mmol/L — ABNORMAL LOW (ref 96–106)
Creatinine, Ser: 1.41 mg/dL — ABNORMAL HIGH (ref 0.57–1.00)
GFR calc Af Amer: 43 mL/min/{1.73_m2} — ABNORMAL LOW (ref >59)
GFR calc non Af Amer: 38 mL/min/{1.73_m2} — ABNORMAL LOW (ref >59)
Glucose: 431 mg/dL — ABNORMAL HIGH (ref 65–99)
Potassium: 4.2 mmol/L (ref 3.5–5.2)
Sodium: 133 mmol/L — ABNORMAL LOW (ref 134–144)

## 2018-05-01 ENCOUNTER — Telehealth: Payer: Self-pay | Admitting: Pharmacist

## 2018-05-01 ENCOUNTER — Ambulatory Visit: Payer: Medicare HMO | Admitting: Family Medicine

## 2018-05-01 NOTE — Telephone Encounter (Signed)
Called and shared lab results were stable from BMET obtained last week.   134/,68 and  143/67 home BPs were reported.   She will follow up with PCP Dr. Ardelia Mems next Tuesday.  Consider Amb BP monitor after that visit.

## 2018-05-06 ENCOUNTER — Encounter: Payer: Self-pay | Admitting: Family Medicine

## 2018-05-06 ENCOUNTER — Ambulatory Visit (INDEPENDENT_AMBULATORY_CARE_PROVIDER_SITE_OTHER): Payer: Medicare HMO | Admitting: Family Medicine

## 2018-05-06 ENCOUNTER — Other Ambulatory Visit: Payer: Self-pay

## 2018-05-06 VITALS — BP 132/78 | HR 46 | Temp 97.6°F | Ht 61.0 in | Wt 114.0 lb

## 2018-05-06 DIAGNOSIS — E1169 Type 2 diabetes mellitus with other specified complication: Secondary | ICD-10-CM

## 2018-05-06 DIAGNOSIS — I10 Essential (primary) hypertension: Secondary | ICD-10-CM | POA: Diagnosis not present

## 2018-05-06 LAB — POCT GLYCOSYLATED HEMOGLOBIN (HGB A1C): HbA1c, POC (controlled diabetic range): 11.6 % — AB (ref 0.0–7.0)

## 2018-05-06 MED ORDER — TETANUS-DIPHTH-ACELL PERTUSSIS 5-2.5-18.5 LF-MCG/0.5 IM SUSP: 0.5000 mL | Freq: Once | INTRAMUSCULAR | 0 refills | Status: AC

## 2018-05-06 NOTE — Progress Notes (Signed)
error 

## 2018-05-06 NOTE — Patient Instructions (Signed)
We'll call you and let you know about the new diabetes medicine  Check sugar once per day Follow up in 4 weeks, bring sugar log with you to that appointment  Stop spironolactone  Be well, Dr. Ardelia Mems

## 2018-05-06 NOTE — Progress Notes (Addendum)
   Subjective:    HPI:  Ms. Icess Bertoni is a 73yo F who presents for f/u on HTN, DM.   She reports recent 1.5 wk hx of neck muscle spasms that resolved prior to 04/30/2018 with help of Tylenol.   HTN: Ms. Teegarden reports adherence with Amlodipine, Furosemide, Lisinopril and reports no unwanted side effects. She states she has not taken Spirinolactone in about a week as she forgets to take it with food and then decides not to take it. She reports no recent headaches, change in vision, dizziness, weakness.  DM: Ms. Aja was seen by [facility] recently and reported no significant impairment in kidney structure. Reports having eyes checked recently with no significant change. She was recently started on Jardiance and Metformin was stopped. Her A1c increased from 8.4 in 08/2017 to 11.6 today. She reports starting to eat 5 sugar cookies a day in the last few months as well as eating a hamburger every night. She is eager to make lifestyle and medication changes to reduce her A1c.     Objective:     BP 132/78   Pulse (!) 46   Temp 97.6 F (36.4 C) (Oral)   Ht 5\' 1"  (1.549 m)   Wt 114 lb (51.7 kg)   SpO2 99%   BMI 21.54 kg/m   Physical Exam Cardiovascular:     Rate and Rhythm: Normal rate and regular rhythm.     Pulses: Normal pulses.     Heart sounds: Normal heart sounds.  Pulmonary:     Effort: Pulmonary effort is normal.     Breath sounds: Normal breath sounds.  Abdominal:     General: Bowel sounds are normal.     Palpations: Abdomen is soft.       Assessment & Plan:   Health Maintenance: - gave rx for Tdap  Essential hypertension Currently taking Amlodipine, Lisinopril, Furosemide. BP today is controlled at 132/78. Stop spironolactone as patient not taking and blood pressure is now controlled.  Type 2 diabetes mellitus A1c increased from 8.4 to 11.6  -Lifestyle: discussed recommended dietary modifications including reduce cookie consumption from 5/day to 1/day, breaking  down into small chunks to nibble on during day and evening. Reduce hamburger consumption to every other night -Medication: likely will add jardiance, though I am getting a flag with her renal function. Will discuss with Dr. Valentina Lucks before prescribing. Patient will be contacted with medication plan -Asked patient to check fasting sugars daily & follow up in 1 month with log. -Discussed increased risk of UTIs with Jardiance, provided return precuations   Neck spasms resolved - no plans to work this up at present  Boykin Peek, Medical Student  Patient seen along with [facility]. I personally evaluated this patient along with the student, and verified all aspects of the history, physical exam, and medical decision making as documented by the student. I agree with the student's documentation and have made all necessary edits.  Chrisandra Netters, MD [facility]

## 2018-05-08 ENCOUNTER — Other Ambulatory Visit: Payer: Self-pay

## 2018-05-08 NOTE — Patient Outreach (Signed)
[place] [facility]) Care Management  05/08/2018  Kaitlynn Tramontana April 27, 1946 [id number]    1st unsuccessful outreach attempt to the patient for assessment. No answer.  HIPAA compliant voicemail left with contact information.  Plan: RN Health Coach will make outreach attempt the patient within thirty business days.  [name] RN, BSN, [place] Direct Dial:  [phone]  Fax: [phone]

## 2018-05-09 NOTE — Assessment & Plan Note (Signed)
Currently taking Amlodipine, Lisinopril, Furosemide. BP today is controlled at 132/78. Stop spironolactone as patient not taking and blood pressure is now controlled.

## 2018-05-09 NOTE — Assessment & Plan Note (Signed)
A1c increased from 8.4 to 11.6  -Lifestyle: discussed recommended dietary modifications including reduce cookie consumption from 5/day to 1/day, breaking down into small chunks to nibble on during day and evening. Reduce hamburger consumption to every other night -Medication: likely will add jardiance, though I am getting a flag with her renal function. Will discuss with Dr. Valentina Lucks before prescribing. Patient will be contacted with medication plan -Asked patient to check fasting sugars daily & follow up in 1 month with log. -Discussed increased risk of UTIs with Jardiance, provided return precuations

## 2018-05-10 ENCOUNTER — Other Ambulatory Visit: Payer: Self-pay | Admitting: Family Medicine

## 2018-05-10 DIAGNOSIS — I10 Essential (primary) hypertension: Secondary | ICD-10-CM

## 2018-05-15 ENCOUNTER — Telehealth: Payer: Self-pay | Admitting: Family Medicine

## 2018-05-15 NOTE — Telephone Encounter (Signed)
error 

## 2018-05-16 MED ORDER — EMPAGLIFLOZIN 10 MG PO TABS: 10.0000 mg | ORAL_TABLET | Freq: Every day | ORAL | 1 refills | Status: DC

## 2018-05-16 NOTE — Telephone Encounter (Signed)
Patient aware. Alisa Brake, RN ([facility] RN)  

## 2018-05-16 NOTE — Addendum Note (Signed)
Addended by: [name] on: 05/16/2018 10:01 AM   Modules accepted: Orders

## 2018-05-16 NOTE — Telephone Encounter (Signed)
Discussed starting jardiance with Dr. Valentina Lucks. There is data to support its use as long as eGFR >30. He is in agreement with starting jardiance 78m in this patient, and not titrating upward.  [facility] rx in to [facility] Attempted to reach patient to let her know about this medication, but no answer. LVM asking her to call back.  When she returns the call please let her know [facility]  Thanks, BLeeanne Rio MD

## 2018-06-02 ENCOUNTER — Other Ambulatory Visit: Payer: Self-pay | Admitting: Family Medicine

## 2018-06-10 ENCOUNTER — Ambulatory Visit: Payer: Medicare HMO | Admitting: Family Medicine

## 2018-06-11 ENCOUNTER — Other Ambulatory Visit: Payer: Self-pay

## 2018-06-11 NOTE — Patient Outreach (Signed)
[place] [facility]) Care Management  06/11/2018   Mallory Hayes 1945-12-24 [id number]  Subjective: Successful outreach to the patient.  HIPAA verified.  The patient states that she has not been able to check her blood sugars for the last two weeks. Her glucometer broke and she just received another one in the mail this week.  She states that she has been very thirsty.  She states that she has been drinking a ginger ale and eating cookies daily.  I explained to the patient that eating cookies and drinking ginger ale is not good on a daily basis. We discussed healthy food choices and portion size.  I encouraged her to start checking her blood sugars daily and writing down her results.   She states that she does exercise on a daily basis. She denies any pain or falls and takes her medication as prescribed. She has an appointment next Tuesday to recheck her a1c.  Current Medications:  Current Outpatient Medications  Medication Sig Dispense Refill  . ACCU-CHEK SOFTCLIX LANCETS lancets USE TO CHECK BLOOD SUGAR ONCE DAILY 100 each 12  . Alcohol Swabs (B-D SINGLE USE SWABS REGULAR) PADS Use to check blood sugar once daily 100 each 3  . amLODipine (NORVASC) 10 MG tablet Take 1 tablet (10 mg total) by mouth daily. 90 tablet 3  . aspirin 81 MG tablet Take 81 mg by mouth daily.      . Blood Glucose Calibration (ACCU-CHEK AVIVA) SOLN USE AS NEEDED TO CALIBRATE GLUCOMETER 1 each 0  . Blood Glucose Monitoring Suppl (ACCU-CHEK AVIVA PLUS) w/Device KIT USE TO CHECK BLOOD SUGAR ONCE DAILY 1 kit 0  . Blood Pressure Monitoring (BLOOD PRESSURE CUFF) MISC 1 Device by Does not apply route daily. 1 each 0  . Blood Pressure Monitoring (BLOOD PRESSURE MONITOR AUTOMAT) DEVI Check blood pressure 3 times per week 1 Device 0  . empagliflozin (JARDIANCE) 10 MG TABS tablet Take 10 mg by mouth daily. 90 tablet 1  . furosemide (LASIX) 20 MG tablet TAKE 1 TABLET BY MOUTH EVERY DAY 30 tablet 3  . lisinopril  (PRINIVIL,ZESTRIL) 20 MG tablet TAKE 1 TABLET EVERY DAY 90 tablet 3  . mometasone (NASONEX) 50 MCG/ACT nasal spray Place 2 sprays into the nose daily as needed.    . polyethylene glycol powder (GLYCOLAX/MIRALAX) powder Take 17 g by mouth daily as needed. QS for 90 day supply 255 g 2  . rosuvastatin (CRESTOR) 20 MG tablet Take 1 tablet (20 mg total) by mouth daily. 90 tablet 3  . vitamin E 200 UNIT capsule Take 200 Units by mouth daily.      Marland Kitchen glucose blood test strip CHECK BLOOD SUGAR ONE TIME DAILY 100 each 12  . sitaGLIPtin (JANUVIA) 25 MG tablet Take 1 tablet (25 mg total) by mouth daily. (Patient not taking: Reported on 06/11/2018) 90 tablet 1   No current facility-administered medications for this visit.     Functional Status:  In your present state of health, do you have any difficulty performing the following activities: 01/06/2018  Hearing? N  Vision? N  Difficulty concentrating or making decisions? N  Walking or climbing stairs? N  Dressing or bathing? N  Doing errands, shopping? N  Some recent data might be hidden    Fall/Depression Screening: Fall Risk  06/11/2018 04/07/2018 02/10/2018  Falls in the past year? 0 0 No  Number falls in past yr: - - -  Injury with Fall? - - -  Risk Factor Category  - - -  Risk for fall due to : - - -  Follow up - - -   PHQ 2/9 Scores 05/06/2018 02/10/2018 01/09/2018 10/30/2017 09/20/2017 05/15/2017 04/12/2017  PHQ - 2 Score 0 0 0 0 0 0 1  PHQ- 9 Score - - - - - - -    Assessment: Patient will continue to benefit from health coach outreach for disease management and support.  THN CM Care Plan Problem One     Most Recent Value  THN Long Term Goal   in 30 days the patient will decrease her a1c of 11.6 by 1-2 points  [facility] Long Term Goal Start Date  06/11/18  Interventions for Problem One Long Term Goal  Talked with the patient about her food intake and changes she needs to make and protion sizes.  Discused how high blood sugars can start to affect  the body.  Sent the patient article called all about the carbs.     Plan: RN Health Coach will contact patient in the month of March and patient agrees to next outreach.   [name] RN, BSN, [place] Direct Dial:  [phone]  Fax: [phone]

## 2018-06-13 ENCOUNTER — Emergency Department (HOSPITAL_COMMUNITY)
Admission: EM | Admit: 2018-06-13 | Discharge: 2018-06-13 | Disposition: A | Discharge status: Home / Self Care | Payer: Medicare HMO | Attending: Emergency Medicine | Admitting: Emergency Medicine

## 2018-06-13 DIAGNOSIS — I509 Heart failure, unspecified: Secondary | ICD-10-CM | POA: Diagnosis not present

## 2018-06-13 DIAGNOSIS — H538 Other visual disturbances: Secondary | ICD-10-CM | POA: Diagnosis not present

## 2018-06-13 DIAGNOSIS — R739 Hyperglycemia, unspecified: Secondary | ICD-10-CM | POA: Insufficient documentation

## 2018-06-13 DIAGNOSIS — Z5321 Procedure and treatment not carried out due to patient leaving prior to being seen by health care provider: Secondary | ICD-10-CM | POA: Diagnosis not present

## 2018-06-13 DIAGNOSIS — R531 Weakness: Secondary | ICD-10-CM | POA: Diagnosis not present

## 2018-06-13 LAB — CBC
HCT: 41.9 % (ref 36.0–46.0)
Hemoglobin: 14 g/dL (ref 12.0–15.0)
MCH: 30.1 pg (ref 26.0–34.0)
MCHC: 33.4 g/dL (ref 30.0–36.0)
MCV: 90.1 fL (ref 80.0–100.0)
PLATELETS: 159 10*3/uL (ref 150–400)
RBC: 4.65 MIL/uL (ref 3.87–5.11)
RDW: 12.1 % (ref 11.5–15.5)
WBC: 5.8 10*3/uL (ref 4.0–10.5)
nRBC: 0 % (ref 0.0–0.2)

## 2018-06-13 LAB — BASIC METABOLIC PANEL
ANION GAP: 15 (ref 5–15)
BUN: 32 mg/dL — ABNORMAL HIGH (ref 8–23)
CO2: 21 mmol/L — ABNORMAL LOW (ref 22–32)
Calcium: 10.3 mg/dL (ref 8.9–10.3)
Chloride: 98 mmol/L (ref 98–111)
Creatinine, Ser: 1.35 mg/dL — ABNORMAL HIGH (ref 0.44–1.00)
GFR calc Af Amer: 46 mL/min — ABNORMAL LOW (ref >60)
GFR calc non Af Amer: 39 mL/min — ABNORMAL LOW (ref >60)
GLUCOSE: 312 mg/dL — AB (ref 70–99)
Potassium: 4.1 mmol/L (ref 3.5–5.1)
Sodium: 134 mmol/L — ABNORMAL LOW (ref 135–145)

## 2018-06-13 LAB — CBG MONITORING, ED: Glucose-Capillary: 321 mg/dL — ABNORMAL HIGH (ref 70–99)

## 2018-06-13 NOTE — ED Notes (Signed)
Pt CBG was 321, notified Brooke(RN)

## 2018-06-13 NOTE — ED Triage Notes (Signed)
Patient c/o high blood sugars x 2 days, ranging 300-400s. No changes in diet, but patient states she recently changed from Metformin to Januvia d/t kidney function. She endorses generalized weakness and blurred vision the past couple days.

## 2018-06-13 NOTE — ED Notes (Signed)
Called for room X3, no answer

## 2018-06-16 ENCOUNTER — Ambulatory Visit (INDEPENDENT_AMBULATORY_CARE_PROVIDER_SITE_OTHER): Payer: Medicare HMO | Admitting: Family Medicine

## 2018-06-16 ENCOUNTER — Other Ambulatory Visit: Payer: Self-pay

## 2018-06-16 VITALS — BP 144/81 | HR 54 | Temp 97.4°F | Wt 110.0 lb

## 2018-06-16 DIAGNOSIS — E1169 Type 2 diabetes mellitus with other specified complication: Secondary | ICD-10-CM

## 2018-06-16 LAB — GLUCOSE, POCT (MANUAL RESULT ENTRY): POC Glucose: 437 mg/dl — AB (ref 70–99)

## 2018-06-16 MED ORDER — INSULIN GLARGINE 100 UNIT/ML ~~LOC~~ SOLN: 8.0000 [IU] | Freq: Every day | SUBCUTANEOUS | 0 refills | Status: DC

## 2018-06-16 NOTE — Assessment & Plan Note (Signed)
Patient with elevated CBG today, considered sending her for emergency room evaluation for possible DKA, however precepted with Dr. Andria Frames and we discussed that since the patient is taking oral hydration well, we will give her insulin now in our office 8 units of Lantus, and she will follow-up at her already scheduled appointment with her PCP tomorrow morning.  I relayed the plan to her PCP.  We will keep the sample of Lantus for her in our refrigerator and give it to her tomorrow when she comes.  We asked her to make a list of her questions about going on insulin.  Also discussed signs and symptoms of hypoglycemia.

## 2018-06-16 NOTE — Progress Notes (Signed)
   CC: elevated sugars  HPI  Elevated CBG - she went to UC with CBGs close to 500. Ended up leaving due to wait times. No hx of admitted for sugars. DM since 2008. She is unsure about why her a1c is newly increased from last fall to January. Her vision is foggy since last Wednesday. Seems to be equal in both eyes. Never had this before. Lots of more urination. No dysuria. Was more thirsty last week. Did drink a lot of regular ginger ale last week too. Lives alone. Retired. Feeling lightheaded this weekend, drove herself here. Not lightheaded now. Does have some constipation the last few weeks. She is tearful, worried about going on insulin. She has never been on insulin in the past.   ROS: Denies CP, SOB, abdominal pain, dysuria, changes in BMs.   CC, SH/smoking status, and VS noted  Objective: BP (!) 144/81   Pulse (!) 54   Temp (!) 97.4 F (36.3 C) (Oral)   Wt 110 lb (49.9 kg)   SpO2 99%   BMI 20.78 kg/m  Gen: NAD, alert, cooperative, and pleasant. HEENT: NCAT, EOMI, PERRL CV: RRR, no murmur Resp: CTAB, no wheezes, non-labored Ext: No edema, warm Neuro: Alert and oriented, Speech clear, No gross deficits  Assessment and plan:  Type 2 diabetes mellitus Patient with elevated CBG today, considered sending her for emergency room evaluation for possible DKA, however precepted with Dr. Andria Frames and we discussed that since the patient is taking oral hydration well, we will give her insulin now in our office 8 units of Lantus, and she will follow-up at her already scheduled appointment with her PCP tomorrow morning.  I relayed the plan to her PCP.  We will keep the sample of Lantus for her in our refrigerator and give it to her tomorrow when she comes.  We asked her to make a list of her questions about going on insulin.  Also discussed signs and symptoms of hypoglycemia.   Orders Placed This Encounter  Procedures  . Glucose (CBG)    Meds ordered this encounter  Medications  . insulin  glargine (LANTUS) 100 UNIT/ML injection    Sig: Inject 0.08 mLs (8 Units total) into the skin daily.    Dispense:  10 mL    Refill:  0    Order Specific Question:   Lot Number?    Answer:   [id number]    Order Specific Question:   Expiration Date?    Answer:   06/27/2020    Order Specific Question:   NDC    Answer:   1027-2536-64 [[id number]]    Ralene Ok, MD, PGY3 06/16/2018 3:35 PM

## 2018-06-16 NOTE — Patient Instructions (Signed)
It was a pleasure to see you today! Thank you for choosing [facility] for your primary care. Mallory Hayes was seen for high sugars.   Our plans for today were:  We gave you 8 units of lantus today, which is insulin.   You need to come back tomorrow and see Dr. Ardelia Mems.   Make a list of your questions.    Best,  Dr. Lindell Noe

## 2018-06-17 ENCOUNTER — Ambulatory Visit (INDEPENDENT_AMBULATORY_CARE_PROVIDER_SITE_OTHER): Payer: Medicare HMO | Admitting: Family Medicine

## 2018-06-17 VITALS — BP 126/60 | HR 54 | Temp 98.3°F | Wt 112.0 lb

## 2018-06-17 DIAGNOSIS — G4733 Obstructive sleep apnea (adult) (pediatric): Secondary | ICD-10-CM

## 2018-06-17 DIAGNOSIS — E1169 Type 2 diabetes mellitus with other specified complication: Secondary | ICD-10-CM | POA: Diagnosis not present

## 2018-06-17 LAB — GLUCOSE, POCT (MANUAL RESULT ENTRY): POC GLUCOSE: 391 mg/dL — AB (ref 70–99)

## 2018-06-17 NOTE — Patient Instructions (Signed)
Take insulin as directed by pharmacy team Follow up with me next Tuesday  Referring to pulmonologist for your sleep apnea  Call & schedule eye appointment  Be well, Dr. Ardelia Mems

## 2018-06-17 NOTE — Progress Notes (Signed)
CBG 

## 2018-06-18 ENCOUNTER — Telehealth: Payer: Self-pay

## 2018-06-18 NOTE — Assessment & Plan Note (Signed)
Refer back to Dr. Halford Chessman for evaluation, not on CPAP currently.

## 2018-06-18 NOTE — Progress Notes (Signed)
Date of Visit: 06/17/2018   HPI:  Patient presents to follow up on diabetes and hyperglycemia.  Diabetes - yesterday was seen in [facility] clinic by Dr. Lindell Noe and was given 8 units of Lantus due to very elevated sugars in the 400s. Patient tolerated this injection well. Says she slept well overnight and is feeling some better this AM. Does note continued blurry vision for the last several days intermittently but denies any flashes of light. Did not check fasting blood sugar this AM, but here in clinic today it is 391 after eating breakfast.  Sleep apnea - previously diagnosed with sleep apnea and used CPAP, but has not had in some time due to insurance not covering. She would like to be seen again by sleep physician Dr. Halford Chessman but needs a referral.  ROS: See HPI.  [place]: history of type 2 diabetes, hyperlipidemia, hypertension, GERD, chronic constipation  PHYSICAL EXAM: BP 126/60 (BP Location: Right Arm, Patient Position: Sitting, Cuff Size: Normal)   Pulse (!) 54   Temp 98.3 F (36.8 C) (Oral)   Wt 112 lb (50.8 kg)   SpO2 99%   BMI 21.16 kg/m  Gen: no acute distress, pleasant, cooperative, well appearing HEENT: normocephalic, atraumatic, moist mucous membranes. Pupils equal round and reactive to light. No conjunctival injection or perilimbal redness. extraocular movements intact.   Heart: regular rate and rhythm, no murmur Lungs: clear to auscultation bilaterally, normal work of breathing  Neuro: alert, grossly nonfocal, speech normal Ext: No appreciable lower extremity edema bilaterally   ASSESSMENT/PLAN:  Health maintenance:  -reminded patient to get Tdap  Type 2 diabetes mellitus Started on lantus 8 units yesterday, with apparent improvement in blood sugar (391 nonfasting today). Will continue this medication. Patient received extensive education from pharmacy team today regarding how to administer lantus and was given lantus samples. Educated on signs & symptoms of  hypoglycemia. She will follow up with Dr. Graylin Shiver pharmacy team by phone on Friday, and will see me in the office next Tuesday. Patient agreeable to this plan.  OSA (obstructive sleep apnea) Refer back to Dr. Halford Chessman for evaluation, not on CPAP currently.  Blurry vision Likely in setting of hyperglycemia. Reviewed with patient the limitations of assessing any visual related issue in the primary care setting and advised she contact her eye doctor if blurry vision concerns her.  FOLLOW UP: Follow up in 1 week for diabetes  Referring to Dr. Darlin Coco. Ardelia Mems, [place]

## 2018-06-18 NOTE — Telephone Encounter (Signed)
Patient called nurse line. Had trouble giving her first insulin injection this morning because she can't get the needle off the pen. She will take it by her pharmacy or bring here and RN clinic will help her.  Asks for PCP to call; wants to discuss if there is anything she can do instead of insulin. Felt the change to insulin was very quick and she is uneasy. Willing to make any adjustments necessary to avoid insulin.  Call back is [phone]   Danley Danker, RN [facility])

## 2018-06-18 NOTE — Telephone Encounter (Signed)
Attempted to reach patient at number listed, no answer. LVM offering for her to call back. [name], MD

## 2018-06-18 NOTE — Assessment & Plan Note (Signed)
Started on lantus 8 units yesterday, with apparent improvement in blood sugar (391 nonfasting today). Will continue this medication. Patient received extensive education from pharmacy team today regarding how to administer lantus and was given lantus samples. Educated on signs & symptoms of hypoglycemia. She will follow up with Dr. Graylin Shiver pharmacy team by phone on Friday, and will see me in the office next Tuesday. Patient agreeable to this plan.

## 2018-06-20 ENCOUNTER — Telehealth: Payer: Self-pay

## 2018-06-20 ENCOUNTER — Ambulatory Visit: Payer: Medicare HMO | Admitting: Podiatry

## 2018-06-20 NOTE — Telephone Encounter (Signed)
Patient left message at [phone] that she is trying to reach Dr. Valentina Lucks.   Call back is [phone].  Danley Danker, RN [facility] RN)

## 2018-06-20 NOTE — Telephone Encounter (Signed)
Called patient and she reported she had NOT done a shot yesterday or this AM due to anxiety/fear/ confusion of dosing.  She stated she was planning to wait until her next visit on Tuesday (4 days from now) to take her next shot.  (her home readings were 198 yesterday AM and 207 this AM).  Following some discussion, she was willing to try a shot with instruction over the phone.  We discussed step-by-step process over the phone and patient was able to complete shot of 8 units Lantus.    She was relieved and reported she believed she could do a shot correctly each day over the next three days.  She was asked to wait until her appointment on Tuesday to perform her shot in office with observation for coaching and support.  Pharmacy student or resident support should be available at that time (I will unfortunately be out of the office).

## 2018-06-23 ENCOUNTER — Telehealth: Payer: Self-pay | Admitting: Pharmacist

## 2018-06-23 NOTE — Telephone Encounter (Signed)
No answer - attempted to determine if she had taken shots over the weekend and blood sugar control.

## 2018-06-24 ENCOUNTER — Ambulatory Visit: Payer: Medicare HMO | Admitting: Family Medicine

## 2018-06-24 MED ORDER — INSULIN GLARGINE 100 UNIT/ML ~~LOC~~ SOLN: 8.0000 [IU] | Freq: Every day | SUBCUTANEOUS | 0 refills | Status: DC

## 2018-06-24 NOTE — Addendum Note (Signed)
Addended by: Christen Bame D on: 06/24/2018 12:21 PM   Modules accepted: Orders

## 2018-06-24 NOTE — Telephone Encounter (Signed)
Pt returned call.  Her CBGs are as follows:  2/19 - 289 2/20 - 191 2/21 - 206 2/22 - 199 2/23 - 170 2/24 - 176  She is out of her Sample of Lantus and has appt with Dr. Ardelia Mems on 3/2.  One pen of lantus placed in the fridge for pt to pickup. Mykah Shin, Salome Spotted, CMA

## 2018-06-24 NOTE — Telephone Encounter (Signed)
Pt returned call and lm on nurse line.  Attempted to call  Back no answer, lmovm for pt to return call. Mallory Hayes, Salome Spotted, CMA

## 2018-06-25 ENCOUNTER — Ambulatory Visit (INDEPENDENT_AMBULATORY_CARE_PROVIDER_SITE_OTHER): Payer: Medicare HMO | Admitting: Podiatry

## 2018-06-25 ENCOUNTER — Encounter: Payer: Self-pay | Admitting: Podiatry

## 2018-06-25 DIAGNOSIS — E119 Type 2 diabetes mellitus without complications: Secondary | ICD-10-CM

## 2018-06-25 DIAGNOSIS — B351 Tinea unguium: Secondary | ICD-10-CM | POA: Diagnosis not present

## 2018-06-25 DIAGNOSIS — M79676 Pain in unspecified toe(s): Secondary | ICD-10-CM

## 2018-06-25 LAB — HM DIABETES FOOT EXAM

## 2018-06-25 NOTE — Progress Notes (Signed)
Patient ID: Mallory Hayes, female   DOB: November 05, 1945, 73 y.o.   MRN: [id number] Complaint:  Visit Type: Patient returns to my office for continued preventative foot care services. Complaint: Patient states" my nails have grown long and thick and become painful to walk and wear shoes" Patient has been diagnosed with DM with no foot complications. The patient presents for preventative foot care services. No changes to ROS  Podiatric Exam: Vascular: dorsalis pedis and posterior tibial pulses are palpable bilateral. Capillary return is immediate. Temperature gradient is WNL. Skin turgor WNL  Sensorium: Normal Semmes Weinstein monofilament test. Normal tactile sensation bilaterally. Nail Exam: Pt has thick disfigured discolored nails with subungual debris noted bilateral entire nail hallux through fifth toenails Ulcer Exam: There is no evidence of ulcer or pre-ulcerative changes or infection. Orthopedic Exam: Muscle tone and strength are WNL. No limitations in general ROM. No crepitus or effusions noted. HAV  B/L and tailors bunion  B/L Skin: No Porokeratosis. No infection or ulcers  Diagnosis:  Onychomycosis, , Pain in right toe, pain in left toes  Treatment & Plan Procedures and Treatment: Consent by patient was obtained for treatment procedures. The patient understood the discussion of treatment and procedures well. All questions were answered thoroughly reviewed. Debridement of mycotic and hypertrophic toenails, 1 through 5 bilateral and clearing of subungual debris. No ulceration, no infection noted.  Powerstep insole recommended for bony prominence left foot.  Return Visit-Office Procedure: Patient instructed to return to the office for a follow up visit 3 months for continued evaluation and treatment.   Gardiner Barefoot DPM

## 2018-06-25 NOTE — Telephone Encounter (Signed)
BI Cares Patient Assistance Program  form dropped off for at front desk for completion.  Verified that patient section of form has been completed.  Last DOS/WCC with PCP was  Placed form in team folder to be completed by clinical staff.  Mallory Hayes

## 2018-06-25 NOTE — Telephone Encounter (Signed)
Patient called, also needs pen needles. Placed up front for pick up. Patient aware. Danley Danker, RN [facility] RN)

## 2018-06-25 NOTE — Telephone Encounter (Signed)
Reviewed BI cares form and placed in PCP's box for completion.  Ozella Almond, [place]

## 2018-06-26 ENCOUNTER — Encounter: Payer: Self-pay | Admitting: Family Medicine

## 2018-06-26 NOTE — Telephone Encounter (Signed)
I was out of the office yesterday. Called patient just now.   She is fairly adamant that she does not want to take insulin at this time. Felt she was having an "out of body experience" last night. Did not check sugar when it was happening. This morning her sugar was 129.  She prefers to do the oral medications and work hard on her diet to see if she can keep her sugar down. If after that she needs the insulin, she is open to going back on it. Has upcoming appointment with me. We will see how she is doing at that time. Advised her not to throw away any of her insulin supplies as she may yet need to go back on it.  Patient appreciative. FYI to Dr. Valentina Lucks.  [name], MD

## 2018-06-27 NOTE — Telephone Encounter (Signed)
Want to discuss with Dr. Valentina Lucks first, and could not locate him so will hang onto this form until next week when I can speak with him.  [name], MD

## 2018-06-30 ENCOUNTER — Other Ambulatory Visit: Payer: Self-pay

## 2018-06-30 ENCOUNTER — Ambulatory Visit (INDEPENDENT_AMBULATORY_CARE_PROVIDER_SITE_OTHER): Payer: Medicare HMO | Admitting: Family Medicine

## 2018-06-30 ENCOUNTER — Encounter: Payer: Self-pay | Admitting: Family Medicine

## 2018-06-30 VITALS — BP 108/62 | HR 52 | Temp 98.5°F | Wt 111.2 lb

## 2018-06-30 DIAGNOSIS — K219 Gastro-esophageal reflux disease without esophagitis: Secondary | ICD-10-CM

## 2018-06-30 DIAGNOSIS — E1169 Type 2 diabetes mellitus with other specified complication: Secondary | ICD-10-CM

## 2018-06-30 MED ORDER — TETANUS-DIPHTH-ACELL PERTUSSIS 5-2.5-18.5 LF-MCG/0.5 IM SUSP: 0.5000 mL | Freq: Once | INTRAMUSCULAR | 0 refills | Status: AC

## 2018-06-30 NOTE — Assessment & Plan Note (Signed)
Patient motivated to make dietary changes Refer to nutrition & diabetes education center for one on one counseling Discussed she may need to go back on insulin if cannot control sugars with oral medications & diet. Follow up with me in 1 month, sooner if sugars rising.

## 2018-06-30 NOTE — Telephone Encounter (Signed)
Spoke with Dr. Valentina Lucks. It appears patient is active with [facility].   Catie, is this something you can help facilitate? What do I need to do with the forms?  Thanks [name], MD

## 2018-06-30 NOTE — Patient Instructions (Signed)
Referring to diabetes education center  Check sugars every day in the AM If getting higher #s closer to 200s call us Otherwise follow up with me in 1 month after you've worked hard on your diet  Ok to do tums for acid reflux  Bruise will get better with time  Be well, Dr. Ardelia Mems

## 2018-06-30 NOTE — Assessment & Plan Note (Signed)
Advised ok to continue taking tums, follow instructions on over the counter bottle.

## 2018-06-30 NOTE — Progress Notes (Signed)
Date of Visit: 06/30/2018   HPI:  Mallory Hayes presents for follow up of diabetes.   Diabetes - currently taking jardiance 10mg  daily. Tolerating this well. Fasting sugars are running 160s-170s. Stopped taking lantus last wk because did not like how it made her feel. Has worked on her diet - cut out cheetos, ginger ale, cookies which she was eating regularly. Really wants to avoid insulin if possible.  Bruise - hit leg at the bookstore this weekend, has bruise on outer aspect of R leg, wants me to look at it  Acid reflux - noticing more reflux symptoms since stopping ginger ale. Has been taking tums. Would like to keep taking these as theyre helping, just wants to know if its okay to take them.  ROS: See HPI.  [place]: history of constipation, GERD, hypertension, hyperlipidemia, type 2 diabetes depression/anxiety, OSA  PHYSICAL EXAM: BP 108/62   Pulse (!) 52   Temp 98.5 F (36.9 C) (Oral)   Wt 111 lb 3.2 oz (50.4 kg)   SpO2 98%   BMI 21.01 kg/m  Gen: no acute distress, pleasant, cooperative, well appearing HEENT: normocephalic, atraumatic  Lungs: normal work of breathing  Neuro: alert, grossly nonfocal, speech normal Extremities: R lateral thigh with bruise that is approximately 4cm in diameter, no fluctuance, tenderness or warmth.  ASSESSMENT/PLAN:  Health maintenance:  -reminded patient to get Tdap  Type 2 diabetes mellitus Patient motivated to make dietary changes Refer to nutrition & diabetes education center for one on one counseling Discussed she may need to go back on insulin if cannot control sugars with oral medications & diet. Follow up with me in 1 month, sooner if sugars rising.  Esophageal reflux Advised ok to continue taking tums, follow instructions on over the counter bottle.  Bruise No signs of deeper hematoma, advised this will go away with time.  FOLLOW UP: Follow up in 1 mo for diabetes Referring to nutr & diabetes education center  Mallory J.  Ardelia Hayes, [place]

## 2018-07-01 ENCOUNTER — Other Ambulatory Visit: Payer: Self-pay | Admitting: Pharmacist

## 2018-07-01 NOTE — Telephone Encounter (Signed)
Absolutely! I've added myself to her care team.   Depending on income, she may qualify for Jardiance patient assistance through [facility]. This would require that we have 1) the completed form and  2) proof of household income (tax return, W2, social security statements, etc).   I'll call the patient today to discuss the process. If the provider portion of the form could be completed and left in Dr. Graylin Shiver office, I'll swing by [facility] 3/4 or 3/5 to pick up. Will pass all materials along to [employer], CPhT for submission and follow up.   Catie Darnelle Maffucci, PharmD, [place] PGY2 Ambulatory Care Pharmacy Resident, [place] [facility] Phone: [phone]

## 2018-07-01 NOTE — Patient Outreach (Signed)
[facility] [facility]) Care Management  07/01/2018  Mallory Hayes Nov 23, 1945 [id number]  Received message from Dr. Ardelia Mems regarding patient assistance for [place] - patient dropped off the [employer] patient assistance application to be completed.   Attempted to contact patient to discuss the process and required parts of the application; left HIPAA compliant message for her to return my call.   Catie Darnelle Maffucci, PharmD PGY2 Ambulatory Care Pharmacy Resident, [place] Network Phone: [phone]

## 2018-07-02 NOTE — Telephone Encounter (Signed)
Franne Grip signed my section of the form and will place it in Dr. Graylin Shiver office for you to pick up. Will need a current medication list printed with the form; I can't figure out the best way to do this in Epic Thanks! [name], MD

## 2018-07-04 ENCOUNTER — Other Ambulatory Visit: Payer: Self-pay | Admitting: Pharmacy Technician

## 2018-07-04 ENCOUNTER — Other Ambulatory Visit: Payer: Self-pay | Admitting: Pharmacist

## 2018-07-04 NOTE — Patient Outreach (Signed)
[facility] [facility]) Care Management  07/04/2018  Mallory Hayes [birth year] [id number]                           Medication Assistance Referral  Referral From: [facility] RPh Catie T.  Medication/Company: [name] / [employer] Patient application portion:  Education officer, museum portion: Interoffice Mailed to Dr. Chrisandra Netters  Follow up:  Will follow up with patient in 5-10 business days to confirm application(s) have been received.  [name] P. [name], [place] Management [phone]

## 2018-07-04 NOTE — Patient Outreach (Signed)
[facility] [place] [facility]) Care Management  07/04/2018  Mallory Hayes 1945/10/08 [id number]  Received completed Jardiance ([place]) patient assistance application. Contacted patient on her cell number to discuss that BI requires proof of income. She requested that we mail her a letter with a return envelope for her to mail these documents back to [place].   Will contact [employer], CPhT to prepare the letter w/ return envelope and pass the application to her for submission and follow up.   Patient was appreciative of the assistance.   Catie Darnelle Maffucci, PharmD, [place] PGY2 Ambulatory Care Pharmacy Resident, [place] Network Phone: [phone]

## 2018-07-08 NOTE — Telephone Encounter (Signed)
Patient reported her blood sugars have been high 100s and low 200s occasionally.   She reports walking 3-4 days per week for 30 minutes.   She continues to abstain from regular ginger ale (now drinking either water or diet ginger ale). She is also abstaining from Cheetos and sugar cookies.   Encouraged to continue her exercise and diet plan.  Follow-up with Dr. Ardelia Mems is planned for 4/6 per patient report.   She thanked me for the call.

## 2018-07-10 ENCOUNTER — Other Ambulatory Visit: Payer: Self-pay

## 2018-07-10 NOTE — Patient Outreach (Signed)
[facility]) Care Management  07/10/2018  Mallory Hayes 11/08/1945 [id number]    1st unsuccessful attempt to the patient for assessment.  No answer.  HIPAA compliant voicemail left with contact information.  Plan: RN Health Coach will send letter. [place] will make outreach attempt  to the patient within a month.  [name] RN, BSN, [place] Direct Dial:  [phone]  Fax: [phone]

## 2018-07-14 ENCOUNTER — Other Ambulatory Visit: Payer: Self-pay | Admitting: Pharmacy Technician

## 2018-07-14 ENCOUNTER — Telehealth: Payer: Self-pay | Admitting: Pharmacist

## 2018-07-14 NOTE — Patient Outreach (Addendum)
[place] [facility]) Care Management  07/14/2018  Mallory Hayes November 28, 1945 [id number]  Received message from [name], CPhT that per patient's reported income on [place] application, she may qualify for Medicare Extra Help.   Contacted patient, discussed the process. With her permission, completed the Medicare Extra Help application over the phone. Recommended that she watch her mail in the next 3-6 weeks for a determination from the [employer].   Explained to her that denial of Extra Help would likely be required by [employer] for long-term approval. If approved for Extra Help, her copays on all medications will be much more manageable.   Encouraged patient to contact Sharee Pimple or me with any questions while we wait for Extra Help and BI Cares determinations.   Catie Darnelle Maffucci, PharmD, [place] PGY2 Ambulatory Care Pharmacy Resident, [place] Network Phone: [phone]

## 2018-07-14 NOTE — Patient Outreach (Signed)
[place] [facility]) Care Management  07/14/2018  Mallory Hayes [birth year] [id number]   Unsuccessful outreach call placed to patient in regards to [facility] patient assistance for Jardiance.  Unfortunately patient did not answer the phone. HIPAA compliant voicemail left.  Was calling patient to inquire if she has received her patient assistance applications in the mail.  Will followup with 2nd outreach call in 2-3 business days to inquire if application has been received and mailed back.  Mallory Hayes P. [name], [place] Management [phone]

## 2018-07-14 NOTE — Patient Outreach (Signed)
[facility]) Care Management  07/14/2018  Mallory Hayes Sep 21, 1945 [id number]   ADDENDUM  Incoming call received from patient in regards to [facility] application for Jardiance. HIPAA identifiers verified.  Informed patient that her application had been received. Informed patient that I would submit her application to [facility] but that patient may need to submit her 2020 social security statement instead of 2019. Informed patient I would be in touch.  Received all necessary documents and signatures form both patient and provider. Submitted completed application via fax to [facility]. Sent [facility] RPh Catie Darnelle Maffucci an inbasket inquiring if patient needed to apply for LIS based on reported income.  Will followup with BI in 7-10 business days to inquire if a determination has been made.  Bernette Seeman P. Caylyn Tedeschi, [place] Management [phone]

## 2018-07-23 ENCOUNTER — Ambulatory Visit: Payer: Medicare HMO | Admitting: *Deleted

## 2018-07-24 ENCOUNTER — Other Ambulatory Visit: Payer: Self-pay | Admitting: Pharmacy Technician

## 2018-07-24 NOTE — Patient Outreach (Signed)
[place] [facility]) Care Management  07/24/2018  Gwenith Tschida 07-26-1945 [id number]   Care coordination call placed to BI cares in regards to patient's application for Jardiance.  Spoke to [place] who said the patient has to apply for LIS based on reported income (Applied for extra help on 07/14/2018 with [place]). Aldona Bar informed that in the meantime a 90 days supply of the medication was being sent out. The tracking number is  [id number] (as of today the status says "usps currently awaiting package".  Will followup with patient in 10-14 business days to inquire if she has received the medication and/or her LIS determination letter.  Jamylah Marinaccio P. Veta Dambrosia, [place] Management [phone]

## 2018-08-04 ENCOUNTER — Telehealth: Payer: Self-pay

## 2018-08-04 ENCOUNTER — Other Ambulatory Visit: Payer: Self-pay | Admitting: Pharmacy Technician

## 2018-08-04 NOTE — Patient Outreach (Signed)
[name]) Care Management  08/04/2018  Mallory Hayes Aug 12, 1945 [id number]  Successful outreach call placed to patient in regards to [name] patient assistance application for Jardiance.  Spoke to patient, HIPAA identifiers verified. Patient informed she had received a 90 days supply of Jardiance. Patient informed she was using up her current supply of about 15 tablets before starting the 90 days supply she received from [name]. Patient informed she had not received her LIS letter in the mail yet from Social security but would outreach myself or [place] when the letter arrives.  Will followup with patient in 3-5 weeks if call is not returned concerning the LIS letter.  [name] P. Haakon Titsworth, [place] Management [phone]

## 2018-08-04 NOTE — Telephone Encounter (Signed)
Mallory Hayes called nurse line on behalf of patient and recent cancelled apt. Mallory stated that the patient was confused on why her apt with PCP was cancelled and why she wasn't offered another one. I called pt and left VM to have her return my call. I will offer her a telemed visit with pcp if she wants. Otherwise, she will be rescheduled at a later date due to covid.

## 2018-08-05 ENCOUNTER — Ambulatory Visit: Payer: Medicare HMO | Admitting: Family Medicine

## 2018-08-08 ENCOUNTER — Ambulatory Visit: Payer: Self-pay

## 2018-08-12 ENCOUNTER — Other Ambulatory Visit: Payer: Self-pay

## 2018-08-12 NOTE — Patient Outreach (Signed)
[place] [facility]) Care Management  08/12/2018   Marilynne Dupuis 1945-12-28 [id number]  Subjective: Successful outreach to the patient.  HIPAA verified.  The patient states that "I feel pretty good".  The patient denies any pain or falls.  She states that her blood sugars have start to come down.  They are ranging between 175 to 195.  She realizes that it is not where she wants her blood sugars but they are better than what they have been.  The patient states that she has started to check her blood sugars daily.  She states that she was afraid to see the numbers and did not check because her anxiety.  She states that she has learned to deal with the numbers and work on them to improve. She states that she has been more cognizant of the food that she is eating.  Discussed with the patient about making healthier food and fluid choices.  She verbalized understanding.  She states that she is walking.  She is taking her medications as prescribed.  She is unsure when she will have her next appointment for diabetes.  She is waiting for them to call her when it has been rescheduled.  Current Medications:  Current Outpatient Medications  Medication Sig Dispense Refill  . ACCU-CHEK SOFTCLIX LANCETS lancets USE TO CHECK BLOOD SUGAR ONCE DAILY (Patient taking differently: 1 each by Other route daily. Use to check blood sugar once daily) 100 each 12  . Alcohol Swabs (B-D SINGLE USE SWABS REGULAR) PADS Use to check blood sugar once daily 100 each 3  . amLODipine (NORVASC) 10 MG tablet Take by mouth.    Marland Kitchen aspirin 81 MG tablet Take 81 mg by mouth daily.      . Blood Glucose Calibration (ACCU-CHEK AVIVA) SOLN USE AS NEEDED TO CALIBRATE GLUCOMETER (Patient taking differently: See admin instructions. Use as needed to calibrate glucometer) 1 each 0  . Blood Glucose Monitoring Suppl (ACCU-CHEK AVIVA PLUS) w/Device KIT USE TO CHECK BLOOD SUGAR ONCE DAILY (Patient taking differently: See admin instructions. Use to  check blood sugar once daily) 1 kit 0  . Blood Pressure Monitoring (BLOOD PRESSURE CUFF) MISC 1 Device by Does not apply route daily. 1 each 0  . Blood Pressure Monitoring (BLOOD PRESSURE MONITOR AUTOMAT) DEVI Check blood pressure 3 times per week 1 Device 0  . Calcium Carbonate Antacid (TUMS PO) Take by mouth.    . empagliflozin (JARDIANCE) 10 MG TABS tablet     . furosemide (LASIX) 20 MG tablet TAKE 1 TABLET BY MOUTH EVERY DAY    . glucose blood test strip CHECK BLOOD SUGAR ONE TIME DAILY 100 each 12  . lisinopril (PRINIVIL,ZESTRIL) 20 MG tablet TAKE 1 TABLET EVERY DAY    . mometasone (NASONEX) 50 MCG/ACT nasal spray Place 2 sprays into the nose daily as needed.    . Polyethylene Glycol 3350 (PEG 3350) POWD Take by mouth.    . rosuvastatin (CRESTOR) 20 MG tablet Take by mouth.    . vitamin E (E200) 200 UNIT capsule Take by mouth.     No current facility-administered medications for this visit.     Functional Status:  In your present state of health, do you have any difficulty performing the following activities: 01/06/2018  Hearing? N  Vision? N  Difficulty concentrating or making decisions? N  Walking or climbing stairs? N  Dressing or bathing? N  Doing errands, shopping? N  Some recent data might be hidden    Fall/Depression Screening:  Fall Risk  08/12/2018 06/30/2018 06/16/2018  Falls in the past year? 0 0 0  Number falls in past yr: - - -  Injury with Fall? - - -  Risk Factor Category  - - -  Risk for fall due to : - - -  Follow up - - -   PHQ 2/9 Scores 06/30/2018 06/16/2018 05/06/2018 02/10/2018 01/09/2018 10/30/2017 09/20/2017  PHQ - 2 Score 0 0 0 0 0 0 0  PHQ- 9 Score - - - - - - -    Assessment: Patient will continue to benefit from health coach outreach for disease management and support. THN CM Care Plan Problem One     Most Recent Value  THN Long Term Goal   in 60 days the patient will decrease her a1c of 11.6 by 1-2 points  [facility] Long Term Goal Start Date  08/12/18   Interventions for Problem One Long Term Goal  Discussed FBS,  Discussed low carb diet and heealthy food and fluid choices,  Encouraged to continue to check BS daily, Encouraged exercise and medication adherence,  Discussed precautionary measures for covid-19     Plan: [place] will contact patient in the month of June and patient agrees to next outreach.

## 2018-08-19 ENCOUNTER — Ambulatory Visit: Payer: Medicare HMO | Admitting: *Deleted

## 2018-08-20 ENCOUNTER — Other Ambulatory Visit: Payer: Self-pay | Admitting: Family Medicine

## 2018-08-27 ENCOUNTER — Encounter: Payer: Medicare HMO | Attending: Family Medicine | Admitting: *Deleted

## 2018-08-27 ENCOUNTER — Other Ambulatory Visit: Payer: Self-pay

## 2018-08-27 DIAGNOSIS — E1169 Type 2 diabetes mellitus with other specified complication: Secondary | ICD-10-CM | POA: Insufficient documentation

## 2018-08-27 NOTE — Progress Notes (Signed)
I connected with patient on 08/27/2018 at  1405  EDT by telephone for 80 minutes at home and verified that I am speaking with the correct person using two identifiers.  Diabetes Self-Management Education   Visit Type: First/Initial  Appt. Start Time: 1405 Appt. End Time: 1520  08/27/2018  Mallory Hayes, identified by name and date of birth, is a 73 y.o. female with a diagnosis of Diabetes: Type 2.  This was a telephone visit, patient not comfortable coming to our office during Covid 19 situation. She expresses concern over last A1c of 11.6% and states she has cut out the cookies and chips she had been eating before then. Her diet history reveals similar foods eaten every day but the carbohydrate amount is appropriate for her height and weight. She states she is active most days by walking for 45 minutes 4 days a week and gardening for up to 4 hours on the other days.   ASSESSMENT  There were no vitals taken for this visit.  She states she is at a normal weight of about 110 pounds and is 5\' 1"  tall There is no height or weight on file to calculate BMI.  Diabetes Self-Management Education - 08/27/18 1515      Visit Information   Visit Type  First/Initial      Initial Visit   Diabetes Type  Type 2    Are you currently following a meal plan?  No    Are you taking your medications as prescribed?  Yes    Date Diagnosed  2008      Health Coping   How would you rate your overall health?  Good      Psychosocial Assessment   Patient Belief/Attitude about Diabetes  Afraid   very frustrated with high BG in the AM, has not tested the last 2 weeks because depressing   Self-care barriers  None    Other persons present  Patient    Patient Concerns  Nutrition/Meal planning;Glycemic Control    Learning Readiness  Change in progress    How often do you need to have someone help you when you read instructions, pamphlets, or other written materials from your doctor or pharmacy?  1 - Never    What  is the last grade level you completed in school?  college      Complications   Last HgB A1C per patient/outside source  11.6 %    How often do you check your blood sugar?  1-2 times/day   has not been testing the past 2 weeks since the [facility] [place] for Covid 19   Fasting Blood glucose range (mg/dL)  >200;180-200    Number of hypoglycemic episodes per month  0    Have you had a dilated eye exam in the past 12 months?  Yes    Have you had a dental exam in the past 12 months?  Yes    Are you checking your feet?  Yes    How many days per week are you checking your feet?  7      Dietary Intake   Breakfast  banana, regular oatmeal with 1% milk, and 1 egg every day    Lunch  hamburger on bun with either chicken noodle soup (with broth drained and fresh water added back) or boiled beans    Dinner  hamburger on bun again with steamed squash and pinto beans    Snack (evening)  sugar free lemon cookies or 1  Ritz cracker with PNB    Beverage(s)  coffee with Splenda and 1/2 and 1/2 cream, water, diet gingerale      Exercise   Exercise Type  Light (walking / raking leaves)   walks for 40 minutes 4 days a week and gardens for 4 hours @ 2 days a week!   How many days per week to you exercise?  7    How many minutes per day do you exercise?  45    Total minutes per week of exercise  315      Patient Education   Previous Diabetes Education  Yes (please comment)   2008   Disease state   Explored patient's options for treatment of their diabetes;Factors that contribute to the development of diabetes    Nutrition management   Reviewed blood glucose goals for pre and post meals and how to evaluate the patients' food intake on their blood glucose level.;Role of diet in the treatment of diabetes and the relationship between the three main macronutrients and blood glucose level;Carbohydrate counting    Physical activity and exercise   Role of exercise on diabetes management, blood pressure control and  cardiac health.    Medications  Reviewed patients medication for diabetes, action, purpose, timing of dose and side effects.;Other (comment)   discussed possiblity of returning to insulin to supplement current Diabetes medicaiton   Monitoring  Identified appropriate SMBG and/or A1C goals.;Purpose and frequency of SMBG.    Psychosocial adjustment  Worked with patient to identify barriers to care and solutions;Identified and addressed patients feelings and concerns about diabetes      Individualized Goals (developed by patient)   Nutrition  General guidelines for healthy choices and portions discussed    Physical Activity  Exercise 3-5 times per week    Medications  take my medication as prescribed    Monitoring   test blood glucose pre and post meals as discussed      Post-Education Assessment   Patient understands the diabetes disease and treatment process.  Demonstrates understanding / competency    Patient understands incorporating nutritional management into lifestyle.  Demonstrates understanding / competency    Patient undertands incorporating physical activity into lifestyle.  Demonstrates understanding / competency    Patient understands using medications safely.  Demonstrates understanding / competency    Patient understands monitoring blood glucose, interpreting and using results  Demonstrates understanding / competency    Patient understands prevention, detection, and treatment of chronic complications.  Demonstrates understanding / competency    Patient understands how to develop strategies to address psychosocial issues.  Demonstrates understanding / competency      Outcomes   Expected Outcomes  Demonstrated interest in learning. Expect positive outcomes    Future DMSE  PRN    Program Status  Not Completed       Individualized Plan for Diabetes Self-Management Training:   Learning Objective:  Patient will have a greater understanding of diabetes self-management. Patient  education plan is to attend individual and/or group sessions per assessed needs and concerns.   Plan:   Patient Instructions  Plan:  Aim for 3 Carb Choices per meal (45 grams) +/- 1 either way Each serving of Starch, Fruit or Milk = 1 Carb Choice) Aim for 0-2 Carbs per snack if hungry  Include protein in moderation with your meals and snacks Consider reading food labels for Total Carbohydrate of foods Continue with your activity level by walking or gardening daily as tolerated Consider checking BG at  alternate times per day including after meals (2 hours) or exercise occasionally Continue taking medication as directed by MD It may be that your body is not able to make enough insulin for good blood sugar control, so your MD may suggest resuming insulin or other diabetes medication in addition to the Jardiance to get the results you need.   Expected Outcomes:  Demonstrated interest in learning. Expect positive outcomes  Education material mailed to patient due to visit via telephone: A1C conversion sheet, Meal plan card and Carbohydrate counting sheet, Advance Directives handout  If problems or questions, patient to contact team via:  Phone  Future DSME appointment: PRN

## 2018-08-27 NOTE — Patient Instructions (Signed)
Plan:  Aim for 3 Carb Choices per meal (45 grams) +/- 1 either way Each serving of Starch, Fruit or Milk = 1 Carb Choice) Aim for 0-2 Carbs per snack if hungry  Include protein in moderation with your meals and snacks Consider reading food labels for Total Carbohydrate of foods Continue with your activity level by walking or gardening daily as tolerated Consider checking BG at alternate times per day including after meals (2 hours) or exercise occasionally Continue taking medication as directed by MD It may be that your body is not able to make enough insulin for good blood sugar control, so your MD may suggest resuming insulin or other diabetes medication in addition to the Jardiance to get the results you need.

## 2018-09-03 ENCOUNTER — Other Ambulatory Visit: Payer: Self-pay | Admitting: Pharmacy Technician

## 2018-09-03 NOTE — Patient Outreach (Signed)
[place] [facility]) Care Management  09/03/2018  Chelsa Stout [birth year] [id number]   Successful outgoing call placed to patient in regards to [facility] application for Jardiance.  Spoke to patient, HIPAA identifiers verified. Patient confirmed that she received her LIS letter and it stated she was denied "due to making too much in social security". Informed patient that BI would need a copy of that letter in order to continue her enrollment in the patient assistance program. Patient informed she would be able to make a copy of the letter.  Plan: Will mail patient a return envelope to mail back a copy for her denial letter when I am in the office on 09/04/2018. Will followup with patient within 5-7 days of 09/04/2018 to inquire if she received the envelope and in turn mailed back the denial LIS letter.  Brooks Kinnan P. Vinay Ertl, [place] Management [phone]

## 2018-09-04 ENCOUNTER — Encounter: Payer: Self-pay | Admitting: Pharmacy Technician

## 2018-09-12 ENCOUNTER — Other Ambulatory Visit: Payer: Self-pay | Admitting: Pharmacy Technician

## 2018-09-12 NOTE — Patient Outreach (Signed)
[facility]) Care Management  09/12/2018  Elaysha Bevard 24-Oct-1945 [id number]    Unsuccessful outreach call placed to patient in regards to [facility] application for [place].  Unfortunately patient did not answer the phone, HIPAA compliant voicemail left.  Was calling to inquire if patient had received the return envelope mailed to her so she could mail back her denial letter from LIS.  Will followup with patient in 5-7 business days if call is not returned.  Rogelio Waynick P. Anvay Tennis, [place] Management [phone]

## 2018-09-16 ENCOUNTER — Other Ambulatory Visit: Payer: Self-pay | Admitting: Pharmacy Technician

## 2018-09-16 NOTE — Patient Outreach (Signed)
[facility]) Care Management  09/16/2018  Mallory Hayes 1946/01/24 [id number]    Received patient's LIS denial letter in the mail.  Submitted LIS letter to [facility] patient assistance for her Jardiance application in an effort to get patient a full approval.  Will followup with BI in 7-10 business days to inquire on status of application.  [name] P. Saylah Ketner, [place] Management [phone]

## 2018-09-24 ENCOUNTER — Ambulatory Visit: Payer: Medicare HMO | Admitting: Podiatry

## 2018-09-26 ENCOUNTER — Other Ambulatory Visit: Payer: Self-pay | Admitting: Pharmacy Technician

## 2018-09-26 NOTE — Patient Outreach (Signed)
[facility] [facility]) Care Management  09/26/2018  Mallory Hayes 1946/01/21 [id number]   Care coordination call placed to [facility] in regards to patient's application for Jardiance.  Spoke to [facility] who informed they had received the denial LIS letter and had extended patient's enrollment until the end of the calendar year. The dates of approval are 07/24/2018-04/30/2019. Jasmine informed that the time frame for the patient to reorder her medication was due now. She informed she would place the order and the mediation would ship out on September 29, 2018 with an arrival date of 2-3 weeks to the patient's home.  Will followup with patient to notify her of this development.  Anmarie Fukushima P. Cashay Manganelli, [place] Management [phone]

## 2018-09-26 NOTE — Patient Outreach (Signed)
[place] [facility]) Care Management  09/26/2018  Mallory Hayes 1945-10-18 [id number]   ADDENDUM  Unsuccessful outreach call placed to patient in regards to [facility] application for Jardiance.  Unfortunately patient did not answer the phone and no option for voicemail was given.  Was calling patient to update her on the extension of her application to the end of the calendar year for Jardiance and to inform her that they are already processing her next refill.  Will followup with patient in 3-5 business days.  Alyra Patty P. Gyan Cambre, [place] Management [phone]

## 2018-09-29 ENCOUNTER — Other Ambulatory Visit: Payer: Self-pay | Admitting: Pharmacy Technician

## 2018-09-29 NOTE — Patient Outreach (Signed)
[place] [facility]) Care Management  09/29/2018  Mallory Hayes Sep 02, 1945 [id number]    Unsuccessful outreach placed to patient in regards to [facility] application for [place].  Unfortunately patient did not answer the phone, HIPAA compliant voicemail left.  Was calling patient to update her on the BI application extension.  Will followup with patient in 5-7 business days if call is not returned.  Mallory Hayes P. [name], [place] Management [phone]

## 2018-10-08 ENCOUNTER — Other Ambulatory Visit: Payer: Self-pay | Admitting: Pharmacy Technician

## 2018-10-08 DIAGNOSIS — E119 Type 2 diabetes mellitus without complications: Secondary | ICD-10-CM | POA: Diagnosis not present

## 2018-10-08 DIAGNOSIS — H25813 Combined forms of age-related cataract, bilateral: Secondary | ICD-10-CM | POA: Diagnosis not present

## 2018-10-08 DIAGNOSIS — H43811 Vitreous degeneration, right eye: Secondary | ICD-10-CM | POA: Diagnosis not present

## 2018-10-08 LAB — HM DIABETES EYE EXAM

## 2018-10-08 NOTE — Patient Outreach (Signed)
[place] [facility]) Care Management  10/08/2018  Mallory Hayes 1946-04-24 [id number]    Unsuccessful outreach call placed to patient in regards to [facility] application for [place].  Unfortunately this was my  3rd call attempt to the patient and the 3rd time of leaving a message.  Was calling patient to inform her that her enrollment with BI had been extended to the end for the year, 04/30/2019 and to inquire if she had received her second shipment of medication. Discussion was previously held with patient on how to obtain her refills. Left patient contact information on her  voicemail.  Will route note to [place] for case closure as patient assistance has been completed and will remove myself from care team.  Luiz Ochoa. Merwyn Hodapp, [place] Management [phone]

## 2018-10-13 ENCOUNTER — Other Ambulatory Visit: Payer: Self-pay

## 2018-10-13 ENCOUNTER — Encounter: Payer: Self-pay | Admitting: Family Medicine

## 2018-10-13 NOTE — Patient Outreach (Signed)
[place]) Care Management  10/13/2018  Alvie Speltz [birth year] [id number]    1st unsuccessful outreach to the patient for assessment.  No answer.  Both home and mobile numbers were called.  The mobile number has a voicemail that is full and the home number rang six times with no voicemail pick up.  Plan: RN Health Coach will send letter. [place] will make outreach attempt to the patient within thirty business days.  [name] RN, BSN, [place] Direct Dial:  [phone]  Fax: [phone]

## 2018-10-21 ENCOUNTER — Ambulatory Visit (INDEPENDENT_AMBULATORY_CARE_PROVIDER_SITE_OTHER): Payer: Medicare HMO | Admitting: Family Medicine

## 2018-10-21 ENCOUNTER — Encounter: Payer: Self-pay | Admitting: Podiatry

## 2018-10-21 ENCOUNTER — Other Ambulatory Visit: Payer: Self-pay

## 2018-10-21 ENCOUNTER — Ambulatory Visit (INDEPENDENT_AMBULATORY_CARE_PROVIDER_SITE_OTHER): Payer: Medicare HMO | Admitting: Podiatry

## 2018-10-21 VITALS — BP 135/60 | HR 53

## 2018-10-21 DIAGNOSIS — M79674 Pain in right toe(s): Secondary | ICD-10-CM | POA: Diagnosis not present

## 2018-10-21 DIAGNOSIS — B351 Tinea unguium: Secondary | ICD-10-CM

## 2018-10-21 DIAGNOSIS — M79675 Pain in left toe(s): Secondary | ICD-10-CM

## 2018-10-21 DIAGNOSIS — E1169 Type 2 diabetes mellitus with other specified complication: Secondary | ICD-10-CM

## 2018-10-21 DIAGNOSIS — K5909 Other constipation: Secondary | ICD-10-CM

## 2018-10-21 LAB — POCT GLYCOSYLATED HEMOGLOBIN (HGB A1C): HbA1c, POC (controlled diabetic range): 11.1 % — AB (ref 0.0–7.0)

## 2018-10-21 MED ORDER — METFORMIN HCL ER 500 MG PO TB24: 500.0000 mg | ORAL_TABLET | Freq: Two times a day (BID) | ORAL | 3 refills | Status: DC

## 2018-10-21 MED ORDER — TETANUS-DIPHTH-ACELL PERTUSSIS 5-2.5-18.5 LF-MCG/0.5 IM SUSP: 0.5000 mL | Freq: Once | INTRAMUSCULAR | 0 refills | Status: AC

## 2018-10-21 NOTE — Progress Notes (Signed)
Patient ID: Mallory Hayes, female   DOB: 1945/07/10, 73 y.o.   MRN: [id number] Complaint:  Visit Type: Patient returns to my office for continued preventative foot care services. Complaint: Patient states" my nails have grown long and thick and become painful to walk and wear shoes" Patient has been diagnosed with DM with no foot complications. The patient presents for preventative foot care services. No changes to ROS  Podiatric Exam: Vascular: dorsalis pedis and posterior tibial pulses are palpable bilateral. Capillary return is immediate. Temperature gradient is WNL. Skin turgor WNL  Sensorium: Normal Semmes Weinstein monofilament test. Normal tactile sensation bilaterally. Nail Exam: Pt has thick disfigured discolored nails with subungual debris noted bilateral entire nail hallux through fifth toenails Ulcer Exam: There is no evidence of ulcer or pre-ulcerative changes or infection. Orthopedic Exam: Muscle tone and strength are WNL. No limitations in general ROM. No crepitus or effusions noted. HAV  B/L and tailors bunion  B/L Skin: No Porokeratosis. No infection or ulcers  Diagnosis:  Onychomycosis, , Pain in right toe, pain in left toes  Treatment & Plan Procedures and Treatment: Consent by patient was obtained for treatment procedures. The patient understood the discussion of treatment and procedures well. All questions were answered thoroughly reviewed. Debridement of mycotic and hypertrophic toenails, 1 through 5 bilateral and clearing of subungual debris. No ulceration, no infection noted.    Return Visit-Office Procedure: Patient instructed to return to the office for a follow up visit 3 months for continued evaluation and treatment.   Gardiner Barefoot DPM

## 2018-10-21 NOTE — Progress Notes (Signed)
Date of Visit: 10/21/2018   HPI:  Patient presents to follow up on diabetes.  Cardiology follow up  - last saw Dr. Oval Linsey in [place] 2017, wants to go back there. Needs the phone #. No specific symptoms but wants to follow up for her heart. Dr. Oval Linsey wanted her to follow up in 1 year.  Diabetes - currently taking jardiance 10mg  daily. Had stopped metformin back in the fall due to renal issues. Says she saw nephrology in December 2019 and was told she could go back on metformin. Has not checked sugars in a while. Admits to frequently drinking sugary beverages. She is motivated to get diabetes under control but strongly wants to avoid insulin if possible.  Constipation - taking miralax as needed, takes a couple times a week. Has to take this in order to have a bowel movement. No blood in stool. Wants to know how often can take miralax.  ROS: See HPI.  [place]: history of hypertension, hyperlipidemia, type 2 diabetes, GERD, chronic constipation  PHYSICAL EXAM: BP 135/60   Pulse (!) 53   SpO2 99%  Gen: no acute distress, pleasant, cooperative, well appearing HEENT: normocephalic, atraumatic  Lungs: normal work of breathing  Neuro: alert, grossly nonfocal, speech normal  ASSESSMENT/PLAN:  Health maintenance:  -gave rx for Tdap  Type 2 diabetes mellitus Controlled with A1c of 11.1. patient motivated to make changes. On dietary review, she is drinking a LOT of sugary beverages. She may be able to get diabetes under better control just by adjusting her beverage intake. Will also add back metformin. Encouraged her to check sugars regularly. Follow up in 1 month for diabetes   CONSTIPATION, CHRONIC Advised ok to take miralax daily as needed - she can titrate this to how often she needs/wants a bowel movement   Gave phone # for cardiologist office for her to call & schedule  FOLLOW UP: Follow up in 1 month for diabetes   [place] J. Ardelia Mems, [place]

## 2018-10-21 NOTE — Patient Instructions (Signed)
It was great to see you again today!  Ok to take miralax daily if needed  Dr. Blenda Mounts phone #: [phone]  Start metformin XR 500mg  daily for 1 week Then go up to twice daily   Check sugar regularly Follow up with me in 1 month Come fasting, will check labs then   Be well, Dr. Ardelia Mems

## 2018-10-23 NOTE — Assessment & Plan Note (Signed)
Advised ok to take miralax daily as needed - she can titrate this to how often she needs/wants a bowel movement

## 2018-10-23 NOTE — Assessment & Plan Note (Signed)
Controlled with A1c of 11.1. patient motivated to make changes. On dietary review, she is drinking a LOT of sugary beverages. She may be able to get diabetes under better control just by adjusting her beverage intake. Will also add back metformin. Encouraged her to check sugars regularly. Follow up in 1 month for diabetes

## 2018-10-24 ENCOUNTER — Other Ambulatory Visit: Payer: Self-pay

## 2018-10-24 NOTE — Patient Outreach (Signed)
[place] [facility]) Care Management  10/24/2018  Rebbecca Osuna Aug 20, 1945 [id number]    2nd attempt to outreach the patient.  No answer.  HIPAA compliant voicemail left with contact information.  Plan: RN Health Coach will make outreach attempt to the patient within thirty business  Days.  [name] RN, BSN, [place] Direct Dial:  [phone]  Fax: [phone]

## 2018-10-28 ENCOUNTER — Other Ambulatory Visit: Payer: Self-pay | Admitting: *Deleted

## 2018-10-30 MED ORDER — PEG 3350 17 GM/SCOOP PO POWD: ORAL | 1 refills | Status: AC

## 2018-10-30 NOTE — Telephone Encounter (Signed)
2nd request

## 2018-11-17 ENCOUNTER — Ambulatory Visit: Payer: Medicare HMO | Admitting: Nurse Practitioner

## 2018-11-17 ENCOUNTER — Other Ambulatory Visit: Payer: Self-pay | Admitting: Family Medicine

## 2018-11-20 ENCOUNTER — Other Ambulatory Visit: Payer: Self-pay

## 2018-11-20 NOTE — Patient Outreach (Signed)
[facility]) Care Management  11/20/2018  Mallory Hayes Dec 07, 1945 [id number]    3rd attempt to outreach the patient.  No answer.  HIPAA compliant voicemail  Left with contact information.  Plan: RN Health Coach has made several attempts to outreach the patient.  No response to calls and letter in ten business days [place] will proceed with case closure.   [name] RN, BSN, [place] Direct Dial:  [phone]  Fax: [phone]

## 2018-11-24 ENCOUNTER — Ambulatory Visit: Payer: Self-pay

## 2018-11-27 ENCOUNTER — Other Ambulatory Visit: Payer: Self-pay

## 2018-11-27 NOTE — Patient Outreach (Signed)
[facility]) Care Management  11/27/2018  Mallory Hayes 1946-01-26 [id number]    Returning the patient's call.  No answer.  HIPAA compliant voicemail left.  Unfortunately  we have not been able to connect with each other I will wait for the patient to give me a call back.  Plan:  RN Health Coach will wait ten business days  for the patient to return my call.  [name] RN, BSN, [place] Direct Dial:  [phone]  Fax: [phone]

## 2018-11-28 ENCOUNTER — Other Ambulatory Visit: Payer: Self-pay

## 2018-11-28 NOTE — Patient Outreach (Signed)
[facility]) Care Management  11/28/2018   Mallory Hayes 1946/01/17 [id number]  Subjective: Returned the patient's call.  Two patient identifiers given.  The patient states that she feel great and strong.  She denies any pain or falls.  She states that her a1c has come down from 11.6 to 11.1.  She is still trying to make healthy food and fluid choices.  She states that she is working in her yard and walking daily. She states she takes her medications as prescribed.  She said that her physician switched her from [place] back to metformin. She did not express any side effects from the medication so far.  She will follow back up with her physician in August to check her progress.  Current Medications:  Current Outpatient Medications  Medication Sig Dispense Refill  . ACCU-CHEK SOFTCLIX LANCETS lancets USE TO CHECK BLOOD SUGAR ONCE DAILY (Patient taking differently: 1 each by Other route daily. Use to check blood sugar once daily) 100 each 12  . Alcohol Swabs (B-D SINGLE USE SWABS REGULAR) PADS Use to check blood sugar once daily 100 each 3  . amLODipine (NORVASC) 10 MG tablet Take by mouth.    Marland Kitchen aspirin 81 MG tablet Take 81 mg by mouth daily.      . Blood Glucose Calibration (ACCU-CHEK AVIVA) SOLN USE AS NEEDED TO CALIBRATE GLUCOMETER (Patient taking differently: See admin instructions. Use as needed to calibrate glucometer) 1 each 0  . Blood Glucose Monitoring Suppl (ACCU-CHEK AVIVA PLUS) w/Device KIT USE TO CHECK BLOOD SUGAR ONCE DAILY (Patient taking differently: See admin instructions. Use to check blood sugar once daily) 1 kit 0  . Blood Pressure Monitoring (BLOOD PRESSURE CUFF) MISC 1 Device by Does not apply route daily. 1 each 0  . Blood Pressure Monitoring (BLOOD PRESSURE MONITOR AUTOMAT) DEVI Check blood pressure 3 times per week 1 Device 0  . Calcium Carbonate Antacid (TUMS PO) Take by mouth.    . furosemide (LASIX) 20 MG tablet TAKE 1 TABLET BY MOUTH EVERY DAY 90  tablet 0  . glucose blood test strip CHECK BLOOD SUGAR ONE TIME DAILY 100 each 12  . lisinopril (PRINIVIL,ZESTRIL) 20 MG tablet TAKE 1 TABLET EVERY DAY    . metFORMIN (GLUCOPHAGE-XR) 500 MG 24 hr tablet Take 1 tablet (500 mg total) by mouth 2 (two) times a day. 180 tablet 3  . mometasone (NASONEX) 50 MCG/ACT nasal spray Place 2 sprays into the nose daily as needed.    . Polyethylene Glycol 3350 (PEG 3350) 17 GM/SCOOP POWD Take 17g by mouth daily prn 850 g 1  . rosuvastatin (CRESTOR) 20 MG tablet Take by mouth.    . vitamin E (E200) 200 UNIT capsule Take by mouth.    . empagliflozin (JARDIANCE) 10 MG TABS tablet      No current facility-administered medications for this visit.     Functional Status:  In your present state of health, do you have any difficulty performing the following activities: 01/06/2018  Hearing? N  Vision? N  Difficulty concentrating or making decisions? N  Walking or climbing stairs? N  Dressing or bathing? N  Doing errands, shopping? N  Some recent data might be hidden    Fall/Depression Screening: Fall Risk  11/28/2018 08/27/2018 08/12/2018  Falls in the past year? 0 0 0  Number falls in past yr: - - -  Injury with Fall? - - -  Risk Factor Category  - - -  Risk for fall due to : - - -  Follow up - - -   PHQ 2/9 Scores 08/27/2018 06/30/2018 06/16/2018 05/06/2018 02/10/2018 01/09/2018 10/30/2017  PHQ - 2 Score 1 0 0 0 0 0 0  PHQ- 9 Score - - - - - - -    Assessment: Patient will continue to benefit from health coach outreach for disease management and support. [facility] CM Care Plan Problem One     Most Recent Value  [facility] Long Term Goal   in 60 days the patient will decrease her a1c of 11.1 by 1-2 points  [facility] Long Term Goal Start Date  11/28/18  Interventions for Problem One Long Term Goal  Encouraged continued healthy food and fluid choices, encouraged medication adherence, exercise, discussed about relieving stress and how it can affect her health.     Plan: RN Health  Coach will contact patient in the month of September and patient agrees to next outreach.   [name] RN, BSN, [place] Direct Dial:  [phone]  Fax: [phone]

## 2018-12-09 ENCOUNTER — Telehealth: Payer: Self-pay | Admitting: Cardiovascular Disease

## 2018-12-09 NOTE — Telephone Encounter (Signed)
Recall -  lmtcb 12-09-18

## 2018-12-30 DIAGNOSIS — E1122 Type 2 diabetes mellitus with diabetic chronic kidney disease: Secondary | ICD-10-CM | POA: Diagnosis not present

## 2018-12-30 DIAGNOSIS — G4733 Obstructive sleep apnea (adult) (pediatric): Secondary | ICD-10-CM | POA: Diagnosis not present

## 2018-12-30 DIAGNOSIS — E119 Type 2 diabetes mellitus without complications: Secondary | ICD-10-CM | POA: Diagnosis not present

## 2018-12-30 DIAGNOSIS — I129 Hypertensive chronic kidney disease with stage 1 through stage 4 chronic kidney disease, or unspecified chronic kidney disease: Secondary | ICD-10-CM | POA: Diagnosis not present

## 2018-12-30 DIAGNOSIS — N183 Chronic kidney disease, stage 3 (moderate): Secondary | ICD-10-CM | POA: Diagnosis not present

## 2019-01-02 ENCOUNTER — Telehealth: Payer: Self-pay | Admitting: Family Medicine

## 2019-01-02 MED ORDER — METFORMIN HCL ER 500 MG PO TB24: 500.0000 mg | ORAL_TABLET | Freq: Two times a day (BID) | ORAL | 3 refills | Status: DC

## 2019-01-02 NOTE — Telephone Encounter (Signed)
Attempted to call patient to schedule appointment with PCP within the next week per Dr. Owens Shark.   There was no answer and a voice mail was left asking patient to call office to schedule.  Ozella Almond, [place]

## 2019-01-02 NOTE — Telephone Encounter (Signed)
She received a notification in the mail about a recall on her Metformin.  Please call her to advise on what to do next as she doesn't want to take until she hears from [place].  Thanks.  Phone # [phone], or [phone]

## 2019-01-02 NOTE — Telephone Encounter (Addendum)
Called patient regarding below of how certain lots of metformin have been recalled.  I recommend she not take any more of this medication in the bottle from this lot---other lots are still acceptable and safe. This varies by manufacturer.  I sent a new prescription to her pharmacy.  Her pharmacy should have available metformin from a lot from a manufacturer that does not contain it.  I discussed that this is a chemical that can also be found in bacon and other food items. We discussed the relative risk is low.  All questions were answered.  The patient requested a follow-up visit with Dr. Ardelia Mems in the next week or so.  Routing to nursing to schedule follow-up with her primary care physician.  Dorris Singh, MD  Family Medicine Teaching Service

## 2019-01-14 ENCOUNTER — Other Ambulatory Visit: Payer: Self-pay | Admitting: Family Medicine

## 2019-01-15 ENCOUNTER — Other Ambulatory Visit: Payer: Self-pay | Admitting: *Deleted

## 2019-01-16 MED ORDER — ROSUVASTATIN CALCIUM 20 MG PO TABS: 20.0000 mg | ORAL_TABLET | Freq: Every day | ORAL | 0 refills | Status: DC

## 2019-01-19 ENCOUNTER — Other Ambulatory Visit: Payer: Self-pay

## 2019-01-19 ENCOUNTER — Ambulatory Visit: Payer: Medicare HMO | Admitting: Cardiology

## 2019-01-19 ENCOUNTER — Ambulatory Visit (INDEPENDENT_AMBULATORY_CARE_PROVIDER_SITE_OTHER): Payer: Medicare HMO | Admitting: Family Medicine

## 2019-01-19 VITALS — BP 112/70 | HR 51 | Temp 96.7°F | Wt 111.4 lb

## 2019-01-19 DIAGNOSIS — E1169 Type 2 diabetes mellitus with other specified complication: Secondary | ICD-10-CM

## 2019-01-19 DIAGNOSIS — Z23 Encounter for immunization: Secondary | ICD-10-CM

## 2019-01-19 DIAGNOSIS — E7800 Pure hypercholesterolemia, unspecified: Secondary | ICD-10-CM | POA: Diagnosis not present

## 2019-01-19 DIAGNOSIS — E1122 Type 2 diabetes mellitus with diabetic chronic kidney disease: Secondary | ICD-10-CM | POA: Diagnosis not present

## 2019-01-19 LAB — POCT GLYCOSYLATED HEMOGLOBIN (HGB A1C): HbA1c, POC (controlled diabetic range): 9.2 % — AB (ref 0.0–7.0)

## 2019-01-19 MED ORDER — JARDIANCE 10 MG PO TABS: 10.0000 mg | ORAL_TABLET | Freq: Every day | ORAL | 1 refills | Status: DC

## 2019-01-19 MED ORDER — TETANUS-DIPHTH-ACELL PERTUSSIS 5-2.5-18.5 LF-MCG/0.5 IM SUSP: 0.5000 mL | Freq: Once | INTRAMUSCULAR | 0 refills | Status: AC

## 2019-01-19 MED ORDER — ROSUVASTATIN CALCIUM 20 MG PO TABS: 20.0000 mg | ORAL_TABLET | Freq: Every day | ORAL | 1 refills | Status: DC

## 2019-01-19 NOTE — Assessment & Plan Note (Signed)
A1c greatly improved from 11.1 to 9.2, only on metformin. Add back jardiance 10mg  daily since this was mistakenly stopped by patient after last visit. Follow up in 3 months for next A1c.

## 2019-01-19 NOTE — Patient Instructions (Signed)
It was great to see you again today!  Go back on jardiance, can take this with metformin Refilled crestor Get your tetanus shot Follow up in 3 months  On your way out, schedule an appointment one morning to come back for fasting labs. Do not eat or drink anything other than water the morning of your lab appointment until after your labs are drawn.   Be well, Dr. Ardelia Mems

## 2019-01-19 NOTE — Assessment & Plan Note (Signed)
Return for fasting labs, will titrate statin as needed

## 2019-01-19 NOTE — Progress Notes (Signed)
Date of Visit: 01/19/2019   HPI:  Mallory Hayes presents for follow up.   Diabetes - currently taking metformin XR 500mg  twice daily. Was supposed to be taking this with jardiance 10mg  daily, but patient misunderstood and thought we were changing out the metformin for jardiance. Does not check sugars. Has been working on eating healthier. Feeling well overall.  Hyperlipidemia - taking crestor 20mg  daily. Needs refill. Due for lipids but is not fasting today.  CKD - reports she was recently seen at nephrologist for her CKD and was advised all labs were good. Plans to follow up in 1 year. Did not have lipids drawn.  Belly button lint - has lint in her umbilicus that she would like help removing. No pain or drainage. Just wants help removing the lint.  Desires flu shot today Has not yet gotten Tdap, needs a new rx  ROS: See HPI.  [place]: history of diabetes, hypertension, hyperlipidemia, GERD, chronic constipation  PHYSICAL EXAM: BP 112/70   Pulse (!) 51   Temp (!) 96.7 F (35.9 C) (Axillary)   Wt 111 lb 6.4 oz (50.5 kg)   SpO2 99%   BMI 21.05 kg/m  Gen: no acute distress, pleasant, cooperative HEENT: normocephalic, atraumatic  Heart: regular rate and rhythm, no murmur Lungs: clear to auscultation bilaterally, normal work of breathing  Neuro: alert, speech normal Skin: small amount of black thick material in umbilicus, easily removed with ear curette without complication  ASSESSMENT/PLAN:  Health maintenance:  -flu shot given today -gave rx for Tdap and advised patient to get it at her pharmacy  Type 2 diabetes mellitus A1c greatly improved from 11.1 to 9.2, only on metformin. Add back jardiance 10mg  daily since this was mistakenly stopped by patient after last visit. Follow up in 3 months for next A1c.  HYPERCHOLESTEROLEMIA Return for fasting labs, will titrate statin as needed   CKD - following regularly with nephrology  Umbilicus lint removed without difficulty. Follow up  as needed   FOLLOW UP: Follow up in 3 mos for A1c  [place] J. Ardelia Mems, [place]

## 2019-01-20 ENCOUNTER — Other Ambulatory Visit: Payer: Self-pay

## 2019-01-21 MED ORDER — FUROSEMIDE 20 MG PO TABS: 20.0000 mg | ORAL_TABLET | Freq: Every day | ORAL | 0 refills | Status: DC

## 2019-01-27 ENCOUNTER — Other Ambulatory Visit: Payer: Self-pay

## 2019-01-27 ENCOUNTER — Ambulatory Visit: Payer: Medicare HMO | Admitting: Podiatry

## 2019-01-27 NOTE — Patient Outreach (Signed)
[facility]) Care Management  01/27/2019  Mallory Hayes May 04, 1945 [id number]  1st unsuccessful outreach to the patient for assessment.  No answer.  HIPAA compliant voicemail left with contact information.  Plan: RN Health Coach will send letter. RN Health Coach will make outreach attempt to the patient within thirty business days.  [name] RN, BSN, [place] Direct Dial:  [phone]  Fax: [phone]

## 2019-02-02 ENCOUNTER — Other Ambulatory Visit: Payer: Self-pay

## 2019-02-02 NOTE — Patient Outreach (Signed)
[facility]) Care Management  02/02/2019  Mallory Hayes 1946/04/04 [id number]    Returning the patient's call.  2nd outreach attempt.  No answer.  HIPAA compliant voicemail left with contact information.  Plan: RN Health Coach will make outreach attempt to the patient within thirty business  Days.  [name] RN, BSN, [place] Direct Dial:  [phone]  Fax: [phone]

## 2019-02-03 ENCOUNTER — Ambulatory Visit (INDEPENDENT_AMBULATORY_CARE_PROVIDER_SITE_OTHER): Payer: Medicare HMO | Admitting: Cardiology

## 2019-02-03 ENCOUNTER — Encounter: Payer: Self-pay | Admitting: Cardiology

## 2019-02-03 ENCOUNTER — Other Ambulatory Visit: Payer: Self-pay

## 2019-02-03 VITALS — BP 134/67 | HR 50 | Temp 97.1°F | Ht 61.0 in | Wt 107.0 lb

## 2019-02-03 DIAGNOSIS — E1169 Type 2 diabetes mellitus with other specified complication: Secondary | ICD-10-CM

## 2019-02-03 DIAGNOSIS — N183 Chronic kidney disease, stage 3 unspecified: Secondary | ICD-10-CM

## 2019-02-03 DIAGNOSIS — E785 Hyperlipidemia, unspecified: Secondary | ICD-10-CM

## 2019-02-03 DIAGNOSIS — I119 Hypertensive heart disease without heart failure: Secondary | ICD-10-CM | POA: Diagnosis not present

## 2019-02-03 DIAGNOSIS — I350 Nonrheumatic aortic (valve) stenosis: Secondary | ICD-10-CM | POA: Diagnosis not present

## 2019-02-03 DIAGNOSIS — G4733 Obstructive sleep apnea (adult) (pediatric): Secondary | ICD-10-CM | POA: Diagnosis not present

## 2019-02-03 NOTE — Assessment & Plan Note (Signed)
Echo Oct 2019- normal LVF, grade 2 DD-asymptomatic

## 2019-02-03 NOTE — Patient Instructions (Signed)
Medication Instructions:  Your physician recommends that you continue on your current medications as directed. Please refer to the Current Medication list given to you today. If you need a refill on your cardiac medications before your next appointment, please call your pharmacy.   Lab work: NONE  If you have labs (blood work) drawn today and your tests are completely normal, you will receive your results only by: Marland Kitchen MyChart Message (if you have MyChart) OR . A paper copy in the mail If you have any lab test that is abnormal or we need to change your treatment, we will call you to review the results.  Testing/Procedures: NONE   Follow-Up: At [facility], you and your health needs are our priority.  As part of our continuing mission to provide you with exceptional heart care, we have created designated Provider Care Teams.  These Care Teams include your primary Cardiologist (physician) and Advanced Practice Providers (APPs -  Physician Assistants and Nurse Practitioners) who all work together to provide you with the care you need, when you need it. You will need a follow up appointment in 12 months.  Please call our office 2 months in advance to schedule this appointment.  You may see Skeet Latch, MD or one of the following Advanced Practice Providers on your designated Care Team:   Kerin Ransom, PA-C Roby Lofts, [place] . Sande Rives, PA-C  Any Other Special Instructions Will Be Listed Below (If Applicable).

## 2019-02-03 NOTE — Assessment & Plan Note (Signed)
Statin rx per PCP 

## 2019-02-03 NOTE — Assessment & Plan Note (Signed)
Followed by PCP

## 2019-02-03 NOTE — Assessment & Plan Note (Signed)
Mild AS by echo Oct 2019

## 2019-02-03 NOTE — Assessment & Plan Note (Signed)
C-pap intolerant 

## 2019-02-03 NOTE — Progress Notes (Signed)
Cardiology Office Note:    Date:  02/03/2019   ID:  Mallory Hayes, DOB 05-25-1945, MRN [id number]  PCP:  Mallory Rio, MD  Cardiologist:  Skeet Latch, MD  Electrophysiologist:  None   Referring MD: Mallory Rio, MD   No chief complaint on file. Pt was called and told to come in for a visit.  History of Present Illness:    Mallory Hayes is a pleasant 73 y.o.AA female with a hx of NIDDM, HTN, HLD, sleep apnea (unable to tolerate C-pap) and CRI-3.  She was last seen by Dr Oval Linsey Sept 2017.  The patient says she has done well since.  She denies chest pain or unusual dyspnea.  Her echo Oct 2017 showed normal LVF with grade 1 DD (no AS noted).  Echo done 02/03/2018 showed EF 60-65% with grade 2 DD and mild AS.    The patient walks daily for exercise.  As noted above she has not had any symptoms to suggest CHF or angina.  She does not wear her C-pap but is scheduled to be evaluated for an oral device.   Past Medical History:  Diagnosis Date  . GERD (gastroesophageal reflux disease)   . OSA (obstructive sleep apnea)   . Sleep apnea     Past Surgical History:  Procedure Laterality Date  . APPENDECTOMY    . BREAST EXCISIONAL BIOPSY Right    benign  . CHOLECYSTECTOMY    . COLONOSCOPY  2011  . PARTIAL HYSTERECTOMY  1991    Current Medications: Current Meds  Medication Sig  . ACCU-CHEK SOFTCLIX LANCETS lancets USE TO CHECK BLOOD SUGAR ONCE DAILY (Patient taking differently: 1 each by Other route daily. Use to check blood sugar once daily)  . Alcohol Swabs (B-D SINGLE USE SWABS REGULAR) PADS Use to check blood sugar once daily  . amLODipine (NORVASC) 10 MG tablet Take by mouth.  Marland Kitchen aspirin 81 MG tablet Take 81 mg by mouth daily.    . Blood Glucose Calibration (ACCU-CHEK AVIVA) SOLN USE AS NEEDED TO CALIBRATE GLUCOMETER (Patient taking differently: See admin instructions. Use as needed to calibrate glucometer)  . Blood Glucose Monitoring Suppl (ACCU-CHEK AVIVA PLUS)  w/Device KIT USE TO CHECK BLOOD SUGAR ONCE DAILY (Patient taking differently: See admin instructions. Use to check blood sugar once daily)  . Blood Pressure Monitoring (BLOOD PRESSURE CUFF) MISC 1 Device by Does not apply route daily.  . Blood Pressure Monitoring (BLOOD PRESSURE MONITOR AUTOMAT) DEVI Check blood pressure 3 times per week  . Calcium Carbonate Antacid (TUMS PO) Take by mouth.  . empagliflozin (JARDIANCE) 10 MG TABS tablet Take 10 mg by mouth daily.  . furosemide (LASIX) 20 MG tablet Take 1 tablet (20 mg total) by mouth daily.  Marland Kitchen glucose blood test strip CHECK BLOOD SUGAR ONE TIME DAILY  . lisinopril (ZESTRIL) 20 MG tablet TAKE 1 TABLET EVERY DAY  . metFORMIN (GLUCOPHAGE-XR) 500 MG 24 hr tablet Take 1 tablet (500 mg total) by mouth 2 (two) times daily.  . mometasone (NASONEX) 50 MCG/ACT nasal spray Place 2 sprays into the nose daily as needed.  . Polyethylene Glycol 3350 (PEG 3350) 17 GM/SCOOP POWD Take 17g by mouth daily prn  . rosuvastatin (CRESTOR) 20 MG tablet Take 1 tablet (20 mg total) by mouth daily.  . vitamin E (E200) 200 UNIT capsule Take by mouth.     Allergies:   Amaryl [glimepiride], Food, and Sulfamethoxazole   Social History   Socioeconomic History  . Marital status: Single  Spouse name: Not on file  . Number of children: Not on file  . Years of education: Not on file  . Highest education level: Not on file  Occupational History  . Not on file  Social Needs  . Financial resource strain: Not on file  . Food insecurity    Worry: Not on file    Inability: Not on file  . Transportation needs    Medical: Not on file    Non-medical: Not on file  Tobacco Use  . Smoking status: Never Smoker  . Smokeless tobacco: Never Used  Substance and Sexual Activity  . Alcohol use: No  . Drug use: No  . Sexual activity: Yes    Birth control/protection: None  Lifestyle  . Physical activity    Days per week: Not on file    Minutes per session: Not on file  .  Stress: Not on file  Relationships  . Social Herbalist on phone: Not on file    Gets together: Not on file    Attends religious service: Not on file    Active member of club or organization: Not on file    Attends meetings of clubs or organizations: Not on file    Relationship status: Not on file  Other Topics Concern  . Not on file  Social History Narrative   Pt's 23 yo mother died August 11, 2010 of breast cancer. Pt was very close with mother.  Has 2 siblings that live out of town, but has some cousins and friends in the [place] area.   Retired, never married   No h/o tob, EToH, or drug use   Enjoys walking, reading, dining out, and [employer]     Family History: The patient's family history includes Cancer in her father and mother; Colon cancer (age of onset: 66) in her mother; Diabetes in her brother; Diabetes (age of onset: 74) in her mother; Hyperlipidemia in her mother; Hypertension in her mother; Ovarian cancer in her mother; Prostatitis in her brother.  ROS:   Please see the history of present illness.     All other systems reviewed and are negative.  EKGs/Labs/Other Studies Reviewed:    The following studies were reviewed today: Echo 2019  EKG:  EKG is ordered today.  The ekg ordered today demonstrates NSR, SB 50 (chronic)  Recent Labs: 06/13/2018: BUN 32; Creatinine, Ser 1.35; Hemoglobin 14.0; Platelets 159; Potassium 4.1; Sodium 134  Recent Lipid Panel    Component Value Date/Time   CHOL 146 07/04/2017 1145   TRIG 77 07/04/2017 1145   HDL 70 07/04/2017 1145   CHOLHDL 2.1 07/04/2017 1145   CHOLHDL 1.8 12/02/2014 0952   VLDL 12 12/02/2014 0952   LDLCALC 61 07/04/2017 1145   LDLDIRECT 62 07/28/2012 0927    Physical Exam:    VS:  BP 134/67   Pulse (!) 50   Temp (!) 97.1 F (36.2 C)   Ht _0  (1.549 m)   Wt 107 lb (48.5 kg)   SpO2 99%   BMI 20.22 kg/m     Wt Readings from Last 3 Encounters:  02/03/19 107 lb (48.5 kg)  01/19/19 111 lb 6.4 oz  (50.5 kg)  06/30/18 111 lb 3.2 oz (50.4 kg)     GEN:  Well nourished, thin AA female,well developed in no acute distress HEENT: Normal NECK: No JVD; No carotid bruits LYMPHATICS: No lymphadenopathy CARDIAC: RRR, soft systolic murmur AOV, no rubs, gallops RESPIRATORY:  Clear to auscultation without  rales, wheezing or rhonchi  ABDOMEN: Soft, non-tender, non-distended MUSCULOSKELETAL:  No edema; No deformity  SKIN: Warm and dry NEUROLOGIC:  Alert and oriented x 3 PSYCHIATRIC:  Normal affect   ASSESSMENT:    Hypertensive cardiovascular disease Echo Oct 2019- normal LVF, grade 2 DD-asymptomatic  Aortic stenosis, mild Mild AS by echo Oct 2019  Dyslipidemia Statin rx per PCP  OSA (obstructive sleep apnea) C-pap intolerant   Type 2 diabetes mellitus Followed by PCP  PLAN:    Same Rx- f/u dr Oval Linsey in one year.  She knows to monitor her B/P and I asked her not to push fluids.    Medication Adjustments/Labs and Tests Ordered: Current medicines are reviewed at length with the patient today.  Concerns regarding medicines are outlined above.  Orders Placed This Encounter  Procedures  . EKG 12-Lead   No orders of the defined types were placed in this encounter.   Patient Instructions  Medication Instructions:  Your physician recommends that you continue on your current medications as directed. Please refer to the Current Medication list given to you today. If you need a refill on your cardiac medications before your next appointment, please call your pharmacy.   Lab work: NONE  If you have labs (blood work) drawn today and your tests are completely normal, you will receive your results only by: Marland Kitchen MyChart Message (if you have MyChart) OR . A paper copy in the mail If you have any lab test that is abnormal or we need to change your treatment, we will call you to review the results.  Testing/Procedures: NONE   Follow-Up: At [facility], you and your health needs are  our priority.  As part of our continuing mission to provide you with exceptional heart care, we have created designated Provider Care Teams.  These Care Teams include your primary Cardiologist (physician) and Advanced Practice Providers (APPs -  Physician Assistants and Nurse Practitioners) who all work together to provide you with the care you need, when you need it. You will need a follow up appointment in 12 months.  Please call our office 2 months in advance to schedule this appointment.  You may see Skeet Latch, MD or one of the following Advanced Practice Providers on your designated Care Team:   Kerin Ransom, PA-C Roby Lofts, [place] . Sande Rives, PA-C  Any Other Special Instructions Will Be Listed Below (If Applicable).      Signed, Kerin Ransom, PA-C  02/03/2019 2:34 PM    [facility]

## 2019-02-05 ENCOUNTER — Encounter: Payer: Self-pay | Admitting: Gastroenterology

## 2019-02-17 ENCOUNTER — Ambulatory Visit: Payer: Medicare HMO | Admitting: Podiatry

## 2019-02-17 ENCOUNTER — Encounter: Payer: Self-pay | Admitting: Podiatry

## 2019-02-17 ENCOUNTER — Other Ambulatory Visit: Payer: Self-pay

## 2019-02-17 DIAGNOSIS — M79675 Pain in left toe(s): Secondary | ICD-10-CM | POA: Diagnosis not present

## 2019-02-17 DIAGNOSIS — B351 Tinea unguium: Secondary | ICD-10-CM

## 2019-02-17 DIAGNOSIS — M79674 Pain in right toe(s): Secondary | ICD-10-CM | POA: Diagnosis not present

## 2019-02-17 DIAGNOSIS — E1169 Type 2 diabetes mellitus with other specified complication: Secondary | ICD-10-CM

## 2019-02-17 LAB — HM DIABETES FOOT EXAM

## 2019-02-17 NOTE — Progress Notes (Signed)
Patient ID: Mallory Hayes, female   DOB: 1945-09-16, 73 y.o.   MRN: [id number] Complaint:  Visit Type: Patient returns to my office for continued preventative foot care services. Complaint: Patient states" my nails have grown long and thick and become painful to walk and wear shoes" Patient has been diagnosed with DM with no foot complications. The patient presents for preventative foot care services. No changes to ROS  Podiatric Exam: Vascular: dorsalis pedis and posterior tibial pulses are palpable bilateral. Capillary return is immediate. Temperature gradient is WNL. Skin turgor WNL  Sensorium: Normal Semmes Weinstein monofilament test. Normal tactile sensation bilaterally. Nail Exam: Pt has thick disfigured discolored nails with subungual debris noted bilateral entire nail hallux through fifth toenails Ulcer Exam: There is no evidence of ulcer or pre-ulcerative changes or infection. Orthopedic Exam: Muscle tone and strength are WNL. No limitations in general ROM. No crepitus or effusions noted. HAV  B/L and tailors bunion  B/L Skin: No Porokeratosis. No infection or ulcers  Diagnosis:  Onychomycosis, , Pain in right toe, pain in left toes  Treatment & Plan Procedures and Treatment: Consent by patient was obtained for treatment procedures. The patient understood the discussion of treatment and procedures well. All questions were answered thoroughly reviewed. Debridement of mycotic and hypertrophic toenails, 1 through 5 bilateral and clearing of subungual debris. No ulceration, no infection noted.    Return Visit-Office Procedure: Patient instructed to return to the office for a follow up visit 3 months for continued evaluation and treatment.   Gardiner Barefoot DPM

## 2019-02-18 ENCOUNTER — Other Ambulatory Visit: Payer: Self-pay

## 2019-02-18 NOTE — Patient Outreach (Signed)
[place]) Care Management  02/18/2019   Mallory Hayes 1945-10-14 [id number]  Subjective: Received call from the patient this morning.  Two patient identifiers obtained.  The patient states that she has been doing well.  She was pleased to let me know that she has reduced her a1c from 11.1 to 9.2.  I congratulated her on her efforts.  She was unable to tell me her FBS reading this morning because she had not checked it yet.  Reminded her to make sure to check at least once daily.  She verbalized understanding. She continues to monitor her food intake.  She reports eating sugar free cookies and candy when she has a sweet tooth.  She walks daily. The patient states that she received her flu shot three weeks ago.  She went to her heart doctor two weeks ago and was given a good report.  She states the only thing that she needs to do is have her mammogram.  The patient was notified that she I will be transitioning to a new position and one of my colleagues will be following up with her at the next scheduled phone outreach.   Current Medications:  Current Outpatient Medications  Medication Sig Dispense Refill  . ACCU-CHEK SOFTCLIX LANCETS lancets USE TO CHECK BLOOD SUGAR ONCE DAILY (Patient taking differently: 1 each by Other route daily. Use to check blood sugar once daily) 100 each 12  . Alcohol Swabs (B-D SINGLE USE SWABS REGULAR) PADS Use to check blood sugar once daily 100 each 3  . amLODipine (NORVASC) 10 MG tablet Take by mouth.    . Blood Glucose Calibration (ACCU-CHEK AVIVA) SOLN USE AS NEEDED TO CALIBRATE GLUCOMETER (Patient taking differently: See admin instructions. Use as needed to calibrate glucometer) 1 each 0  . Blood Glucose Monitoring Suppl (ACCU-CHEK AVIVA PLUS) w/Device KIT USE TO CHECK BLOOD SUGAR ONCE DAILY (Patient taking differently: See admin instructions. Use to check blood sugar once daily) 1 kit 0  . Blood Pressure Monitoring (BLOOD PRESSURE CUFF) MISC 1  Device by Does not apply route daily. 1 each 0  . Blood Pressure Monitoring (BLOOD PRESSURE MONITOR AUTOMAT) DEVI Check blood pressure 3 times per week 1 Device 0  . Calcium Carbonate Antacid (TUMS PO) Take by mouth.    . empagliflozin (JARDIANCE) 10 MG TABS tablet Take 10 mg by mouth daily. 90 tablet 1  . furosemide (LASIX) 20 MG tablet Take 1 tablet (20 mg total) by mouth daily. 90 tablet 0  . glucose blood test strip CHECK BLOOD SUGAR ONE TIME DAILY 100 each 12  . lisinopril (ZESTRIL) 20 MG tablet TAKE 1 TABLET EVERY DAY 90 tablet 1  . metFORMIN (GLUCOPHAGE-XR) 500 MG 24 hr tablet Take 1 tablet (500 mg total) by mouth 2 (two) times daily. 180 tablet 3  . mometasone (NASONEX) 50 MCG/ACT nasal spray Place 2 sprays into the nose daily as needed.    . Polyethylene Glycol 3350 (PEG 3350) 17 GM/SCOOP POWD Take 17g by mouth daily prn 850 g 1  . rosuvastatin (CRESTOR) 20 MG tablet Take 1 tablet (20 mg total) by mouth daily. 90 tablet 1  . vitamin E (E200) 200 UNIT capsule Take by mouth.    Marland Kitchen aspirin 81 MG tablet Take 81 mg by mouth daily.       No current facility-administered medications for this visit.     Functional Status:  No flowsheet data found.  Fall/Depression Screening: Fall Risk  02/18/2019 11/28/2018 08/27/2018  Falls in  the past year? 0 0 0  Number falls in past yr: - - -  Injury with Fall? - - -  Risk Factor Category  - - -  Risk for fall due to : - - -  Follow up - - -   PHQ 2/9 Scores 08/27/2018 06/30/2018 06/16/2018 05/06/2018 02/10/2018 01/09/2018 10/30/2017  PHQ - 2 Score 1 0 0 0 0 0 0  PHQ- 9 Score - - - - - - -    Assessment: Patient will continue to benefit from health coach outreach for disease management and support. [facility] CM Care Plan Problem One     Most Recent Value  [facility] Long Term Goal   in 60 days the patient will decrease her a1c of 9.2 by 1-2 points  [facility] Long Term Goal Start Date  02/18/19  Interventions for Problem One Long Term Goal  Talked with the patient about  her food intake, encouraged her to continue to be carb smart and watching eating sweets, encouraged exercise, encouraged her to check her blood sugars daily     Plan: [place] will contact patient in the month of December and patient agrees to next outreach.   [name] RN, BSN, [place] Direct Dial:  [phone]  Fax: [phone]

## 2019-02-20 ENCOUNTER — Other Ambulatory Visit: Payer: Self-pay

## 2019-02-20 ENCOUNTER — Encounter: Payer: Self-pay | Admitting: Family Medicine

## 2019-02-20 ENCOUNTER — Telehealth: Payer: Self-pay | Admitting: Family Medicine

## 2019-02-20 NOTE — Telephone Encounter (Signed)
Would like Rx to go to [facility]

## 2019-02-20 NOTE — Telephone Encounter (Signed)
Pt would like Rx to to to [facility]. Pt can be reached at [phone]. Ottis Stain, CMA

## 2019-02-20 NOTE — Telephone Encounter (Signed)
Patient came into office to drop off form for her PCP concerning Patient Assistant Program about her medications to be renewed. Please give pt a call back. I placed forms in red team folder, and pt's last in office visit was on 01-19-2019.

## 2019-02-20 NOTE — Telephone Encounter (Signed)
I don't see that we have ever prescribed that medication for her. Red team can you contact her to confirm details? Is this the medicine she is actually requesting? Thanks [name], MD

## 2019-02-21 ENCOUNTER — Other Ambulatory Visit: Payer: Self-pay | Admitting: Family Medicine

## 2019-02-23 MED ORDER — MOMETASONE FUROATE 50 MCG/ACT NA SUSP: 2.0000 | Freq: Every day | NASAL | 12 refills | Status: DC

## 2019-02-23 NOTE — Telephone Encounter (Signed)
Patient states that she has not refilled her Nasonex 50 mg (mometasone) in a number of years.  She just recently noticed that her bottle is empty.  She wants to refilled.  Patient is not sure what the other med is and I explained to her that it was a nasal spray as well.  Patient states that she does not care what it is as long as she gets a nasal spray.   Mallory Hayes, [place]

## 2019-02-23 NOTE — Telephone Encounter (Signed)
Reviewed form and placed in PCP's box for completion.  .Mallory Hayes, CMA  

## 2019-02-26 ENCOUNTER — Ambulatory Visit: Payer: Self-pay

## 2019-02-27 ENCOUNTER — Ambulatory Visit (INDEPENDENT_AMBULATORY_CARE_PROVIDER_SITE_OTHER): Payer: Medicare HMO | Admitting: Family Medicine

## 2019-02-27 ENCOUNTER — Other Ambulatory Visit: Payer: Self-pay

## 2019-02-27 ENCOUNTER — Telehealth: Payer: Self-pay

## 2019-02-27 VITALS — BP 122/75 | HR 47 | Wt 108.6 lb

## 2019-02-27 DIAGNOSIS — R630 Anorexia: Secondary | ICD-10-CM | POA: Diagnosis not present

## 2019-02-27 DIAGNOSIS — M25532 Pain in left wrist: Secondary | ICD-10-CM

## 2019-02-27 DIAGNOSIS — R52 Pain, unspecified: Secondary | ICD-10-CM

## 2019-02-27 MED ORDER — WRIST SPLINT/COCK-UP/LEFT M MISC: 0 refills | Status: DC

## 2019-02-27 NOTE — Patient Instructions (Signed)
Wear wrist splint Checking labs Take miralax daily to have bowel movement daily  Call if not doing better in 2 wks, sooner if worsening  Be well, Dr. Ardelia Mems

## 2019-02-27 NOTE — Progress Notes (Signed)
Date of Visit: 02/27/2019   HPI:  Patient presents for a same day appointment to discuss multiple issues.  For the past 2 weeks has generally felt different/not well. Has had a bad taste in her mouth for a few weeks. Also has decreased appetite. Has felt warm but not had any fevers. Warm feeling happens when she is active doing something, and resolves when she stops. She takes miralax as needed for constipation, takes about every 3 days, with bowel movement happening only after taking miralax. No blood in stool. No nausea or vomiting. Recently saw cardiology and was told to follow up in 1 year.  Woke up yesterday with pain in her L lateral thigh. Normally does stretches every morning but not able to do her regular activities as easily because of pain. No injuries that she knows of.  Also has had pain in her L wrist and hand this week. No injuries. It's just sore.  Sugars are reportedly fairly good - running 150s in the AM.  ROS: See HPI  [place]: history of type 2 diabetes, hypertension, hyperlipidemia, GERD, constipation, OSA, CKD3  PHYSICAL EXAM: BP 122/75   Pulse (!) 47   Wt 108 lb 9.6 oz (49.3 kg)   SpO2 99%   BMI 20.52 kg/m  Gen: no acute distress, pleasant, cooperative HEENT: normocephalic, atraumatic, moist mucous membranes, oropharynx clear, tongue normal in appearance, no lymphadenopathy  Heart: regular rhythm, slower rate (consistent with baseline for patient), no murmurs Lungs: clear to auscultation bilaterally, normal work of breathing  Abdomen: soft, nontender to palpation, normoactive bowel sounds  Neuro: alert, speech normal Extremities: L wrist without effusion, warmth, or appreciable tenderness, grip 5/5 on L L thigh with no skin changes or discoloration. Mild tenderness of musculature. No swelling or masses. Full strenght with hip flexion/abduction/adduction and knee flexion/extension.  ASSESSMENT/PLAN:  Patient presents with several different complaints (different  taste in mouth, L wrist pain, L thigh pain, decreased appetite, warm feeling). Challenging to find a unifying diagnosis. Considered polymyalgia rheumatica, will check sed rate. Constipation may contribute to decreased appetite. No obvious findings on physical exam.  Check labs: TSH, CMET, CBC with diff, and sed rate to evaluate for endocrine/metabolic/hematologic causes of her symptoms  Use miralax daily to help with regular bowel movements Gave rx for wrist splint to rest the wrist  Follow up in 2 weeks if not doing better, sooner if doing worse. Patient agreeable & appreciative.   [place]. Ardelia Mems, [place]

## 2019-02-28 LAB — CMP14+EGFR
ALT: 16 IU/L (ref 0–32)
AST: 24 IU/L (ref 0–40)
Albumin/Globulin Ratio: 1.5 (ref 1.2–2.2)
Albumin: 4.8 g/dL — ABNORMAL HIGH (ref 3.7–4.7)
Alkaline Phosphatase: 96 IU/L (ref 39–117)
BUN/Creatinine Ratio: 21 (ref 12–28)
BUN: 25 mg/dL (ref 8–27)
Bilirubin Total: 0.3 mg/dL (ref 0.0–1.2)
CO2: 20 mmol/L (ref 20–29)
Calcium: 10.5 mg/dL — ABNORMAL HIGH (ref 8.7–10.3)
Chloride: 103 mmol/L (ref 96–106)
Creatinine, Ser: 1.17 mg/dL — ABNORMAL HIGH (ref 0.57–1.00)
GFR calc Af Amer: 54 mL/min/{1.73_m2} — ABNORMAL LOW (ref >59)
GFR calc non Af Amer: 47 mL/min/{1.73_m2} — ABNORMAL LOW (ref >59)
Globulin, Total: 3.1 g/dL (ref 1.5–4.5)
Glucose: 119 mg/dL — ABNORMAL HIGH (ref 65–99)
Potassium: 3.9 mmol/L (ref 3.5–5.2)
Sodium: 143 mmol/L (ref 134–144)
Total Protein: 7.9 g/dL (ref 6.0–8.5)

## 2019-02-28 LAB — CBC
Hematocrit: 38.8 % (ref 34.0–46.6)
Hemoglobin: 13.1 g/dL (ref 11.1–15.9)
MCH: 31.6 pg (ref 26.6–33.0)
MCHC: 33.8 g/dL (ref 31.5–35.7)
MCV: 94 fL (ref 79–97)
Platelets: 188 10*3/uL (ref 150–450)
RBC: 4.14 x10E6/uL (ref 3.77–5.28)
RDW: 12.5 % (ref 11.7–15.4)
WBC: 7.1 10*3/uL (ref 3.4–10.8)

## 2019-02-28 LAB — TSH: TSH: 1.77 u[IU]/mL (ref 0.450–4.500)

## 2019-02-28 LAB — SEDIMENTATION RATE: Sed Rate: 34 mm/hr (ref 0–40)

## 2019-03-02 ENCOUNTER — Telehealth: Payer: Self-pay

## 2019-03-02 NOTE — Telephone Encounter (Signed)
Received prior auth request from [employer] for [employer]. Clinical questions submitted via cover my meds. Will await response.

## 2019-03-03 NOTE — Telephone Encounter (Signed)
error 

## 2019-03-04 ENCOUNTER — Other Ambulatory Visit: Payer: Self-pay | Admitting: *Deleted

## 2019-03-04 MED ORDER — AMLODIPINE BESYLATE 10 MG PO TABS: ORAL_TABLET | ORAL | 0 refills | Status: DC

## 2019-03-04 NOTE — Telephone Encounter (Signed)
Reviewed form, it is for [employer], I believe so she can get jardiance from the company. Dr. Valentina Lucks, is this something I need to complete or is there someone from the pharmacy team that handles this? I will keep it in my inbox for now.  Thanks [name], MD

## 2019-03-04 NOTE — Telephone Encounter (Signed)
There was a delay in getting meds from mail order.  Pt is out a amlodipine and [employer] is [facility]  To PCP. Christen Bame, CMA

## 2019-03-04 NOTE — Telephone Encounter (Signed)
Mometasone Furoate has been approved. The pharmacy has been notified.

## 2019-03-09 ENCOUNTER — Ambulatory Visit: Payer: Medicare HMO | Admitting: Cardiovascular Disease

## 2019-03-09 NOTE — Telephone Encounter (Signed)
Called to discuss glycemic control prior to sending out patient assistance program forms for empagliflozin (Jardiance). Patient is currently taking 10 mg once daily. Most recent A1c was 9.2 (01/19/19). Patient reported she has not been checking her sugars at home. Patient said she would be willing to increase her dose to 25 mg once daily. Patient also asked about a continuous glucose monitor for the future.  Forms for the patient assistance program were completed with the help of Dr. Ardelia Mems and faxed. Hard copy was placed in the scan box in the front office.

## 2019-03-11 ENCOUNTER — Other Ambulatory Visit: Payer: Self-pay | Admitting: Family Medicine

## 2019-03-13 ENCOUNTER — Telehealth: Payer: Self-pay | Admitting: Gastroenterology

## 2019-03-13 NOTE — Telephone Encounter (Signed)
Pt requested a call back to discuss recall letter that she had received.  She thought that her recall was in 2021.

## 2019-03-13 NOTE — Telephone Encounter (Signed)
Mallory Hayes patient: Spoke to the patient who had persistent questions about the change in colonoscopy guidelines and was uncomfortable with the date of the colonoscopy being pushed back. She is fearful of developing colonoscopy and reported the untimely passing of a young, prominent actor [name]) has made her even more fearful. She reported bouts of constipation and "gurgling" but really wanted to come in to speak with Dr. [name] about her GI concerns and colonoscopy guidelines.

## 2019-03-31 DIAGNOSIS — K006 Disturbances in tooth eruption: Secondary | ICD-10-CM | POA: Diagnosis not present

## 2019-04-17 ENCOUNTER — Encounter: Payer: Self-pay | Admitting: Gastroenterology

## 2019-04-17 ENCOUNTER — Ambulatory Visit (INDEPENDENT_AMBULATORY_CARE_PROVIDER_SITE_OTHER): Payer: Medicare HMO | Admitting: Gastroenterology

## 2019-04-17 VITALS — BP 130/64 | HR 60 | Temp 98.2°F | Ht 59.5 in | Wt 109.2 lb

## 2019-04-17 DIAGNOSIS — Z86010 Personal history of colonic polyps: Secondary | ICD-10-CM | POA: Diagnosis not present

## 2019-04-17 DIAGNOSIS — K5909 Other constipation: Secondary | ICD-10-CM | POA: Diagnosis not present

## 2019-04-17 NOTE — Progress Notes (Signed)
[facility] Consult Note:  History: Mallory Hayes 04/17/2019  Referring provider: [name], MD  Reason for consult/chief complaint: Constipation (having some constipation, want to discuss colonoscopy and guidlines)   Subjective  HPI:  Mallory Hayes wanted to meet with me today to discuss her history of colon polyps and appropriate colonoscopy interval.  When she received a letter that her recall has been back to years due to a change in guidelines, she became concerned.  Colonoscopy with Dr. Deatra Ina October 2015, complete exam to cecum, good preparation.  2 diminutive adenomatous polyps removed. No polyps on October 2010 colonoscopy with Dr. Deatra Ina Colonoscopy January 2009 with 2 advanced adenomas, reportedly 25 mm polyps in both left and right colon requiring EMR.  She has tended toward constipation for many years, controlled with as needed use of MiraLAX.  This was the case even with her most recent colonoscopy in 2015.  She denies rectal bleeding, any recent change in bowel habits, loss of appetite or weight loss.  ROS:  Review of Systems Denies chest pain dyspnea or dysuria  Past Medical History: Past Medical History:  Diagnosis Date  . GERD (gastroesophageal reflux disease)   . OSA (obstructive sleep apnea)   . Sleep apnea      Past Surgical History: Past Surgical History:  Procedure Laterality Date  . APPENDECTOMY    . BREAST EXCISIONAL BIOPSY Right    benign  . CHOLECYSTECTOMY    . COLONOSCOPY  2011  . PARTIAL HYSTERECTOMY  1991     Family History: Family History  Problem Relation Age of Onset  . Cancer Mother   . Hypertension Mother   . Diabetes Mother 32  . Colon cancer Mother 7  . Ovarian cancer Mother   . Hyperlipidemia Mother   . Cancer Father   . Prostatitis Brother   . Diabetes Brother     Social History: Social History   Socioeconomic History  . Marital status: Single    Spouse name: Not on file  . Number  of children: Not on file  . Years of education: Not on file  . Highest education level: Not on file  Occupational History  . Not on file  Tobacco Use  . Smoking status: Never Smoker  . Smokeless tobacco: Never Used  Substance and Sexual Activity  . Alcohol use: No  . Drug use: No  . Sexual activity: Yes    Birth control/protection: None  Other Topics Concern  . Not on file  Social History Narrative   Pt's 39 yo mother died 08-09-10 of breast cancer. Pt was very close with mother.  Has 2 siblings that live out of town, but has some cousins and friends in the [place] area.   Retired, never married   No h/o tob, EToH, or drug use   Enjoys walking, reading, dining out, and [employer]   Social Determinants of Health   Financial Resource Strain:   . Difficulty of Paying Living Expenses: Not on file  Food Insecurity:   . Worried About Charity fundraiser in the Last Year: Not on file  . Ran Out of Food in the Last Year: Not on file  Transportation Needs:   . Lack of Transportation (Medical): Not on file  . Lack of Transportation (Non-Medical): Not on file  Physical Activity:   . Days of Exercise per Week: Not on file  . Minutes of Exercise per Session: Not on file  Stress:   . Feeling of Stress :  Not on file  Social Connections:   . Frequency of Communication with Friends and Family: Not on file  . Frequency of Social Gatherings with Friends and Family: Not on file  . Attends Religious Services: Not on file  . Active Member of Clubs or Organizations: Not on file  . Attends Archivist Meetings: Not on file  . Marital Status: Not on file    Allergies: Allergies  Allergen Reactions  . Amaryl [Glimepiride] Anxiety  . Food Itching    Spices, peppers  . Sulfamethoxazole Itching, Rash and Other (See Comments)    sneezing sneezing    Outpatient Meds: Current Outpatient Medications  Medication Sig Dispense Refill  . ACCU-CHEK SOFTCLIX LANCETS lancets USE TO  CHECK BLOOD SUGAR ONCE DAILY (Patient taking differently: 1 each by Other route daily. Use to check blood sugar once daily) 100 each 12  . Alcohol Swabs (B-D SINGLE USE SWABS REGULAR) PADS Use to check blood sugar once daily 100 each 3  . amLODipine (NORVASC) 10 MG tablet TAKE 1 TABLET BY MOUTH EVERY DAY 90 tablet 3  . aspirin 81 MG tablet Take 81 mg by mouth daily.      . Blood Glucose Calibration (ACCU-CHEK AVIVA) SOLN USE AS NEEDED TO CALIBRATE GLUCOMETER (Patient taking differently: See admin instructions. Use as needed to calibrate glucometer) 1 each 0  . Blood Glucose Monitoring Suppl (ACCU-CHEK AVIVA PLUS) w/Device KIT USE TO CHECK BLOOD SUGAR ONCE DAILY (Patient taking differently: See admin instructions. Use to check blood sugar once daily) 1 kit 0  . Blood Pressure Monitoring (BLOOD PRESSURE CUFF) MISC 1 Device by Does not apply route daily. 1 each 0  . Blood Pressure Monitoring (BLOOD PRESSURE MONITOR AUTOMAT) DEVI Check blood pressure 3 times per week 1 Device 0  . Calcium Carbonate Antacid (TUMS PO) Take by mouth.    Water engineer Bandages & Supports (WRIST SPLINT/COCK-UP/LEFT M) MISC Wear daily 1 each 0  . empagliflozin (JARDIANCE) 10 MG TABS tablet Take 10 mg by mouth daily. 90 tablet 1  . furosemide (LASIX) 20 MG tablet Take 1 tablet (20 mg total) by mouth daily. 90 tablet 0  . glucose blood test strip CHECK BLOOD SUGAR ONE TIME DAILY 100 each 12  . lisinopril (ZESTRIL) 20 MG tablet TAKE 1 TABLET EVERY DAY 90 tablet 1  . metFORMIN (GLUCOPHAGE-XR) 500 MG 24 hr tablet Take 1 tablet (500 mg total) by mouth 2 (two) times daily. 180 tablet 3  . mometasone (NASONEX) 50 MCG/ACT nasal spray Place 2 sprays into the nose daily. 17 g 12  . Polyethylene Glycol 3350 (PEG 3350) 17 GM/SCOOP POWD Take 17g by mouth daily prn 850 g 1  . rosuvastatin (CRESTOR) 20 MG tablet Take 1 tablet (20 mg total) by mouth daily. 90 tablet 1  . vitamin E (E200) 200 UNIT capsule Take by mouth.    Marland Kitchen ibuprofen (ADVIL)  800 MG tablet Take 1 tablet by mouth as needed.     No current facility-administered medications for this visit.      ___________________________________________________________________ Objective   Exam:  BP 130/64 (BP Location: Left Arm, Patient Position: Sitting, Cuff Size: Normal)   Pulse 60   Temp 98.2 F (36.8 C)   Ht 4' 11.5" (1.511 m) Comment: Height measured without shoes  Wt 109 lb 4 oz (49.6 kg)   BMI 21.70 kg/m    General: Pleasant and well-appearing  CV: RRR without murmur, S1/S2, no JVD, no peripheral edema  Resp: clear to auscultation  bilaterally, normal RR and effort noted  GI: soft, no tenderness, with active bowel sounds. No guarding or palpable organomegaly noted.  Labs:  Prior colonoscopy and pathology reports as noted above, on file   Assessment: Encounter Diagnoses  Name Primary?  . Personal history of colonic polyps Yes  . Chronic constipation    We reviewed updated guidelines from earlier this year regarding polyp surveillance.  I feel that a 7-year interval is appropriate, I explained the rationale for these change in guidelines and she was reassured. Longstanding benign constipation, under control with current therapy.   Thank you for the courtesy of this consult.  Please call me with any questions or concerns.  Nelida Meuse III  CC: Referring provider noted above

## 2019-04-17 NOTE — Patient Instructions (Signed)
If you are age 72 or older, your body mass index should be between 23-30. Your Body mass index is 21.7 kg/m. If this is out of the aforementioned range listed, please consider follow up with your Primary Care Provider.  If you are age 64 or younger, your body mass index should be between 19-25. Your Body mass index is 21.7 kg/m. If this is out of the aformentioned range listed, please consider follow up with your Primary Care Provider.   It was a pleasure to see you today!  Dr. Danis  

## 2019-04-20 ENCOUNTER — Other Ambulatory Visit: Payer: Self-pay

## 2019-04-20 NOTE — Patient Outreach (Signed)
[facility]) Care Management  04/20/2019  Dontavia Brand 05-30-1945 [id number]   Telephone call to patient for disease management follow up.  No answer.  HIPAA compliant voice message left.  Plan: RN CM will attempt patient again in the month of February and send letter.  [name], RN, MSN [place] Management Care Management Coordinator Direct Line [phone] Cell [phone] Toll Free: [phone]  Fax: [phone]

## 2019-04-21 ENCOUNTER — Ambulatory Visit: Payer: Self-pay

## 2019-04-29 ENCOUNTER — Other Ambulatory Visit: Payer: Self-pay

## 2019-04-29 ENCOUNTER — Other Ambulatory Visit: Payer: Self-pay | Admitting: Family Medicine

## 2019-04-29 MED ORDER — ROSUVASTATIN CALCIUM 20 MG PO TABS: 20.0000 mg | ORAL_TABLET | Freq: Every day | ORAL | 0 refills | Status: DC

## 2019-04-29 NOTE — Telephone Encounter (Signed)
[facility] pharmacy calling nurse line to request that patient get sent in a two [facility] [place]  To PCP Talbot Grumbling, RN

## 2019-05-20 ENCOUNTER — Ambulatory Visit: Payer: Medicare HMO | Admitting: Podiatry

## 2019-06-08 ENCOUNTER — Other Ambulatory Visit: Payer: Self-pay | Admitting: Family Medicine

## 2019-06-08 NOTE — Telephone Encounter (Signed)
Please ask patient to schedule a follow up visit with me, thanks! Cambell Rickenbach J Anneli Bing, MD  

## 2019-06-08 NOTE — Telephone Encounter (Signed)
Follow up appointment scheduled.   Talbot Grumbling, RN

## 2019-06-10 ENCOUNTER — Other Ambulatory Visit: Payer: Self-pay

## 2019-06-10 ENCOUNTER — Encounter: Payer: Self-pay | Admitting: Podiatry

## 2019-06-10 ENCOUNTER — Encounter: Payer: Self-pay | Admitting: Family Medicine

## 2019-06-10 ENCOUNTER — Ambulatory Visit: Payer: Medicare HMO | Admitting: Podiatry

## 2019-06-10 DIAGNOSIS — B351 Tinea unguium: Secondary | ICD-10-CM | POA: Diagnosis not present

## 2019-06-10 DIAGNOSIS — E1169 Type 2 diabetes mellitus with other specified complication: Secondary | ICD-10-CM

## 2019-06-10 DIAGNOSIS — M79674 Pain in right toe(s): Secondary | ICD-10-CM

## 2019-06-10 DIAGNOSIS — M79675 Pain in left toe(s): Secondary | ICD-10-CM

## 2019-06-10 LAB — HM DIABETES FOOT EXAM

## 2019-06-10 NOTE — Progress Notes (Signed)
Patient ID: Mallory Hayes, female   DOB: [birth year], 74 y.o.   MRN: [id number] Complaint:  Visit Type: Patient returns to my office for continued preventative foot care services. Complaint: Patient states" my nails have grown long and thick and become painful to walk and wear shoes" Patient has been diagnosed with DM with no foot complications. The patient presents for preventative foot care services. No changes to ROS  Podiatric Exam: Vascular: dorsalis pedis and posterior tibial pulses are palpable bilateral. Capillary return is immediate. Temperature gradient is WNL. Skin turgor WNL  Sensorium: Normal Semmes Weinstein monofilament test. Normal tactile sensation bilaterally. Nail Exam: Pt has thick disfigured discolored nails with subungual debris noted bilateral entire nail hallux through fifth toenails Ulcer Exam: There is no evidence of ulcer or pre-ulcerative changes or infection. Orthopedic Exam: Muscle tone and strength are WNL. No limitations in general ROM. No crepitus or effusions noted. HAV  B/L and tailors bunion  B/L Skin: No Porokeratosis. No infection or ulcers  Diagnosis:  Onychomycosis, , Pain in right toe, pain in left toes  Treatment & Plan Procedures and Treatment: Consent by patient was obtained for treatment procedures. The patient understood the discussion of treatment and procedures well. All questions were answered thoroughly reviewed. Debridement of mycotic and hypertrophic toenails, 1 through 5 bilateral and clearing of subungual debris. No ulceration, no infection noted.    Return Visit-Office Procedure: Patient instructed to return to the office for a follow up visit 3 months for continued evaluation and treatment.   Gardiner Barefoot DPM

## 2019-06-16 ENCOUNTER — Other Ambulatory Visit: Payer: Self-pay

## 2019-06-16 NOTE — Patient Outreach (Addendum)
[facility]) Care Management  06/16/2019  Mallory Hayes 20-Apr-1946 [id number]   Telephone call to patient for disease management follow up.  No answer.  Unable to leave a message.    Plan: RN CM will attempt patient again in the month of April.    [name], RN, MSN [place] [facility] Care Management Coordinator Direct Line [phone] Cell [phone] Toll Free: [phone]  Fax: [phone]

## 2019-06-17 ENCOUNTER — Ambulatory Visit: Payer: Self-pay

## 2019-06-26 ENCOUNTER — Ambulatory Visit: Payer: Medicare HMO | Admitting: Family Medicine

## 2019-07-27 ENCOUNTER — Other Ambulatory Visit: Payer: Self-pay | Admitting: Pharmacy Technician

## 2019-07-27 ENCOUNTER — Telehealth: Payer: Self-pay | Admitting: Pharmacist

## 2019-07-27 NOTE — Patient Outreach (Signed)
[facility]) Care Management  07/27/2019  Mallory Hayes 1945/10/24 [id number]  Successful outgoing call placed to patient in regards to voicemail she left.  Spoke to patient, HIPAA identifiers verified.  Patient was inquiring if I knew how she could obtain the [facility] for free. Inquired if patient had contacted [employer] and she had not. Informed patient that might br a first step. Also informed patient that I had given her information to [place] to outreach her in the next several days depending on her schedule due to the upcoming Easter holiday. Patient also informed she would reach out to [facility].  Patient also inquired is she would need to reapply for Jardiance as we helped her last year. Informed patient that she would need to reapply and I would have the pharmacist talk with her about that too.  Inbasket message and phone call was made to [place] to assist patient.  Tomeika Weinmann P. Shylin Keizer, [place]  [phone]

## 2019-07-27 NOTE — Patient Outreach (Addendum)
[facility]) Care Management  07/27/2019  Mallory Hayes October 29, 1945 [id number]   Patient: Name:  Mallory Hayes  DOB:  11-Jun-1945  Reason for referral:  Medication Assistance      Referral source:  Self   Provider:  Chrisandra Netters, MD   Insurance: [facility]     Medication Review Findings   Patient is a 74 year old female who reached out to [employer], CPhT about medication assistance with [name] and questions about how to obtain a [employer] from [place].  Spoke with patient via telephone. HIPAA identifiers were obtained.  HgA1c- 9.2% (09/21)-not at goal, needs new labs On Rosuvastatin last filled #90 07/01/2019  Based on initial income, patient MAY qualify for LIS/Extra Help, as such, an application was completed for her via telephone/online.  She will be sent an application to apply for Jardiance through [place] Simcox CPhT, will follow up about medication assistance.  Patient also inquired about her dental bill and about getting a [employer] system. She was encouraged to call [employer] about her dental bill and [employer]'s Mail Order Pharmacy about what she needs to do to get a Freestyle Libre at no cost.  She communicated understanding about the necessary financial documentation (Social Security Benefits Letter and Print production planner) as well as being on the "look out" for a Determination Letter about LIS/Extra Help.  Plan: Patient will be followed by [name], CPhT for medication assistance.       Medications Reviewed Today    Reviewed by Elayne Guerin, [facility] (Pharmacist) on 07/27/19 at 1156  Med List Status: <None>  Medication Order Taking? Sig Documenting Provider Last Dose Status Informant  ACCU-CHEK SOFTCLIX LANCETS lancets [id number] Yes USE TO CHECK BLOOD SUGAR ONCE DAILY  Patient taking differently: 1 each by Other route daily. Use to check blood sugar once daily   [name],  MD Taking Active   Alcohol Swabs (B-D SINGLE USE SWABS REGULAR) PADS [id number] Yes Use to check blood sugar once daily [name], MD Taking Active   amLODipine (NORVASC) 10 MG tablet [id number] Yes TAKE 1 TABLET BY MOUTH EVERY DAY [name], MD Taking Active   aspirin 81 MG tablet [id number] Yes Take 81 mg by mouth daily.   [provider] Taking Active   Blood Glucose Calibration (ACCU-CHEK AVIVA) SOLN [id number] Yes USE AS DIRECTED AS NEEDED TO CALIBRATE GLUCOMETER Lind Covert, MD Taking Active   Blood Glucose Monitoring Suppl (ACCU-CHEK AVIVA PLUS) w/Device KIT [id number] Yes USE TO CHECK BLOOD SUGAR ONCE DAILY  Patient taking differently: See admin instructions. Use to check blood sugar once daily   [name], MD Taking Active   Blood Pressure Monitoring (BLOOD PRESSURE MONITOR AUTOMAT) DEVI [id number] Yes Check blood pressure 3 times per week [name], MD Taking Active   Calcium Carbonate Antacid (TUMS PO) [id number] Yes Take by mouth. [provider] Taking Active   Elastic Bandages & Supports (WRIST SPLINT/COCK-UP/LEFT M) MISC [id number] Yes Wear daily [name], MD Taking Active   empagliflozin (JARDIANCE) 10 MG TABS tablet [id number] Yes Take 10 mg by mouth daily. [name], MD Taking Active   furosemide (LASIX) 20 MG tablet [id number] Yes TAKE 1 TABLET BY MOUTH EVERY DAY [name], MD Taking Active   lisinopril (ZESTRIL) 20 MG tablet [id number] Yes TAKE 1 TABLET EVERY DAY [name], MD Taking Active   metFORMIN (GLUCOPHAGE-XR) 500 MG 24 hr  tablet [id number] Yes Take 1 tablet (500 mg total) by mouth 2 (two) times daily. Martyn Malay, MD Taking Active   mometasone (NASONEX) 50 MCG/ACT nasal spray [id number] No Place 2 sprays into the nose daily.  Patient not taking: Reported on 07/27/2019   [name], MD Not Taking Active   Polyethylene Glycol 3350 (PEG 3350) 17 GM/SCOOP POWD  [id number] Yes Take 17g by mouth daily prn [name], MD Taking Active   rosuvastatin (CRESTOR) 20 MG tablet [id number] Yes TAKE 1 TABLET EVERY DAY Chambliss, Jeb Levering, MD Taking Active   vitamin E (E200) 200 UNIT capsule [id number] Yes Take by mouth. [provider] Taking Active           Plan: [place] [facility])  [place] pharmacy case will be closed as our team is transitioning from the [place] into the [facility] Quality Department and will no longer be using [facility] for documentation purposes.   [facility] pharmacy technician will continue to assist patient with medication assistance program applications until complete.     Elayne Guerin, PharmD, [place] Clinical Pharmacist [phone]

## 2019-07-28 ENCOUNTER — Ambulatory Visit (INDEPENDENT_AMBULATORY_CARE_PROVIDER_SITE_OTHER): Payer: Medicare HMO | Admitting: Family Medicine

## 2019-07-28 ENCOUNTER — Other Ambulatory Visit: Payer: Self-pay

## 2019-07-28 ENCOUNTER — Other Ambulatory Visit: Payer: Self-pay | Admitting: Pharmacy Technician

## 2019-07-28 VITALS — BP 125/60 | HR 57 | Ht 61.0 in | Wt 111.6 lb

## 2019-07-28 DIAGNOSIS — E1169 Type 2 diabetes mellitus with other specified complication: Secondary | ICD-10-CM

## 2019-07-28 DIAGNOSIS — J309 Allergic rhinitis, unspecified: Secondary | ICD-10-CM | POA: Diagnosis not present

## 2019-07-28 DIAGNOSIS — I10 Essential (primary) hypertension: Secondary | ICD-10-CM

## 2019-07-28 DIAGNOSIS — Z1231 Encounter for screening mammogram for malignant neoplasm of breast: Secondary | ICD-10-CM

## 2019-07-28 DIAGNOSIS — E785 Hyperlipidemia, unspecified: Secondary | ICD-10-CM | POA: Diagnosis not present

## 2019-07-28 LAB — POCT GLYCOSYLATED HEMOGLOBIN (HGB A1C): HbA1c, POC (controlled diabetic range): 8.8 % — AB (ref 0.0–7.0)

## 2019-07-28 MED ORDER — MOMETASONE FUROATE 50 MCG/ACT NA SUSP: 2.0000 | Freq: Every day | NASAL | 12 refills | Status: DC

## 2019-07-28 NOTE — Patient Instructions (Addendum)
See the handout on how to schedule your mammogram. This is an important test to screen for breast cancer.  Refilled nasonex  Will check with our pharmacist about freestyle libre order and get back with you  Schedule lab appointment next week  Get your tetanus shot in a month or so  Referring to nutritionist  Follow up with me in 3 months, sooner if needed  Be well, Dr. Ardelia Mems

## 2019-07-28 NOTE — Patient Outreach (Signed)
[place] [facility]) Care Management  07/28/2019  Averie Hornbaker Nov 18, 1945 [id number]                                        Medication Assistance Referral  Referral From: [place]  Medication/Company: [name] / [employer] Patient application portion:  Mailed Provider application portion: Faxed  to Dr. Chrisandra Netters Provider address/fax verified via: Office website    Follow up:  Will follow up with patient in 10-14 business days to confirm application(s) have been received.  Hilding Quintanar P. Naveh Rickles, [place]  [phone]

## 2019-07-29 NOTE — Assessment & Plan Note (Signed)
Patient to return next week for fasting lipids Continue crestor, have some room to titrate up if needed.

## 2019-07-29 NOTE — Assessment & Plan Note (Signed)
Improved today with A1c of 8.8. I would like to see her A1c get under 8 if possible. She strongly prefers to avoid insulin. There is room to improve her diet. Discussed some techniques today. Referral placed to diabetes educator for nutrition advice. Follow up in 3 months.

## 2019-07-29 NOTE — Assessment & Plan Note (Signed)
Refill nasonex

## 2019-07-29 NOTE — Progress Notes (Signed)
  Date of Visit: 07/28/2019   SUBJECTIVE:   HPI:  Mallory Hayes presents today for routine follow up.  Diabetes - currently taking jardiance 10mg  daily and metformin XR 500mg  twice daily. Tolerating these well. She is not checking her sugars regularly. Is interested in a freestyle libre glucose monitor. Eats sweets frequently and is motivated to make changes in her diet.  Hypertension - currently taking lasix 20mg  daily, lisinopril 20mg  daily, and amlodipine 10mg  every other day. Tolerating well.  Seasonal allergies - requests refill of nasonex, has been out of it, works well when she uses it  Hyperlipidemia - taking crestor 20mg  daily. Patient prefers not to get lipids drawn today as she is getting her second COVID vaccine tomorrow and doesn't want so many needlesticks in close succession.  OBJECTIVE:   BP 125/60   Pulse (!) 57   Ht 5\' 1"  (1.549 m)   Wt 111 lb 9.6 oz (50.6 kg)   SpO2 99%   BMI 21.09 kg/m  Gen: no acute distress, pleasant, cooperative HEENT: normocephalic, atraumatic  Heart: regular rate and rhythm, no murmur Lungs: clear to auscultation bilaterally, normal work of breathing  Neuro: alert, speech normal, grossly nonfocal Ext: No appreciable lower extremity edema bilaterally   ASSESSMENT/PLAN:   Health maintenance:  -advised to get Tdap at least 14 days after her second COVID vaccine (per current CDC recommendations) -ordered mammogram & gave patient handout to schedule  Dyslipidemia Patient to return next week for fasting lipids Continue crestor, have some room to titrate up if needed.  Essential hypertension Well controlled. Continue current medication regimen.   Type 2 diabetes mellitus Improved today with A1c of 8.8. I would like to see her A1c get under 8 if possible. She strongly prefers to avoid insulin. There is room to improve her diet. Discussed some techniques today. Referral placed to diabetes educator for nutrition advice. Follow up in 3 months.    Allergic rhinitis Refill nasonex  FOLLOW UP: Follow up in  mos for above issues  [place] J. Ardelia Mems, [place]

## 2019-07-29 NOTE — Assessment & Plan Note (Signed)
Well controlled. Continue current medication regimen.  

## 2019-07-30 ENCOUNTER — Telehealth: Payer: Self-pay

## 2019-07-30 NOTE — Telephone Encounter (Signed)
Called and LVM for patient to call office.  Mallory Hayes, [place]

## 2019-07-30 NOTE — Telephone Encounter (Signed)
-----   Message from [name], MD sent at 07/29/2019 12:03 PM EDT ----- Red team, can you call patient and let her know that I looked into the freestyle libre blood glucose monitor she wanted. She does not qualify for it (has to be taking insulin multiple times per day for medicare to cover it). I am happy to send in another meter for her if she would like.  Thanks [name], MD

## 2019-08-03 ENCOUNTER — Telehealth: Payer: Self-pay | Admitting: *Deleted

## 2019-08-03 NOTE — Telephone Encounter (Signed)
-----   Message from [name], MD sent at 07/29/2019 12:03 PM EDT ----- Red team, can you call patient and let her know that I looked into the freestyle libre blood glucose monitor she wanted. She does not qualify for it (has to be taking insulin multiple times per day for medicare to cover it). I am happy to send in another meter for her if she would like.  Thanks [name], MD

## 2019-08-03 NOTE — Telephone Encounter (Signed)
LMOVM informing pt. Mallory Hayes, CMA  

## 2019-08-06 ENCOUNTER — Other Ambulatory Visit: Payer: Medicare HMO

## 2019-08-06 ENCOUNTER — Other Ambulatory Visit: Payer: Self-pay

## 2019-08-06 DIAGNOSIS — E1169 Type 2 diabetes mellitus with other specified complication: Secondary | ICD-10-CM

## 2019-08-07 LAB — LIPID PANEL
Chol/HDL Ratio: 2 ratio (ref 0.0–4.4)
Cholesterol, Total: 169 mg/dL (ref 100–199)
HDL: 86 mg/dL (ref >39)
LDL Chol Calc (NIH): 75 mg/dL (ref 0–99)
Triglycerides: 37 mg/dL (ref 0–149)
VLDL Cholesterol Cal: 8 mg/dL (ref 5–40)

## 2019-08-07 LAB — BASIC METABOLIC PANEL
BUN/Creatinine Ratio: 18 (ref 12–28)
BUN: 24 mg/dL (ref 8–27)
CO2: 22 mmol/L (ref 20–29)
Calcium: 9.7 mg/dL (ref 8.7–10.3)
Chloride: 103 mmol/L (ref 96–106)
Creatinine, Ser: 1.33 mg/dL — ABNORMAL HIGH (ref 0.57–1.00)
GFR calc Af Amer: 46 mL/min/{1.73_m2} — ABNORMAL LOW (ref >59)
GFR calc non Af Amer: 40 mL/min/{1.73_m2} — ABNORMAL LOW (ref >59)
Glucose: 158 mg/dL — ABNORMAL HIGH (ref 65–99)
Potassium: 3.8 mmol/L (ref 3.5–5.2)
Sodium: 140 mmol/L (ref 134–144)

## 2019-08-10 ENCOUNTER — Other Ambulatory Visit: Payer: Self-pay | Admitting: Pharmacy Technician

## 2019-08-10 ENCOUNTER — Encounter: Payer: Self-pay | Admitting: Pharmacy Technician

## 2019-08-10 ENCOUNTER — Other Ambulatory Visit: Payer: Self-pay | Admitting: Family Medicine

## 2019-08-10 ENCOUNTER — Telehealth: Payer: Self-pay

## 2019-08-10 MED ORDER — FLUTICASONE PROPIONATE 50 MCG/ACT NA SUSP: 2.0000 | Freq: Every day | NASAL | 11 refills | Status: DC

## 2019-08-10 NOTE — Telephone Encounter (Signed)
-----   Message from Elayne Guerin, [facility] sent at 08/10/2019  8:59 AM EDT -----  ----- Message ----- From: Jason Fila, CPhT Sent: 08/10/2019   8:18 AM EDT To: Elayne Guerin, RPH  Good am,  This lady left me a voicemail about Nasonex and it begin $300 some dollars and was inquiring about a patient assistance plan for that. I looked on needymeds and didn't see one.  She has also misplaced the return envelope I sent with the application. SO, before I call her back I thought I see what you knew.  Thanks, Sharee Pimple

## 2019-08-10 NOTE — Patient Outreach (Addendum)
Received inquiry from Mrs. Cristina regarding price of her Nasonex nasal spray. Patient reports [facility] was >$[facility]. After [facility] this price was confirmed for a 90 day supply. Patient did not pick up for this reason.   Spoke to Mrs. Tutterow over the phone regarding the cost of her Nasonex nasal spray.  Based on patient's [facility] Medicare formulary, Nasonex (brand) and mometasone furoate (generic) are both tier 4 medications.   Fluticasone propionate is a tier 2 medication and would be less costly to the patient and provide similar efficacy. Patient was agreeable to trying fluticasone propionate nasal spray and utilizing [employer]'s mail order pharmacy.    Plan: We will reach out to the provider with this recommendation and follow up with the patient regarding effectiveness and affordability in 1-2 weeks.   I agree with the above documentation and provided clinical supervision and will follow up with the patient in 1 week.  Elayne Guerin, PharmD, [place] Clinical Pharmacist [phone]

## 2019-08-10 NOTE — Patient Outreach (Signed)
[place] [facility]) Care Management  08/10/2019  Mallory Hayes [birth year] [id number]     Return call placed to patient regarding patient assistance application(s) for Jardiance with [employer] , HIPAA identifiers verified.   Patient left me a voicemail. Returned patient's call. Patient informed she has received the applications but the enclosed envelope was not able to be found.  Will mail patient out another self addressed stamped envelope so that she can mail back her application and supporting documents.  Follow up:  Will route note to [employer] for case closure if document(s) have not been received in the next 15-20 business days.  [name] P. Marchia Diguglielmo, [place]  [phone]

## 2019-08-14 ENCOUNTER — Other Ambulatory Visit: Payer: Self-pay

## 2019-08-14 NOTE — Patient Outreach (Signed)
[place] [facility]) Care Management  [place]  08/14/2019   Mallory Hayes [birth year] [id number]  Subjective: Telephone call to patient for disease management follow up. Patient reports she is doing good and saw her physician last month. A1c some improved but patient still has work to do.  She states she is going to see the nutritionist in May.  We discussed her diet and patient admits to high carbohydrate intake.  Encouraged patient to read labels of food to see carbohydrate count as if she is aware of the carbohydrate count she will possibly do better with eating large amounts of carbohydrates.  She verbalized understanding.   Objective:   Encounter Medications:  Outpatient Encounter Medications as of 08/14/2019  Medication Sig  . ACCU-CHEK SOFTCLIX LANCETS lancets USE TO CHECK BLOOD SUGAR ONCE DAILY (Patient taking differently: 1 each by Other route daily. Use to check blood sugar once daily)  . Alcohol Swabs (B-D SINGLE USE SWABS REGULAR) PADS Use to check blood sugar once daily  . amLODipine (NORVASC) 10 MG tablet TAKE 1 TABLET BY MOUTH EVERY DAY  . aspirin 81 MG tablet Take 81 mg by mouth daily.    . Blood Glucose Calibration (ACCU-CHEK AVIVA) SOLN USE AS DIRECTED AS NEEDED TO CALIBRATE GLUCOMETER  . Blood Glucose Monitoring Suppl (ACCU-CHEK AVIVA PLUS) w/Device KIT USE TO CHECK BLOOD SUGAR ONCE DAILY (Patient taking differently: See admin instructions. Use to check blood sugar once daily)  . Blood Pressure Monitoring (BLOOD PRESSURE MONITOR AUTOMAT) DEVI Check blood pressure 3 times per week  . Calcium Carbonate Antacid (TUMS PO) Take by mouth.  Regino Schultze Bandages & Supports (WRIST SPLINT/COCK-UP/LEFT M) MISC Wear daily  . empagliflozin (JARDIANCE) 10 MG TABS tablet Take 10 mg by mouth daily.  . fluticasone (FLONASE) 50 MCG/ACT nasal spray Place 2 sprays into both nostrils daily.  . furosemide (LASIX) 20 MG tablet TAKE 1 TABLET BY MOUTH EVERY DAY  . lisinopril  (ZESTRIL) 20 MG tablet TAKE 1 TABLET EVERY DAY  . metFORMIN (GLUCOPHAGE-XR) 500 MG 24 hr tablet Take 1 tablet (500 mg total) by mouth 2 (two) times daily.  . mometasone (NASONEX) 50 MCG/ACT nasal spray Place 2 sprays into the nose daily.  . Polyethylene Glycol 3350 (PEG 3350) 17 GM/SCOOP POWD Take 17g by mouth daily prn  . rosuvastatin (CRESTOR) 20 MG tablet TAKE 1 TABLET EVERY DAY  . vitamin E (E200) 200 UNIT capsule Take by mouth.   No facility-administered encounter medications on file as of 08/14/2019.    Functional Status:  In your present state of health, do you have any difficulty performing the following activities: 08/14/2019  Hearing? N  Vision? N  Difficulty concentrating or making decisions? N  Walking or climbing stairs? N  Dressing or bathing? N  Doing errands, shopping? N  Preparing Food and eating ? N  Using the Toilet? N  In the past six months, have you accidently leaked urine? N  Do you have problems with loss of bowel control? N  Managing your Medications? N  Managing your Finances? N  Housekeeping or managing your Housekeeping? N  Some recent data might be hidden    Fall/Depression Screening: Fall Risk  08/14/2019 02/18/2019 11/28/2018  Falls in the past year? 0 0 0  Number falls in past yr: - - -  Injury with Fall? - - -  Risk Factor Category  - - -  Risk for fall due to : - - -  Follow up - - -  PHQ 2/9 Scores 08/14/2019 07/28/2019 08/27/2018 06/30/2018 06/16/2018 05/06/2018 02/10/2018  PHQ - 2 Score 1 4 1  0 0 0 0  PHQ- 9 Score - - - - - - -   SDOH Screenings   Alcohol Screen:   . Last Alcohol Screening Score (AUDIT):   Depression (PHQ2-9): Low Risk   . PHQ-2 Score: 1  Financial Resource Strain:   . Difficulty of Paying Living Expenses:   Food Insecurity: No Food Insecurity  . Worried About Charity fundraiser in the Last Year: Never true  . Ran Out of Food in the Last Year: Never true  Housing:   . Last Housing Risk Score:   Physical Activity:   .  Days of Exercise per Week:   . Minutes of Exercise per Session:   Social Connections:   . Frequency of Communication with Friends and Family:   . Frequency of Social Gatherings with Friends and Family:   . Attends Religious Services:   . Active Member of Clubs or Organizations:   . Attends Archivist Meetings:   Marland Kitchen Marital Status:   Stress:   . Feeling of Stress :   Tobacco Use: Low Risk   . Smoking Tobacco Use: Never Smoker  . Smokeless Tobacco Use: Never Used  Transportation Needs: No Transportation Needs  . Lack of Transportation (Medical): No  . Lack of Transportation (Non-Medical): No     Assessment: Patient continues to manage chronic illnesses and benefit from disease management outreach.  Plan:  [facility] CM Care Plan Problem One     Most Recent Value  Care Plan Problem One  Knowledge deficit related to diease management of diabetes  Role Documenting the Problem One  Care Management Telephonic Coordinator  Care Plan for Problem One  Active  [facility] Long Term Goal   in 60 days the patient will decrease her a1c of 9.2 by 1-2 points  [facility] Long Term Goal Start Date  08/14/19  Interventions for Problem One Long Term Goal  RN CM reviewed food intake.  Stressed the importance of carbohydrate counting.  Reiterated importance of checking sugars daily.     RN CM will contact patient in the month of June and patient agreeable.   [name], RN, MSN [place] Management Care Management Coordinator Direct Line [phone] Cell [phone] Toll Free: [phone]  Fax: [phone]

## 2019-08-18 ENCOUNTER — Other Ambulatory Visit: Payer: Self-pay | Admitting: Family Medicine

## 2019-08-19 ENCOUNTER — Ambulatory Visit: Payer: Medicare HMO

## 2019-08-20 ENCOUNTER — Other Ambulatory Visit: Payer: Self-pay | Admitting: Pharmacy Technician

## 2019-08-20 ENCOUNTER — Encounter: Payer: Self-pay | Admitting: Family Medicine

## 2019-08-20 ENCOUNTER — Other Ambulatory Visit: Payer: Self-pay | Admitting: Family Medicine

## 2019-08-20 ENCOUNTER — Other Ambulatory Visit: Payer: Self-pay

## 2019-08-20 ENCOUNTER — Ambulatory Visit
Admission: RE | Admit: 2019-08-20 | Discharge: 2019-08-20 | Disposition: A | Discharge status: Home / Self Care | Payer: Medicare HMO | Source: Ambulatory Visit | Attending: Family Medicine | Admitting: Family Medicine

## 2019-08-20 DIAGNOSIS — Z1231 Encounter for screening mammogram for malignant neoplasm of breast: Secondary | ICD-10-CM

## 2019-08-20 NOTE — Patient Outreach (Signed)
[place] [facility]) Care Management  08/20/2019  Crystallee Werden 1945-09-21 [id number]  Received both patient and provider portion(s) of patient assistance application(s) for Jardiance. Faxed completed application and required documents into [employer].  Will follow up with company(ies) in 5-10 business days to check status of application(s).  Toriann Spadoni P. Valeen Borys, [place]  [phone]

## 2019-08-21 ENCOUNTER — Ambulatory Visit: Payer: Self-pay | Admitting: Pharmacist

## 2019-08-27 ENCOUNTER — Other Ambulatory Visit: Payer: Self-pay | Admitting: Pharmacy Technician

## 2019-08-27 NOTE — Patient Outreach (Signed)
[place] [facility]) Care Management  08/27/2019  Mallory Hayes 01-Aug-1945 [id number]  Care coordination call placed to [employer] in regards to patient's application for Jardiance.  Spoke to Mallory Hayes who informed they received the application and patient's next order was placed 08/25/2019. Inquired when patient was approved for the program and [name] informed she had been APPROVED since 05/01/2019-04/29/2020. [name] again stated last order was placed 08/25/2019 and should be arriving to the patient's homes in the next 7-10 business days.  Akua Blethen P. Daizha Anand, [place]  [phone]

## 2019-09-01 ENCOUNTER — Ambulatory Visit: Payer: Medicare HMO | Admitting: Dietician

## 2019-09-06 ENCOUNTER — Other Ambulatory Visit: Payer: Self-pay | Admitting: Family Medicine

## 2019-09-07 ENCOUNTER — Other Ambulatory Visit: Payer: Self-pay | Admitting: Pharmacy Technician

## 2019-09-07 NOTE — Patient Outreach (Signed)
[facility] [facility]) Care Management  09/07/2019  Juline Sanderford 1945-08-15 [id number]   Successful call placed to patient regarding patient assistance medication delivery of Jardiance from [facility], HIPAA identifiers verified.   Patient informed she received a 90 days supply of Jardiance. Discussed refill procedure with patient which requires patient to call [employer] to re order her refills. Patient was able to locate the phone number while on the line with me. Informed patient to call when she has approximately 2 weeks supply to remaining to avoid a delay in therapy. Patient verbalized understanding the refill process. Confirmed patient had name and number as she had no other questions or concerns.  Follow up:  Will remove myself from care team as patient assistance has been completed.  Cabrini Ruggieri P. Tahir Blank, [place]  [phone]

## 2019-09-09 ENCOUNTER — Ambulatory Visit: Payer: Medicare HMO | Admitting: Podiatry

## 2019-09-14 ENCOUNTER — Other Ambulatory Visit: Payer: Self-pay

## 2019-09-14 ENCOUNTER — Encounter: Payer: Self-pay | Admitting: Family Medicine

## 2019-09-14 ENCOUNTER — Ambulatory Visit (INDEPENDENT_AMBULATORY_CARE_PROVIDER_SITE_OTHER): Payer: Medicare HMO | Admitting: Family Medicine

## 2019-09-14 VITALS — BP 132/60 | HR 53 | Ht 61.0 in | Wt 110.8 lb

## 2019-09-14 DIAGNOSIS — L989 Disorder of the skin and subcutaneous tissue, unspecified: Secondary | ICD-10-CM

## 2019-09-14 DIAGNOSIS — W57XXXA Bitten or stung by nonvenomous insect and other nonvenomous arthropods, initial encounter: Secondary | ICD-10-CM

## 2019-09-14 MED ORDER — DOXYCYCLINE HYCLATE 100 MG PO TABS: 100.0000 mg | ORAL_TABLET | Freq: Two times a day (BID) | ORAL | 0 refills | Status: DC

## 2019-09-14 NOTE — Patient Instructions (Signed)

## 2019-09-15 DIAGNOSIS — W57XXXA Bitten or stung by nonvenomous insect and other nonvenomous arthropods, initial encounter: Secondary | ICD-10-CM | POA: Insufficient documentation

## 2019-09-15 NOTE — Assessment & Plan Note (Signed)
Discussed the pros versus cons of doxycycline as a prophylaxis given the takes and not been definitively identified and there was an unknown amount of time that they were attached.  Shared decision making results and patient electing to take doxycycline, return precautions discussed

## 2019-09-15 NOTE — Progress Notes (Signed)
    SUBJECTIVE:   CHIEF COMPLAINT / HPI: Tick bite  Patient who works regularly in her garden and is constantly outside in the plants said that she had been having itchiness for the last couple days, she said this morning she found 2 ticks on her body one on her shoulder one on her hip.  She has had difficulty visualizing these areas and said that she is unsure if there was actually a rash.  She has had no fevers or other symptoms beyond itchiness  PERTINENT  PMH / PSH:   OBJECTIVE:   BP 132/60   Pulse (!) 53   Ht 5\' 1"  (1.549 m)   Wt 110 lb 12.8 oz (50.3 kg)   SpO2 100%   BMI 20.94 kg/m   General: Alert pleasant no distress Skin: No target lesion, there is mild induration without surrounding cellulitis or rash at the 2 locations patient identifies having taken the tick from, she does not have the bugs with her   ASSESSMENT/PLAN:   Tick bite Discussed the pros versus cons of doxycycline as a prophylaxis given the takes and not been definitively identified and there was an unknown amount of time that they were attached.  Shared decision making results and patient electing to take doxycycline, return precautions discussed     Mallory Hayes, [place]

## 2019-09-16 MED ORDER — DOXYCYCLINE HYCLATE 100 MG PO TABS: 200.0000 mg | ORAL_TABLET | Freq: Once | ORAL | 0 refills | Status: AC

## 2019-09-16 NOTE — Progress Notes (Signed)
Further review noted that Doxy-200 milligrams 1 time is appropriate dose for unconfirmed tick exposure.  Called patient and notified her she has not taken more than the 200 mg milligrams, will stop taking additional pills and will bring in the rest to our office to dispose of  -Dr. Criss Rosales  *Change medication dose in computer for reference

## 2019-09-16 NOTE — Addendum Note (Signed)
Addended by: Sherene Sires R on: 09/16/2019 11:02 AM   Modules accepted: Orders

## 2019-10-06 ENCOUNTER — Ambulatory Visit: Payer: Medicare HMO | Admitting: Registered"

## 2019-10-13 ENCOUNTER — Other Ambulatory Visit: Payer: Self-pay

## 2019-10-13 NOTE — Patient Outreach (Signed)
[facility] [facility]) Care Management  10/13/2019  Mallory Hayes 01/05/1946 [id number]   Telephone call to patient for disease management follow up. No answer. HIPAA compliant voice message left.  Plan: RN CM will wait return call. If no return call will attempt again in the month of September and send letter.  [name], RN, MSN [place] Management Care Management Coordinator Direct Line [phone] Cell [phone] Toll Free: [phone]  Fax: [phone]

## 2019-10-14 ENCOUNTER — Ambulatory Visit: Payer: Self-pay

## 2019-10-16 ENCOUNTER — Other Ambulatory Visit: Payer: Self-pay

## 2019-10-16 ENCOUNTER — Ambulatory Visit: Payer: Medicare HMO | Admitting: Podiatry

## 2019-10-16 ENCOUNTER — Encounter: Payer: Self-pay | Admitting: Podiatry

## 2019-10-16 DIAGNOSIS — B351 Tinea unguium: Secondary | ICD-10-CM | POA: Diagnosis not present

## 2019-10-16 DIAGNOSIS — M79674 Pain in right toe(s): Secondary | ICD-10-CM

## 2019-10-16 DIAGNOSIS — M79675 Pain in left toe(s): Secondary | ICD-10-CM

## 2019-10-16 DIAGNOSIS — E1169 Type 2 diabetes mellitus with other specified complication: Secondary | ICD-10-CM | POA: Diagnosis not present

## 2019-10-16 NOTE — Progress Notes (Signed)
This patient returns to my office for at risk foot care.  This patient requires this care by a professional since this patient will be at risk due to having CKD stage 3 and type 2 diabetes.   This patient is unable to cut nails herself since the patient cannot reach her nails.These nails are painful walking and wearing shoes.  This patient presents for at risk foot care today.  General Appearance  Alert, conversant and in no acute stress.  Vascular  Dorsalis pedis and posterior tibial  pulses are palpable  bilaterally.  Capillary return is within normal limits  bilaterally. Temperature is within normal limits  bilaterally.  Neurologic  Senn-Weinstein monofilament wire test within normal limits  bilaterally. Muscle power within normal limits bilaterally.  Nails Thick disfigured discolored nails with subungual debris  from hallux to fifth toes bilaterally. No evidence of bacterial infection or drainage bilaterally.  Orthopedic  No limitations of motion  feet .  No crepitus or effusions noted.  No bony pathology or digital deformities noted.  HAV  B/L and tailors bunion  B/L.  Skin  normotropic skin with no porokeratosis noted bilaterally.  No signs of infections or ulcers noted.     Onychomycosis  Pain in right toes  Pain in left toes  Consent was obtained for treatment procedures.   Mechanical debridement of nails 1-5  bilaterally performed with a nail nipper.  Filed with dremel without incident.    Return office visit     3 months                 Told patient to return for periodic foot care and evaluation due to potential at risk complications.   Mirha Brucato DPM  

## 2019-10-22 ENCOUNTER — Other Ambulatory Visit: Payer: Self-pay

## 2019-10-23 MED ORDER — ROSUVASTATIN CALCIUM 20 MG PO TABS: 20.0000 mg | ORAL_TABLET | Freq: Every day | ORAL | 0 refills | Status: DC

## 2019-10-29 ENCOUNTER — Encounter: Payer: Self-pay | Admitting: Family Medicine

## 2019-10-29 DIAGNOSIS — H43811 Vitreous degeneration, right eye: Secondary | ICD-10-CM | POA: Diagnosis not present

## 2019-10-29 DIAGNOSIS — H5022 Vertical strabismus, left eye: Secondary | ICD-10-CM | POA: Diagnosis not present

## 2019-10-29 DIAGNOSIS — H5000 Unspecified esotropia: Secondary | ICD-10-CM | POA: Diagnosis not present

## 2019-10-29 DIAGNOSIS — E119 Type 2 diabetes mellitus without complications: Secondary | ICD-10-CM | POA: Diagnosis not present

## 2019-10-29 DIAGNOSIS — H25813 Combined forms of age-related cataract, bilateral: Secondary | ICD-10-CM | POA: Diagnosis not present

## 2019-10-29 LAB — HM DIABETES EYE EXAM

## 2019-11-04 ENCOUNTER — Ambulatory Visit: Payer: Medicare HMO | Admitting: Registered"

## 2019-11-11 ENCOUNTER — Telehealth: Payer: Self-pay | Admitting: Cardiovascular Disease

## 2019-11-11 DIAGNOSIS — R0609 Other forms of dyspnea: Secondary | ICD-10-CM

## 2019-11-11 DIAGNOSIS — E785 Hyperlipidemia, unspecified: Secondary | ICD-10-CM

## 2019-11-11 DIAGNOSIS — R079 Chest pain, unspecified: Secondary | ICD-10-CM

## 2019-11-11 DIAGNOSIS — E1169 Type 2 diabetes mellitus with other specified complication: Secondary | ICD-10-CM

## 2019-11-11 DIAGNOSIS — I119 Hypertensive heart disease without heart failure: Secondary | ICD-10-CM

## 2019-11-11 NOTE — Telephone Encounter (Signed)
Pt called to report that she was out raking and cutting shrubs this past Monday 11/09/19 and she started to get an "odd" feeling in her chest on the left side. She said it was hot outside and she feels she over exerted herself but then it happened again when she was sweeping. She says she stays very active doing these things but has been having this "odd " feeling so she is worried about continuing to exert herself until she knows if it is her heart.   Pt also reports that last night she had a brief episode of feeling her heart beating fast and hard but subsided on it's own. She says she was resting at the time.  I have advised the pt to continue to monitor... she will take it easy the next few days. I attempted to make her an appt but unable to find a spot for her prior to 11/2019.   Will forward to Dr. Oval Linsey for her review and recommendations.

## 2019-11-11 NOTE — Telephone Encounter (Signed)
New Message   Pt c/o of Chest Pain: STAT if CP now or developed within 24 hours  1. Are you having CP right now? Pt says not pain but she feels a funny feeling in her heart area. She notices this when doing activity like racking the yard   2. Are you experiencing any other symptoms (ex. SOB, nausea, vomiting, sweating)? No   3. How long have you been experiencing CP? She says it has been happening in time but it is getting more this week   4. Is your CP continuous or coming and going? Come and go   5. Have you taken Nitroglycerin? No, not recently. She says she did use it many years ago and it did help and she would like to have it again   Pt says she thinks she should be seen before her yearly follow up in October   Please call  ?

## 2019-11-12 NOTE — Telephone Encounter (Signed)
Recommend getting a Lexiscan myoview. If she thinks she can exercise she can do an exercise myoview. Please get a follow up after the test.

## 2019-11-12 NOTE — Telephone Encounter (Signed)
Left message to call back  

## 2019-11-13 NOTE — Telephone Encounter (Signed)
Advised patient, verbalized understanding  

## 2019-11-13 NOTE — Telephone Encounter (Signed)
Follow up ° ° °Patient is returning your call. Please call. ° ° ° °

## 2019-11-16 ENCOUNTER — Telehealth: Payer: Self-pay | Admitting: Cardiovascular Disease

## 2019-11-16 NOTE — Telephone Encounter (Signed)
Left message for patient to call and schedule Exercise myoview ordered by Dr. Oval Linsey

## 2019-11-19 NOTE — Telephone Encounter (Signed)
Spoke with patient regarding appointment for exercise myoview scheduled Wednesday 11/25/19 at 12:30 pm here at [facility].  Informed patient one of the technicians will call her with instructions.  She voiced her understanding.

## 2019-11-24 ENCOUNTER — Telehealth (HOSPITAL_COMMUNITY): Payer: Self-pay

## 2019-11-24 NOTE — Telephone Encounter (Signed)
Encounter complete. 

## 2019-11-25 ENCOUNTER — Ambulatory Visit (HOSPITAL_COMMUNITY)
Admission: RE | Admit: 2019-11-25 | Discharge: 2019-11-25 | Disposition: A | Discharge status: Home / Self Care | Payer: Medicare HMO | Source: Ambulatory Visit | Attending: Cardiology | Admitting: Cardiology

## 2019-11-25 ENCOUNTER — Other Ambulatory Visit: Payer: Self-pay

## 2019-11-25 DIAGNOSIS — R079 Chest pain, unspecified: Secondary | ICD-10-CM | POA: Insufficient documentation

## 2019-11-25 DIAGNOSIS — R000 Tachycardia, unspecified: Secondary | ICD-10-CM | POA: Diagnosis not present

## 2019-11-25 DIAGNOSIS — E785 Hyperlipidemia, unspecified: Secondary | ICD-10-CM | POA: Insufficient documentation

## 2019-11-25 DIAGNOSIS — R002 Palpitations: Secondary | ICD-10-CM | POA: Insufficient documentation

## 2019-11-25 DIAGNOSIS — E1169 Type 2 diabetes mellitus with other specified complication: Secondary | ICD-10-CM | POA: Insufficient documentation

## 2019-11-25 DIAGNOSIS — I119 Hypertensive heart disease without heart failure: Secondary | ICD-10-CM | POA: Insufficient documentation

## 2019-11-25 LAB — MYOCARDIAL PERFUSION IMAGING
LV dias vol: 83 mL (ref 46–106)
LV sys vol: 35 mL
Peak HR: 72 {beats}/min
Rest HR: 43 {beats}/min
SDS: 0
SRS: 0
SSS: 0
TID: 0.93

## 2019-11-25 MED ORDER — TECHNETIUM TC 99M TETROFOSMIN IV KIT
30.6000 | PACK | Freq: Once | INTRAVENOUS | Status: AC | PRN
  Administered 2019-11-25: 30.6 via INTRAVENOUS
  Filled 2019-11-25: qty 31

## 2019-11-25 MED ORDER — AMINOPHYLLINE 25 MG/ML IV SOLN
75.0000 mg | Freq: Once | INTRAVENOUS | Status: AC
  Administered 2019-11-25: 75 mg via INTRAVENOUS

## 2019-11-25 MED ORDER — TECHNETIUM TC 99M TETROFOSMIN IV KIT
10.4000 | PACK | Freq: Once | INTRAVENOUS | Status: AC | PRN
  Administered 2019-11-25: 10.4 via INTRAVENOUS
  Filled 2019-11-25: qty 11

## 2019-11-25 MED ORDER — REGADENOSON 0.4 MG/5ML IV SOLN
0.4000 mg | Freq: Once | INTRAVENOUS | Status: AC
  Administered 2019-11-25: 0.4 mg via INTRAVENOUS

## 2019-12-03 ENCOUNTER — Telehealth: Payer: Self-pay | Admitting: Cardiovascular Disease

## 2019-12-03 NOTE — Telephone Encounter (Signed)
    Pt is returning call from [place] to get stress test results

## 2019-12-03 NOTE — Telephone Encounter (Signed)
LVM2CB 8/5

## 2019-12-04 NOTE — Telephone Encounter (Signed)
Patient called in to office regarding her stress test results. Patient was informed that those were normal and she verbalized understanding. Patient requested some heart healthy eating options or diet plan be sent to her through the mail. Heart healthy diet information will be sent to patients current address listed. Patient also expressed that she would be back in office for follow up in October of this year.   All questions answered and patient verbalized understanding.

## 2019-12-04 NOTE — Telephone Encounter (Signed)
Follow up    Returning a call to the nurse from yesterday

## 2019-12-05 ENCOUNTER — Other Ambulatory Visit: Payer: Self-pay | Admitting: Family Medicine

## 2019-12-21 ENCOUNTER — Encounter: Payer: Self-pay | Admitting: Dietician

## 2019-12-21 ENCOUNTER — Encounter: Payer: Medicare HMO | Attending: Family Medicine | Admitting: Dietician

## 2019-12-21 ENCOUNTER — Other Ambulatory Visit: Payer: Self-pay

## 2019-12-21 DIAGNOSIS — E1169 Type 2 diabetes mellitus with other specified complication: Secondary | ICD-10-CM | POA: Insufficient documentation

## 2019-12-21 NOTE — Patient Instructions (Addendum)
Plan: Ask your MD about vitamin D Consider making an appointment with Dr. Halford Chessman regarding your sleep Consider taking Glucerna or Boost Glucose Control twice daily. Consider counseling to help with stress. Continue to stay active Consider less greasy foods Consider less salt Consider snack options rather than cookies and candy.  Aim for 4 Carb Choices per meal (60 grams) +/- 1 either way  Aim for 0-1 Carbs per snack if hungry  Include protein in moderation with your meals and snacks Consider reading food labels for Total Carbohydrate of foods Consider checking BG at alternate times per day  Consider taking medication as directed by MD  Book-"My Journey to Victory: A Memoir: Overcoming Grief and Life's Challenges" (available on [place]) by Jac Canavan - a New MD at [facility]

## 2019-12-21 NOTE — Progress Notes (Signed)
Diabetes Self-Management Education  Visit Type: First/Initial  Appt. Start Time: 1410 Appt. End Time: 9323  12/21/2019  [name], identified by name and date of birth, is a 74 y.o. female with a diagnosis of Diabetes: Type 2.   ASSESSMENT Patient is here today alone.  She states that she needs to get better control of her diabetes. She states that she does not know what to eat.  She states that she has mood swings and feels that she needs to exercise all of the time to feel good.  She states that she only has energy when she exercises.  She would like to regain her weight.  She has lost from 111 lbs 07/28/2019 to 105 lbs today.  115 lbs about 8 years ago. 143 lbs highest adult weight about Jun 17, 2004.  History includes Type 2 Diabetes Jun 17, 2006), OSA (not on c-pap now and states that she may get this reevaluated).  She sleeps about 5-6 hours per night and wakes feeling that she has "been in a train wreck".  Her sleep is broken (8-10 pm, 12-2 pm, then asleep a little later).  She denies sleeping during the day. Medications include Jardiance, Metformin XR A1C 8.8% 07/28/2019, eGFR 46 07/2019 She is to see an MD at [facility] in September She does not check her blood sugar as her numbers are high and this frightens her.  She would like [employer] but this is not covered by insurance.  Allergies include:  Black pepper, spices (cinnamon, cloves, chili powder, others), hot peppers (hot/feverish) She has tried Glucerna and would like to try this again.  Patient lives alone. Her mother used to live with her but she passed away in 06-17-10.  Patient reports increased stress of living alone and with the stress of the world.  She reports having support but is willing to enquire about counseling.  Height 5' 1" (1.549 m), weight 105 lb (47.6 kg). Body mass index is 19.84 kg/m.   Diabetes Self-Management Education - 12/21/19 1433      Visit Information   Visit Type First/Initial       Initial Visit   Diabetes Type Type 2    Are you currently following a meal plan? No    Are you taking your medications as prescribed? Yes    Date Diagnosed 06/17/2006      Health Coping   How would you rate your overall health? Good      Psychosocial Assessment   Patient Belief/Attitude about Diabetes Motivated to manage diabetes    Self-care barriers None    Self-management support Doctor's office;Family    Other persons present Patient    Patient Concerns Nutrition/Meal planning;Glycemic Control;Healthy Lifestyle    Special Needs None    Preferred Learning Style No preference indicated    Learning Readiness Not Ready    How often do you need to have someone help you when you read instructions, pamphlets, or other written materials from your doctor or pharmacy? 1 - Never    What is the last grade level you completed in school? 12      Pre-Education Assessment   Patient understands the diabetes disease and treatment process. Needs Instruction    Patient understands incorporating nutritional management into lifestyle. Needs Instruction    Patient undertands incorporating physical activity into lifestyle. Needs Instruction    Patient understands using medications safely. Needs Instruction    Patient understands monitoring blood glucose, interpreting and using results Needs Instruction  Patient understands prevention, detection, and treatment of acute complications. Needs Instruction    Patient understands prevention, detection, and treatment of chronic complications. Needs Instruction    Patient understands how to develop strategies to address psychosocial issues. Needs Instruction    Patient understands how to develop strategies to promote health/change behavior. Needs Instruction      Complications   Last HgB A1C per patient/outside source 8.8 %   07/28/2019 decreased   How often do you check your blood sugar? 0 times/day (not testing)   afraid of the high numbers   Number of  hypoglycemic episodes per month 0    Have you had a dilated eye exam in the past 12 months? Yes    Have you had a dental exam in the past 12 months? Yes    Are you checking your feet? Yes    How many days per week are you checking your feet? 7      Dietary Intake   Breakfast 1/2 banana, 6 oz skim milk, old fashioned oatmeal with skim milk, olive oil, sugar sub, egg, occasional 1/2 cup OJ   8:30   Snack (morning) occasional ritz crackers and PB    Lunch egg biscuit   11:30   Snack (afternoon) ritz crackers and PB or vanilla wafer    Dinner canned chicken noodle soup, okra, hamburger on a bun OR vegetable stew AND occasional cookie or ice cream sandwich    Snack (evening) cookies, fun size candy bar    Beverage(s) water, diet gingerale, skim milk, occasional OJ      Exercise   Exercise Type Light (walking / raking leaves)   walking plus gardening   How many days per week to you exercise? 7    How many minutes per day do you exercise? 30    Total minutes per week of exercise 210      Patient Education   Previous Diabetes Education Yes (please comment)   televisit 07/2018 with Bev, RD, CDCES   Disease state  Definition of diabetes, type 1 and 2, and the diagnosis of diabetes;Explored patient's options for treatment of their diabetes    Nutrition management  Role of diet in the treatment of diabetes and the relationship between the three main macronutrients and blood glucose level;Food label reading, portion sizes and measuring food.;Meal options for control of blood glucose level and chronic complications.    Physical activity and exercise  Role of exercise on diabetes management, blood pressure control and cardiac health.    Medications Reviewed patients medication for diabetes, action, purpose, timing of dose and side effects.    Monitoring Purpose and frequency of SMBG.;Identified appropriate SMBG and/or A1C goals.;Daily foot exams;Yearly dilated eye exam    Acute complications Taught  treatment of hypoglycemia - the 15 rule.    Chronic complications Relationship between chronic complications and blood glucose control    Psychosocial adjustment Worked with patient to identify barriers to care and solutions;Role of stress on diabetes;Identified and addressed patients feelings and concerns about diabetes      Individualized Goals (developed by patient)   Nutrition General guidelines for healthy choices and portions discussed    Physical Activity Exercise 5-7 days per week;30 minutes per day    Medications take my medication as prescribed    Monitoring  test my blood glucose as discussed    Reducing Risk increase portions of healthy fats;examine blood glucose patterns    Health Coping discuss diabetes with (comment)   MD, RD,  CDCES     Post-Education Assessment   Patient understands the diabetes disease and treatment process. Demonstrates understanding / competency    Patient understands incorporating nutritional management into lifestyle. Needs Review    Patient undertands incorporating physical activity into lifestyle. Demonstrates understanding / competency    Patient understands using medications safely. Demonstrates understanding / competency    Patient understands monitoring blood glucose, interpreting and using results Demonstrates understanding / competency    Patient understands prevention, detection, and treatment of acute complications. Demonstrates understanding / competency    Patient understands prevention, detection, and treatment of chronic complications. Demonstrates understanding / competency    Patient understands how to develop strategies to address psychosocial issues. Needs Review    Patient understands how to develop strategies to promote health/change behavior. Needs Review      Outcomes   Expected Outcomes Demonstrated interest in learning. Expect positive outcomes    Future DMSE 4-6 wks    Program Status Not Completed           Individualized  Plan for Diabetes Self-Management Training:   Learning Objective:  Patient will have a greater understanding of diabetes self-management. Patient education plan is to attend individual and/or group sessions per assessed needs and concerns.   Plan:   Patient Instructions  Plan: Ask your MD about vitamin D Consider making an appointment with Dr. Sood regarding your sleep Consider taking Glucerna or Boost Glucose Control twice daily. Consider counseling to help with stress. Continue to stay active Consider less greasy foods Consider less salt Consider snack options rather than cookies and candy.  Aim for 4 Carb Choices per meal (60 grams) +/- 1 either way  Aim for 0-1 Carbs per snack if hungry  Include protein in moderation with your meals and snacks Consider reading food labels for Total Carbohydrate of foods Consider checking BG at alternate times per day  Consider taking medication as directed by MD  Book-"My Journey to Victory: A Memoir: Overcoming Grief and Life's Challenges" (available on [employer]) by Onyeje Ijaola - a New MD at [facility]      Expected Outcomes:  Demonstrated interest in learning. Expect positive outcomes  Education material provided: ADA - How to Thrive: A Guide for Your Journey with Diabetes, Meal plan card and Snack sheet  If problems or questions, patient to contact team via:  Phone  Future DSME appointment: 4-6 wks   

## 2019-12-25 ENCOUNTER — Other Ambulatory Visit: Payer: Self-pay | Admitting: Family Medicine

## 2019-12-29 NOTE — Telephone Encounter (Signed)
LMOVM for pt to call back and make an appt.Dabney Dever Kennon Holter, CMA

## 2019-12-29 NOTE — Telephone Encounter (Signed)
Please ask patient to schedule a follow up visit with me, thanks! Denyce Harr J Amna Welker, MD  

## 2019-12-31 ENCOUNTER — Telehealth: Payer: Self-pay | Admitting: Dietician

## 2019-12-31 NOTE — Telephone Encounter (Signed)
Patient called stating that she lost her meal plan card.  She also stated that she would like to gain back 5 lbs.    Returned patient call.  Discussed this briefly and recommended that she start the Glucerna again. Continue to take your medications as prescribed. Will mail the meal plan card.  Patient has an appointment with me in 6 weeks.  Antonieta Iba, RD, LDN, CDCES

## 2020-01-07 ENCOUNTER — Other Ambulatory Visit: Payer: Self-pay | Admitting: Pharmacy Technician

## 2020-01-07 NOTE — Patient Outreach (Signed)
[facility]) Care Management  01/07/2020  Mallory Hayes Jul 17, 1945 [id number]   Unsuccessful outreach call placed to patient in regards to voicemail she left inquiring about the phone number to call in her refill request for Jardiance to [facility].  Unfortunately, patient did not answer the phone. HIPAA compliant voicemail left.  Will await return call.  [name], [place]  [phone]

## 2020-01-12 ENCOUNTER — Other Ambulatory Visit: Payer: Self-pay

## 2020-01-12 NOTE — Patient Outreach (Signed)
[facility]) Care Management  [place]  01/12/2020   Mallory Hayes [birth year] [id number]  Subjective: Telephone call to patient for quarterly disease management. Patient has not seen physician for recent A1c but due to go on Thursday. Patient reports that she does not check sugar often but when she does it is 145-150.  Discussed blood sugar management and diet.  Patient also seeing nutritionist and states that helps her too.  Advised patient that eating things in moderation also helps sugar. She verbalized understanding.  Objective:   Encounter Medications:  Outpatient Encounter Medications as of 01/12/2020  Medication Sig  . ACCU-CHEK SOFTCLIX LANCETS lancets USE TO CHECK BLOOD SUGAR ONCE DAILY (Patient taking differently: 1 each by Other route daily. Use to check blood sugar once daily)  . Alcohol Swabs (B-D SINGLE USE SWABS REGULAR) PADS Use to check blood sugar once daily  . amLODipine (NORVASC) 10 MG tablet TAKE 1 TABLET BY MOUTH EVERY DAY  . aspirin 81 MG tablet Take 81 mg by mouth daily.    . Blood Glucose Calibration (ACCU-CHEK AVIVA) SOLN USE AS DIRECTED AS NEEDED TO CALIBRATE GLUCOMETER  . Blood Glucose Monitoring Suppl (ACCU-CHEK AVIVA PLUS) w/Device KIT USE TO CHECK BLOOD SUGAR ONCE DAILY (Patient taking differently: See admin instructions. Use to check blood sugar once daily)  . Blood Pressure Monitoring (BLOOD PRESSURE MONITOR AUTOMAT) DEVI Check blood pressure 3 times per week  . Calcium Carbonate Antacid (TUMS PO) Take by mouth.  Regino Schultze Bandages & Supports (WRIST SPLINT/COCK-UP/LEFT M) MISC Wear daily  . empagliflozin (JARDIANCE) 10 MG TABS tablet Take 10 mg by mouth daily.  . fluticasone (FLONASE) 50 MCG/ACT nasal spray Place 2 sprays into both nostrils daily.  . furosemide (LASIX) 20 MG tablet TAKE 1 TABLET BY MOUTH EVERY DAY  . lisinopril (ZESTRIL) 20 MG tablet TAKE 1 TABLET EVERY DAY  . metFORMIN (GLUCOPHAGE-XR) 500 MG 24 hr tablet Take 1  tablet (500 mg total) by mouth 2 (two) times daily.  . mometasone (NASONEX) 50 MCG/ACT nasal spray Place 2 sprays into the nose daily. (Patient not taking: Reported on 12/21/2019)  . Polyethylene Glycol 3350 (PEG 3350) 17 GM/SCOOP POWD Take 17g by mouth daily prn  . rosuvastatin (CRESTOR) 20 MG tablet Take 1 tablet (20 mg total) by mouth daily. (Patient not taking: Reported on 12/21/2019)  . vitamin E (E200) 200 UNIT capsule Take by mouth.   No facility-administered encounter medications on file as of 01/12/2020.    Functional Status:  In your present state of health, do you have any difficulty performing the following activities: 01/12/2020 08/14/2019  Hearing? N N  Vision? N N  Difficulty concentrating or making decisions? N N  Walking or climbing stairs? N N  Dressing or bathing? N N  Doing errands, shopping? N N  Preparing Food and eating ? N N  Using the Toilet? N N  In the past six months, have you accidently leaked urine? N N  Do you have problems with loss of bowel control? N N  Managing your Medications? N N  Managing your Finances? N N  Housekeeping or managing your Housekeeping? N N  Some recent data might be hidden    Fall/Depression Screening: Fall Risk  01/12/2020 12/21/2019 09/14/2019  Falls in the past year? 0 0 0  Number falls in past yr: - - 0  Injury with Fall? - - -  Risk Factor Category  - - -  Risk for fall due to : - - -  Follow up - - Falls evaluation completed   PHQ 2/9 Scores 01/12/2020 12/21/2019 09/14/2019 08/14/2019 07/28/2019 08/27/2018 06/30/2018  PHQ - 2 Score 0 0 0 _0 0  PHQ- 9 Score - - - - - - -    Assessment: Patient continue to manage chronic conditions and benefits from disease management.   Plan:  [facility] CM Care Plan Problem One     Most Recent Value  Care Plan Problem One Knowledge deficit related to diease management of diabetes  Role Documenting the Problem One Care Management Telephonic Coordinator  Care Plan for Problem One Active  [facility] Long  Term Goal  in 90 days the patient will decrease her a1c of 9.2 by 1-2 points  [facility] Long Term Goal Start Date 01/12/20     RN CM will outreach in December and patient agreeable.    [name], RN, MSN [facility] Care Management Coordinator Direct Line [phone] Cell [phone] Toll Free: [phone]  Fax: [phone]

## 2020-01-14 ENCOUNTER — Ambulatory Visit: Payer: Medicare HMO | Admitting: Family Medicine

## 2020-01-19 DIAGNOSIS — E1122 Type 2 diabetes mellitus with diabetic chronic kidney disease: Secondary | ICD-10-CM | POA: Diagnosis not present

## 2020-01-19 DIAGNOSIS — E119 Type 2 diabetes mellitus without complications: Secondary | ICD-10-CM | POA: Diagnosis not present

## 2020-01-19 DIAGNOSIS — N183 Chronic kidney disease, stage 3 unspecified: Secondary | ICD-10-CM | POA: Diagnosis not present

## 2020-01-19 DIAGNOSIS — I129 Hypertensive chronic kidney disease with stage 1 through stage 4 chronic kidney disease, or unspecified chronic kidney disease: Secondary | ICD-10-CM | POA: Diagnosis not present

## 2020-01-19 DIAGNOSIS — G4733 Obstructive sleep apnea (adult) (pediatric): Secondary | ICD-10-CM | POA: Diagnosis not present

## 2020-01-20 ENCOUNTER — Other Ambulatory Visit: Payer: Self-pay | Admitting: Family Medicine

## 2020-01-20 ENCOUNTER — Ambulatory Visit: Payer: Medicare HMO | Admitting: Family Medicine

## 2020-01-22 ENCOUNTER — Other Ambulatory Visit: Payer: Self-pay

## 2020-01-22 ENCOUNTER — Encounter: Payer: Self-pay | Admitting: Podiatry

## 2020-01-22 ENCOUNTER — Ambulatory Visit: Payer: Medicare HMO | Admitting: Podiatry

## 2020-01-22 DIAGNOSIS — M79674 Pain in right toe(s): Secondary | ICD-10-CM | POA: Diagnosis not present

## 2020-01-22 DIAGNOSIS — E1169 Type 2 diabetes mellitus with other specified complication: Secondary | ICD-10-CM | POA: Diagnosis not present

## 2020-01-22 DIAGNOSIS — B351 Tinea unguium: Secondary | ICD-10-CM | POA: Diagnosis not present

## 2020-01-22 DIAGNOSIS — M79675 Pain in left toe(s): Secondary | ICD-10-CM | POA: Diagnosis not present

## 2020-01-22 NOTE — Progress Notes (Signed)
This patient returns to my office for at risk foot care.  This patient requires this care by a professional since this patient will be at risk due to having CKD stage 3 and type 2 diabetes.   This patient is unable to cut nails herself since the patient cannot reach her nails.These nails are painful walking and wearing shoes.  This patient presents for at risk foot care today.  General Appearance  Alert, conversant and in no acute stress.  Vascular  Dorsalis pedis and posterior tibial  pulses are palpable  bilaterally.  Capillary return is within normal limits  bilaterally. Temperature is within normal limits  bilaterally.  Neurologic  Senn-Weinstein monofilament wire test within normal limits  bilaterally. Muscle power within normal limits bilaterally.  Nails Thick disfigured discolored nails with subungual debris  from hallux to fifth toes bilaterally. No evidence of bacterial infection or drainage bilaterally.  Orthopedic  No limitations of motion  feet .  No crepitus or effusions noted.  No bony pathology or digital deformities noted.  HAV  B/L and tailors bunion  B/L.  Skin  normotropic skin with no porokeratosis noted bilaterally.  No signs of infections or ulcers noted.     Onychomycosis  Pain in right toes  Pain in left toes  Consent was obtained for treatment procedures.   Mechanical debridement of nails 1-5  bilaterally performed with a nail nipper.  Filed with dremel without incident.    Return office visit     3 months                 Told patient to return for periodic foot care and evaluation due to potential at risk complications.   Congetta Odriscoll DPM  

## 2020-01-25 ENCOUNTER — Other Ambulatory Visit: Payer: Self-pay

## 2020-01-25 ENCOUNTER — Ambulatory Visit (INDEPENDENT_AMBULATORY_CARE_PROVIDER_SITE_OTHER): Payer: Medicare HMO

## 2020-01-25 ENCOUNTER — Telehealth: Payer: Self-pay

## 2020-01-25 DIAGNOSIS — Z0000 Encounter for general adult medical examination without abnormal findings: Secondary | ICD-10-CM

## 2020-01-25 NOTE — Telephone Encounter (Signed)
Patient presents to nurse clinic for flu shot and A1C check. Patient is scheduled with Dr. Higinio Plan on 10/6 for diabetes follow up. Advised patient that flu shot could be administered today, however, A1C would need to be drawn at office visit. Patient elects to have flu shot and A1C performed at next visit on 02/03/20.   Talbot Grumbling, RN

## 2020-01-28 ENCOUNTER — Other Ambulatory Visit: Payer: Self-pay

## 2020-01-28 ENCOUNTER — Ambulatory Visit (INDEPENDENT_AMBULATORY_CARE_PROVIDER_SITE_OTHER): Payer: Medicare HMO | Admitting: Cardiovascular Disease

## 2020-01-28 ENCOUNTER — Encounter: Payer: Self-pay | Admitting: Cardiovascular Disease

## 2020-01-28 ENCOUNTER — Telehealth: Payer: Self-pay | Admitting: *Deleted

## 2020-01-28 ENCOUNTER — Encounter: Payer: Self-pay | Admitting: *Deleted

## 2020-01-28 VITALS — BP 160/80 | HR 48 | Temp 97.0°F | Ht 61.0 in | Wt 104.0 lb

## 2020-01-28 DIAGNOSIS — I10 Essential (primary) hypertension: Secondary | ICD-10-CM | POA: Diagnosis not present

## 2020-01-28 DIAGNOSIS — I495 Sick sinus syndrome: Secondary | ICD-10-CM | POA: Diagnosis not present

## 2020-01-28 DIAGNOSIS — Z5181 Encounter for therapeutic drug level monitoring: Secondary | ICD-10-CM

## 2020-01-28 DIAGNOSIS — R001 Bradycardia, unspecified: Secondary | ICD-10-CM | POA: Diagnosis not present

## 2020-01-28 DIAGNOSIS — R5383 Other fatigue: Secondary | ICD-10-CM | POA: Diagnosis not present

## 2020-01-28 MED ORDER — OLMESARTAN-AMLODIPINE-HCTZ 40-10-12.5 MG PO TABS: ORAL_TABLET | ORAL | 1 refills | Status: DC

## 2020-01-28 NOTE — Telephone Encounter (Signed)
LMVM of instructions to apply 3 day ZIO XT long term holter monitor.  Asked for patient to review instructions on cardboard foldout once she receives her monitor.  It is very easy to apply yourself.  If you still do not feel comfortable applying yourself, please call Ashtan Laton in monitors at [phone] and we can set up a time for you to come into our [facility]. office next week to have monitor applied.

## 2020-01-28 NOTE — Progress Notes (Signed)
Cardiology Office Note   Date:  01/29/2020   ID:  Mallory Hayes, DOB 1945-08-02, MRN [id number]  PCP:  Mallory Rio, MD  Cardiologist:   Skeet Latch, MD   No chief complaint on file.     History of Present Illness: Mallory Hayes is a 74 y.o. female hypertension, hyperlipidemia, OSA on CPAP, and diabetes who presents for follow up.  She was seen 12/2015 for an evaluation of bradycardia. Mallory Hayes notes that her heart rate has been low for years.  She saw her PCP and was noted that her heart rate was in the 46. She was referred to cardiology for evaluation. At the time she was active and had no exertional symptoms. Echocardiogram revealed LVEF 55 to 60% with grade 1 diastolic dysfunction.  Mallory Hayes reported an episode of exertional chest pain while doing yardwork.  She had a [employer] 10/2019 that was negative for ischemia.  She notes that she usually feels good when she first wakes up.  However, after she takes her mediation she starts to feel poorly.  It "attacks her spirit and makes her feel down." She feels good when she is working outside in the garden or walking.  When she sweeps her floor repeatedly it causes a sensation in her L chest.  It isn't pain but it makes her feel funny.  She had an echo 10/219 that revealed LVEF 60-65% and grade 2 diastolic dysfunction.  She notes that she stopped taking rosuvastatin because she didn't want to take so many pills.   Past Medical History:  Diagnosis Date  . Diabetes mellitus without complication ([place])   . GERD (gastroesophageal reflux disease)   . OSA (obstructive sleep apnea)   . Sleep apnea     Past Surgical History:  Procedure Laterality Date  . APPENDECTOMY    . BREAST EXCISIONAL BIOPSY Right    benign  . CHOLECYSTECTOMY    . COLONOSCOPY  2011  . PARTIAL HYSTERECTOMY  1991     Current Outpatient Medications  Medication Sig Dispense Refill  . ACCU-CHEK SOFTCLIX LANCETS lancets USE TO CHECK BLOOD SUGAR ONCE  DAILY (Patient taking differently: 1 each by Other route daily. Use to check blood sugar once daily) 100 each 12  . Alcohol Swabs (B-D SINGLE USE SWABS REGULAR) PADS Use to check blood sugar once daily 100 each 3  . aspirin 81 MG tablet Take 81 mg by mouth daily.      . Blood Glucose Calibration (ACCU-CHEK AVIVA) SOLN USE AS DIRECTED AS NEEDED TO CALIBRATE GLUCOMETER 1 each 0  . Blood Glucose Monitoring Suppl (ACCU-CHEK AVIVA PLUS) w/Device KIT USE TO CHECK BLOOD SUGAR ONCE DAILY (Patient taking differently: See admin instructions. Use to check blood sugar once daily) 1 kit 0  . Blood Pressure Monitoring (BLOOD PRESSURE MONITOR AUTOMAT) DEVI Check blood pressure 3 times per week 1 Device 0  . Calcium Carbonate Antacid (TUMS PO) Take by mouth.    Water engineer Bandages & Supports (WRIST SPLINT/COCK-UP/LEFT M) MISC Wear daily 1 each 0  . empagliflozin (JARDIANCE) 10 MG TABS tablet Take 10 mg by mouth daily. (Patient taking differently: Take 25 mg by mouth daily. 1 Tablet Daily) 90 tablet 1  . fluticasone (FLONASE) 50 MCG/ACT nasal spray Place 2 sprays into both nostrils daily. 16 g 11  . metFORMIN (GLUCOPHAGE-XR) 500 MG 24 hr tablet Take 1 tablet (500 mg total) by mouth 2 (two) times daily. 180 tablet 3  . mometasone (NASONEX) 50 MCG/ACT nasal  spray Place 2 sprays into the nose daily. 17 g 12  . Polyethylene Glycol 3350 (PEG 3350) 17 GM/SCOOP POWD Take 17g by mouth daily prn 850 g 1  . rosuvastatin (CRESTOR) 20 MG tablet TAKE 1 TABLET EVERY DAY 90 tablet 0  . vitamin E (E200) 200 UNIT capsule Take by mouth.    . Olmesartan-amLODIPine-HCTZ 40-10-12.5 MG TABS TAKE 1 TABLET BY MOUTH DAILY 90 tablet 1   No current facility-administered medications for this visit.    Allergies:   Amaryl [glimepiride], Food, and Sulfamethoxazole    Social History:  The patient  reports that she has never smoked. She has never used smokeless tobacco. She reports that she does not drink alcohol and does not use drugs.    Family History:  The patient's family history includes Cancer in her father and mother; Colon cancer (age of onset: 66) in her mother; Diabetes in her brother; Diabetes (age of onset: 65) in her mother; Hyperlipidemia in her mother; Hypertension in her mother; Ovarian cancer in her mother; Prostatitis in her brother.    ROS:  Please see the history of present illness.   Otherwise, review of systems are positive for none.   All other systems are reviewed and negative.    PHYSICAL EXAM: VS:  BP (!) 160/80   Pulse (!) 48   Temp (!) 97 F (36.1 C)   Ht _0  (1.549 m)   Wt 104 lb (47.2 kg)   SpO2 98%   BMI 19.65 kg/m  , BMI Body mass index is 19.65 kg/m. GENERAL:  Well appearing HEENT: Pupils equal round and reactive, fundi not visualized, oral mucosa unremarkable NECK:  No jugular venous distention, waveform within normal limits, carotid upstroke brisk and symmetric, no bruits LUNGS:  Clear to auscultation bilaterally HEART:  Bradycardic.  Regular rhythm.  PMI not displaced or sustained,S1 and S2 within normal limits, no S3, no S4, no clicks, no rubs, II/VI systolic murmur ABD:  Flat, positive bowel sounds normal in frequency in pitch, no bruits, no rebound, no guarding, no midline pulsatile mass, no hepatomegaly, no splenomegaly EXT:  2 plus pulses throughout, no edema, no cyanosis no clubbing SKIN:  No rashes no nodules NEURO:  Cranial nerves II through XII grossly intact, motor grossly intact throughout PSYCH:  Cognitively intact, oriented to person place and time  EKG:  EKG is ordered today. The ekg ordered today demonstrates sinus bradycardia.  Rate 49 bpm.  01/27/16: Sinus bradycardia. Rate 48 bpm.  Lexiscan Myoview 11/25/19:  There were transient T wave changes in the inferolateral leads during infusion that rapidly reolved in recovery  Nuclear stress EF: 58%.  The left ventricular ejection fraction is normal (55-65%).  The study is normal.  This is a low risk  study.  Echo 02/03/2018: Study Conclusions   - Left ventricle: The cavity size was normal. Wall thickness was  normal. Systolic function was normal. The estimated ejection  fraction was in the range of 60% to 65%. Wall motion was normal;  there were no regional wall motion abnormalities. Features are  consistent with a pseudonormal left ventricular filling pattern,  with concomitant abnormal relaxation and increased filling  pressure (grade 2 diastolic dysfunction).  - Aortic valve: Mildly calcified annulus. There was mild stenosis.  Valve area (VTI): 1.52 cm^2. Valve area (Vmax): 1.54 cm^2. Valve  area (Vmean): 1.65 cm^2.  - Mitral valve: There was mild regurgitation.  Recent Labs: 02/27/2019: ALT 16; Hemoglobin 13.1; Platelets 188; TSH 1.770 08/06/2019: BUN 24;  Creatinine, Ser 1.33; Potassium 3.8; Sodium 140    Lipid Panel    Component Value Date/Time   CHOL 169 08/06/2019 1042   TRIG 37 08/06/2019 1042   HDL 86 08/06/2019 1042   CHOLHDL 2.0 08/06/2019 1042   CHOLHDL 1.8 12/02/2014 0952   VLDL 12 12/02/2014 0952   LDLCALC 75 08/06/2019 1042   LDLDIRECT 62 07/28/2012 0927      Wt Readings from Last 3 Encounters:  01/28/20 104 lb (47.2 kg)  12/21/19 105 lb (47.6 kg)  11/25/19 110 lb (49.9 kg)      ASSESSMENT AND PLAN:  # Hypertension: Blood pressure is poorly controlled. It seems that she feels worse after taking her medications. I am not sure what is causing this. She struggles with polypharmacy. We are going to stop amlodipine, furosemide, and lisinopril. We will start her on Tribenzor 40/10/12 0.5 mg. She will need a basic metabolic panel in a week. Of asked her to check her blood pressures at home and bring it to follow-up.  # Bradycardia: # Fatigue:  Ms. Magloire is reporting fatigue at rest that improves with exertion. Her bradycardia may be contributing to her fatigue and it improves when she gets up and increases her heart rate with exertion. We will  have her wear a 3-day monitor to assess for correlation with symptoms.  # Hyperlipidemia: She stopped taking her rosuvastatin due to polypharmacy. She is willing to start back taking it again. LDL was 75 on 07/2019.   Current medicines are reviewed at length with the patient today.  The patient does not have concerns regarding medicines.  The following changes have been made:  no change  Labs/ tests ordered today include:   Orders Placed This Encounter  Procedures  . Basic metabolic panel  . LONG TERM MONITOR (3-14 DAYS)     Disposition:   FU with Syesha Thaw C. Oval Linsey, MD, [facility] in 1 year    This note was written with the assistance of speech recognition software.  Please excuse any transcriptional errors.  Signed, Jeremey Bascom C. Oval Linsey, [place], [facility]  01/29/2020 1:17 PM    [place]

## 2020-01-28 NOTE — Progress Notes (Signed)
Patient ID: Mallory Hayes, female   DOB: 01-05-1946, 74 y.o.   MRN: [id number] Patient enrolled for [employer] to ship a 3 day ZIO XT long term holter monitor to her home.

## 2020-01-28 NOTE — Patient Instructions (Addendum)
Medication Instructions:  STOP AMLODIPINE  STOP FUROSEMIDE  STOP LISINOPRIL   START OLMESARTAN-AMLODIPINE-HCTZ  40-10-12.5 MG DAILY   *If you need a refill on your cardiac medications before your next appointment, please call your pharmacy*   Lab Work: BMET IN 1 WEEK   If you have labs (blood work) drawn today and your tests are completely normal, you will receive your results only by: Mallory Hayes MyChart Message (if you have MyChart) OR . A paper copy in the mail If you have any lab test that is abnormal or we need to change your treatment, we will call you to review the results.   Testing/Procedures: 3 DAY ZIO MONITOR   Follow-Up: At [facility], you and your health needs are our priority.  As part of our continuing mission to provide you with exceptional heart care, we have created designated Provider Care Teams.  These Care Teams include your primary Cardiologist (physician) and Advanced Practice Providers (APPs -  Physician Assistants and Nurse Practitioners) who all work together to provide you with the care you need, when you need it.  We recommend signing up for the patient portal called "MyChart".  Sign up information is provided on this After Visit Summary.  MyChart is used to connect with patients for Virtual Visits (Telemedicine).  Patients are able to view lab/test results, encounter notes, upcoming appointments, etc.  Non-urgent messages can be sent to your provider as well.   To learn more about what you can do with MyChart, go to [email or web address].    Your next appointment:   6 week(s)  The format for your next appointment:   In Person  Provider:   You may see Skeet Latch, MD or one of the following Advanced Practice Providers on your designated Care Team:    Kerin Ransom, PA-C  [place], [place]  Coletta Memos, [place]  [place] Monitor Instructions   Your physician has requested you wear your ZIO patch monitor__3__days.   This is a single  patch monitor.  [employer] supplies one patch monitor per enrollment.  Additional stickers are not available.   Please do not apply patch if you will be having a Nuclear Stress Test, Echocardiogram, Cardiac CT, MRI, or Chest Xray during the time frame you would be wearing the monitor. The patch cannot be worn during these tests.  You cannot remove and re-apply the ZIO XT patch monitor.   Your ZIO patch monitor will be sent USPS Priority mail from [facility] directly to your home address. The monitor may also be mailed to a PO BOX if home delivery is not available.   It may take 3-5 days to receive your monitor after you have been enrolled.   Once you have received you monitor, please review enclosed instructions.  Your monitor has already been registered assigning a specific monitor serial # to you.   Applying the monitor   Shave hair from upper left chest.   Hold abrader disc by orange tab.  Rub abrader in 40 strokes over left upper chest as indicated in your monitor instructions.   Clean area with 4 enclosed alcohol pads .  Use all pads to assure are is cleaned thoroughly.  Let dry.   Apply patch as indicated in monitor instructions.  Patch will be place under collarbone on left side of chest with arrow pointing upward.   Rub patch adhesive wings for 2 minutes.Remove white label marked "1".  Remove white label marked "2".  Rub patch adhesive wings  for 2 additional minutes.   While looking in a mirror, press and release button in center of patch.  A small green light will flash 3-4 times .  This will be your only indicator the monitor has been turned on.     Do not shower for the first 24 hours.  You may shower after the first 24 hours.   Press button if you feel a symptom. You will hear a small click.  Record Date, Time and Symptom in the Patient Log Book.   When you are ready to remove patch, follow instructions on last 2 pages of Patient Log Book.  Stick patch monitor onto last  page of Patient Log Book.   Place Patient Log Book in [place] box.  Use locking tab on box and tape box closed securely.  The [employer] has [employer] on it.  Please place in mailbox as soon as possible.  Your physician should have your test results approximately 7 days after the monitor has been mailed back to [facility].   Call Jack at [phone] if you have questions regarding your ZIO XT patch monitor.  Call them immediately if you see an orange light blinking on your monitor.   If your monitor falls off in less than 4 days contact our Monitor department at [phone].  If your monitor becomes loose or falls off after 4 days call [employer] at [phone] for suggestions on securing your monitor.

## 2020-01-29 ENCOUNTER — Encounter: Payer: Self-pay | Admitting: Cardiovascular Disease

## 2020-02-01 ENCOUNTER — Telehealth: Payer: Self-pay | Admitting: Cardiovascular Disease

## 2020-02-01 ENCOUNTER — Telehealth: Payer: Self-pay

## 2020-02-01 MED ORDER — AMLODIPINE BESYLATE 10 MG PO TABS: 10.0000 mg | ORAL_TABLET | Freq: Every day | ORAL | 5 refills | Status: DC

## 2020-02-01 NOTE — Telephone Encounter (Signed)
Left message to call back  

## 2020-02-01 NOTE — Telephone Encounter (Signed)
Spoke with patient and she would like to know which regimen Dr Oval Linsey would like her on She is willing to do each individually or just change the Lisinopril to Olmesartan  Each of the previous Rx's separately cost around $5 each Did refill her Amlodipine and advised to go back on her previous regimen until hear from Dr Oval Linsey Will forward to her for review

## 2020-02-01 NOTE — Telephone Encounter (Signed)
It is $40 at [facility] or [employer] at [facility] [employer]  If that isn't affordable, then amlodipine 10, and Valsartan 160/HCTZ 12.5 are both $9 at [facility]

## 2020-02-01 NOTE — Telephone Encounter (Signed)
Patient called stating the new medication that nurse told her about would be to expensive for her.  She wants to know if a prior-auth can be done for her.

## 2020-02-01 NOTE — Addendum Note (Signed)
Addended by: Merri Ray A on: 02/01/2020 03:42 PM   Modules accepted: Orders

## 2020-02-01 NOTE — Telephone Encounter (Signed)
Patient calls nurse line regarding new medication prescribed by cardiologist. Patient states that she was started on Olmesartan- amlodipine- HCTZ on 9/30, however, patient is unable to afford medication. Patient calls our office to see if PCP can order her amlodipine as she only has enough to last until Wednesday. This medication has been removed from medication list, as it was replaced with combination drug.   Advised patient to reach out to cardiology office for further information on medication management as they were the provider that changed the medication.   Patient also has follow up appointment with our office (Dr. Higinio Plan) on 10/6.   To PCP  Talbot Grumbling, RN

## 2020-02-01 NOTE — Telephone Encounter (Signed)
Spoke with patient, she was seen on 01/28/20 by Dr. Oval Linsey. New medication started Olmesartan-amLODIPine-HCTZ 40-10-12.5 MG is too expensive and she called PCP. PCP told her to ask for prior authorization. Will send message to primary nurse Rip Harbour and Dr. Oval Linsey to review options for prior auth or whether medication needs to be changed. Patient verbalized understanding.

## 2020-02-03 ENCOUNTER — Encounter: Payer: Self-pay | Admitting: Family Medicine

## 2020-02-03 ENCOUNTER — Ambulatory Visit: Payer: Medicare HMO | Admitting: Cardiovascular Disease

## 2020-02-03 ENCOUNTER — Ambulatory Visit (INDEPENDENT_AMBULATORY_CARE_PROVIDER_SITE_OTHER): Payer: Medicare HMO | Admitting: Family Medicine

## 2020-02-03 ENCOUNTER — Other Ambulatory Visit: Payer: Self-pay

## 2020-02-03 VITALS — BP 140/75 | HR 55 | Ht 59.0 in | Wt 104.8 lb

## 2020-02-03 DIAGNOSIS — I10 Essential (primary) hypertension: Secondary | ICD-10-CM | POA: Diagnosis not present

## 2020-02-03 DIAGNOSIS — L989 Disorder of the skin and subcutaneous tissue, unspecified: Secondary | ICD-10-CM | POA: Diagnosis not present

## 2020-02-03 DIAGNOSIS — E1169 Type 2 diabetes mellitus with other specified complication: Secondary | ICD-10-CM

## 2020-02-03 LAB — POCT GLYCOSYLATED HEMOGLOBIN (HGB A1C): HbA1c, POC (controlled diabetic range): 7.8 % — AB (ref 0.0–7.0)

## 2020-02-03 NOTE — Assessment & Plan Note (Addendum)
Improved, A1c 7.8 from 8.8 in 06/2019. Likely due to recent dietary changes with nutritionist, congratulated and provided encouragement. Will keep Metformin 500 mg BID (renally dosed max) and Jardiance as is for now with the expectations that her A1c will continue to improve with the above changes.  Will additionally place endocrinology referral per patient request, she understands that her glucose management currently is improved and may not have any significant changes.

## 2020-02-03 NOTE — Progress Notes (Signed)
    SUBJECTIVE:   CHIEF COMPLAINT / HPI: Follow-up hypertension  Ms. Bang is a 74 year old female presenting discussed the following:  Hypertension: Recently seen by her cardiologist who transition to amlodipine and valsartan/HCTZ.  She had difficulty affording the combination pill, however now has refills of the individual medications at her pharmacy which should be more affordable.  She has not picked them up yet.  She took her Norvasc this morning and took her previous lisinopril last night.   Type 2 diabetes: Has been uncontrolled chronically in the past.  She is taking Jardiance 25 mg and Metformin XR 500 BID.  She does not like how large the Metformin pills are but would like to continue this knowing it is a good medication for her.  She started working with a nutritionist last month which has been very helpful, she has been working towards eating more whole foods and a balanced diet.  She also has been drinking only water and avoiding sugary drinks.  Reports her nutritionist requested a referral to an endocrinologist for her diabetes.  She additionally would like to have an additional opinion to make sure that she is doing good in terms of her glucose control.  Skin lesion: Present for the past several months on her right inner thigh and would like this evaluated.  She feels like the area is getting bigger.  Not itchy or painful.  Worried it is cancer.  PERTINENT  PMH / PSH: Hypertension, OSA, type 2 diabetes, reflux, atrophic vaginitis, cardia, anxiety  OBJECTIVE:   BP 140/75   Pulse (!) 55   Ht 4\' 11"  (1.499 m)   Wt 104 lb 12.8 oz (47.5 kg)   SpO2 99%   BMI 21.17 kg/m   General: Alert, NAD HEENT: NCAT, MMM Lungs: No increased WOB  Abdomen: soft Msk: Moves all extremities spontaneously  Pelvic exam: Half centimeter flesh-colored open comedone appearing lesion present within right inguinal fold without any surrounding erythema, puslike drainage, or warmth to touch.  Nonpainful  however firm to palpation. Ext: Warm, dry, 2+ distal pulses  ASSESSMENT/PLAN:   Essential hypertension Poorly controlled, recently had regimen transition via her cardiologist however has not picked up new medications yet.  Recommended picking up prescribed medications including valsartan/HCTZ and amlodipine to start as soon as possible, will need BMP 1 week following initiation.   Type 2 diabetes mellitus Improved, A1c 7.8 from 8.8 in 06/2019. Likely due to recent dietary changes with nutritionist, congratulated and provided encouragement. Will keep Metformin 500 mg BID (renally dosed max) and Jardiance as is for now with the expectations that her A1c will continue to improve with the above changes.  Will additionally place endocrinology referral per patient request, she understands that her glucose management currently is improved and may not have any significant changes.  Skin lesion Chronic, present within right groin. Lesion appears consistent with an open comedone, could also consider pseudofolliculitis vs small cyst.  No s/sx of infection at this time.  Provided reassurance as does not appear consistent with a cancerous lesion, however given increased patient concern will proceed with removal/extraction at her convenience.    Schedule appointment at convenience for above concern, otherwise follow-up in the next 2-3 months with PCP for chronic conditions.  Patriciaann Clan, [place]

## 2020-02-03 NOTE — Assessment & Plan Note (Signed)
Poorly controlled, recently had regimen transition via her cardiologist however has not picked up new medications yet.  Recommended picking up prescribed medications including valsartan/HCTZ and amlodipine to start as soon as possible, will need BMP 1 week following initiation.

## 2020-02-03 NOTE — Assessment & Plan Note (Addendum)
Chronic, present within right groin. Lesion appears consistent with an open comedone, could also consider pseudofolliculitis vs small cyst.  No s/sx of infection at this time.  Provided reassurance as does not appear consistent with a cancerous lesion, however given increased patient concern will proceed with removal/extraction at her convenience.

## 2020-02-03 NOTE — Patient Instructions (Signed)
It was so wonderful to see you today.  Please make sure you pick up your blood pressure medicines and start taking those to control your blood pressure.  Please let your cardiologist know if these are too expensive.  Additionally congratulations on having your diabetes much better!  Your diet changes have certainly made a big impact on this.  I expect that your A1c will continue to look better we will recheck it in 3 months if you continue with this.  For now we will keep your medication as it is.   Lastly, you can either schedule an appointment in our dermatology clinic or follow-up with any provider in this office to discuss removing this area on your groin.

## 2020-02-04 NOTE — Telephone Encounter (Signed)
Cardiology office appears to be handling this Mallory Rio, MD

## 2020-02-05 MED ORDER — VALSARTAN-HYDROCHLOROTHIAZIDE 160-12.5 MG PO TABS: 1.0000 | ORAL_TABLET | Freq: Every day | ORAL | 3 refills | Status: DC

## 2020-02-05 NOTE — Telephone Encounter (Signed)
Advised patient, verbalized understanding  

## 2020-02-15 ENCOUNTER — Encounter: Payer: Medicare HMO | Attending: Family Medicine | Admitting: Dietician

## 2020-02-15 DIAGNOSIS — E1169 Type 2 diabetes mellitus with other specified complication: Secondary | ICD-10-CM | POA: Insufficient documentation

## 2020-02-16 ENCOUNTER — Telehealth: Payer: Self-pay | Admitting: Cardiovascular Disease

## 2020-02-16 NOTE — Telephone Encounter (Signed)
Will send this message to our monitor techs to further follow-up with the pt, in regards to assisting her with getting her heart monitor placed.

## 2020-02-16 NOTE — Telephone Encounter (Signed)
Pt called in and stated she rec'd Heart Monitor in the mail and would like to see when she could come by the office for someone to put it on?  She would like someone to put it on for her   Best number [phone]

## 2020-02-17 NOTE — Telephone Encounter (Signed)
LMVM- The monitor department is working remotely at this time.  We do have staff in the office on Mondays and Tuesdays.   If patient would like to come to our [facility] office to have her monitor applied, please call [phone]. ZIO XT monitor is very easy to apply, if you would like [place] to talk you through the application process, call us a [phone] or call [employer] directly at [phone]. They are available 24/7.

## 2020-02-18 ENCOUNTER — Telehealth: Payer: Self-pay | Admitting: Cardiovascular Disease

## 2020-02-18 NOTE — Telephone Encounter (Signed)
OK 

## 2020-02-18 NOTE — Telephone Encounter (Signed)
Patient would like for nurse to give her a call regarding a medicine. Please call back

## 2020-02-18 NOTE — Telephone Encounter (Signed)
Spoke with pt and pt  received new medicine in mail (Valsartan/HCTZ 160/12.5 mg ) and after reviewing potential side effects ( may cause cancer and loss of eyesight )Pt prefers to go back on what was taking previously and will watch salt intake .Will forward to Dr Oval Linsey for review .Adonis Housekeeper

## 2020-02-19 NOTE — Telephone Encounter (Signed)
Patient returning call.

## 2020-02-19 NOTE — Telephone Encounter (Signed)
Left a message for the patient to call back.  

## 2020-02-19 NOTE — Telephone Encounter (Signed)
Left message to call back  

## 2020-02-22 ENCOUNTER — Ambulatory Visit (INDEPENDENT_AMBULATORY_CARE_PROVIDER_SITE_OTHER): Payer: Medicare HMO

## 2020-02-22 ENCOUNTER — Other Ambulatory Visit: Payer: Self-pay

## 2020-02-22 DIAGNOSIS — R001 Bradycardia, unspecified: Secondary | ICD-10-CM | POA: Diagnosis not present

## 2020-02-22 DIAGNOSIS — R5383 Other fatigue: Secondary | ICD-10-CM | POA: Diagnosis not present

## 2020-02-22 NOTE — Telephone Encounter (Signed)
Patient returning Melinda's call

## 2020-02-22 NOTE — Telephone Encounter (Signed)
Left message to call back  

## 2020-03-01 ENCOUNTER — Other Ambulatory Visit: Payer: Self-pay | Admitting: Cardiovascular Disease

## 2020-03-01 NOTE — Telephone Encounter (Signed)
Left message to get update with pt re resuming previous meds  Pt has upcoming appt .cy

## 2020-03-01 NOTE — Telephone Encounter (Signed)
*  STAT* If patient is at the pharmacy, call can be transferred to refill team.   1. Which medications need to be refilled? (please list name of each medication and dose if known) lisinopril 28 mg  2. Which pharmacy/location (including street and city if local pharmacy) is medication to be sent to? [facility]   3. Do they need a 30 day or 90 day supply? [facility] THE REST NEED TO BE SENT TO THE MAIL ORDER

## 2020-03-02 DIAGNOSIS — R5383 Other fatigue: Secondary | ICD-10-CM | POA: Diagnosis not present

## 2020-03-02 DIAGNOSIS — R001 Bradycardia, unspecified: Secondary | ICD-10-CM | POA: Diagnosis not present

## 2020-03-04 ENCOUNTER — Telehealth: Payer: Self-pay | Admitting: Cardiovascular Disease

## 2020-03-04 MED ORDER — LISINOPRIL 20 MG PO TABS: 20.0000 mg | ORAL_TABLET | Freq: Every day | ORAL | 3 refills | Status: DC

## 2020-03-04 NOTE — Telephone Encounter (Signed)
° ° °  Pt c/o medication issue:  1. Name of Medication: lisinopril 20 mg  valsartan-hydrochlorothiazide (DIOVAN HCT) 160-12.5 MG tablet    2. How are you currently taking this medication (dosage and times per day)? Take 1 tablet by mouth daily.  3. Are you having a reaction (difficulty breathing--STAT)?   4. What is your medication issue? Pt is calling back she would like to get lisinopril 20 mg send to her pharmacy [id number] - [place], [place]. AT [address]. Per notes 02/18/2020 Dr. Oval Linsey ok'd pt to go back to her old medication

## 2020-03-04 NOTE — Telephone Encounter (Signed)
Spoke with patient. Patient would like a refill on her lisinopril 20mg  tablets. Per 10/21 encounter okay to resume previous medications. Prescription sent to preferred pharmacy. Patient verbalized understanding.

## 2020-03-07 ENCOUNTER — Telehealth: Payer: Self-pay | Admitting: Cardiovascular Disease

## 2020-03-07 ENCOUNTER — Other Ambulatory Visit: Payer: Self-pay | Admitting: *Deleted

## 2020-03-07 ENCOUNTER — Other Ambulatory Visit: Payer: Self-pay | Admitting: Family Medicine

## 2020-03-07 MED ORDER — METFORMIN HCL ER 500 MG PO TB24: 500.0000 mg | ORAL_TABLET | Freq: Two times a day (BID) | ORAL | 1 refills | Status: DC

## 2020-03-07 NOTE — Telephone Encounter (Signed)
Patient calling the office for samples of medication:   1.  What medication and dosage are you requesting samples for? lisinopril 20mg  tablets  2.  Are you currently out of this medication? Has one tablet left.   See previous phone note. [facility] but she was advised that she would not be able to pick it up until 11/14. She wants to know if there are any samples she can get to get her through to the 14th. Please advise.

## 2020-03-07 NOTE — Telephone Encounter (Signed)
Discussed w/pt all questions answered

## 2020-03-09 ENCOUNTER — Telehealth: Payer: Self-pay | Admitting: Family Medicine

## 2020-03-09 ENCOUNTER — Telehealth: Payer: Self-pay | Admitting: Dietician

## 2020-03-09 NOTE — Telephone Encounter (Signed)
Returned patient call. Patient states that she is hungry at times after meals and asks if it is OK to eat more.  Weight 103-104 lbs. Discussed the importance of adequate nutrition and goal of weight gain and that she should eat more to meet those goals.   She states that she saw her nephrologist and that her kidney function is fine.  Patient to call for questions.  Antonieta Iba, RD, LDN, CDCES

## 2020-03-09 NOTE — Telephone Encounter (Signed)
Reviewed form and placed in PCP's box for completion.  .Lisa Milian R Sly Parlee, CMA  

## 2020-03-09 NOTE — Telephone Encounter (Signed)
Patient Assistance program form dropped off for at front desk for completion.  Verified that patient section of form has been completed.  Last DOS/WCC with PCP was 02/03/20.  Placed form in team folder to be completed by clinical staff.  Creig Hines

## 2020-03-17 NOTE — Telephone Encounter (Signed)
Form completed, will return to [facility] RN team Also printed patient's medication list to attach to forms  [name], MD

## 2020-03-21 NOTE — Telephone Encounter (Signed)
Faxed to provider. Copy made and placed in batch scanning.   Talbot Grumbling, RN

## 2020-03-22 ENCOUNTER — Ambulatory Visit (INDEPENDENT_AMBULATORY_CARE_PROVIDER_SITE_OTHER): Payer: Medicare HMO | Admitting: Cardiovascular Disease

## 2020-03-22 ENCOUNTER — Other Ambulatory Visit: Payer: Self-pay

## 2020-03-22 ENCOUNTER — Encounter: Payer: Self-pay | Admitting: Cardiovascular Disease

## 2020-03-22 VITALS — BP 170/82 | HR 50 | Ht 61.0 in | Wt 108.0 lb

## 2020-03-22 DIAGNOSIS — I350 Nonrheumatic aortic (valve) stenosis: Secondary | ICD-10-CM | POA: Diagnosis not present

## 2020-03-22 DIAGNOSIS — Z5181 Encounter for therapeutic drug level monitoring: Secondary | ICD-10-CM | POA: Diagnosis not present

## 2020-03-22 DIAGNOSIS — E785 Hyperlipidemia, unspecified: Secondary | ICD-10-CM | POA: Diagnosis not present

## 2020-03-22 DIAGNOSIS — G4733 Obstructive sleep apnea (adult) (pediatric): Secondary | ICD-10-CM | POA: Diagnosis not present

## 2020-03-22 DIAGNOSIS — I10 Essential (primary) hypertension: Secondary | ICD-10-CM

## 2020-03-22 MED ORDER — HYDROCHLOROTHIAZIDE 12.5 MG PO TABS: 12.5000 mg | ORAL_TABLET | Freq: Every day | ORAL | 3 refills | Status: DC

## 2020-03-22 NOTE — Patient Instructions (Signed)
Medication Instructions:  STOP FUROSEMIDE   START HYDR CHLOROTHIAZIDE 12.5 MG DAILY   *If you need a refill on your cardiac medications before your next appointment, please call your pharmacy*  Lab Work: BMET IN 1 WEEK   If you have labs (blood work) drawn today and your tests are completely normal, you will receive your results only by: Marland Kitchen MyChart Message (if you have MyChart) OR . A paper copy in the mail If you have any lab test that is abnormal or we need to change your treatment, we will call you to review the results.  Testing/Procedures: NONE   Follow-Up: At [facility], you and your health needs are our priority.  As part of our continuing mission to provide you with exceptional heart care, we have created designated Provider Care Teams.  These Care Teams include your primary Cardiologist (physician) and Advanced Practice Providers (APPs -  Physician Assistants and Nurse Practitioners) who all work together to provide you with the care you need, when you need it.  We recommend signing up for the patient portal called "MyChart".  Sign up information is provided on this After Visit Summary.  MyChart is used to connect with patients for Virtual Visits (Telemedicine).  Patients are able to view lab/test results, encounter notes, upcoming appointments, etc.  Non-urgent messages can be sent to your provider as well.   To learn more about what you can do with MyChart, go to [email or web address].    Your next appointment:   4-6  week(s)  The format for your next appointment:   In Person  Provider:   You will see PHARM D OR one of the following Advanced Practice Providers on your designated Care Team:    Kerin Ransom, PA-C  [place], [place]  Coletta Memos, FNP  Then, Skeet Latch, MD will plan to see you again in 3 month(s).

## 2020-03-22 NOTE — Progress Notes (Signed)
Cardiology Office Note   Date:  03/22/2020   ID:  Mallory Hayes, DOB [birth year], MRN [id number]  PCP:  Mallory Rio, MD  Cardiologist:   Mallory Latch, MD   No chief complaint on file.     History of Present Illness: Mallory Hayes is a 74 y.o. female hypertension, hyperlipidemia, OSA on CPAP, and diabetes who presents for follow up.  She was seen 12/2015 for an evaluation of bradycardia. Mallory Hayes notes that her heart rate has been low for years.  She saw her PCP and was noted that her heart rate was in the 46. She was referred to cardiology for evaluation. At the time she was active and had no exertional symptoms. Echocardiogram revealed LVEF 55 to 60% with grade 1 diastolic dysfunction.  Mallory Hayes reported an episode of exertional chest pain while doing yardwork.  She had a [employer] 10/2019 that was negative for ischemia.  She had an echo 10/219 that revealed LVEF 60-65% and grade 2 diastolic dysfunction.  She notes that she stopped taking rosuvastatin because she didn't want to take so many pills.  At her last appointment she reported some palpitations.  She wore an ambulatory monitor 01/2020 that revealed occasional PACs and PVCs.  There are episodes when she pressed her monitor in sinus rhythm with underlying.  Lately she has been very stressed.  She has been very active with her church and has to give a sermon next month.  She is very nervous about this.  She is also stressed that she has been unable to keep her house as tightly as she would like lately because of her church activities.  She has not had any chest pain and her breathing is stable.  She denies lower extremity edema, orthopnea, or PND.  She is not exercising as much lately because of all these activities.  She has been eating at [employer] regularly.  At her last appointment she was switched to Tribenzor from lisinopril, amlodipine, and furosemide.  However she decided not to take it due to concern for potential  side effects.  She is back on amlodipine, lisinopril, and furosemide.   Past Medical History:  Diagnosis Date  . Diabetes mellitus without complication ([place])   . GERD (gastroesophageal reflux disease)   . OSA (obstructive sleep apnea)   . Sleep apnea     Past Surgical History:  Procedure Laterality Date  . APPENDECTOMY    . BREAST EXCISIONAL BIOPSY Right    benign  . CHOLECYSTECTOMY    . COLONOSCOPY  2011  . PARTIAL HYSTERECTOMY  1991     Current Outpatient Medications  Medication Sig Dispense Refill  . ACCU-CHEK SOFTCLIX LANCETS lancets USE TO CHECK BLOOD SUGAR ONCE DAILY 100 each 12  . Alcohol Swabs (B-D SINGLE USE SWABS REGULAR) PADS Use to check blood sugar once daily 100 each 3  . amLODipine (NORVASC) 10 MG tablet Take 1 tablet (10 mg total) by mouth daily. 30 tablet 5  . aspirin 81 MG tablet Take 81 mg by mouth daily.      . Blood Glucose Calibration (ACCU-CHEK AVIVA) SOLN USE AS DIRECTED AS NEEDED TO CALIBRATE GLUCOMETER 1 each 0  . Blood Glucose Monitoring Suppl (ACCU-CHEK AVIVA PLUS) w/Device KIT USE TO CHECK BLOOD SUGAR ONCE DAILY 1 kit 0  . Blood Pressure Monitoring (BLOOD PRESSURE MONITOR AUTOMAT) DEVI Check blood pressure 3 times per week 1 Device 0  . Calcium Carbonate Antacid (TUMS PO) Take by mouth.    Marland Kitchen  Elastic Bandages & Supports (WRIST SPLINT/COCK-UP/LEFT M) MISC Wear daily 1 each 0  . empagliflozin (JARDIANCE) 25 MG TABS tablet Take 25 mg by mouth daily.    . fluticasone (FLONASE) 50 MCG/ACT nasal spray Place 2 sprays into both nostrils daily. 16 g 11  . lisinopril (ZESTRIL) 20 MG tablet Take 1 tablet (20 mg total) by mouth daily. 90 tablet 3  . metFORMIN (GLUCOPHAGE-XR) 500 MG 24 hr tablet Take 1 tablet (500 mg total) by mouth 2 (two) times daily. 180 tablet 1  . Polyethylene Glycol 3350 (PEG 3350) 17 GM/SCOOP POWD Take 17g by mouth daily prn 850 g 1  . rosuvastatin (CRESTOR) 20 MG tablet TAKE 1 TABLET EVERY DAY 90 tablet 0  . vitamin E (E200) 200 UNIT  capsule Take by mouth.     No current facility-administered medications for this visit.    Allergies:   Amaryl [glimepiride], Food, and Sulfamethoxazole    Social History:  The patient  reports that she has never smoked. She has never used smokeless tobacco. She reports that she does not drink alcohol and does not use drugs.   Family History:  The patient's family history includes Cancer in her father and mother; Colon cancer (age of onset: 15) in her mother; Diabetes in her brother; Diabetes (age of onset: 20) in her mother; Hyperlipidemia in her mother; Hypertension in her mother; Ovarian cancer in her mother; Prostatitis in her brother.    ROS:  Please see the history of present illness.   Otherwise, review of systems are positive for none.   All other systems are reviewed and negative.    PHYSICAL EXAM: VS:  BP (!) 170/82 (BP Location: Right Arm)   Pulse (!) 50   Ht 5' 1"  (1.549 m)   Wt 108 lb (49 kg)   BMI 20.41 kg/m  , BMI Body mass index is 20.41 kg/m. GENERAL:  Well appearing HEENT: Pupils equal round and reactive, fundi not visualized, oral mucosa unremarkable NECK:  No jugular venous distention, waveform within normal limits, carotid upstroke brisk and symmetric, no bruits LUNGS:  Clear to auscultation bilaterally HEART:  Bradycardic.  Regular rhythm.  PMI not displaced or sustained,S1 and S2 within normal limits, no S3, no S4, no clicks, no rubs, II/VI systolic murmur ABD:  Flat, positive bowel sounds normal in frequency in pitch, no bruits, no rebound, no guarding, no midline pulsatile mass, no hepatomegaly, no splenomegaly EXT:  2 plus pulses throughout, no edema, no cyanosis no clubbing SKIN:  No rashes no nodules NEURO:  Cranial nerves II through XII grossly intact, motor grossly intact throughout PSYCH:  Cognitively intact, oriented to person place and time  EKG:  EKG is not ordered today. The ekg ordered today demonstrates sinus bradycardia.  Rate 49 bpm.   01/27/16: Sinus bradycardia. Rate 48 bpm.  Lexiscan Myoview 11/25/19:  There were transient T wave changes in the inferolateral leads during infusion that rapidly reolved in recovery  Nuclear stress EF: 58%.  The left ventricular ejection fraction is normal (55-65%).  The study is normal.  This is a low risk study.  Echo 02/03/2018: Study Conclusions   - Left ventricle: The cavity size was normal. Wall thickness was  normal. Systolic function was normal. The estimated ejection  fraction was in the range of 60% to 65%. Wall motion was normal;  there were no regional wall motion abnormalities. Features are  consistent with a pseudonormal left ventricular filling pattern,  with concomitant abnormal relaxation and increased filling  pressure (grade 2 diastolic dysfunction).  - Aortic valve: Mildly calcified annulus. There was mild stenosis.  Valve area (VTI): 1.52 cm^2. Valve area (Vmax): 1.54 cm^2. Valve  area (Vmean): 1.65 cm^2.  - Mitral valve: There was mild regurgitation.  3-day ZIO monitor 01/2020:  Quality: Fair.  Baseline artifact. Predominant rhythm: Sinus bradycardia Average heart rate: 55 bpm Max heart rate: 139 bpm Min heart rate: 34 bpm Pauses >2.5 seconds: None  Occasional PVCs and PACs.    Patient did submit a symptom diary.  She pushed the button twice at which time sinus rhythm was noted.   Recent Labs: 08/06/2019: BUN 24; Creatinine, Ser 1.33; Potassium 3.8; Sodium 140    Lipid Panel    Component Value Date/Time   CHOL 169 08/06/2019 1042   TRIG 37 08/06/2019 1042   HDL 86 08/06/2019 1042   CHOLHDL 2.0 08/06/2019 1042   CHOLHDL 1.8 12/02/2014 0952   VLDL 12 12/02/2014 0952   LDLCALC 75 08/06/2019 1042   LDLDIRECT 62 07/28/2012 0927      Wt Readings from Last 3 Encounters:  03/22/20 108 lb (49 kg)  02/03/20 104 lb 12.8 oz (47.5 kg)  01/28/20 104 lb (47.2 kg)     ASSESSMENT AND PLAN:  # Hypertension:  Blood pressure  remains poorly controlled.  She is hesitant to try any medicines.  She is willing to try HCTZ.  We will start at 12.5 mg and titrate as needed.  Stop furosemide.  Check a basic metabolic panel in a week.  Continue amlodipine and lisinopril at current doses.  # Bradycardia: # Fatigue:  Mallory Hayes is reporting fatigue at rest that improves with exertion. Her bradycardia may be contributing to her fatigue and it improves when she gets up and increases her heart rate with exertion.  She pressed her buttons at times while wearing her monitor but was not bradycardic or having arrhythmias at the time.  Most of her most profound bradycardia bradycardia occurred while sleeping.  No indication for pacing.  Avoid nodal agents.  # Hyperlipidemia: She stopped taking her rosuvastatin due to polypharmacy.  Lately she has been taking it.   Current medicines are reviewed at length with the patient today.  The patient does not have concerns regarding medicines.  The following changes have been made:  no change  Labs/ tests ordered today include:   No orders of the defined types were placed in this encounter.    Disposition:   FU with Cherylin Waguespack C. Oval Linsey, MD, [facility] in 3 months.  APP or PharmD in 4-6 weeks.    This note was written with the assistance of speech recognition software.  Please excuse any transcriptional errors.  Signed, Jules Vidovich C. Oval Linsey, MD, [facility]  03/22/2020 4:18 PM    [facility]

## 2020-04-05 ENCOUNTER — Telehealth: Payer: Self-pay | Admitting: Cardiovascular Disease

## 2020-04-05 MED ORDER — CHLORTHALIDONE 25 MG PO TABS: 25.0000 mg | ORAL_TABLET | Freq: Every day | ORAL | 3 refills | Status: DC

## 2020-04-05 NOTE — Telephone Encounter (Signed)
Spoke with patient. Informed patient of pharmacists recommendations. Lab slips mailed to patient. Patient verbalized understanding.

## 2020-04-05 NOTE — Telephone Encounter (Signed)
Spoke with patient. She reports she has been taking the HCTZ for about a week and she reports it leaves a bitter taste in her mouth that lingers to the next day and causes a sour stomach. She reports her stomach hurts after taking the medication. She reports she has tried the HCTZ with food and without food and both ways produce the same results. She reports she does not like red, green or brown pills as an FYI. She is requesting another medication be prescribed by doctor Oval Linsey.   Will route to MD and Pharmacists for review.

## 2020-04-05 NOTE — Telephone Encounter (Signed)
Pt c/o medication issue:  1. Name of Medication: hydrochlorothiazide (HYDRODIURIL) 12.5 MG tablet  2. How are you currently taking this medication (dosage and times per day)? Once daily   3. Are you having a reaction (difficulty breathing--STAT)? Stomach upset  4. What is your medication issue? Pt said she takes this medication and gets a sour taste in her mouth. Then she gets an upset stomach and looses her appetite.  The patient wanted to know if there was another medication she could take. Please advise

## 2020-04-05 NOTE — Telephone Encounter (Signed)
STOP taking HCTZ  START taking chlorthalidone 25mg   Overdue to repeat BMET, please repeat 2weeks after starting new medication  Already has appointment with pharmacist on Dec/28, please keep appointment.  Stay hydrated and avoid salty food

## 2020-04-06 ENCOUNTER — Telehealth: Payer: Self-pay | Admitting: Cardiovascular Disease

## 2020-04-06 ENCOUNTER — Other Ambulatory Visit: Payer: Self-pay

## 2020-04-06 ENCOUNTER — Ambulatory Visit (INDEPENDENT_AMBULATORY_CARE_PROVIDER_SITE_OTHER): Payer: Medicare HMO

## 2020-04-06 DIAGNOSIS — Z23 Encounter for immunization: Secondary | ICD-10-CM

## 2020-04-06 NOTE — Telephone Encounter (Signed)
Completed. Did not need to contact patient .

## 2020-04-06 NOTE — Telephone Encounter (Signed)
Patient called back and said that her medication is ready at her pharmacy. Please disregard the previous message

## 2020-04-06 NOTE — Telephone Encounter (Signed)
New Message   Pt c/o medication issue:  1. Name of Medication: chlorthalidone (HYGROTON) 25 MG tablet  2. How are you currently taking this medication (dosage and times per day)? Pt has not yet started taking   3. Are you having a reaction (difficulty breathing--STAT)?  No   4. What is your medication issue? Pt says the pharmacy will not have this medication ready until December 21st. She is wondering if she should still take her Lasix until then?  Please call

## 2020-04-06 NOTE — Progress Notes (Signed)
   Covid-19 Vaccination Clinic  Name:  Mallory Hayes    MRN: [id number] DOB: 1946-02-02  04/06/2020  [name] was observed post Covid-19 immunization for 15 minutes without incident. She was provided with Vaccine Information Sheet and instruction to access the V-Safe system.   [name] was instructed to call 911 with any severe reactions post vaccine: Marland Kitchen Difficulty breathing  . Swelling of face and throat  . A fast heartbeat  . A bad rash all over body  . Dizziness and weakness   Booster administered RD without complication.

## 2020-04-11 ENCOUNTER — Telehealth: Payer: Self-pay

## 2020-04-11 NOTE — Telephone Encounter (Signed)
Patient LVM on nurse line regarding diarrhea since receiving booster last week (04/06/20). Patient reports that she feels like her body is "rejecting the booster". Attempted to call patient back to discuss further. No answer, left HIPAA compliant VM for patient to return call to office.   Talbot Grumbling, RN

## 2020-04-12 ENCOUNTER — Telehealth: Payer: Self-pay

## 2020-04-12 ENCOUNTER — Other Ambulatory Visit: Payer: Self-pay

## 2020-04-12 NOTE — Telephone Encounter (Signed)
Left a voicemail for patient today instructing patient to call [employer] at [phone] to inquire about eligibility.  Per [place] ([employer]), they require a denial letter from [employer] in order for re-enrollment approval for Jardiance for 2022.

## 2020-04-12 NOTE — Patient Outreach (Signed)
[facility]) Care Management  04/12/2020  Mallory Hayes 01-Jan-1946 [id number]   Telephone call to patient for disease management follow up.   No answer.  HIPAA compliant voice message left.    Plan: If no return call, RN CM will attempt patient again in the month of March.  [name], RN, MSN [place] [facility] Care Management Coordinator Direct Line [phone] Cell [phone] Toll Free: [phone]  Fax: [phone]

## 2020-04-13 ENCOUNTER — Ambulatory Visit: Payer: Medicare HMO

## 2020-04-13 NOTE — Telephone Encounter (Signed)
Will be happy to speak with patient if she calls again [name], MD

## 2020-04-19 DIAGNOSIS — I10 Essential (primary) hypertension: Secondary | ICD-10-CM | POA: Diagnosis not present

## 2020-04-19 DIAGNOSIS — Z5181 Encounter for therapeutic drug level monitoring: Secondary | ICD-10-CM | POA: Diagnosis not present

## 2020-04-20 ENCOUNTER — Ambulatory Visit (INDEPENDENT_AMBULATORY_CARE_PROVIDER_SITE_OTHER): Payer: Medicare HMO | Admitting: Podiatry

## 2020-04-20 ENCOUNTER — Other Ambulatory Visit: Payer: Self-pay

## 2020-04-20 ENCOUNTER — Encounter: Payer: Self-pay | Admitting: Podiatry

## 2020-04-20 DIAGNOSIS — E1169 Type 2 diabetes mellitus with other specified complication: Secondary | ICD-10-CM | POA: Diagnosis not present

## 2020-04-20 DIAGNOSIS — M79674 Pain in right toe(s): Secondary | ICD-10-CM | POA: Diagnosis not present

## 2020-04-20 DIAGNOSIS — M79675 Pain in left toe(s): Secondary | ICD-10-CM

## 2020-04-20 DIAGNOSIS — B351 Tinea unguium: Secondary | ICD-10-CM

## 2020-04-20 LAB — BASIC METABOLIC PANEL
BUN/Creatinine Ratio: 31 — ABNORMAL HIGH (ref 12–28)
BUN: 37 mg/dL — ABNORMAL HIGH (ref 8–27)
CO2: 26 mmol/L (ref 20–29)
Calcium: 9.9 mg/dL (ref 8.7–10.3)
Chloride: 92 mmol/L — ABNORMAL LOW (ref 96–106)
Creatinine, Ser: 1.19 mg/dL — ABNORMAL HIGH (ref 0.57–1.00)
GFR calc Af Amer: 52 mL/min/{1.73_m2} — ABNORMAL LOW (ref >59)
GFR calc non Af Amer: 45 mL/min/{1.73_m2} — ABNORMAL LOW (ref >59)
Glucose: 174 mg/dL — ABNORMAL HIGH (ref 65–99)
Potassium: 3.8 mmol/L (ref 3.5–5.2)
Sodium: 132 mmol/L — ABNORMAL LOW (ref 134–144)

## 2020-04-20 LAB — HM DIABETES FOOT EXAM

## 2020-04-20 NOTE — Progress Notes (Signed)
This patient returns to my office for at risk foot care.  This patient requires this care by a professional since this patient will be at risk due to having CKD stage 3 and type 2 diabetes.   This patient is unable to cut nails herself since the patient cannot reach her nails.These nails are painful walking and wearing shoes.  This patient presents for at risk foot care today.  General Appearance  Alert, conversant and in no acute stress.  Vascular  Dorsalis pedis and posterior tibial  pulses are palpable  bilaterally.  Capillary return is within normal limits  bilaterally. Temperature is within normal limits  bilaterally.  Neurologic  Senn-Weinstein monofilament wire test within normal limits  bilaterally. Muscle power within normal limits bilaterally.  Nails Thick disfigured discolored nails with subungual debris  from hallux to fifth toes bilaterally. No evidence of bacterial infection or drainage bilaterally.  Orthopedic  No limitations of motion  feet .  No crepitus or effusions noted.  No bony pathology or digital deformities noted.  HAV  B/L and tailors bunion  B/L.  Skin  normotropic skin with no porokeratosis noted bilaterally.  No signs of infections or ulcers noted.     Onychomycosis  Pain in right toes  Pain in left toes  Consent was obtained for treatment procedures.   Mechanical debridement of nails 1-5  bilaterally performed with a nail nipper.  Filed with dremel without incident.    Return office visit     3 months                 Told patient to return for periodic foot care and evaluation due to potential at risk complications.   Shandra Szymborski DPM  

## 2020-04-25 ENCOUNTER — Encounter: Payer: Self-pay | Admitting: Family Medicine

## 2020-04-26 ENCOUNTER — Other Ambulatory Visit: Payer: Self-pay

## 2020-04-26 ENCOUNTER — Ambulatory Visit (INDEPENDENT_AMBULATORY_CARE_PROVIDER_SITE_OTHER): Payer: Medicare HMO | Admitting: Pharmacist

## 2020-04-26 VITALS — BP 110/64 | HR 96 | Resp 16 | Ht 61.0 in | Wt 104.0 lb

## 2020-04-26 DIAGNOSIS — I10 Essential (primary) hypertension: Secondary | ICD-10-CM | POA: Diagnosis not present

## 2020-04-26 DIAGNOSIS — R634 Abnormal weight loss: Secondary | ICD-10-CM | POA: Diagnosis not present

## 2020-04-26 DIAGNOSIS — E785 Hyperlipidemia, unspecified: Secondary | ICD-10-CM | POA: Diagnosis not present

## 2020-04-26 NOTE — Assessment & Plan Note (Signed)
Patient encourage to increase lean protein in diet by 2-4 additional ounce per day. List of food provided today.

## 2020-04-26 NOTE — Patient Instructions (Addendum)
Return for a  follow up appointment in 4 weeks with Dr Oval Linsey  Check your blood pressure at home daily (if able) and keep record of the readings.  Take your BP meds as follows: *NO changes*  *Try to add 2-4 onces of protein in your diet, and try to sleep 6-8 hours per day *White-Fleshed Fish [place] Yogurt.  Beans, Peas and Lentils Skinless, White-Meat Poultry Low-Fat Cheese. Lean Beef Peanut Butter  *Okay to resume rosuvastatin at 1 tablet every other after for 2 weeks, then increase to 1 tablet daily*  Bring all of your meds, your BP cuff and your record of home blood pressures to your next appointment.  Exercise as you're able, try to walk approximately 30 minutes per day.  Keep salt intake to a minimum, especially watch canned and prepared boxed foods.  Eat more fresh fruits and vegetables and fewer canned items.  Avoid eating in fast food restaurants.    HOW TO TAKE YOUR BLOOD PRESSURE: . Rest 5 minutes before taking your blood pressure. .  Don't smoke or drink caffeinated beverages for at least 30 minutes before. . Take your blood pressure before (not after) you eat. . Sit comfortably with your back supported and both feet on the floor (don't cross your legs). . Elevate your arm to heart level on a table or a desk. . Use the proper sized cuff. It should fit smoothly and snugly around your bare upper arm. There should be enough room to slip a fingertip under the cuff. The bottom edge of the cuff should be 1 inch above the crease of the elbow. . Ideally, take 3 measurements at one sitting and record the average.

## 2020-04-26 NOTE — Assessment & Plan Note (Signed)
Patient stopped taking rosuvastatin again. We discuss option to take it with her supper or lunch and start taking every other day for 2 weeks, then increase to 1 tablet daily.

## 2020-04-26 NOTE — Assessment & Plan Note (Signed)
Blood pressure at goal today. Patient denies problems with current therapy and BMET remains stable as well. Will continue current therapy without changes. She should try to sleep few more hours per day if possible, and follow up with DR Oval Linsey in 4-5 weeks. Patient will monitor BP twice daily and bring records to f/u appointment with cardiologist.

## 2020-04-26 NOTE — Progress Notes (Signed)
Patient ID: Mallory Hayes                 DOB: [birth year]                      MRN: [id number]     HPI: Mallory Hayes is a 73 y.o. female referred by Dr. Oval Linsey to HTN clinic.  PMH includes hypertension, hyperlipidemia, OSA on CPAP, diabetes, bradycardia, and CKD.  Noted patient is hesitant to medication changes. Patient reported bitter taste and upset stomach with HCTZ. Chlorthalidone was ordered on Dec/7 to replace HCTZ, and repeat BMET shows stable renal function and electrolytes.   Current HTN meds:  Amlodipine 10mg  daily Chlorthalidone 25mg  daily Lisinopril 20mg  daily  Previously tried:  HCTZ - bitter taste in her mouth and upset stomach  BP goal: <130/80  Family History:  family history includes Cancer in her father and mother; Colon cancer (age of onset: 66) in her mother; Diabetes in her brother; Diabetes (age of onset: 44) in her mother; Hyperlipidemia in her mother; Hypertension in her mother; Ovarian cancer in her mother; Prostatitis in her brother.   Social History: patient  reports that she has never smoked. She has never used smokeless tobacco. She reports that she does not drink alcohol and does not use drugs.   Diet: balanced diet, salads, oatmeal, eggs, cereal  Exercise: activities of daily living  Home BP readings: no BP from home provided  Wt Readings from Last 3 Encounters:  04/26/20 104 lb (47.2 kg)  03/22/20 108 lb (49 kg)  02/03/20 104 lb 12.8 oz (47.5 kg)   BP Readings from Last 3 Encounters:  04/26/20 110/64  03/22/20 (!) 170/82  02/03/20 140/75   Pulse Readings from Last 3 Encounters:  04/26/20 96  03/22/20 (!) 50  02/03/20 (!) 55    Renal function: Estimated Creatinine Clearance: 31.4 mL/min (A) (by C-G formula based on SCr of 1.19 mg/dL (H)).  Past Medical History:  Diagnosis Date  . Diabetes mellitus without complication ([place])   . GERD (gastroesophageal reflux disease)   . OSA (obstructive sleep apnea)   . Sleep apnea     Current  Outpatient Medications on File Prior to Visit  Medication Sig Dispense Refill  . amLODipine (NORVASC) 10 MG tablet Take 1 tablet (10 mg total) by mouth daily. 30 tablet 5  . aspirin 81 MG tablet Take 81 mg by mouth daily.    . Blood Pressure Monitoring (BLOOD PRESSURE MONITOR AUTOMAT) DEVI Check blood pressure 3 times per week 1 Device 0  . Calcium Carbonate Antacid (TUMS PO) Take by mouth.    . chlorthalidone (HYGROTON) 25 MG tablet Take 1 tablet (25 mg total) by mouth daily. 30 tablet 3  . Elastic Bandages & Supports (WRIST SPLINT/COCK-UP/LEFT M) MISC Wear daily 1 each 0  . empagliflozin (JARDIANCE) 25 MG TABS tablet Take 25 mg by mouth daily.    . fluticasone (FLONASE) 50 MCG/ACT nasal spray Place 2 sprays into both nostrils daily. 16 g 11  . lisinopril (ZESTRIL) 20 MG tablet Take 1 tablet (20 mg total) by mouth daily. 90 tablet 3  . metFORMIN (GLUCOPHAGE-XR) 500 MG 24 hr tablet Take 1 tablet (500 mg total) by mouth 2 (two) times daily. 180 tablet 1  . Polyethylene Glycol 3350 (PEG 3350) 17 GM/SCOOP POWD Take 17g by mouth daily prn 850 g 1  . vitamin E 200 UNIT capsule Take by mouth.    . rosuvastatin (CRESTOR) 20 MG tablet TAKE  1 TABLET EVERY DAY (Patient not taking: Reported on 04/26/2020) 90 tablet 0   No current facility-administered medications on file prior to visit.    Allergies  Allergen Reactions  . Amaryl [Glimepiride] Anxiety  . Food Itching    Spices, peppers  . Sulfamethoxazole Itching, Rash and Other (See Comments)    sneezing sneezing    Blood pressure 110/64, pulse 96, resp. rate 16, height 5\' 1"  (1.549 m), weight 104 lb (47.2 kg), SpO2 96 %.  No problem-specific Assessment & Plan notes found for this encounter.    Rishik Tubby Rodriguez-Guzman PharmD, BCPS, [place] [address] [place] [address] 04/26/2020 4:31 PM

## 2020-05-02 ENCOUNTER — Ambulatory Visit: Payer: Medicare HMO

## 2020-05-11 ENCOUNTER — Other Ambulatory Visit: Payer: Self-pay

## 2020-05-11 ENCOUNTER — Ambulatory Visit (INDEPENDENT_AMBULATORY_CARE_PROVIDER_SITE_OTHER): Payer: Medicare HMO

## 2020-05-11 DIAGNOSIS — Z23 Encounter for immunization: Secondary | ICD-10-CM

## 2020-05-11 NOTE — Progress Notes (Signed)
Flu Vaccine administered RD without complication.  

## 2020-05-13 NOTE — Telephone Encounter (Signed)
Patient had visti 11/23

## 2020-05-23 ENCOUNTER — Ambulatory Visit: Payer: Medicare HMO | Admitting: Cardiovascular Disease

## 2020-06-29 ENCOUNTER — Other Ambulatory Visit: Payer: Self-pay | Admitting: Cardiovascular Disease

## 2020-07-01 ENCOUNTER — Other Ambulatory Visit: Payer: Self-pay

## 2020-07-01 NOTE — Patient Instructions (Signed)
Goals    . Blood Pressure < 140/90    . HEMOGLOBIN A1C < 7.0    . Make and Keep All Appointments     Timeframe:  Long-Range Goal Priority:  High Start Date:    07/01/20                         Expected End Date:   01/27/21                    Follow Up Date 10/27/20   - call to cancel if needed - keep a calendar with appointment dates    Why is this important?    Part of staying healthy is seeing the doctor for follow-up care.   If you forget your appointments, there are some things you can do to stay on track.    Notes:     Marland Kitchen Monitor and Manage My Blood Sugar-Diabetes Type 2     Timeframe:  Long-Range Goal Priority:  High Start Date:   07/28/20                          Expected End Date:   01/27/21                    Follow Up Date 10/27/20    - check blood sugar at prescribed times - take the blood sugar log to all doctor visits    Why is this important?    Checking your blood sugar at home helps to keep it from getting very high or very low.   Writing the results in a diary or log helps the doctor know how to care for you.   Your blood sugar log should have the time, date and the results.   Also, write down the amount of insulin or other medicine that you take.   Other information, like what you ate, exercise done and how you were feeling, will also be helpful.     Notes: 07/01/20 A1c 7.8

## 2020-07-01 NOTE — Patient Outreach (Signed)
[facility]) Care Management  [place]  07/01/2020   Mallory Hayes 1945/10/21 [id number]  Subjective: Telephone call to patient for disease management follow up. Patient reports she is doing pretty good. Patient last A1c 7.8.  Patient has not been checking her sugar as she wants to try to get a libre.  Encouraged patient to talk with her physician to order it but did warn that it may not be covered by insurance. She verbalized understanding. Encouraged her to continue to check her sugars manually. She verbalized understanding.  Also discussed diet and importance of diabetes management. She verbalized understanding.    Objective:   Encounter Medications:  Outpatient Encounter Medications as of 07/01/2020  Medication Sig   chlorthalidone (HYGROTON) 25 MG tablet TAKE 1 TABLET BY MOUTH EVERY DAY   amLODipine (NORVASC) 10 MG tablet Take 1 tablet (10 mg total) by mouth daily.   aspirin 81 MG tablet Take 81 mg by mouth daily.   Blood Pressure Monitoring (BLOOD PRESSURE MONITOR AUTOMAT) DEVI Check blood pressure 3 times per week   Calcium Carbonate Antacid (TUMS PO) Take by mouth.   Elastic Bandages & Supports (WRIST SPLINT/COCK-UP/LEFT M) MISC Wear daily   empagliflozin (JARDIANCE) 25 MG TABS tablet Take 25 mg by mouth daily.   fluticasone (FLONASE) 50 MCG/ACT nasal spray Place 2 sprays into both nostrils daily.   lisinopril (ZESTRIL) 20 MG tablet Take 1 tablet (20 mg total) by mouth daily.   metFORMIN (GLUCOPHAGE-XR) 500 MG 24 hr tablet Take 1 tablet (500 mg total) by mouth 2 (two) times daily.   Polyethylene Glycol 3350 (PEG 3350) 17 GM/SCOOP POWD Take 17g by mouth daily prn   rosuvastatin (CRESTOR) 20 MG tablet TAKE 1 TABLET EVERY DAY (Patient not taking: Reported on 04/26/2020)   vitamin E 200 UNIT capsule Take by mouth.   No facility-administered encounter medications on file as of 07/01/2020.    Functional Status:  In your present state of health, do  you have any difficulty performing the following activities: 01/12/2020 08/14/2019  Hearing? N N  Vision? N N  Difficulty concentrating or making decisions? N N  Walking or climbing stairs? N N  Dressing or bathing? N N  Doing errands, shopping? N N  Preparing Food and eating ? N N  Using the Toilet? N N  In the past six months, have you accidently leaked urine? N N  Do you have problems with loss of bowel control? N N  Managing your Medications? N N  Managing your Finances? N N  Housekeeping or managing your Housekeeping? N N  Some recent data might be hidden    Fall/Depression Screening: Fall Risk  07/01/2020 01/12/2020 12/21/2019  Falls in the past year? 0 0 0  Number falls in past yr: - - -  Injury with Fall? - - -  Risk Factor Category  - - -  Risk for fall due to : - - -  Follow up - - -   PHQ 2/9 Scores 07/01/2020 01/12/2020 12/21/2019 09/14/2019 08/14/2019 07/28/2019 08/27/2018  PHQ - 2 Score 0 0 0 0 1 4 1   PHQ- 9 Score - - - - - - -    Assessment:  Goals Addressed            This Visit's Progress    Make and Keep All Appointments       Timeframe:  Long-Range Goal Priority:  High Start Date:    07/01/20  Expected End Date:   01/27/21                    Follow Up Date 10/27/20   - call to cancel if needed - keep a calendar with appointment dates    Why is this important?    Part of staying healthy is seeing the doctor for follow-up care.   If you forget your appointments, there are some things you can do to stay on track.    Notes:      Monitor and Manage My Blood Sugar-Diabetes Type 2       Timeframe:  Long-Range Goal Priority:  High Start Date:   07/28/20                          Expected End Date:   01/27/21                    Follow Up Date 10/27/20    - check blood sugar at prescribed times - take the blood sugar log to all doctor visits    Why is this important?    Checking your blood sugar at home helps to keep it from getting  very high or very low.   Writing the results in a diary or log helps the doctor know how to care for you.   Your blood sugar log should have the time, date and the results.   Also, write down the amount of insulin or other medicine that you take.   Other information, like what you ate, exercise done and how you were feeling, will also be helpful.     Notes: 07/01/20 A1c 7.8       Plan: RN CM will contact patient in June. Follow-up:  Patient agrees to Care Plan and Follow-up.   [name], RN, MSN [facility] Care Management Coordinator Direct Line [phone] Cell [phone] Toll Free: [phone]  Fax: [phone]

## 2020-07-13 ENCOUNTER — Other Ambulatory Visit: Payer: Self-pay | Admitting: Family Medicine

## 2020-07-13 DIAGNOSIS — Z1231 Encounter for screening mammogram for malignant neoplasm of breast: Secondary | ICD-10-CM

## 2020-07-18 ENCOUNTER — Telehealth: Payer: Self-pay

## 2020-07-18 NOTE — Telephone Encounter (Signed)
Pt returned phone call and requested for the application ([employer]) to be mailed to her home. She can sign and send back with requested financial info and LIS denial letter.   Will send out tomorrow when in office at [facility].

## 2020-07-18 NOTE — Telephone Encounter (Signed)
Left voicemail following up from her request for patient assistance on one of her medications (jardiance).   Gave call back number [phone]

## 2020-07-26 ENCOUNTER — Other Ambulatory Visit: Payer: Self-pay

## 2020-07-26 ENCOUNTER — Encounter: Payer: Self-pay | Admitting: Podiatry

## 2020-07-26 ENCOUNTER — Ambulatory Visit (INDEPENDENT_AMBULATORY_CARE_PROVIDER_SITE_OTHER): Payer: Medicare HMO | Admitting: Podiatry

## 2020-07-26 ENCOUNTER — Other Ambulatory Visit: Payer: Self-pay | Admitting: Cardiovascular Disease

## 2020-07-26 DIAGNOSIS — M79675 Pain in left toe(s): Secondary | ICD-10-CM

## 2020-07-26 DIAGNOSIS — E1169 Type 2 diabetes mellitus with other specified complication: Secondary | ICD-10-CM | POA: Diagnosis not present

## 2020-07-26 DIAGNOSIS — B351 Tinea unguium: Secondary | ICD-10-CM | POA: Diagnosis not present

## 2020-07-26 DIAGNOSIS — M79674 Pain in right toe(s): Secondary | ICD-10-CM | POA: Diagnosis not present

## 2020-07-26 NOTE — Progress Notes (Signed)
This patient returns to my office for at risk foot care.  This patient requires this care by a professional since this patient will be at risk due to having CKD stage 3 and type 2 diabetes.   This patient is unable to cut nails herself since the patient cannot reach her nails.These nails are painful walking and wearing shoes.  This patient presents for at risk foot care today.  General Appearance  Alert, conversant and in no acute stress.  Vascular  Dorsalis pedis and posterior tibial  pulses are palpable  bilaterally.  Capillary return is within normal limits  bilaterally. Temperature is within normal limits  bilaterally.  Neurologic  Senn-Weinstein monofilament wire test within normal limits  bilaterally. Muscle power within normal limits bilaterally.  Nails Thick disfigured discolored nails with subungual debris  from hallux to fifth toes bilaterally. No evidence of bacterial infection or drainage bilaterally.  Orthopedic  No limitations of motion  feet .  No crepitus or effusions noted.  No bony pathology or digital deformities noted.  HAV  B/L and tailors bunion  B/L.  Skin  normotropic skin with no porokeratosis noted bilaterally.  No signs of infections or ulcers noted.     Onychomycosis  Pain in right toes  Pain in left toes  Consent was obtained for treatment procedures.   Mechanical debridement of nails 1-5  bilaterally performed with a nail nipper.  Filed with dremel without incident.    Return office visit     3 months                 Told patient to return for periodic foot care and evaluation due to potential at risk complications.   Reese Senk DPM  

## 2020-08-11 ENCOUNTER — Encounter: Payer: Self-pay | Admitting: Family Medicine

## 2020-08-11 ENCOUNTER — Ambulatory Visit (INDEPENDENT_AMBULATORY_CARE_PROVIDER_SITE_OTHER): Payer: Medicare HMO | Admitting: Family Medicine

## 2020-08-11 ENCOUNTER — Other Ambulatory Visit: Payer: Self-pay

## 2020-08-11 ENCOUNTER — Ambulatory Visit (INDEPENDENT_AMBULATORY_CARE_PROVIDER_SITE_OTHER): Payer: Medicare HMO

## 2020-08-11 VITALS — BP 124/72 | HR 56 | Wt 107.0 lb

## 2020-08-11 DIAGNOSIS — Z8041 Family history of malignant neoplasm of ovary: Secondary | ICD-10-CM

## 2020-08-11 DIAGNOSIS — Z23 Encounter for immunization: Secondary | ICD-10-CM

## 2020-08-11 DIAGNOSIS — E785 Hyperlipidemia, unspecified: Secondary | ICD-10-CM

## 2020-08-11 DIAGNOSIS — I10 Essential (primary) hypertension: Secondary | ICD-10-CM

## 2020-08-11 DIAGNOSIS — E1169 Type 2 diabetes mellitus with other specified complication: Secondary | ICD-10-CM

## 2020-08-11 LAB — POCT GLYCOSYLATED HEMOGLOBIN (HGB A1C): HbA1c, POC (controlled diabetic range): 9.3 % — AB (ref 0.0–7.0)

## 2020-08-11 MED ORDER — ROSUVASTATIN CALCIUM 20 MG PO TABS: 20.0000 mg | ORAL_TABLET | Freq: Every day | ORAL | 3 refills | Status: DC

## 2020-08-11 NOTE — Patient Instructions (Signed)
It was great to see you again today!  Referring to nutritionist Follow up in 3 months with me  Second COVID booster today  Labs today  Referring to OB/GYN  Refilled crestor  Be well, Dr. Ardelia Mems

## 2020-08-11 NOTE — Progress Notes (Signed)
  Date of Visit: 08/11/2020   SUBJECTIVE:   HPI:  Mallory Hayes presents today for routine follow up.  Diabetes - currently taking jardiance 25mg  daily and metformin XR 500mg  twice daily. Tolerating these medications well. Admits to dietary indiscretions at home.   Hyperlipidemia - not taking crestor 20mg  daily as previously prescribed, does not know why, it just hasn't been in her medications at home.  Hypertension - taking lisinopril 20mg  daily, amlodipine 10mg  daily, and chlorthalidone 25mg  daily. Tolerating these well.  OBJECTIVE:   BP 124/72   Pulse (!) 56   Wt 107 lb (48.5 kg)   SpO2 98%   BMI 20.22 kg/m  Gen: no acute distress, pleasant cooperative HEENT: normocephalic, atraumatic  Heart: regular rate and rhythm, no murmur Lungs: clear to auscultation bilaterally, normal work of breathing  Neuro: alert, grossly nonfocal, speech normal Ext: No appreciable lower extremity edema bilaterally   ASSESSMENT/PLAN:   Health maintenance:  -second COVID booster given today -due for next colonoscopy later this year (7 years after last scope) -patient requests referral to OBGYN for routine well woman visit given her family history of ovarian cancer, this is reasonable (she prefers to go there rather than here). Referral entered.  Type 2 diabetes mellitus A1c above goal at 9.3. suspect if diet were improved we could achieve adequate control without changing medications. Patient is motivated to make changes to diet. Will refer to nutritionist. Follow up in 3 months for next A1c.  Dyslipidemia Update lipids today, will need to restart statin.  Essential hypertension Well controlled. Continue current medication regimen.   FOLLOW UP: Follow up in 3 mos for next A1c Refer to OBGYN by patient preference Refer to nutrition  [place] J. Ardelia Mems, [place]

## 2020-08-12 LAB — CMP14+EGFR
ALT: 15 IU/L (ref 0–32)
AST: 23 IU/L (ref 0–40)
Albumin/Globulin Ratio: 1.5 (ref 1.2–2.2)
Albumin: 4.2 g/dL (ref 3.7–4.7)
Alkaline Phosphatase: 73 IU/L (ref 44–121)
BUN/Creatinine Ratio: 22 (ref 12–28)
BUN: 30 mg/dL — ABNORMAL HIGH (ref 8–27)
Bilirubin Total: 0.3 mg/dL (ref 0.0–1.2)
CO2: 21 mmol/L (ref 20–29)
Calcium: 9.7 mg/dL (ref 8.7–10.3)
Chloride: 95 mmol/L — ABNORMAL LOW (ref 96–106)
Creatinine, Ser: 1.37 mg/dL — ABNORMAL HIGH (ref 0.57–1.00)
Globulin, Total: 2.8 g/dL (ref 1.5–4.5)
Glucose: 145 mg/dL — ABNORMAL HIGH (ref 65–99)
Potassium: 3.8 mmol/L (ref 3.5–5.2)
Sodium: 134 mmol/L (ref 134–144)
Total Protein: 7 g/dL (ref 6.0–8.5)
eGFR: 41 mL/min/{1.73_m2} — ABNORMAL LOW (ref >59)

## 2020-08-12 LAB — LIPID PANEL
Chol/HDL Ratio: 2.7 ratio (ref 0.0–4.4)
Cholesterol, Total: 224 mg/dL — ABNORMAL HIGH (ref 100–199)
HDL: 82 mg/dL (ref >39)
LDL Chol Calc (NIH): 126 mg/dL — ABNORMAL HIGH (ref 0–99)
Triglycerides: 95 mg/dL (ref 0–149)
VLDL Cholesterol Cal: 16 mg/dL (ref 5–40)

## 2020-08-14 ENCOUNTER — Encounter: Payer: Self-pay | Admitting: Family Medicine

## 2020-08-15 NOTE — Assessment & Plan Note (Signed)
Update lipids today, will need to restart statin.

## 2020-08-15 NOTE — Assessment & Plan Note (Signed)
A1c above goal at 9.3. suspect if diet were improved we could achieve adequate control without changing medications. Patient is motivated to make changes to diet. Will refer to nutritionist. Follow up in 3 months for next A1c.

## 2020-08-15 NOTE — Assessment & Plan Note (Signed)
Well controlled. Continue current medication regimen.  

## 2020-08-19 ENCOUNTER — Other Ambulatory Visit: Payer: Self-pay

## 2020-08-24 ENCOUNTER — Other Ambulatory Visit: Payer: Self-pay

## 2020-08-24 ENCOUNTER — Ambulatory Visit (INDEPENDENT_AMBULATORY_CARE_PROVIDER_SITE_OTHER): Payer: Medicare HMO | Admitting: Family Medicine

## 2020-08-24 VITALS — BP 124/72 | HR 56 | Wt 105.4 lb

## 2020-08-24 DIAGNOSIS — S30860A Insect bite (nonvenomous) of lower back and pelvis, initial encounter: Secondary | ICD-10-CM

## 2020-08-24 DIAGNOSIS — L989 Disorder of the skin and subcutaneous tissue, unspecified: Secondary | ICD-10-CM | POA: Diagnosis not present

## 2020-08-24 DIAGNOSIS — W57XXXA Bitten or stung by nonvenomous insect and other nonvenomous arthropods, initial encounter: Secondary | ICD-10-CM

## 2020-08-24 NOTE — Patient Instructions (Signed)
Tick Bite Information, Adult Ticks are insects that draw blood for food. Most ticks live in shrubs and grassy and wooded areas. They climb onto people and animals that brush against the leaves and grasses that they rest on. Then they bite, attaching themselves to the skin. Most ticks are harmless, but some ticks may carry germs that can spread to a person through a bite and cause a disease. To reduce your risk of getting a disease from a tick bite, make sure you:  Take steps to prevent tick bites.  Check for ticks after being outdoors where ticks live.  Watch for symptoms of disease if a tick attached to you or if you suspect a tick bite. How can I prevent tick bites? Take these steps to help prevent tick bites when you go outdoors in an area where ticks live: Use insect repellent  Use insect repellent that has DEET (20% or higher), picaridin, or IR3535 in it. Follow the instructions on the label. Use these products on: ? Bare skin. ? The top of your boots. ? Your pant legs. ? Your sleeve cuffs.  For insect repellent that contains permethrin, follow the instructions on the label. Use these products on: ? Clothing. ? Boots. ? Outdoor gear. ? Tents. When you are outside  Wear protective clothing. Long sleeves and long pants offer the best protection from ticks.  Wear light-colored clothing so you can see ticks more easily.  Tuck your pant legs into your socks.  If you go walking on a trail, stay in the middle of the trail so your skin, hair, and clothing do not touch the bushes.  Avoid walking through areas with long grass.  Check for ticks on your clothing, hair, and skin often while you are outside, and check again before you go inside. Make sure to check the scalp, neck, armpits, waist, groin, and joint areas. These are the spots where ticks attach themselves most often. When you go indoors  Check your clothing for ticks. Tumble dry clothes in a dryer on high heat for at least  10 minutes. If clothes are damp, additional time may be needed. If clothes require washing, use hot water.  Examine gear and pets.  Shower soon after being outdoors.  Check your body for ticks. Conduct a full body check using a mirror. What is the proper way to remove a tick? If you find a tick on your body, remove it as soon as possible. Removing a tick sooner can prevent germs from passing to your body. Do not remove the tick with your bare fingers. To remove a tick that is crawling on your skin but has not bitten, use either of these methods:  Go outdoors and brush the tick off.  Remove the tick with tape or a lint roller. To remove a tick that is attached to your skin: 1. Wash your hands. If you have latex gloves, put them on. 2. Use fine-tipped tweezers, curved forceps, or a tick-removal tool to gently grasp the tick as close to your skin and the tick's head as possible. 3. Gently pull with a steady, upward, even pressure until the tick lets go. 4. When removing the tick: ? Take care to keep the tick's head attached to its body. ? Do not twist or jerk the tick. This can make the tick's head or mouth parts break off and remain in the skin. ? Do not squeeze or crush the tick's body. This could force disease-carrying fluids from the tick   into your body. Do not try to remove a tick with heat, alcohol, petroleum jelly, or fingernail polish. Using these methods can cause the tick to salivate and regurgitate into your bloodstream, increasing your risk of getting a disease.   What should I do after removing a tick?  Dispose of the tick. Do not crush a tick with your fingers.  Clean the bite area and your hands with soap and water, rubbing alcohol, or an iodine scrub.  If an antiseptic cream or ointment is available, apply a small amount to the bite site.  Wash and disinfect any instruments that you used to remove the tick. How should I dispose of a tick? To dispose of a live tick, use  one of these methods:  Place it in rubbing alcohol.  Place it in a sealed bag or container.  Wrap it tightly in tape.  Flush it down the toilet. Contact a health care provider if:  You have symptoms of a disease after a tick bite. Symptoms of a tick-borne disease can occur from moments after the tick bites to 30 days after a tick is removed. Symptoms include: ? Fever or chills. ? Any of these signs in the bite area:  A red rash that makes a circle (bull's-eye rash) in the bite area.  Redness and swelling. ? Headache. ? Muscle, joint, or bone pain. ? Abnormal tiredness. ? Numbness in your legs or difficulty walking or moving your legs. ? Tender, swollen lymph glands.  A part of a tick breaks off and gets stuck in your skin. Get help right away if:  You are not able to remove a tick.  You experience muscle weakness or paralysis.  Your symptoms get worse or you experience new symptoms.  You find an engorged tick on your skin and you are in an area where disease from ticks is a high risk. Summary  Ticks may carry germs that can spread to a person through a bite and cause a disease.  Wear protective clothing and use insect repellent to prevent tick bites. Follow the instructions on the label.  If you find a tick on your body, remove it as soon as possible. If the tick is attached, do not try to remove with heat, alcohol, petroleum jelly, or fingernail polish.  Remove the attached tick using fine-tipped tweezers, curved forceps, or a tick-removal tool. Gently pull with steady, upward, even pressure until the tick lets go. Do not twist or jerk the tick. Do not squeeze or crush the tick's body.  If you have symptoms of a disease after being bitten by a tick, contact a health care provider. This information is not intended to replace advice given to you by your health care provider. Make sure you discuss any questions you have with your health care provider. Document Revised:  04/13/2019 Document Reviewed: 04/13/2019 Elsevier Patient Education  2021 Elsevier Inc.  

## 2020-08-24 NOTE — Progress Notes (Signed)
    SUBJECTIVE:   CHIEF COMPLAINT / HPI:   Tick bite  Patient reports noticing a spot on her right upper buttock on Monday.  She reports that she spends time outside near trees every day.  On Tuesday, she was evaluating the area and pulled out a small brown tick.  She reports that the tick was flat and about 3 mm in size.  She did not save this tick.  At this time, she denies any rashes or flulike symptoms such as fever, chills, nausea, vomiting, diarrhea.  PERTINENT  PMH / PSH: anxiety, HTN, CHF  OBJECTIVE:   BP 124/72   Pulse (!) 56   Wt 105 lb 6.4 oz (47.8 kg)   SpO2 99%   BMI 19.92 kg/m   General: Well-appearing female, no acute distress Skin: Small area of induration and erythema on right upper gluteal.  No surrounding erythema or character stick rash.  No signs of retained tick.  See picture.     ASSESSMENT/PLAN:   Tick bite Patient presents today with tick bite.  She is not experiencing any flulike symptoms.  Unfortunately, she did not bring the tick with her.  Given patient's lack of symptoms, rash, recommend against prophylactic treatment.  Patient is agreeable.  I have provided her with a handout with return precautions. All questions answered.    Wilber Oliphant, MD [place]

## 2020-08-24 NOTE — Assessment & Plan Note (Addendum)
Patient presents today with tick bite.  She is not experiencing any flulike symptoms.  Unfortunately, she did not bring the tick with her.  Given patient's lack of symptoms, rash, recommend against prophylactic treatment.  Patient is agreeable.  I have provided her with a handout with return precautions. All questions answered.

## 2020-08-25 ENCOUNTER — Other Ambulatory Visit: Payer: Self-pay | Admitting: Family Medicine

## 2020-08-25 NOTE — Progress Notes (Signed)
Opened in error

## 2020-08-30 ENCOUNTER — Telehealth: Payer: Self-pay

## 2020-08-30 ENCOUNTER — Other Ambulatory Visit: Payer: Self-pay

## 2020-08-30 ENCOUNTER — Ambulatory Visit: Payer: Medicare HMO | Admitting: Cardiovascular Disease

## 2020-08-30 DIAGNOSIS — E1169 Type 2 diabetes mellitus with other specified complication: Secondary | ICD-10-CM

## 2020-08-30 NOTE — Telephone Encounter (Signed)
Pt has been out of medication for about a week, currently waiting on provider to send refills to [place]. Pt willing to get it through her insurance while we wait for patient assistance with [employer] to process. Will give one week worth of samples in the meantime.   Medication Samples have been provided to the patient.  Drug name: JARDIANCE       Strength: 25MG         Qty: 7 TABS  LOT: [id number]  Exp.Date: 01/27/22  Dosing instructions: 1 TABLET DAILY  The patient has been instructed regarding the correct time, dose, and frequency of taking this medication, including desired effects and most common side effects.   [place]ista Deck 4:28 PM 08/30/2020

## 2020-08-30 NOTE — Progress Notes (Signed)
Submitted application for JARDIANCE to [employer] ([employer]) for patient assistance.   Phone: [phone]  

## 2020-08-31 ENCOUNTER — Other Ambulatory Visit: Payer: Self-pay | Admitting: Family Medicine

## 2020-08-31 NOTE — Telephone Encounter (Signed)
Attempted to reach patient, I need to speak with her about her medications before refilling. LVM asking her to call back. When I went to send refills, I am getting a prompt that her CrCl is actually lower than 30 (as opposed to eGFR which is 41). I need to recheck labs on her to make sure her renal function is acceptable on these medications.  Will wait to hear back from patient.  [name], MD

## 2020-09-02 ENCOUNTER — Other Ambulatory Visit: Payer: Self-pay

## 2020-09-02 ENCOUNTER — Other Ambulatory Visit: Payer: Self-pay | Admitting: Family Medicine

## 2020-09-02 ENCOUNTER — Other Ambulatory Visit: Payer: Medicare HMO

## 2020-09-02 DIAGNOSIS — E1169 Type 2 diabetes mellitus with other specified complication: Secondary | ICD-10-CM

## 2020-09-02 MED ORDER — LISINOPRIL 20 MG PO TABS: 20.0000 mg | ORAL_TABLET | Freq: Every day | ORAL | 0 refills | Status: DC

## 2020-09-02 NOTE — Addendum Note (Signed)
Addended by: [name] on: 09/02/2020 08:58 AM   Modules accepted: Orders

## 2020-09-02 NOTE — Telephone Encounter (Signed)
Late entry Spoke with patient 5/4 and explained need to repeat labs Refilled lisinopril for her (this was the main one she was out of). Lab visit scheduled for 5/6 to repeat BMET  [name], MD

## 2020-09-03 LAB — BASIC METABOLIC PANEL
BUN/Creatinine Ratio: 21 (ref 12–28)
BUN: 30 mg/dL — ABNORMAL HIGH (ref 8–27)
CO2: 22 mmol/L (ref 20–29)
Calcium: 10.2 mg/dL (ref 8.7–10.3)
Chloride: 93 mmol/L — ABNORMAL LOW (ref 96–106)
Creatinine, Ser: 1.44 mg/dL — ABNORMAL HIGH (ref 0.57–1.00)
Glucose: 209 mg/dL — ABNORMAL HIGH (ref 65–99)
Potassium: 3.6 mmol/L (ref 3.5–5.2)
Sodium: 135 mmol/L (ref 134–144)
eGFR: 38 mL/min/{1.73_m2} — ABNORMAL LOW (ref >59)

## 2020-09-05 ENCOUNTER — Ambulatory Visit (INDEPENDENT_AMBULATORY_CARE_PROVIDER_SITE_OTHER): Payer: Medicare HMO

## 2020-09-05 DIAGNOSIS — Z0000 Encounter for general adult medical examination without abnormal findings: Secondary | ICD-10-CM

## 2020-09-05 NOTE — Progress Notes (Signed)
Subjective:   Mallory Hayes is a 75 y.o. female who presents for Medicare Annual (Subsequent) preventive examination.  Patient consented to have virtual visit and was identified by name and date of birth. Method of visit: Telephone  Encounter participants: Patient: Mallory Hayes - located at Home   Nurse/Provider: Dorna Bloom - located at [facility]  Others (if applicable): NA  Review of Systems: Defer to PCP.  Objective:   Vitals: There were no vitals taken for this visit.  There is no height or weight on file to calculate BMI.  Advanced Directives 09/05/2020 08/24/2020 08/11/2020 12/21/2019 08/14/2019 06/30/2018 06/16/2018  Does Patient Have a Medical Advance Directive? No No No Yes No No No  Does patient want to make changes to medical advance directive? - - - Yes (MAU/Ambulatory/Procedural Areas - Information given) - - -  Would patient like information on creating a medical advance directive? No - Patient declined No - Patient declined No - Patient declined - No - Patient declined No - Patient declined No - Patient declined   Tobacco Social History   Tobacco Use  Smoking Status Never Smoker  Smokeless Tobacco Never Used     Clinical Intake:  Pre-visit preparation completed: Yes  How often do you need to have someone help you when you read instructions, pamphlets, or other written materials from your doctor or pharmacy?: 2 - Rarely What is the last grade level you completed in school?: High School  Interpreter Needed?: No  Past Medical History:  Diagnosis Date  . Diabetes mellitus without complication ([place])   . GERD (gastroesophageal reflux disease)   . OSA (obstructive sleep apnea)   . Sleep apnea    Past Surgical History:  Procedure Laterality Date  . APPENDECTOMY    . BREAST EXCISIONAL BIOPSY Right    benign  . CHOLECYSTECTOMY    . COLONOSCOPY  2011  . PARTIAL HYSTERECTOMY  1991   Family History  Problem Relation Age of Onset  . Cancer Mother   . Hypertension  Mother   . Diabetes Mother 19  . Colon cancer Mother 42  . Ovarian cancer Mother   . Hyperlipidemia Mother   . Cancer Father   . Prostatitis Brother   . Diabetes Brother    Social History   Socioeconomic History  . Marital status: Single    Spouse name: Not on file  . Number of children: 0  . Years of education: 67  . Highest education level: High school graduate  Occupational History  . Not on file  Tobacco Use  . Smoking status: Never Smoker  . Smokeless tobacco: Never Used  Substance and Sexual Activity  . Alcohol use: No  . Drug use: No  . Sexual activity: Yes    Birth control/protection: None  Other Topics Concern  . Not on file  Social History Narrative   Pt's 73 yo mother died 02-Sep-2010 of breast cancer. Pt was very close with mother.  Has 2 siblings that live out of town, but has some cousins and friends in the [place] area.   Retired, never married   No h/o tob, EToH, or drug use   Enjoys walking, reading, dining out, and [employer]   Very active in her church    Social Determinants of Health   Financial Resource Strain: [place]   . Difficulty of Paying Living Expenses: Not hard at all  Food Insecurity: No Food Insecurity  . Worried About Charity fundraiser in the Last Year:  Never true  . Ran Out of Food in the Last Year: Never true  Transportation Needs: No Transportation Needs  . Lack of Transportation (Medical): No  . Lack of Transportation (Non-Medical): No  Physical Activity: Insufficiently Active  . Days of Exercise per Week: 7 days  . Minutes of Exercise per Session: 10 min  Stress: No Stress Concern Present  . Feeling of Stress : Not at all  Social Connections: Moderately Integrated  . Frequency of Communication with Friends and Family: More than three times a week  . Frequency of Social Gatherings with Friends and Family: More than three times a week  . Attends Religious Services: More than 4 times per year  . Active Member of Clubs or  Organizations: Yes  . Attends Archivist Meetings: More than 4 times per year  . Marital Status: Never married   Outpatient Encounter Medications as of 09/05/2020  Medication Sig  . amLODipine (NORVASC) 10 MG tablet TAKE 1 TABLET BY MOUTH EVERY DAY  . aspirin 81 MG tablet Take 81 mg by mouth daily.  . Blood Pressure Monitoring (BLOOD PRESSURE MONITOR AUTOMAT) DEVI Check blood pressure 3 times per week  . Calcium Carbonate Antacid (TUMS PO) Take by mouth.  . chlorthalidone (HYGROTON) 25 MG tablet TAKE 1 TABLET BY MOUTH EVERY DAY  . empagliflozin (JARDIANCE) 25 MG TABS tablet Take 25 mg by mouth daily.  . fluticasone (FLONASE) 50 MCG/ACT nasal spray Place 2 sprays into both nostrils daily.  Marland Kitchen lisinopril (ZESTRIL) 20 MG tablet Take 1 tablet (20 mg total) by mouth daily.  . metFORMIN (GLUCOPHAGE-XR) 500 MG 24 hr tablet Take 1 tablet (500 mg total) by mouth 2 (two) times daily.  . Polyethylene Glycol 3350 (PEG 3350) 17 GM/SCOOP POWD Take 17g by mouth daily prn  . rosuvastatin (CRESTOR) 20 MG tablet Take 1 tablet (20 mg total) by mouth daily.  . vitamin E 200 UNIT capsule Take by mouth.  Regino Schultze Bandages & Supports (WRIST SPLINT/COCK-UP/LEFT M) MISC Wear daily (Patient not taking: Reported on 09/05/2020)   No facility-administered encounter medications on file as of 09/05/2020.   Activities of Daily Living In your present state of health, do you have any difficulty performing the following activities: 09/05/2020 01/12/2020  Hearing? N N  Vision? N N  Difficulty concentrating or making decisions? N N  Walking or climbing stairs? N N  Dressing or bathing? N N  Doing errands, shopping? N N  Preparing Food and eating ? N N  Using the Toilet? N N  In the past six months, have you accidently leaked urine? Y N  Comment wears panty liner every night -  Do you have problems with loss of bowel control? N N  Managing your Medications? N N  Managing your Finances? N N  Housekeeping or managing  your Housekeeping? N N  Some recent data might be hidden   Patient Care Team: [name], MD as PCP - General (Pediatrics) Skeet Latch, MD as PCP - Cardiology (Cardiology) Kennith Center, RD as Dietitian (Family Medicine) Warden Fillers, MD as Consulting Physician (Ophthalmology) Gardiner Barefoot, DPM as Consulting Physician (Podiatry) Jon Billings, RN as [place] Management    Assessment:   This is a routine wellness examination for Ritu.  Exercise Activities and Dietary recommendations Current Exercise Habits: Home exercise routine, Type of exercise: strength training/weights, Time (Minutes): 10, Frequency (Times/Week): 7, Weekly Exercise (Minutes/Week): 70, Exercise limited by: None identified  Goals    .  Acknowledge receipt of Programme researcher, broadcasting/film/video    . Blood Pressure < 140/90    . HEMOGLOBIN A1C < 7.0    . Make and Keep All Appointments     Timeframe:  Long-Range Goal Priority:  High Start Date:    07/01/20                         Expected End Date:   01/27/21                    Follow Up Date 10/27/20   - call to cancel if needed - keep a calendar with appointment dates    Why is this important?    Part of staying healthy is seeing the doctor for follow-up care.   If you forget your appointments, there are some things you can do to stay on track.    Notes:     Marland Kitchen Monitor and Manage My Blood Sugar-Diabetes Type 2     Timeframe:  Long-Range Goal Priority:  High Start Date:   07/28/20                          Expected End Date:   01/27/21                    Follow Up Date 10/27/20    - check blood sugar at prescribed times - take the blood sugar log to all doctor visits    Why is this important?    Checking your blood sugar at home helps to keep it from getting very high or very low.   Writing the results in a diary or log helps the doctor know how to care for you.   Your blood sugar log should have the time, date and  the results.   Also, write down the amount of insulin or other medicine that you take.   Other information, like what you ate, exercise done and how you were feeling, will also be helpful.     Notes: 07/01/20 A1c 7.8      Fall Risk Fall Risk  09/05/2020 08/24/2020 08/11/2020 07/01/2020 01/12/2020  Falls in the past year? 0 0 0 0 0  Number falls in past yr: - 0 0 - -  Injury with Fall? - 0 0 - -  Risk Factor Category  - - - - -  Risk for fall due to : - - - - -  Follow up Falls prevention discussed - - - -   Is the patient's home free of loose throw rugs in walkways, pet beds, electrical cords, etc?   yes      Grab bars in the bathroom? yes      Handrails on the stairs?   yes      Adequate lighting?   yes  Patient rating of health (0-10) scale: 10  Depression Screen PHQ 2/9 Scores 09/05/2020 08/24/2020 08/11/2020 07/01/2020  PHQ - 2 Score 0 0 0 0  PHQ- 9 Score - 0 1 -    Cognitive Function 6CIT Screen 09/05/2020  What Year? 0 points  What month? 0 points  What time? 0 points  Count back from 20 0 points  Months in reverse 0 points  Repeat phrase 0 points  Total Score 0   Immunization History  Administered Date(s) Administered  . Fluad Quad(high Dose 65+) 01/19/2019  . H1N1 04/28/2008  . Influenza Split  02/02/2011, 05/12/2012  . Influenza Whole 02/28/2005, 03/06/2007, 04/28/2008, 02/28/2009, 03/01/2010  . Influenza,inj,Quad PF,6+ Mos 02/19/2013, 03/10/2014, 01/05/2015, 02/21/2016, 03/29/2017, 05/11/2020  . Influenza-Unspecified 04/08/2018  . PFIZER Comirnaty(Gray Top)Covid-19 Tri-Sucrose Vaccine 08/11/2020  . PFIZER(Purple Top)SARS-COV-2 Vaccination 07/01/2019, 07/30/2019, 04/06/2020  . Pneumococcal Conjugate-13 12/02/2014  . Pneumococcal Polysaccharide-23 03/06/2007, 11/21/2011  . Td 02/28/2005   Screening Tests Health Maintenance  Topic Date Due  . TETANUS/TDAP  03/01/2015  . OPHTHALMOLOGY EXAM  10/28/2020  . INFLUENZA VACCINE  11/28/2020  . COLONOSCOPY (Pts 45-48yrs  Insurance coverage will need to be confirmed)  02/03/2021  . HEMOGLOBIN A1C  02/10/2021  . FOOT EXAM  04/20/2021  . MAMMOGRAM  08/19/2021  . DEXA SCAN  Completed  . COVID-19 Vaccine  Completed  . Hepatitis C Screening  Completed  . PNA vac Low Risk Adult  Completed  . HPV VACCINES  Aged Out   Cancer Screenings: Lung: Low Dose CT Chest recommended if Age 97-80 years, 30 pack-year currently smoking OR have quit w/in 15years. Patient does not qualify. Breast:  Up to date on Mammogram? Yes   Up to date of Bone Density/Dexa? Yes Colorectal: UTD  Additional Screenings: Hepatitis C Screening: Completed   Plan:  Mammogram scheduled for 09/06/2020. PCP apt due ~11/10/2020. Continue walking in the mornings and stretching.  Ask for an advance directive packet at next PCP visit.   I have personally reviewed and noted the following in the patient's chart:   . Medical and social history . Use of alcohol, tobacco or illicit drugs  . Current medications and supplements . Functional ability and status . Nutritional status . Physical activity . Advanced directives . List of other physicians . Hospitalizations, surgeries, and ER visits in previous 12 months . Vitals . Screenings to include cognitive, depression, and falls . Referrals and appointments  In addition, I have reviewed and discussed with patient certain preventive protocols, quality metrics, and best practice recommendations. A written personalized care plan for preventive services as well as general preventive health recommendations were provided to patient.  This visit was conducted virtually in the setting of the [place] pandemic.    Dorna Bloom, [place]  09/05/2020

## 2020-09-05 NOTE — Patient Instructions (Addendum)
You spoke to Mallory Hayes, [place] over the phone for your annual wellness visit.  We discussed goals: Goals    .  Acknowledge receipt of Programme researcher, broadcasting/film/video    . Blood Pressure < 140/90    . HEMOGLOBIN A1C < 7.0    . Make and Keep All Appointments     Timeframe:  Long-Range Goal Priority:  High Start Date:    07/01/20                         Expected End Date:   01/27/21                    Follow Up Date 10/27/20   - call to cancel if needed - keep a calendar with appointment dates    Why is this important?    Part of staying healthy is seeing the doctor for follow-up care.   If you forget your appointments, there are some things you can do to stay on track.    Notes:     Marland Kitchen Monitor and Manage My Blood Sugar-Diabetes Type 2     Timeframe:  Long-Range Goal Priority:  High Start Date:   07/28/20                          Expected End Date:   01/27/21                    Follow Up Date 10/27/20    - check blood sugar at prescribed times - take the blood sugar log to all doctor visits    Why is this important?    Checking your blood sugar at home helps to keep it from getting very high or very low.   Writing the results in a diary or log helps the doctor know how to care for you.   Your blood sugar log should have the time, date and the results.   Also, write down the amount of insulin or other medicine that you take.   Other information, like what you ate, exercise done and how you were feeling, will also be helpful.     Notes: 07/01/20 A1c 7.8      We also discussed recommended health maintenance. Please call our office at the end of June and schedule a visit for July. As discussed, you are pretty much up to date with everything!   Health Maintenance  Topic Date Due  . TETANUS/TDAP  03/01/2015  . OPHTHALMOLOGY EXAM  10/28/2020  . INFLUENZA VACCINE  11/28/2020  . COLONOSCOPY (Pts 45-36yr Insurance coverage will need to be confirmed)  02/03/2021  . HEMOGLOBIN A1C   02/10/2021  . FOOT EXAM  04/20/2021  . MAMMOGRAM  08/19/2021  . DEXA SCAN  Completed  . COVID-19 Vaccine  Completed  . Hepatitis C Screening  Completed  . PNA vac Low Risk Adult  Completed  . HPV VACCINES  Aged Out   Mammogram scheduled for 09/06/2020. PCP apt due ~11/10/2020. Continue walking in the mornings and stretching.  Ask for an advance directive packet at next PCP visit.   Preventive Care 642Years and Older, Female Preventive care refers to lifestyle choices and visits with your health care provider that can promote health and wellness. This includes:  A yearly physical exam. This is also called an annual wellness visit.  Regular dental and eye exams.  Immunizations.  Screening for  certain conditions.  Healthy lifestyle choices, such as: ? Eating a healthy diet. ? Getting regular exercise. ? Not using drugs or products that contain nicotine and tobacco. ? Limiting alcohol use. What can I expect for my preventive care visit? Physical exam Your health care provider will check your:  Height and weight. These may be used to calculate your BMI (body mass index). BMI is a measurement that tells if you are at a healthy weight.  Heart rate and blood pressure.  Body temperature.  Skin for abnormal spots. Counseling Your health care provider may ask you questions about your:  Past medical problems.  Family's medical history.  Alcohol, tobacco, and drug use.  Emotional well-being.  Home life and relationship well-being.  Sexual activity.  Diet, exercise, and sleep habits.  History of falls.  Memory and ability to understand (cognition).  Work and work Statistician.  Pregnancy and menstrual history.  Access to firearms. What immunizations do I need? Vaccines are usually given at various ages, according to a schedule. Your health care provider will recommend vaccines for you based on your age, medical history, and lifestyle or other factors, such as travel  or where you work.   What tests do I need? Blood tests  Lipid and cholesterol levels. These may be checked every 5 years, or more often depending on your overall health.  Hepatitis C test.  Hepatitis B test. Screening  Lung cancer screening. You may have this screening every year starting at age 36 if you have a 30-pack-year history of smoking and currently smoke or have quit within the past 15 years.  Colorectal cancer screening. ? All adults should have this screening starting at age 5 and continuing until age 79. ? Your health care provider may recommend screening at age 30 if you are at increased risk. ? You will have tests every 1-10 years, depending on your results and the type of screening test.  Diabetes screening. ? This is done by checking your blood sugar (glucose) after you have not eaten for a while (fasting). ? You may have this done every 1-3 years.  Mammogram. ? This may be done every 1-2 years. ? Talk with your health care provider about how often you should have regular mammograms.  Abdominal aortic aneurysm (AAA) screening. You may need this if you are a current or former smoker.  BRCA-related cancer screening. This may be done if you have a family history of breast, ovarian, tubal, or peritoneal cancers. Other tests  STD (sexually transmitted disease) testing, if you are at risk.  Bone density scan. This is done to screen for osteoporosis. You may have this done starting at age 71. Talk with your health care provider about your test results, treatment options, and if necessary, the need for more tests. Follow these instructions at home: Eating and drinking  Eat a diet that includes fresh fruits and vegetables, whole grains, lean protein, and low-fat dairy products. Limit your intake of foods with high amounts of sugar, saturated fats, and salt.  Take vitamin and mineral supplements as recommended by your health care provider.  Do not drink alcohol if  your health care provider tells you not to drink.  If you drink alcohol: ? Limit how much you have to 0-1 drink a day. ? Be aware of how much alcohol is in your drink. In the [place] one drink equals one 12 oz bottle of beer (355 mL), one 5 oz glass of wine (148 mL), or one 1  oz glass of hard liquor (44 mL).   Lifestyle  Take daily care of your teeth and gums. Brush your teeth every morning and night with fluoride toothpaste. Floss one time each day.  Stay active. Exercise for at least 30 minutes 5 or more days each week.  Do not use any products that contain nicotine or tobacco, such as cigarettes, e-cigarettes, and chewing tobacco. If you need help quitting, ask your health care provider.  Do not use drugs.  If you are sexually active, practice safe sex. Use a condom or other form of protection in order to prevent STIs (sexually transmitted infections).  Talk with your health care provider about taking a low-dose aspirin or statin.  Find healthy ways to cope with stress, such as: ? Meditation, yoga, or listening to music. ? Journaling. ? Talking to a trusted person. ? Spending time with friends and family. Safety  Always wear your seat belt while driving or riding in a vehicle.  Do not drive: ? If you have been drinking alcohol. Do not ride with someone who has been drinking. ? When you are tired or distracted. ? While texting.  Wear a helmet and other protective equipment during sports activities.  If you have firearms in your house, make sure you follow all gun safety procedures. What's next?  Visit your health care provider once a year for an annual wellness visit.  Ask your health care provider how often you should have your eyes and teeth checked.  Stay up to date on all vaccines. This information is not intended to replace advice given to you by your health care provider. Make sure you discuss any questions you have with your health care provider. Document Revised:  04/06/2020 Document Reviewed: 04/10/2018 Elsevier Patient Education  2021 [place].   Our clinic's number is [phone]. Please call with questions or concerns about what we discussed today.

## 2020-09-06 ENCOUNTER — Other Ambulatory Visit: Payer: Self-pay

## 2020-09-06 ENCOUNTER — Ambulatory Visit
Admission: RE | Admit: 2020-09-06 | Discharge: 2020-09-06 | Disposition: A | Discharge status: Home / Self Care | Payer: Medicare HMO | Source: Ambulatory Visit | Attending: Family Medicine | Admitting: Family Medicine

## 2020-09-06 DIAGNOSIS — Z1231 Encounter for screening mammogram for malignant neoplasm of breast: Secondary | ICD-10-CM

## 2020-09-12 ENCOUNTER — Telehealth: Payer: Self-pay | Admitting: *Deleted

## 2020-09-12 MED ORDER — EMPAGLIFLOZIN 10 MG PO TABS: 10.0000 mg | ORAL_TABLET | Freq: Every day | ORAL | 3 refills | Status: DC

## 2020-09-12 MED ORDER — METFORMIN HCL ER 500 MG PO TB24: 500.0000 mg | ORAL_TABLET | Freq: Every day | ORAL | 3 refills | Status: DC

## 2020-09-12 MED ORDER — TRUE METRIX METER W/DEVICE KIT: PACK | 0 refills | Status: DC

## 2020-09-12 NOTE — Telephone Encounter (Signed)
Rx requst for true metrix blood glucose meter. Please advise.  Abbott Jasinski Kennon Holter, CMA

## 2020-09-12 NOTE — Telephone Encounter (Signed)
Called patient to discuss lab results. Renal function slightly worse from last check.  eGFR 38, CrCl 26.3  Recommend we make the following changes: -jardiance - decrease to 52m daily -metformin - decrease to one XR 5039mtablet daily  Patient agreeable with making these adjustments. She is aware that lowering the doses of her diabetes medications may necessitate going on insulin in order to get her sugars under control.  Sent in new rx's for her. Also new rx for meter as requested.  BrLeeanne RioMD

## 2020-09-12 NOTE — Progress Notes (Signed)
I have reviewed this visit and agree with the documentation.   

## 2020-09-13 ENCOUNTER — Telehealth: Payer: Self-pay

## 2020-09-13 NOTE — Telephone Encounter (Signed)
**  PREVIOUS SAMPLES OF JARDIANCE 25MG  WERE RETURNED. PT NEVER PICKED UP. PCP ALSO DECREASED DOSE.  Medication Samples have been provided to the patient.  Drug name: JARDIANCE       Strength: 10MG         Qty: 14  LOT: [id number]  Exp.Date: 01/27/2022  Dosing instructions: TAKE 1 TABLET ONCE DAILY  The patient has been instructed regarding the correct time, dose, and frequency of taking this medication, including desired effects and most common side effects.   Mallory Hayes 2:20 PM 09/13/2020

## 2020-09-20 NOTE — Progress Notes (Signed)
Cardiology Office Note   Date:  09/22/2020   ID:  Mallory Hayes, DOB [birth year], MRN [id number]  PCP:  [name], MD  Cardiologist:   Skeet Latch, MD   Chief Complaint  Patient presents with  . Follow-up    4-6 weeks.  . Chest Pain      History of Present Illness: Mallory Hayes is a 75 y.o. female hypertension, hyperlipidemia, OSA on CPAP, and diabetes who presents for follow up.  She was seen 12/2015 for an evaluation of bradycardia. Mallory Hayes notes that her heart rate has been low for years.  She saw her PCP and was noted that her heart rate was in the 46. She was referred to cardiology for evaluation. At the time she was active and had no exertional symptoms. Echocardiogram revealed LVEF 55 to 60% with grade 1 diastolic dysfunction.  Mallory Hayes reported an episode of exertional chest pain while doing yardwork.  She had a [employer] 10/2019 that was negative for ischemia.  She had an echo 10/219 that revealed LVEF 60-65% and grade 2 diastolic dysfunction.  She notes that she stopped taking rosuvastatin because she didn't want to take so many pills.  She reported some palpitations.  She wore an ambulatory monitor 01/2020 that revealed occasional PACs and PVCs.  There are episodes when she pressed her monitor in sinus rhythm with underlying.    She was switched to Tribenzor from lisinopril, amlodipine, and furosemide.  However she decided not to take it due to concern for potential side effects.  She is back on amlodipine, lisinopril, and furosemide. At her last appointment, she was willing to try HCTZ, and furosemide was discontinued.   09/22/2020 Today, she is having both good and bad days. She feels better when she goes out to exercise and will work in her yard. Last Friday her PCP lowered her Jardiance and Metformin due to her abnormal kidney function. She seems to be tolerating the HCTZ. Occasionally she wakes up in the morning with a pinpoint "pinching" in her central  chest, like a needle "sticking" in her chest. This occurs when she is still lying down, but dissipates after getting up and moving around for a time. She denies any shortness of breath, palpitations, or exertional symptoms. No headaches, lightheadedness, or syncope to report. Also has no lower extremity edema, orthopnea or PND.   Past Medical History:  Diagnosis Date  . Diabetes mellitus without complication ([place])   . GERD (gastroesophageal reflux disease)   . OSA (obstructive sleep apnea)   . Sleep apnea     Past Surgical History:  Procedure Laterality Date  . APPENDECTOMY    . BREAST EXCISIONAL BIOPSY Right    benign  . CHOLECYSTECTOMY    . COLONOSCOPY  2011  . PARTIAL HYSTERECTOMY  1991     Current Outpatient Medications  Medication Sig Dispense Refill  . amLODipine (NORVASC) 10 MG tablet TAKE 1 TABLET BY MOUTH EVERY DAY 90 tablet 3  . aspirin 81 MG tablet Take 81 mg by mouth daily.    . Blood Glucose Monitoring Suppl (TRUE METRIX METER) w/Device KIT Check blood sugar once daily 1 kit 0  . Blood Pressure Monitoring (BLOOD PRESSURE MONITOR AUTOMAT) DEVI Check blood pressure 3 times per week 1 Device 0  . Calcium Carbonate Antacid (TUMS PO) Take by mouth.    Water engineer Bandages & Supports (WRIST SPLINT/COCK-UP/LEFT M) MISC Wear daily 1 each 0  . empagliflozin (JARDIANCE) 10 MG TABS tablet Take  1 tablet (10 mg total) by mouth daily. 90 tablet 3  . fluticasone (FLONASE) 50 MCG/ACT nasal spray Place 2 sprays into both nostrils daily. 16 g 11  . hydrochlorothiazide (HYDRODIURIL) 12.5 MG tablet Take 12.5 mg by mouth daily.    Marland Kitchen lisinopril (ZESTRIL) 20 MG tablet Take 1 tablet (20 mg total) by mouth daily. 90 tablet 0  . metFORMIN (GLUCOPHAGE XR) 500 MG 24 hr tablet Take 1 tablet (500 mg total) by mouth daily with breakfast. 90 tablet 3  . Polyethylene Glycol 3350 (PEG 3350) 17 GM/SCOOP POWD Take 17g by mouth daily prn 850 g 1  . rosuvastatin (CRESTOR) 20 MG tablet Take 1 tablet (20 mg  total) by mouth daily. 90 tablet 3  . vitamin E 200 UNIT capsule Take by mouth.     No current facility-administered medications for this visit.    Allergies:   Amaryl [glimepiride], Food, and Sulfamethoxazole    Social History:  The patient  reports that she has never smoked. She has never used smokeless tobacco. She reports that she does not drink alcohol and does not use drugs.   Family History:  The patient's family history includes Cancer in her father and mother; Colon cancer (age of onset: 34) in her mother; Diabetes in her brother; Diabetes (age of onset: 65) in her mother; Hyperlipidemia in her mother; Hypertension in her mother; Ovarian cancer in her mother; Prostatitis in her brother.    ROS:   Please see the history of present illness. (+) "Pinching" pain in central chest All other systems are reviewed and negative.     PHYSICAL EXAM: VS:  BP 122/64 (BP Location: Right Arm, Patient Position: Sitting, Cuff Size: Normal)   Pulse (!) 47   Ht 5' 1"  (1.549 m)   Wt 104 lb (47.2 kg)   BMI 19.65 kg/m  , BMI Body mass index is 19.65 kg/m. GENERAL:  Well appearing HEENT: Pupils equal round and reactive, fundi not visualized, oral mucosa unremarkable NECK:  No jugular venous distention, waveform within normal limits, carotid upstroke brisk and symmetric, no bruits LUNGS:  Clear to auscultation bilaterally HEART:  Bradycardic.  Regular rhythm.  PMI not displaced or sustained,S1 and S2 within normal limits, no S3, no S4, no clicks, no rubs, II/VI systolic murmur ABD:  Flat, positive bowel sounds normal in frequency in pitch, no bruits, no rebound, no guarding, no midline pulsatile mass, no hepatomegaly, no splenomegaly EXT:  2 plus pulses throughout, no edema, no cyanosis no clubbing SKIN:  No rashes no nodules NEURO:  Cranial nerves II through XII grossly intact, motor grossly intact throughout PSYCH:  Cognitively intact, oriented to person place and time  EKG:   09/22/2020:  Sinus bradycardia. Rate 47 bpm. 03/22/2020: EKG was not ordered. 01/28/2020: Sinus bradycardia. Rate 49 bpm. 01/27/16: Sinus bradycardia. Rate 48 bpm.  Lexiscan Myoview 11/25/19:  There were transient T wave changes in the inferolateral leads during infusion that rapidly reolved in recovery  Nuclear stress EF: 58%.  The left ventricular ejection fraction is normal (55-65%).  The study is normal.  This is a low risk study.  Echo 02/03/2018: Study Conclusions   - Left ventricle: The cavity size was normal. Wall thickness was  normal. Systolic function was normal. The estimated ejection  fraction was in the range of 60% to 65%. Wall motion was normal;  there were no regional wall motion abnormalities. Features are  consistent with a pseudonormal left ventricular filling pattern,  with concomitant abnormal relaxation and  increased filling  pressure (grade 2 diastolic dysfunction).  - Aortic valve: Mildly calcified annulus. There was mild stenosis.  Valve area (VTI): 1.52 cm^2. Valve area (Vmax): 1.54 cm^2. Valve  area (Vmean): 1.65 cm^2.  - Mitral valve: There was mild regurgitation.  3-day ZIO monitor 01/2020:  Quality: Fair.  Baseline artifact. Predominant rhythm: Sinus bradycardia Average heart rate: 55 bpm Max heart rate: 139 bpm Min heart rate: 34 bpm Pauses >2.5 seconds: None  Occasional PVCs and PACs.    Patient did submit a symptom diary.  She pushed the button twice at which time sinus rhythm was noted.   Recent Labs: 08/11/2020: ALT 15 09/02/2020: BUN 30; Creatinine, Ser 1.44; Potassium 3.6; Sodium 135    Lipid Panel    Component Value Date/Time   CHOL 224 (H) 08/11/2020 1203   TRIG 95 08/11/2020 1203   HDL 82 08/11/2020 1203   CHOLHDL 2.7 08/11/2020 1203   CHOLHDL 1.8 12/02/2014 0952   VLDL 12 12/02/2014 0952   LDLCALC 126 (H) 08/11/2020 1203   LDLDIRECT 62 07/28/2012 0927      Wt Readings from Last 3 Encounters:  09/22/20 104 lb (47.2  kg)  08/24/20 105 lb 6.4 oz (47.8 kg)  08/11/20 107 lb (48.5 kg)     ASSESSMENT AND PLAN: Aortic stenosis, mild Stable.  She is euvolemic and has no symptoms.  We will repeat her echo at follow-up.    Essential hypertension Blood pressure has been much better controlled since adding hydrochlorothiazide.  Continue amlodipine, chlorothiazide, and lisinopril.  OSA (obstructive sleep apnea) Unable to tolerate CPAP.  Bradycardia Patient remains bradycardic but asymptomatic.  She exerts her self regularly and denies any dizziness.  Dyslipidemia Lipids are poorly controlled.  She intermittently stops taking her rosuvastatin.  Her last lipid report was poor.  She has started back taking it and will get follow-up with her PCP.   Current medicines are reviewed at length with the patient today.  The patient does not have concerns regarding medicines.  The following changes have been made:  no change  Labs/ tests ordered today include:   Orders Placed This Encounter  Procedures  . EKG 12-Lead     Disposition:   FU with Denessa Cavan C. Oval Linsey, MD, [facility] in 6 months.   This note was written with the assistance of speech recognition software.  Please excuse any transcriptional errors.  I,Mathew Stumpf,acting as a Education administrator for Skeet Latch, MD.,have documented all relevant documentation on the behalf of Skeet Latch, MD,as directed by  Skeet Latch, MD while in the presence of Skeet Latch, MD.  I, Blanchard Oval Linsey, MD have reviewed all documentation for this visit.  The documentation of the exam, diagnosis, procedures, and orders on 09/22/2020 are all accurate and complete.   Signed, Ketrina Boateng C. Oval Linsey, MD, [facility]  09/22/2020 6:21 PM    [place]

## 2020-09-22 ENCOUNTER — Other Ambulatory Visit: Payer: Self-pay

## 2020-09-22 ENCOUNTER — Ambulatory Visit (INDEPENDENT_AMBULATORY_CARE_PROVIDER_SITE_OTHER): Payer: Medicare HMO | Admitting: Cardiovascular Disease

## 2020-09-22 ENCOUNTER — Encounter: Payer: Self-pay | Admitting: Cardiovascular Disease

## 2020-09-22 DIAGNOSIS — E785 Hyperlipidemia, unspecified: Secondary | ICD-10-CM | POA: Diagnosis not present

## 2020-09-22 DIAGNOSIS — I10 Essential (primary) hypertension: Secondary | ICD-10-CM

## 2020-09-22 DIAGNOSIS — I350 Nonrheumatic aortic (valve) stenosis: Secondary | ICD-10-CM

## 2020-09-22 DIAGNOSIS — G4733 Obstructive sleep apnea (adult) (pediatric): Secondary | ICD-10-CM | POA: Diagnosis not present

## 2020-09-22 DIAGNOSIS — R001 Bradycardia, unspecified: Secondary | ICD-10-CM | POA: Diagnosis not present

## 2020-09-22 NOTE — Assessment & Plan Note (Signed)
Stable.  She is euvolemic and has no symptoms.  We will repeat her echo at follow-up.

## 2020-09-22 NOTE — Assessment & Plan Note (Signed)
Unable to tolerate CPAP 

## 2020-09-22 NOTE — Patient Instructions (Signed)
Medication Instructions:  Your physician recommends that you continue on your current medications as directed. Please refer to the Current Medication list given to you today.   *If you need a refill on your cardiac medications before your next appointment, please call your pharmacy*  Lab Work: NONE  Testing/Procedures: NONE  Follow-Up: At [facility], you and your health needs are our priority.  As part of our continuing mission to provide you with exceptional heart care, we have created designated Provider Care Teams.  These Care Teams include your primary Cardiologist (physician) and Advanced Practice Providers (APPs -  Physician Assistants and Nurse Practitioners) who all work together to provide you with the care you need, when you need it.  We recommend signing up for the patient portal called "MyChart".  Sign up information is provided on this After Visit Summary.  MyChart is used to connect with patients for Virtual Visits (Telemedicine).  Patients are able to view lab/test results, encounter notes, upcoming appointments, etc.  Non-urgent messages can be sent to your provider as well.   To learn more about what you can do with MyChart, go to [email or web address].    Your next appointment:   6 month(s)  The format for your next appointment:   In Person  Provider:   AT [facility] LOCATION

## 2020-09-22 NOTE — Assessment & Plan Note (Signed)
Lipids are poorly controlled.  She intermittently stops taking her rosuvastatin.  Her last lipid report was poor.  She has started back taking it and will get follow-up with her PCP.

## 2020-09-22 NOTE — Assessment & Plan Note (Signed)
Blood pressure has been much better controlled since adding hydrochlorothiazide.  Continue amlodipine, chlorothiazide, and lisinopril.

## 2020-09-22 NOTE — Assessment & Plan Note (Signed)
Patient remains bradycardic but asymptomatic.  She exerts her self regularly and denies any dizziness.

## 2020-09-30 ENCOUNTER — Other Ambulatory Visit: Payer: Self-pay

## 2020-09-30 NOTE — Patient Outreach (Signed)
[facility]) Care Management  09/30/2020  Mackayla Mullins 1945-06-27 [id number]   Telephone call to patient for disease management follow up.   No answer.  HIPAA compliant voice message left.    Plan: If no return call, RN CM will attempt patient again in the month of September.  [name], RN, MSN [facility] Care Management Coordinator Direct Line [phone] Cell [phone] Toll Free: [phone]  Fax: [phone]

## 2020-10-11 ENCOUNTER — Telehealth: Payer: Self-pay

## 2020-10-11 NOTE — Telephone Encounter (Signed)
Patient calls nurse line asking to speak with [facility]. Patient would not give specific except you have been "talking back and forth." Will forward to [place].

## 2020-10-13 ENCOUNTER — Encounter: Payer: Self-pay | Admitting: Dietician

## 2020-10-13 ENCOUNTER — Encounter: Payer: Medicare HMO | Attending: Family Medicine | Admitting: Dietician

## 2020-10-13 DIAGNOSIS — E1169 Type 2 diabetes mellitus with other specified complication: Secondary | ICD-10-CM | POA: Diagnosis not present

## 2020-10-13 NOTE — Progress Notes (Signed)
Diabetes Self-Management Education  Visit Type: First/Initial  Appt. Start Time: 1400 Appt. End Time: 1500  10/13/2020  [name], identified by name and date of birth, is a 75 y.o. female with a diagnosis of Diabetes: Type 2.   ASSESSMENT  Visit was conducted via telephone. Pt identity was verified by two identifiers.  Pt is currently taking Jardiance and metformin for diabetes. Dosage was recently cut down by PCP due to altered kidney values (S3 CKD): eGFR - 41 (low), BUN - 30 (high), Creatinine - 1.37 (high). Pt will be returning to PCP for follow-up on kidney values in July. PCP advised pt they may need to go on insulin if they are unable to get their BG under control.  Pt does NOT check blood sugar, states they don't like needles and doesn't want to actually see that their number are high. Pt is hesitant to restart checking. Pt states they have felt like giving up at times, that they will never be able to get their BG under control. Pt reports weight of ~104 lbs, pt does not want to lose any more weight. Pt reports usually eating 3 meals a day, but may skip meals if they are tired.  Pt has an allergic reaction black pepper, spicy foods, dry spices (cloves, cinnamon, oregano, black tea) and tomato sauce. Pt does stretches every morning for about 15 minutes, then checks their feet, walks for 20 minutes and water their plants/do yardwork, and then eats breakfast.   Diabetes Self-Management Education - 10/13/20 1417       Visit Information   Visit Type First/Initial      Initial Visit   Diabetes Type Type 2    Are you currently following a meal plan? No    Are you taking your medications as prescribed? Yes    Date Diagnosed 2008      Psychosocial Assessment   Patient Belief/Attitude about Diabetes Afraid    Self-care barriers None    Self-management support Doctor's office    Other persons present Patient    Patient Concerns Weight Control;Nutrition/Meal planning     Special Needs None    Preferred Learning Style No preference indicated    Learning Readiness Ready    How often do you need to have someone help you when you read instructions, pamphlets, or other written materials from your doctor or pharmacy? 1 - Never    What is the last grade level you completed in school? High School      Pre-Education Assessment   Patient understands the diabetes disease and treatment process. Needs Instruction    Patient understands incorporating nutritional management into lifestyle. Needs Instruction    Patient undertands incorporating physical activity into lifestyle. Needs Instruction    Patient understands using medications safely. Needs Instruction    Patient understands monitoring blood glucose, interpreting and using results Needs Instruction    Patient understands prevention, detection, and treatment of acute complications. Needs Instruction    Patient understands prevention, detection, and treatment of chronic complications. Needs Instruction    Patient understands how to develop strategies to address psychosocial issues. Needs Instruction    Patient understands how to develop strategies to promote health/change behavior. Needs Instruction      Complications   Last HgB A1C per patient/outside source 9.3 %   08/11/2020   How often do you check your blood sugar? 0 times/day (not testing)    Are you checking your feet? Yes    How many days per week  are you checking your feet? 7      Dietary Intake   Breakfast 2 small glasses of water, 6 oz 1% milk, 1/2 banana, oatmeal w tsp olive oil, cup of coffee    Snack (morning) none    Lunch Chicken noodle soup, drained broth, added frozen okra and corn, can of tomato soup, diet ginger ale    Snack (afternoon) none    Dinner Two yellow corn humburger tacos, lettuce and tomato salad, light ranch dressing, diet ginger ale    Snack (evening) 3 butter cookies, bag of cheetos    Beverage(s) water, coffee, ginger ale       Exercise   Exercise Type ADL's;Light (walking / raking leaves)   stretches   How many days per week to you exercise? 7    How many minutes per day do you exercise? 20    Total minutes per week of exercise 140      Patient Education   Disease state  Factors that contribute to the development of diabetes    Physical activity and exercise  Role of exercise on diabetes management, blood pressure control and cardiac health.;Helped patient identify appropriate exercises in relation to his/her diabetes, diabetes complications and other health issue.    Medications Reviewed patients medication for diabetes, action, purpose, timing of dose and side effects.    Monitoring Purpose and frequency of SMBG.;Taught/discussed recording of test results and interpretation of SMBG.;Identified appropriate SMBG and/or A1C goals.   Encourage pt to begin SMBG, pt hesitant   Chronic complications Nephropathy, what it is, prevention of, the use of ACE, ARB's and early detection of through urine microalbumia.;Lipid levels, blood glucose control and heart disease;Assessed and discussed foot care and prevention of foot problems;Relationship between chronic complications and blood glucose control    Psychosocial adjustment Role of stress on diabetes;Worked with patient to identify barriers to care and solutions;Identified and addressed patients feelings and concerns about diabetes   Pt feels defeated     Individualized Goals (developed by patient)   Nutrition Follow meal plan discussed    Physical Activity Exercise 3-5 times per week;30 minutes per day    Medications take my medication as prescribed    Monitoring  test my blood glucose as discussed   As comfortable     Post-Education Assessment   Patient understands the diabetes disease and treatment process. Needs Review    Patient understands incorporating nutritional management into lifestyle. Needs Review    Patient undertands incorporating physical activity into  lifestyle. Needs Review    Patient understands using medications safely. Needs Review    Patient understands monitoring blood glucose, interpreting and using results Needs Review    Patient understands prevention, detection, and treatment of acute complications. Needs Review    Patient understands prevention, detection, and treatment of chronic complications. Needs Review    Patient understands how to develop strategies to address psychosocial issues. Needs Review    Patient understands how to develop strategies to promote health/change behavior. Needs Review      Outcomes   Expected Outcomes Demonstrated interest in learning. Expect positive outcomes    Future DMSE 4-6 wks    Program Status Not Completed             Individualized Plan for Diabetes Self-Management Training:   Learning Objective:  Patient will have a greater understanding of diabetes self-management. Patient education plan is to attend individual and/or group sessions per assessed needs and concerns.   Plan:   Patient  Instructions  Increase your intake of healthy fats from sources like nuts, seeds, plant based oils, avocado. Add in a small handful of low-salt, or unsalted nuts as a snack. Try some guacamole or hummus to dip your Triscuits in. Have a serving of 2 Tablespoons.  Switch to an oil-based salad dressing to increase your intake of healthy fats.  Work on increasing the intensity of your walks to help lower your blood sugar. Move your walks to 20-30 minutes after your breakfast.  When you snack, set aside a portion of your snacks to have, put the box away, and take your snack to another room to eat.  Expected Outcomes:  Demonstrated interest in learning. Expect positive outcomes  Education material provided: My Plate, Snack sheet, and Diabetes Resources  If problems or questions, patient to contact team via:  Phone and Email  Future DSME appointment: 4-6 wks

## 2020-10-13 NOTE — Patient Instructions (Addendum)
Increase your intake of healthy fats from sources like nuts, seeds, plant based oils, avocado. Add in a small handful of low-salt, or unsalted nuts as a snack. Try some guacamole or hummus to dip your Triscuits in. Have a serving of 2 Tablespoons.  Switch to an oil-based salad dressing to increase your intake of healthy fats.  Work on increasing the intensity of your walks to help lower your blood sugar. Move your walks to 20-30 minutes after your breakfast.  When you snack, set aside a portion of your snacks to have, put the box away, and take your snack to another room to eat.

## 2020-10-28 ENCOUNTER — Encounter: Payer: Self-pay | Admitting: Podiatry

## 2020-10-28 ENCOUNTER — Other Ambulatory Visit: Payer: Self-pay

## 2020-10-28 ENCOUNTER — Ambulatory Visit: Payer: Medicare HMO | Admitting: Podiatry

## 2020-10-28 DIAGNOSIS — B351 Tinea unguium: Secondary | ICD-10-CM

## 2020-10-28 DIAGNOSIS — M79674 Pain in right toe(s): Secondary | ICD-10-CM | POA: Diagnosis not present

## 2020-10-28 DIAGNOSIS — M79675 Pain in left toe(s): Secondary | ICD-10-CM

## 2020-10-28 DIAGNOSIS — E1169 Type 2 diabetes mellitus with other specified complication: Secondary | ICD-10-CM

## 2020-10-28 NOTE — Progress Notes (Signed)
This patient returns to my office for at risk foot care.  This patient requires this care by a professional since this patient will be at risk due to having CKD stage 3 and type 2 diabetes.   This patient is unable to cut nails herself since the patient cannot reach her nails.These nails are painful walking and wearing shoes.  This patient presents for at risk foot care today.  General Appearance  Alert, conversant and in no acute stress.  Vascular  Dorsalis pedis and posterior tibial  pulses are palpable  bilaterally.  Capillary return is within normal limits  bilaterally. Temperature is within normal limits  bilaterally.  Neurologic  Senn-Weinstein monofilament wire test within normal limits  bilaterally. Muscle power within normal limits bilaterally.  Nails Thick disfigured discolored nails with subungual debris  from hallux to fifth toes bilaterally. No evidence of bacterial infection or drainage bilaterally.  Orthopedic  No limitations of motion  feet .  No crepitus or effusions noted.  No bony pathology or digital deformities noted.  HAV  B/L and tailors bunion  B/L.  Skin  normotropic skin with no porokeratosis noted bilaterally.  No signs of infections or ulcers noted.     Onychomycosis  Pain in right toes  Pain in left toes  Consent was obtained for treatment procedures.   Mechanical debridement of nails 1-5  bilaterally performed with a nail nipper.  Filed with dremel without incident.    Return office visit     3 months                 Told patient to return for periodic foot care and evaluation due to potential at risk complications.   Shondra Capps DPM  

## 2020-11-30 ENCOUNTER — Other Ambulatory Visit: Payer: Self-pay

## 2020-11-30 ENCOUNTER — Encounter: Payer: Medicare HMO | Attending: Family Medicine | Admitting: Dietician

## 2020-11-30 ENCOUNTER — Encounter: Payer: Self-pay | Admitting: Dietician

## 2020-11-30 DIAGNOSIS — E1169 Type 2 diabetes mellitus with other specified complication: Secondary | ICD-10-CM | POA: Insufficient documentation

## 2020-11-30 NOTE — Progress Notes (Signed)
Diabetes Self-Management Education  Visit Type: Follow-up  Appt. Start Time: 1020 Appt. End Time: 1050  11/30/2020  Mallory Hayes, identified by name and date of birth, is a 75 y.o. female with a diagnosis of Diabetes: Type 2.   ASSESSMENT Visit was conducted via telephone. Pt identity was verified by two identifiers. Pt is still not checking blood sugars consistently. Pt states their finger hurts after pricking their finger and is afraid their numbers will be too high. Pt wants training on how to use a blood glucose meter. Pt wants to come in for an in-person visit for meter training. Pt has not weighed themselves, states they feel like their weight is fluctuating. Pt reports irregular sleep patterns, not sure the source of new sleep interruptions. Pt can only sleep a few hours at a time.  Pt is still doing body stretches each morning, has increased their walks from 20 to 30 minutes and upped their intensity.  There were no vitals taken for this visit. There is no height or weight on file to calculate BMI.   Diabetes Self-Management Education - 11/30/20 1034       Visit Information   Visit Type Follow-up      Initial Visit   Diabetes Type Type 2    Are you taking your medications as prescribed? Yes      Health Coping   How would you rate your overall health? Fair      Dietary Intake   Breakfast Oatmeal, 1/2 banana, 1/2 glass of milk, water    Lunch McChicken, 1/2 glass orange Hi-C    Dinner Chicken salad sandwich on a bun, steak fries    Snack (evening) Handful of Welch's fruit snacks, diet gingerale    Beverage(s) milk, water, Orange Hi-C, Diet gingerale      Exercise   Exercise Type ADL's;Light (walking / raking leaves)    How many days per week to you exercise? 5    How many minutes per day do you exercise? 30    Total minutes per week of exercise 150      Individualized Goals (developed by patient)   Nutrition Follow meal plan discussed    Medications take my  medication as prescribed    Monitoring  Other (comment)   Attend in-person follow up for meter training.     Patient Self-Evaluation of Goals - Patient rates self as meeting previously set goals (% of time)   Nutrition 25 - 50%    Physical Activity >75%    Medications >75%    Monitoring < 25%    Problem Solving < 25%    Reducing Risk < 25%    Health Coping < 25%      Post-Education Assessment   Patient understands the diabetes disease and treatment process. Needs Review    Patient understands incorporating nutritional management into lifestyle. Needs Review    Patient undertands incorporating physical activity into lifestyle. Demonstrates understanding / competency    Patient understands using medications safely. Demonstrates understanding / competency    Patient understands monitoring blood glucose, interpreting and using results Needs Instruction    Patient understands prevention, detection, and treatment of acute complications. Needs Review    Patient understands prevention, detection, and treatment of chronic complications. Needs Review    Patient understands how to develop strategies to address psychosocial issues. Needs Review    Patient understands how to develop strategies to promote health/change behavior. Needs Review      Outcomes   Expected  Outcomes Demonstrated interest in learning. Expect positive outcomes    Future DMSE 4-6 wks    Program Status Not Completed      Subsequent Visit   Since your last visit have you continued or begun to take your medications as prescribed? Yes    Since your last visit have you had your blood pressure checked? No    Since your last visit have you experienced any weight changes? No change    Since your last visit, are you checking your blood glucose at least once a day? No             Individualized Plan for Diabetes Self-Management Training:   Learning Objective:  Patient will have a greater understanding of diabetes  self-management. Patient education plan is to attend individual and/or group sessions per assessed needs and concerns.   Plan:   Patient Instructions  Come to your next visit in person for blood glucose meter training!  Keep an eye out for symptoms of high blood sugar that include extreme thirst and frequent urination.  Add in a tablespoon of peanut butter to your oatmeal.  Enjoy your carbs in moderation. Have 2-3 servings of carbs per meal. Each of these items are 1 serving (12T) of carbs:  1 slice of bread 1 cup (8oz) of low fat or skim milk 1/3 cup of pasta or rice 1/2 cup (4oz) of ornage juice 1 cup of watermelon  Expected Outcomes:  Demonstrated interest in learning. Expect positive outcomes  If problems or questions, patient to contact team via:  Phone and Email  Future DSME appointment: 4-6 wks

## 2020-11-30 NOTE — Patient Instructions (Addendum)
Come to your next visit in person for blood glucose meter training!  Keep an eye out for symptoms of high blood sugar that include extreme thirst and frequent urination.  Add in a tablespoon of peanut butter to your oatmeal.  Enjoy your carbs in moderation. Have 2-3 servings of carbs per meal. Each of these items are 1 serving (16X) of carbs:  1 slice of bread 1 cup (8oz) of low fat or skim milk 1/3 cup of pasta or rice 1/2 cup (4oz) of ornage juice 1 cup of watermelon

## 2020-12-07 ENCOUNTER — Encounter: Payer: Medicare HMO | Admitting: Obstetrics & Gynecology

## 2020-12-15 DIAGNOSIS — H25813 Combined forms of age-related cataract, bilateral: Secondary | ICD-10-CM | POA: Diagnosis not present

## 2020-12-15 DIAGNOSIS — H5022 Vertical strabismus, left eye: Secondary | ICD-10-CM | POA: Diagnosis not present

## 2020-12-15 DIAGNOSIS — H5000 Unspecified esotropia: Secondary | ICD-10-CM | POA: Diagnosis not present

## 2020-12-15 DIAGNOSIS — H43811 Vitreous degeneration, right eye: Secondary | ICD-10-CM | POA: Diagnosis not present

## 2020-12-15 DIAGNOSIS — E119 Type 2 diabetes mellitus without complications: Secondary | ICD-10-CM | POA: Diagnosis not present

## 2020-12-15 DIAGNOSIS — H532 Diplopia: Secondary | ICD-10-CM | POA: Diagnosis not present

## 2020-12-26 ENCOUNTER — Ambulatory Visit (INDEPENDENT_AMBULATORY_CARE_PROVIDER_SITE_OTHER): Payer: Medicare HMO | Admitting: Family Medicine

## 2020-12-26 ENCOUNTER — Ambulatory Visit (HOSPITAL_COMMUNITY)
Admission: RE | Admit: 2020-12-26 | Discharge: 2020-12-26 | Disposition: A | Discharge status: Home / Self Care | Payer: Medicare HMO | Source: Ambulatory Visit | Attending: Family Medicine | Admitting: Family Medicine

## 2020-12-26 ENCOUNTER — Encounter: Payer: Self-pay | Admitting: Family Medicine

## 2020-12-26 VITALS — BP 120/75 | HR 47 | Temp 96.9°F

## 2020-12-26 DIAGNOSIS — R0789 Other chest pain: Secondary | ICD-10-CM | POA: Diagnosis not present

## 2020-12-26 DIAGNOSIS — R5383 Other fatigue: Secondary | ICD-10-CM | POA: Insufficient documentation

## 2020-12-26 NOTE — Progress Notes (Signed)
    SUBJECTIVE:   CHIEF COMPLAINT / HPI:   Fatigue, chest discomfort, subjective fever Patient reports that for the last 3 days she has felt "off".  She reports a subjective fever and then she will become sweaty.  She also reports fatigue.  Initially felt like it may be related to her tooth that is causing her issues.  Denies any sick contacts.  Reports she is vaccinated and boosted in her last booster was approximately 4 months ago.  Denies any cough, sore throat, congestion.  Reports that she is also having this "funny" feeling in her chest that is not pain but it is bothersome.  It comes and goes.  It is not exacerbated by exercise or laying down.  She does report history of acid reflux which she takes Tums for.  She has not taken this for these symptoms.  PERTINENT  PMH / PSH: none  OBJECTIVE:   BP 120/75   Pulse (!) 47   Temp (!) 96.9 F (36.1 C) (Axillary)   SpO2 99%   General: Well-appearing 75 year old female, no acute distress HEENT: Erythematous nasal turbinates, enlarged, moderate rhinorrhea, unable to visualize posterior oropharyn Cardiac: Bradycardia in the 40s, regular rhythm, EKG showing sinus bradycardia Respiratory: Normal work of breathing, lungs clear to auscultation bilaterally MSK: No gross abnormality  ASSESSMENT/PLAN:   Fatigue Patient is feeling fatigued over the past 3 days.  Reports a "funny sensation" in her chest.  EKG was normal today.  Rhinorrhea and enlarged nasal turbinates noted.  May be viral URI.  Will test for COVID although patient is vaccinated and boosted.  Provided recommendations regarding supportive measures such as Tylenol for fever or pain, Flonase.  Provided strict ED and return precautions and patient was agreeable to this.  No further questions or concerns at this time.   Gifford Shave, MD [place]

## 2020-12-26 NOTE — Patient Instructions (Signed)
It was great seeing you today!  You may have a cold or some other viral illness.  We will go ahead and test you for COVID-19.  I will call you with those results.  I want you to use Flonase for the next 2 weeks and you can use Tylenol for fever or discomfort.  If your symptoms worsen or you have any congestion, shortness of breath, chest pain please seek medical attention immediately.  If you have any questions or concerns please call the clinic.  I hope you have a wonderful afternoon!

## 2020-12-26 NOTE — Assessment & Plan Note (Signed)
Patient is feeling fatigued over the past 3 days.  Reports a "funny sensation" in her chest.  EKG was normal today.  Rhinorrhea and enlarged nasal turbinates noted.  May be viral URI.  Will test for COVID although patient is vaccinated and boosted.  Provided recommendations regarding supportive measures such as Tylenol for fever or pain, Flonase.  Provided strict ED and return precautions and patient was agreeable to this.  No further questions or concerns at this time.

## 2020-12-27 LAB — SARS-COV-2, NAA 2 DAY TAT

## 2020-12-27 LAB — NOVEL CORONAVIRUS, NAA: SARS-CoV-2, NAA: NOT DETECTED

## 2020-12-28 ENCOUNTER — Other Ambulatory Visit: Payer: Self-pay

## 2020-12-28 NOTE — Patient Outreach (Signed)
[facility]) Care Management  12/28/2020  Mallory Hayes 10/05/1945 [id number]   Telephone call to patient for disease management follow up.   No answer.  HIPAA compliant voice message left.    Plan: If no return call, RN CM will attempt patient again in November.   [name], RN, MSN [facility] Care Management Coordinator Direct Line [phone] Cell [phone] Toll Free: [phone]  Fax: [phone]

## 2020-12-29 ENCOUNTER — Ambulatory Visit: Payer: Self-pay

## 2020-12-30 ENCOUNTER — Ambulatory Visit: Payer: Self-pay

## 2020-12-30 NOTE — Progress Notes (Signed)
Have been trying to process PAP application for jardiance for pt since April. Pt says she recently rec'd notice from the company that page 4 needed to be refaxed. I have done so today (for 10mg  jardiance). This was signed back in July by Dr. Ardelia Mems but was being held back since company needed other documentation from pt. Pt is in need of samples until we can get PAP approved.   Medication Samples have been provided to the patient.  Drug name: JARDIANCE       Strength: 10MG         Qty: 21 TABS (3 BOXES)  LOT: [id number]  Exp.Date: 08/2022  Dosing instructions: TAKE 1 DAILY   Talbert Cage Audie Stayer 11:50 AM 12/30/2020

## 2021-01-09 ENCOUNTER — Other Ambulatory Visit: Payer: Self-pay | Admitting: Family Medicine

## 2021-01-10 DIAGNOSIS — I129 Hypertensive chronic kidney disease with stage 1 through stage 4 chronic kidney disease, or unspecified chronic kidney disease: Secondary | ICD-10-CM | POA: Diagnosis not present

## 2021-01-10 DIAGNOSIS — Z23 Encounter for immunization: Secondary | ICD-10-CM | POA: Diagnosis not present

## 2021-01-10 DIAGNOSIS — N183 Chronic kidney disease, stage 3 unspecified: Secondary | ICD-10-CM | POA: Diagnosis not present

## 2021-01-10 DIAGNOSIS — E119 Type 2 diabetes mellitus without complications: Secondary | ICD-10-CM | POA: Diagnosis not present

## 2021-01-10 DIAGNOSIS — E1122 Type 2 diabetes mellitus with diabetic chronic kidney disease: Secondary | ICD-10-CM | POA: Diagnosis not present

## 2021-01-11 NOTE — Telephone Encounter (Signed)
LVM for patient to call [facility] and schedule an appointment.  Ozella Almond, [place]

## 2021-01-11 NOTE — Telephone Encounter (Signed)
Please let patient know I am refilling this medication, but she needs to schedule an appointment with me.   Thanks, Arlenis Blaydes J Lamoine Fredricksen, MD  

## 2021-01-17 ENCOUNTER — Encounter: Payer: Medicare HMO | Attending: Family Medicine | Admitting: Dietician

## 2021-01-17 ENCOUNTER — Other Ambulatory Visit: Payer: Self-pay

## 2021-01-17 ENCOUNTER — Encounter: Payer: Self-pay | Admitting: Dietician

## 2021-01-17 DIAGNOSIS — E1169 Type 2 diabetes mellitus with other specified complication: Secondary | ICD-10-CM | POA: Insufficient documentation

## 2021-01-17 NOTE — Progress Notes (Signed)
Diabetes Self-Management Education  Visit Type: Follow-up  Appt. Start Time: 1130 Appt. End Time: 1200  01/17/2021  Mallory Hayes, identified by name and date of birth, is a 75 y.o. female with a diagnosis of Diabetes:  .   ASSESSMENT Pt is present in the office for in-person visit today. Pt brought their meter to appointment. Rd provided instruction on how to use meter and prick their finger. Pt showed comprehension. CBG during visit was 216 mg/dL. Pt ate a pack a pack of PB Ritz crackers before visit. Pt is taking Metformin and Jardiance for their diabetes, pt applied for PAP for Jardiance and is waiting for it to begin. Pt reports they are afraid of checking their numbers because it's a reminder of the morbidity of diabetes. Pt states they feel better about checking their BG after getting training today. Pt reports going for a morning walk each day and states it helps them feel less worried and refreshed. Pt had a tooth removed recently and can only eat soft foods temporarily.   Height 5\' 1"  (1.549 m), weight 107 lb 12.8 oz (48.9 kg). Body mass index is 20.37 kg/m.   Diabetes Self-Management Education - 01/17/21 1100       Visit Information   Visit Type Follow-up      Complications   Postprandial Blood glucose range (mg/dL) >200      Dietary Intake   Breakfast glass of water, 6 oz 1% milk, 1 small banana, PB crackers, oatmeal, 1 scrambled egg    Lunch Garden peas, small bowl of potato salad, thin sliced baked ham    Snack (afternoon) Fruit snacks    Dinner Chicken pot pie, diet gingerale,      Exercise   Exercise Type ADL's;Light (walking / raking leaves)    How many days per week to you exercise? 5    How many minutes per day do you exercise? 30    Total minutes per week of exercise 150      Individualized Goals (developed by patient)   Monitoring  test my blood glucose as discussed;send in my blood glucose log as discussed      Patient Self-Evaluation of Goals -  Patient rates self as meeting previously set goals (% of time)   Nutrition < 25%    Physical Activity >75%    Medications >75%    Monitoring Not Applicable   Pt will start checking FBG as of today   Problem Solving < 25%    Reducing Risk 25 - 50%    Health Coping 25 - 50%      Post-Education Assessment   Patient understands the diabetes disease and treatment process. Needs Review    Patient understands incorporating nutritional management into lifestyle. Needs Review    Patient undertands incorporating physical activity into lifestyle. Demonstrates understanding / competency    Patient understands using medications safely. Demonstrates understanding / competency    Patient understands monitoring blood glucose, interpreting and using results Needs Review    Patient understands prevention, detection, and treatment of acute complications. Needs Review    Patient understands prevention, detection, and treatment of chronic complications. Needs Review    Patient understands how to develop strategies to address psychosocial issues. Needs Review    Patient understands how to develop strategies to promote health/change behavior. Needs Review      Outcomes   Expected Outcomes Demonstrated interest in learning. Expect positive outcomes    Future DMSE 4-6 wks    Program Status  Not Completed      Subsequent Visit   Since your last visit have you continued or begun to take your medications as prescribed? Yes    Since your last visit have you had your blood pressure checked? No    Since your last visit have you experienced any weight changes? Gain    Weight Gain (lbs) 2    Since your last visit, are you checking your blood glucose at least once a day? No   Pt received glucometer training today. Wil beging checking fasting daily            Individualized Plan for Diabetes Self-Management Training:   Learning Objective:  Patient will have a greater understanding of diabetes  self-management. Patient education plan is to attend individual and/or group sessions per assessed needs and concerns.   Plan:   Patient Instructions  Check your blood sugar each morning before you eat or drink anything. Look for your numbers to begin to get closet to being under 125. This the goal for your fasting blood sugar.  If you feel symptoms of high blood sugar (extreme thirst, frequent urination) check your blood sugar. If it is high, have a glass of water and go for a walk. If your blood sugar is over 300, call your doctor.  Have some apple slices or a half of a banana with peanut butter instead of peanut butter crackers.  If you have any fruit juice, only have a 4 oz glass.  Expected Outcomes:  Demonstrated interest in learning. Expect positive outcomes  Education material provided: Glucometer training  If problems or questions, patient to contact team via:  Phone and Email  Future DSME appointment: 4-6 wks

## 2021-01-17 NOTE — Patient Instructions (Addendum)
Check your blood sugar each morning before you eat or drink anything. Look for your numbers to begin to get closet to being under 125. This the goal for your fasting blood sugar.  If you feel symptoms of high blood sugar (extreme thirst, frequent urination) check your blood sugar. If it is high, have a glass of water and go for a walk. If your blood sugar is over 300, call your doctor.  Have some apple slices or a half of a banana with peanut butter instead of peanut butter crackers.  If you have any fruit juice, only have a 4 oz glass.

## 2021-01-20 NOTE — Progress Notes (Signed)
Received notification from [employer]) regarding approval for JARDIANCE 10MG . Patient assistance approved from 12/31/20 to 04/29/21.  MEDICATION WILL SHIP TO PT'S HOME  Phone: [phone]

## 2021-02-01 ENCOUNTER — Encounter: Payer: Self-pay | Admitting: Podiatry

## 2021-02-01 ENCOUNTER — Other Ambulatory Visit: Payer: Self-pay

## 2021-02-01 ENCOUNTER — Ambulatory Visit (INDEPENDENT_AMBULATORY_CARE_PROVIDER_SITE_OTHER): Payer: Medicare HMO | Admitting: Podiatry

## 2021-02-01 DIAGNOSIS — M79675 Pain in left toe(s): Secondary | ICD-10-CM | POA: Diagnosis not present

## 2021-02-01 DIAGNOSIS — M79674 Pain in right toe(s): Secondary | ICD-10-CM | POA: Diagnosis not present

## 2021-02-01 DIAGNOSIS — E1169 Type 2 diabetes mellitus with other specified complication: Secondary | ICD-10-CM

## 2021-02-01 DIAGNOSIS — B351 Tinea unguium: Secondary | ICD-10-CM

## 2021-02-01 NOTE — Progress Notes (Signed)
This patient returns to my office for at risk foot care.  This patient requires this care by a professional since this patient will be at risk due to having CKD stage 3 and type 2 diabetes.   This patient is unable to cut nails herself since the patient cannot reach her nails.These nails are painful walking and wearing shoes.  This patient presents for at risk foot care today.  General Appearance  Alert, conversant and in no acute stress.  Vascular  Dorsalis pedis and posterior tibial  pulses are palpable  bilaterally.  Capillary return is within normal limits  bilaterally. Temperature is within normal limits  bilaterally.  Neurologic  Senn-Weinstein monofilament wire test within normal limits  bilaterally. Muscle power within normal limits bilaterally.  Nails Thick disfigured discolored nails with subungual debris  from hallux to fifth toes bilaterally. No evidence of bacterial infection or drainage bilaterally.  Orthopedic  No limitations of motion  feet .  No crepitus or effusions noted.  No bony pathology or digital deformities noted.  HAV  B/L and tailors bunion  B/L.  Skin  normotropic skin with no porokeratosis noted bilaterally.  No signs of infections or ulcers noted.     Onychomycosis  Pain in right toes  Pain in left toes  Consent was obtained for treatment procedures.   Mechanical debridement of nails 1-5  bilaterally performed with a nail nipper.  Filed with dremel without incident.    Return office visit     3 months                 Told patient to return for periodic foot care and evaluation due to potential at risk complications.   Dago Jungwirth DPM  

## 2021-02-03 ENCOUNTER — Ambulatory Visit (INDEPENDENT_AMBULATORY_CARE_PROVIDER_SITE_OTHER): Payer: Medicare HMO | Admitting: Obstetrics & Gynecology

## 2021-02-03 ENCOUNTER — Other Ambulatory Visit: Payer: Self-pay

## 2021-02-03 ENCOUNTER — Encounter: Payer: Self-pay | Admitting: Obstetrics & Gynecology

## 2021-02-03 VITALS — BP 110/62 | HR 60 | Resp 16 | Ht 59.75 in | Wt 106.0 lb

## 2021-02-03 DIAGNOSIS — M8588 Other specified disorders of bone density and structure, other site: Secondary | ICD-10-CM

## 2021-02-03 DIAGNOSIS — Z90710 Acquired absence of both cervix and uterus: Secondary | ICD-10-CM | POA: Diagnosis not present

## 2021-02-03 DIAGNOSIS — Z01419 Encounter for gynecological examination (general) (routine) without abnormal findings: Secondary | ICD-10-CM | POA: Diagnosis not present

## 2021-02-03 DIAGNOSIS — Z780 Asymptomatic menopausal state: Secondary | ICD-10-CM | POA: Diagnosis not present

## 2021-02-03 NOTE — Progress Notes (Addendum)
Mallory Hayes [birth year] [id number]   History:    75 y.o. G0  RP:  New patient presenting for annual gyn exam   HPI:  S/P Total Hysterectomy with unilateral Oophorectomy for Uterine Fibroids.  No pelvic pain.  Abstinent.  Breasts normal.  Mother with Breast Ca.  Urine/BMs normal. Health labs with Fam MD.  BD AP Lumbar Spine T-Score -1.3 in 2016.  Colono 2015.  BMI 20.88.  Past medical history,surgical history, family history and social history were all reviewed and documented in the EPIC chart.  Gynecologic History No LMP recorded. Patient has had a hysterectomy.  Obstetric History OB History  Gravida Para Term Preterm AB Living  0 0 0 0 0 0  SAB IAB Ectopic Multiple Live Births  0 0 0 0       ROS: A ROS was performed and pertinent positives and negatives are included in the history.  GENERAL: No fevers or chills. HEENT: No change in vision, no earache, sore throat or sinus congestion. NECK: No pain or stiffness. CARDIOVASCULAR: No chest pain or pressure. No palpitations. PULMONARY: No shortness of breath, cough or wheeze. GASTROINTESTINAL: No abdominal pain, nausea, vomiting or diarrhea, melena or bright red blood per rectum. GENITOURINARY: No urinary frequency, urgency, hesitancy or dysuria. MUSCULOSKELETAL: No joint or muscle pain, no back pain, no recent trauma. DERMATOLOGIC: No rash, no itching, no lesions. ENDOCRINE: No polyuria, polydipsia, no heat or cold intolerance. No recent change in weight. HEMATOLOGICAL: No anemia or easy bruising or bleeding. NEUROLOGIC: No headache, seizures, numbness, tingling or weakness. PSYCHIATRIC: No depression, no loss of interest in normal activity or change in sleep pattern.     Exam:   BP 110/62   Pulse 60   Resp 16   Ht 4' 11.75" (1.518 m)   Wt 106 lb (48.1 kg)   BMI 20.88 kg/m   Body mass index is 20.88 kg/m.  General appearance : Well developed well nourished female. No acute distress HEENT: Eyes: no retinal hemorrhage or  exudates,  Neck supple, trachea midline, no carotid bruits, no thyroidmegaly Lungs: Clear to auscultation, no rhonchi or wheezes, or rib retractions  Heart: Regular rate and rhythm, no murmurs or gallops Breast:Examined in sitting and supine position were symmetrical in appearance, no palpable masses or tenderness,  no skin retraction, no nipple inversion, no nipple discharge, no skin discoloration, no axillary or supraclavicular lymphadenopathy Abdomen: no palpable masses or tenderness, no rebound or guarding Extremities: no edema or skin discoloration or tenderness  Pelvic: Vulva: Normal             Vagina: No gross lesions or discharge  Cervix/Uterus absent  Adnexa  Without masses or tenderness  Anus: Normal   Assessment/Plan:  75 y.o. female for annual exam   1. Well female exam with routine gynecological exam Gynecologic exam status post total hysterectomy and menopause.  No indication for Pap test at this time.  Breast exam normal.  Screening mammogram May 2022 was negative.  Colonoscopy 2015.  Health labs with family physician.  Body mass index 20.88.  Continue with fitness and healthy nutrition.  2. S/P total hysterectomy  3. Postmenopause Well on no hormone replacement therapy. - DG Bone Density; Future  4. Osteopenia of lumbar spine Osteopenia of the lumbar spine with a T score of -1.3 on bone density in 2016.  Will schedule a bone density here now.  Vitamin D supplements, calcium 1.5 g/day total and regular weightbearing physical activities. - DG Bone Density; Future  Other orders - chlorthalidone (HYGROTON) 25 MG tablet   Princess Bruins MD, 2:04 PM 02/03/2021

## 2021-02-09 ENCOUNTER — Ambulatory Visit (INDEPENDENT_AMBULATORY_CARE_PROVIDER_SITE_OTHER): Payer: Medicare HMO

## 2021-02-09 ENCOUNTER — Other Ambulatory Visit: Payer: Self-pay

## 2021-02-09 ENCOUNTER — Ambulatory Visit (INDEPENDENT_AMBULATORY_CARE_PROVIDER_SITE_OTHER): Payer: Medicare HMO | Admitting: Family Medicine

## 2021-02-09 VITALS — BP 100/60 | HR 47 | Wt 109.8 lb

## 2021-02-09 DIAGNOSIS — Z23 Encounter for immunization: Secondary | ICD-10-CM | POA: Diagnosis not present

## 2021-02-09 DIAGNOSIS — E1169 Type 2 diabetes mellitus with other specified complication: Secondary | ICD-10-CM

## 2021-02-09 DIAGNOSIS — I10 Essential (primary) hypertension: Secondary | ICD-10-CM | POA: Diagnosis not present

## 2021-02-09 LAB — POCT GLYCOSYLATED HEMOGLOBIN (HGB A1C): HbA1c, POC (controlled diabetic range): 9.8 % — AB (ref 0.0–7.0)

## 2021-02-09 MED ORDER — TETANUS-DIPHTH-ACELL PERTUSSIS 5-2.5-18.5 LF-MCG/0.5 IM SUSY: 0.5000 mL | PREFILLED_SYRINGE | Freq: Once | INTRAMUSCULAR | 0 refills | Status: AC

## 2021-02-09 NOTE — Patient Instructions (Addendum)
Try AM Lactin cream on your hand Take Tdap (tetanus) prescription to your pharmacy to get it COVID booster today  See Ernst Bowler next week as scheduled to work on diabetes medications  Be well, Dr. Ardelia Mems

## 2021-02-09 NOTE — Progress Notes (Signed)
  Date of Visit: 02/09/2021   SUBJECTIVE:   HPI:  Mallory Hayes presents today for routine follow up.  Hypertension - taking amlodipine 10mg  daily, chlorthalidone 25mg  daily, lisinopril 20mg  daily. Tolerating these well. Denies dizziness or lightheadedness.  Diabetes - currently taking metformin XR 500mg  daily and jardiance 10mg  daily. Has been working with nutritionist.  Callus of R hand - desires assistance with callus on palm of R hand   OBJECTIVE:   BP 100/60   Pulse (!) 47   Wt 109 lb 12.8 oz (49.8 kg)   SpO2 100%   BMI 21.62 kg/m  Gen: no acute distress, pleasant cooperative HEENT: normocephalic, atraumatic  Heart: regular rate and rhythm, no murmur Lungs: clear to auscultation bilaterally, normal work of breathing  Neuro: alert speech normal grossly nonfocal Ext: No appreciable lower extremity edema bilaterally. Mild small callus formation on palm of R hand overlying 3rd/4th MCP joints  ASSESSMENT/PLAN:   Health maintenance:  -gave rx for Tdap to get at her pharmacy -bivalent COVID booster given today -declines shingles rx for now -will request records from eye exam ([place]) -discuss updating colonoscopy at next visit  Essential hypertension Well controlled. Continue current medication regimen. Blood pressure noted slightly soft but patient asymptomatic, will continue present medications.  Type 2 diabetes mellitus ([place]) Uncontrolled with A1c of 9.8. patient working on lifestyle changes and working with nutritionist. Karen Kays to uptitrate metformin or jardiance due to CKD Will schedule her with [facility] to discuss starting GLP-1. Patient is very very hesitant to resume insulin but would potentially be willing to do long acting GLP-1  Calluses of hand Recommend trial of over the counter Amlactin to help get rid of calluses  FOLLOW UP: Follow up in 1 week for diabetes with Alfredo Martinez. Ardelia Mems, [place]

## 2021-02-13 NOTE — Assessment & Plan Note (Signed)
Well controlled. Continue current medication regimen. Blood pressure noted slightly soft but patient asymptomatic, will continue present medications.

## 2021-02-13 NOTE — Assessment & Plan Note (Signed)
Uncontrolled with A1c of 9.8. patient working on lifestyle changes and working with nutritionist. Mallory Hayes to uptitrate metformin or jardiance due to CKD Will schedule her with [facility] to discuss starting GLP-1. Patient is very very hesitant to resume insulin but would potentially be willing to do long acting GLP-1

## 2021-02-15 ENCOUNTER — Other Ambulatory Visit: Payer: Self-pay

## 2021-02-15 ENCOUNTER — Ambulatory Visit (INDEPENDENT_AMBULATORY_CARE_PROVIDER_SITE_OTHER): Payer: Medicare HMO | Admitting: Pharmacist

## 2021-02-15 DIAGNOSIS — E1169 Type 2 diabetes mellitus with other specified complication: Secondary | ICD-10-CM

## 2021-02-15 NOTE — Assessment & Plan Note (Signed)
T2DM is not controlled likely due to sub-optimal diet, hesitancy surrounding medication changes, and patient not checking her blood glucose. Medication adherence appears optimal. Extensively discussed benefits of GLP1 initiation and after answering all of patient's questions she was reluctant to starting additional medication and wished to work on lifestyle with new dietitian she is seeing. Patient feels that if she improved her diet she can improve her blood glucose readings without medication. Patient amendable to try this for one month, testing her blood glucose daily, and if demonstrated hyperglycemic readings will allow addition of GLP1 at next office visit.   Following instruction patient verbalized understanding of treatment plan.    1. Continued Jardiance 10mg  once daily 2. Continued metformin 500mg  once daily 3. Extensively discussed pathophysiology of diabetes, dietary effects on blood sugar control, and recommended lifestyle interventions. 4. Patient will adhere to dietary modifications 5. Counseled on s/sx of and management of hypoglycemia 6. Next A1C anticipated January 2023.  7. Discussed with patient she may administer rosuvastatin in morning to see if this minimizes nighttime side effects of vivid dreams and restlessness  Follow-up appointment 6 weeks to review sugar readings. Written patient instructions provided.

## 2021-02-15 NOTE — Patient Instructions (Signed)
Ms. Rhue it was a pleasure seeing you today.   Please do the following:  Continue your medications as directed today during your appointment. If you have any questions or if you believe something has occurred because of this change, call me or your doctor to let one of [place] know.  Continue checking blood sugars at home. It's really important that you record these and bring these in to your next doctor's appointment.  Continue making the lifestyle changes we've discussed together during our visit. Diet and exercise play a significant role in improving your blood sugars.  Follow-up with me on November 30th at 1:30.    Hypoglycemia or low blood sugar:   Low blood sugar can happen quickly and may become an emergency if not treated right away.   While this shouldn't happen often, it can be brought upon if you skip a meal or do not eat enough. Also, if your insulin or other diabetes medications are dosed too high, this can cause your blood sugar to go to low.   Warning signs of low blood sugar include: Feeling shaky or dizzy Feeling weak or tired  Excessive hunger Feeling anxious or upset  Sweating even when you aren't exercising  What to do if I experience low blood sugar? Follow the Rule of 15 Check your blood sugar with your meter. If lower than 70, proceed to step 2.  Treat with 15 grams of fast acting carbs which is found in 3-4 glucose tablets. If none are available you can try hard candy, 1 tablespoon of sugar or honey,4 ounces of fruit juice, or 6 ounces of REGULAR soda.  Re-check your sugar in 15 minutes. If it is still below 70, do what you did in step 2 again. If your blood sugar has come back up, go ahead and eat a snack or small meal made up of complex carbs (ex. Whole grains) and protein at this time to avoid recurrence of low blood sugar.

## 2021-02-15 NOTE — Progress Notes (Signed)
Subjective:    Patient ID: Mallory Hayes, female    DOB: October 19, 1945, 75 y.o.   MRN: [id number]  HPI Patient is a 75 y.o. female who presents for diabetes management. She is in good spirits and presents without assistance. Patient was referred and last seen by Primary Care Provider on 02/09/21  Patient reports diabetes was diagnosed in 2008.   Insurance coverage/medication affordability: [employer]  Current diabetes medications include: metformin 500mg  once daily, Jardiance 10mg  once daily Current hypertension medications include: amlodipine 10mg , lisinopril 20mg  Current hyperlipidemia medications include: rosuvastatin 20mg ; patient reports rosuvastatin causes her to feel restless and have vivid dreams at night Patient states that She is taking her medications as prescribed. Patient reports adherence with medications.   Do you feel that your medications are working for you?  yes  Have you been experiencing any side effects to the medications prescribed? no  Do you have any problems obtaining medications due to transportation or finances?  no     Patient reported dietary habits:  Eats 3 meals/day and 1-2 snacks/day Breakfast: oatmeal (with low cal sugar), slice of banana, 6 oz of 1% milk, egg Lunch: Stage manager: vegetable, frozen pot pie Snacks: Ritz crackers, cookies, candy Drinks:orange juice, diet gingerale, water  Patient-reported exercise habits: 30 minutes daily   Patient denies hypoglycemic events. Patient denies polyuria (increased urination).  Patient denies polyphagia (increased appetite).  Patient reports polydipsia (increased thirst).  Patient denies neuropathy (nerve pain). Patient denies visual changes. Patient reports self foot exams.   Does not like checking her blood glucose because "numbers scare me"  Objective:   Labs:   Physical Exam Neurological:     Mental Status: She is alert and oriented to person, place, and time.     Lab Results   Component Value Date   HGBA1C 9.8 (A) 02/09/2021   HGBA1C 9.3 (A) 08/11/2020   HGBA1C 7.8 (A) 02/03/2020    Lipid Panel     Component Value Date/Time   CHOL 224 (H) 08/11/2020 1203   TRIG 95 08/11/2020 1203   HDL 82 08/11/2020 1203   CHOLHDL 2.7 08/11/2020 1203   CHOLHDL 1.8 12/02/2014 0952   VLDL 12 12/02/2014 0952   LDLCALC 126 (H) 08/11/2020 1203   LDLDIRECT 62 07/28/2012 0927    Assessment/Plan:   T2DM is not controlled likely due to sub-optimal diet, hesitancy surrounding medication changes, and patient not checking her blood glucose. Medication adherence appears optimal. Extensively discussed benefits of GLP1 initiation and after answering all of patient's questions she was reluctant to starting additional medication and wished to work on lifestyle with new dietitian she is seeing. Patient feels that if she improved her diet she can improve her blood glucose readings without medication. Patient amendable to try this for one month, testing her blood glucose daily, and if demonstrated hyperglycemic readings will allow addition of GLP1 at next office visit.   Following instruction patient verbalized understanding of treatment plan.    Continued Jardiance 10mg  once daily Continued metformin 500mg  once daily Extensively discussed pathophysiology of diabetes, dietary effects on blood sugar control, and recommended lifestyle interventions. Patient will adhere to dietary modifications Counseled on s/sx of and management of hypoglycemia Next A1C anticipated January 2023.  Discussed with patient she may administer rosuvastatin in morning to see if this minimizes nighttime side effects of vivid dreams and restlessness  Follow-up appointment 6 weeks to review sugar readings. Written patient instructions provided.  This appointment required 40 minutes of direct patient care.  Thank you for involving pharmacy to assist in providing this patient's care.  Patient seen with Elyse Jarvis,  PharmD Candidate.

## 2021-02-20 ENCOUNTER — Encounter: Payer: Self-pay | Admitting: Gastroenterology

## 2021-02-24 DIAGNOSIS — N183 Chronic kidney disease, stage 3 unspecified: Secondary | ICD-10-CM | POA: Diagnosis not present

## 2021-02-24 DIAGNOSIS — I129 Hypertensive chronic kidney disease with stage 1 through stage 4 chronic kidney disease, or unspecified chronic kidney disease: Secondary | ICD-10-CM | POA: Diagnosis not present

## 2021-03-07 ENCOUNTER — Ambulatory Visit: Payer: Medicare HMO | Admitting: Dietician

## 2021-03-15 ENCOUNTER — Other Ambulatory Visit: Payer: Self-pay

## 2021-03-15 DIAGNOSIS — I129 Hypertensive chronic kidney disease with stage 1 through stage 4 chronic kidney disease, or unspecified chronic kidney disease: Secondary | ICD-10-CM | POA: Diagnosis not present

## 2021-03-15 DIAGNOSIS — N183 Chronic kidney disease, stage 3 unspecified: Secondary | ICD-10-CM | POA: Diagnosis not present

## 2021-03-15 NOTE — Patient Outreach (Signed)
Mallory Hayes) Care Management  03/15/2021  Mallory Hayes April 27, 1946 [id number]   Telephone call to patient for disease management follow up.   No answer.  HIPAA compliant voice message left.    Plan: If no return call, RN CM will attempt patient again in the month of February.  [name], RN, MSN [place] [facility] Care Management Coordinator Direct Line [phone] Cell [phone] Toll Free: [phone]  Fax: [phone]

## 2021-03-21 ENCOUNTER — Other Ambulatory Visit: Payer: Self-pay

## 2021-03-21 NOTE — Patient Outreach (Signed)
[place] [facility]) Care Management  03/21/2021  Mallory Hayes February 10, 1946 [id number]   Return call to patient for disease management follow up. No answer.  HIPAA compliant voice message left.   Plan: RN CM will plan to outreach patient as previously scheduled if no return call.    [name], RN, MSN [place] Management Care Management Coordinator Direct Line [phone] Cell [phone] Toll Free: [phone]  Fax: [phone]

## 2021-03-29 ENCOUNTER — Encounter: Payer: Medicare HMO | Attending: Family Medicine | Admitting: Dietician

## 2021-03-29 ENCOUNTER — Ambulatory Visit: Payer: Medicare HMO | Admitting: Pharmacist

## 2021-03-29 DIAGNOSIS — E1169 Type 2 diabetes mellitus with other specified complication: Secondary | ICD-10-CM | POA: Insufficient documentation

## 2021-04-05 ENCOUNTER — Ambulatory Visit: Payer: Medicare HMO | Admitting: Pharmacist

## 2021-04-19 ENCOUNTER — Other Ambulatory Visit: Payer: Self-pay

## 2021-04-19 ENCOUNTER — Other Ambulatory Visit: Payer: Self-pay | Admitting: Obstetrics & Gynecology

## 2021-04-19 ENCOUNTER — Ambulatory Visit (INDEPENDENT_AMBULATORY_CARE_PROVIDER_SITE_OTHER): Payer: Medicare HMO

## 2021-04-19 DIAGNOSIS — Z780 Asymptomatic menopausal state: Secondary | ICD-10-CM

## 2021-04-19 DIAGNOSIS — M8589 Other specified disorders of bone density and structure, multiple sites: Secondary | ICD-10-CM | POA: Diagnosis not present

## 2021-04-19 DIAGNOSIS — M8588 Other specified disorders of bone density and structure, other site: Secondary | ICD-10-CM

## 2021-05-02 DIAGNOSIS — E1122 Type 2 diabetes mellitus with diabetic chronic kidney disease: Secondary | ICD-10-CM | POA: Diagnosis not present

## 2021-05-02 DIAGNOSIS — N184 Chronic kidney disease, stage 4 (severe): Secondary | ICD-10-CM | POA: Diagnosis not present

## 2021-05-02 DIAGNOSIS — R3915 Urgency of urination: Secondary | ICD-10-CM | POA: Diagnosis not present

## 2021-05-02 DIAGNOSIS — I509 Heart failure, unspecified: Secondary | ICD-10-CM | POA: Diagnosis not present

## 2021-05-02 DIAGNOSIS — R6883 Chills (without fever): Secondary | ICD-10-CM | POA: Diagnosis not present

## 2021-05-03 ENCOUNTER — Ambulatory Visit: Payer: Medicare HMO

## 2021-05-03 ENCOUNTER — Ambulatory Visit: Payer: Medicare HMO | Admitting: Dietician

## 2021-05-03 NOTE — Progress Notes (Deleted)
° ° °  SUBJECTIVE:   CHIEF COMPLAINT / HPI: hip and waist pain, requesting shot  ***  PERTINENT  PMH / PSH: ***  OBJECTIVE:   There were no vitals taken for this visit.  General: ***, NAD CV: RRR, no murmurs*** Pulm: CTAB, no wheezes or rales  ASSESSMENT/PLAN:   No problem-specific Assessment & Plan notes found for this encounter.     Zola Button, MD [place]   {    This will disappear when note is signed, click to select method of visit    :1}

## 2021-05-03 NOTE — Patient Instructions (Incomplete)
It was nice seeing you today! ° ° ° °Please arrive at least 15 minutes prior to your scheduled appointments. ° °Stay well, °Jeily Guthridge, MD °[facility] °[phone]  °

## 2021-05-10 ENCOUNTER — Ambulatory Visit (INDEPENDENT_AMBULATORY_CARE_PROVIDER_SITE_OTHER): Payer: Medicare HMO | Admitting: Family Medicine

## 2021-05-10 ENCOUNTER — Encounter: Payer: Self-pay | Admitting: Family Medicine

## 2021-05-10 ENCOUNTER — Other Ambulatory Visit: Payer: Self-pay

## 2021-05-10 VITALS — BP 159/54 | HR 56 | Ht 59.0 in | Wt 111.8 lb

## 2021-05-10 DIAGNOSIS — M545 Low back pain, unspecified: Secondary | ICD-10-CM | POA: Diagnosis not present

## 2021-05-10 DIAGNOSIS — I10 Essential (primary) hypertension: Secondary | ICD-10-CM | POA: Diagnosis not present

## 2021-05-10 DIAGNOSIS — E1169 Type 2 diabetes mellitus with other specified complication: Secondary | ICD-10-CM

## 2021-05-10 MED ORDER — BACLOFEN 10 MG PO TABS: 5.0000 mg | ORAL_TABLET | Freq: Two times a day (BID) | ORAL | 0 refills | Status: AC

## 2021-05-10 NOTE — Assessment & Plan Note (Signed)
BP elevated at 159>54, which remained elevated on repeat. Patient reports that she took her amlodipine but not her lisinopril this morning (which was recently increased by nephrologist).  - Instructed patient to closely monitor at home - Patient to take her Lisinopril when she gets home

## 2021-05-10 NOTE — Progress Notes (Signed)
° ° °  SUBJECTIVE:   CHIEF COMPLAINT / HPI:   Patient initially made the appointment due to right sided back/hip pain that she was having, but she ultimately went to an urgent care yesterday and was given Robaxin, which she says has gotten rid of her pain.   She would like to have her blood pressure checked today, she just had her Lisinopril dose increased and has not yet taken it today because she wasn't sure if it was making her urinate more.   PERTINENT  PMH / PSH: Reviewed  OBJECTIVE:   BP (!) 159/54    Pulse (!) 56    Ht 4\' 11"  (1.499 m)    Wt 111 lb 12.8 oz (50.7 kg)    SpO2 99%    BMI 22.58 kg/m   General: NAD, well-appearing, well-nourished Respiratory: No respiratory distress, breathing comfortably, able to speak in full sentences Skin: warm and dry, no rashes noted on exposed skin Psych: Appropriate affect and mood MSK: mildly TTP over the right paraspinal muscles without palpable abnormality. Patient with full ROM of the trunk without issue.   Diabetic Foot Exam - Simple   Simple Foot Form Diabetic Foot exam was performed with the following findings: Yes 05/10/2021  2:10 PM  Visual Inspection No deformities, no ulcerations, no other skin breakdown bilaterally: Yes Sensation Testing Intact to touch and monofilament testing bilaterally: Yes Pulse Check Posterior Tibialis and Dorsalis pulse intact bilaterally: Yes Comments      ASSESSMENT/PLAN:   Essential hypertension BP elevated at 159>54, which remained elevated on repeat. Patient reports that she took her amlodipine but not her lisinopril this morning (which was recently increased by nephrologist).  - Instructed patient to closely monitor at home - Patient to take her Lisinopril when she gets home  Type 2 diabetes mellitus ([place]) Patient is compliant with her medications. Foot exam was completed today.   Acute right lower back pain No pain on examination today, likely MSK related. Given age and kidney disease,  prefer to not use the previously prescribed Robaxin. Discussed with patient, who was agreeable and switched to renally-dosed Baclofen for a short course. Patient given return precautions.   Rise Patience, [place]

## 2021-05-10 NOTE — Patient Instructions (Addendum)
It was so great seeing you today! Today we discussed the following:  -Your blood pressure was little bit elevated today, make sure you take your lisinopril when you get home.  -I am going to send in a different medication called baclofen for your back pain.  I want you to stop taking the methocarbamol.  Please let our office know if you have any issues with the medication.  Please make sure to bring any medications you take to your appointments. If you have any questions or concerns please call the office at [phone].

## 2021-05-10 NOTE — Assessment & Plan Note (Signed)
Patient is compliant with her medications. Foot exam was completed today.

## 2021-05-24 ENCOUNTER — Ambulatory Visit: Payer: Medicare HMO | Admitting: Podiatry

## 2021-05-25 NOTE — Progress Notes (Signed)
Received notification from [employer]) regarding approval for [employer]. Patient assistance approved from 05/23/21 to 04/29/22.  PT CAN CALL COMPANY FOR REFILLS USING RX INFO IN SHIPMENT  Phone: [phone]

## 2021-06-06 ENCOUNTER — Other Ambulatory Visit: Payer: Self-pay

## 2021-06-06 ENCOUNTER — Encounter: Payer: Self-pay | Admitting: Podiatry

## 2021-06-06 ENCOUNTER — Ambulatory Visit (INDEPENDENT_AMBULATORY_CARE_PROVIDER_SITE_OTHER): Payer: Medicare HMO | Admitting: Podiatry

## 2021-06-06 DIAGNOSIS — M79674 Pain in right toe(s): Secondary | ICD-10-CM

## 2021-06-06 DIAGNOSIS — B351 Tinea unguium: Secondary | ICD-10-CM

## 2021-06-06 DIAGNOSIS — E1169 Type 2 diabetes mellitus with other specified complication: Secondary | ICD-10-CM

## 2021-06-06 DIAGNOSIS — M79675 Pain in left toe(s): Secondary | ICD-10-CM | POA: Diagnosis not present

## 2021-06-06 NOTE — Progress Notes (Signed)
This patient returns to my office for at risk foot care.  This patient requires this care by a professional since this patient will be at risk due to having CKD stage 3 and type 2 diabetes.   This patient is unable to cut nails herself since the patient cannot reach her nails.These nails are painful walking and wearing shoes.  This patient presents for at risk foot care today.  General Appearance  Alert, conversant and in no acute stress.  Vascular  Dorsalis pedis and posterior tibial  pulses are palpable  bilaterally.  Capillary return is within normal limits  bilaterally. Temperature is within normal limits  bilaterally.  Neurologic  Senn-Weinstein monofilament wire test within normal limits  bilaterally. Muscle power within normal limits bilaterally.  Nails Thick disfigured discolored nails with subungual debris  from hallux to fifth toes bilaterally. No evidence of bacterial infection or drainage bilaterally.  Orthopedic  No limitations of motion  feet .  No crepitus or effusions noted.  No bony pathology or digital deformities noted.  HAV  B/L and tailors bunion  B/L.  Skin  normotropic skin with no porokeratosis noted bilaterally.  No signs of infections or ulcers noted.     Onychomycosis  Pain in right toes  Pain in left toes  Consent was obtained for treatment procedures.   Mechanical debridement of nails 1-5  bilaterally performed with a nail nipper.  Filed with dremel without incident.    Return office visit     3 months                 Told patient to return for periodic foot care and evaluation due to potential at risk complications.   Karl Knarr DPM  

## 2021-06-09 ENCOUNTER — Other Ambulatory Visit: Payer: Self-pay

## 2021-06-09 NOTE — Patient Outreach (Signed)
Mallory Hayes [facility]) Care Management  06/09/2021  Mallory Hayes 09/04/1945 [id number]   Telephone Assessment    Unsuccessful quarterly outreach attempt to patient.     Plan: Assigned RN CM will make quarterly outreach attempt to patient within the month of May.  Enzo Montgomery, RN,BSN,CCM [place] Management Telephonic Care Management Coordinator Direct Phone: [phone] Toll Free: [phone] Fax: [phone]

## 2021-06-13 ENCOUNTER — Ambulatory Visit: Payer: Self-pay

## 2021-06-21 ENCOUNTER — Ambulatory Visit: Payer: Self-pay

## 2021-07-13 ENCOUNTER — Other Ambulatory Visit: Payer: Self-pay

## 2021-07-13 NOTE — Patient Outreach (Signed)
[place] [facility]) Care Management ? ?07/13/2021 ? ?[name] ?[birth year] ?[id number] ? ? ?Telephone call to patient for disease management follow up.   No answer.  HIPAA compliant voice message left.   ? ?Plan: If no return call, RN CM will attempt patient again in the month of May. ? ?[name], RN, MSN ?[facility] Care Management ?Care Management Coordinator ?Direct Line [phone] ?Toll Free: [phone]  ?Fax: [phone] ? ?

## 2021-07-14 ENCOUNTER — Ambulatory Visit (INDEPENDENT_AMBULATORY_CARE_PROVIDER_SITE_OTHER): Payer: Medicare HMO | Admitting: Family Medicine

## 2021-07-14 ENCOUNTER — Other Ambulatory Visit: Payer: Self-pay

## 2021-07-14 ENCOUNTER — Other Ambulatory Visit (HOSPITAL_COMMUNITY)
Admission: RE | Admit: 2021-07-14 | Discharge: 2021-07-14 | Disposition: A | Discharge status: Home / Self Care | Payer: Medicare HMO | Source: Ambulatory Visit | Attending: Family Medicine | Admitting: Family Medicine

## 2021-07-14 VITALS — BP 140/70 | HR 53 | Ht 59.0 in | Wt 114.1 lb

## 2021-07-14 DIAGNOSIS — Z7689 Persons encountering health services in other specified circumstances: Secondary | ICD-10-CM | POA: Diagnosis not present

## 2021-07-14 DIAGNOSIS — E1169 Type 2 diabetes mellitus with other specified complication: Secondary | ICD-10-CM | POA: Diagnosis not present

## 2021-07-14 DIAGNOSIS — R81 Glycosuria: Secondary | ICD-10-CM | POA: Diagnosis not present

## 2021-07-14 DIAGNOSIS — Z113 Encounter for screening for infections with a predominantly sexual mode of transmission: Secondary | ICD-10-CM

## 2021-07-14 DIAGNOSIS — N898 Other specified noninflammatory disorders of vagina: Secondary | ICD-10-CM | POA: Insufficient documentation

## 2021-07-14 DIAGNOSIS — N949 Unspecified condition associated with female genital organs and menstrual cycle: Secondary | ICD-10-CM | POA: Diagnosis not present

## 2021-07-14 DIAGNOSIS — B379 Candidiasis, unspecified: Secondary | ICD-10-CM | POA: Diagnosis not present

## 2021-07-14 LAB — POCT GLYCOSYLATED HEMOGLOBIN (HGB A1C): HbA1c, POC (controlled diabetic range): 10.9 % — AB (ref 0.0–7.0)

## 2021-07-14 LAB — POCT URINALYSIS DIP (MANUAL ENTRY)
Bilirubin, UA: NEGATIVE
Blood, UA: NEGATIVE
Glucose, UA: >=1000 mg/dL — AB
Ketones, POC UA: NEGATIVE mg/dL
Leukocytes, UA: NEGATIVE
Nitrite, UA: NEGATIVE
Protein Ur, POC: NEGATIVE mg/dL
Spec Grav, UA: <=1.005 — AB (ref 1.010–1.025)
Urobilinogen, UA: 0.2 E.U./dL
pH, UA: 5 (ref 5.0–8.0)

## 2021-07-14 MED ORDER — FLUCONAZOLE 150 MG PO TABS: 150.0000 mg | ORAL_TABLET | Freq: Once | ORAL | 0 refills | Status: AC

## 2021-07-14 NOTE — Progress Notes (Signed)
? ? ?SUBJECTIVE:  ? ?CHIEF COMPLAINT / HPI:  ? ?Sleep problem: Patient reports that she has had fatigue during the day that she think is due to her sleep problem.  She reports that she watches the news in bed and subsequently will fall asleep around 8 or 8:30 PM.  She then will wake up from anywhere between midnight to 4 AM and reports great difficulty falling back to sleep.  She then is up for the day at about 6 AM and cannot fall back to sleep after that.  She is interested in seeing a "sleep doctor."  When she wakes up in the middle of the night she will watch TV to try to fall back to sleep.  She has not tried anything else to help fall asleep.  She denies history of snoring. ? ?Vaginal discomfort: Patient reports that she has had slight vaginal itching and discomfort that feels a little raw on her labia.  This has been present for about a week.  She has also noticed increased thirst, and urination.  She is currently on Jardiance 10 mg daily and metformin 500 mg daily.  She has not been sexually active in about 7 years.  She has no history of STIs; in 2019 she had testing that was negative for an GC/CT, trichomonas, candida vaginitis. ? ?PERTINENT  PMH / PSH: DM2 ? ?OBJECTIVE:  ? ?BP 140/70   Pulse (!) 53   Ht '4\' 11"'$  (1.499 m)   Wt 114 lb 2 oz (51.8 kg)   SpO2 98%   BMI 23.05 kg/m?   ?Nursing note and vitals reviewed ?GEN: Elderly, AAW, resting comfortably in chair, NAD, thin appearing ?Cardiac: Regular rate and rhythm. Normal S1/S2. No murmurs, rubs, or gallops appreciated. 2+ radial pulses. ?Lungs: Clear bilaterally to ascultation. No increased WOB, no accessory muscle usage. No w/r/r. ?PELVIC:  Normal appearing external female genitalia, pale vaginal epithelium, thick white discharge. Normal appearing cervix. ?Neuro: AOx3  ?Ext: no edema ?Psych: Pleasant and appropriate  ? ?ASSESSMENT/PLAN:  ? ?Sleep concern ?Patient presents with insomnia with trouble staying asleep. She does have history of OSA,  previously had sleep study done in 2016 and 2017, but reportedly stopped using her CPAP as she could not tolerate it. She also does not have good sleep hygiene as she watches tv when she falls asleep, as well as when she wakes up. She also goes to sleep early, so if she would like to sleep later, we discussed staying active and only getting into bed when she desires to fall asleep. We also discussed not watching any screens in bed, nor w/in 1 hour of bed time, and doing something like reading a book with soft, warm lighting and not looking at any screen if she wakes up. Since patient does not wish to wear a CPAP, unclear what benefit referral to pulmonology/sleep medicine would be at this time. However, could consider referral if patient would like to try CPAP again or if no improvement with sleep hygiene. Follow up in 3-4 weeks. ? ?Type 2 diabetes mellitus ([place]) ?Chronic, uncontrolled. Adherent with medications as above. UA with >1000 glucose, likely due to jardiance. Likely the cause of current symptoms, see below. Last A1c in 10/22 was 9.8%, today it has worsened to 10.9%. Will obtain BMP, r/o DKA and severe hyperglycemia. Discussed with patient that she will need increased treatment for diabetes and that increasing metformin alone will likely not be quite enough to get DM2 to controlled range. Recommend injectable such  as long-acting insuling or GLP-1 (favor GLP-1 for safety and ease of use) at this time. Patient has also a reported weight loss, which makes insulin more favorable, however review of flow sheets shows that she is currently up 5-8 lbs since last fall. Patient would like more time to consider her options and read about the medications. Follow up with Dr. Valentina Lucks scheduled for 07/25/21, recommend she also follow up with PCP, Dr. Ardelia Mems as well.  ? ?Vaginal itching ?Post menopausal patient with vaginal itching in setting of SGLT2i use and uncontrolled DM2. UA with no e/o infection, >1000 glucose.  Obtained aptima swab for vaginitis as lab cannot do wet prep today. Will treat empirically with diflucan for high clinical suspicion of candida vaginitis.  ?  ? ? ?Gladys Damme, MD ?[place]  ? ?

## 2021-07-14 NOTE — Patient Instructions (Addendum)
It was a pleasure to see you today! ? ?We will get some labs today.  If they are abnormal or we need to do something about them, I will call you.  If they are normal, I will send you a message on MyChart (if it is active) or a letter in the mail.  If you don't hear from [place] in 2 weeks, please call the office  [phone]. ?For your sleep: I recommend you try melatonin before going to sleep to help with staying asleep. I also recommend trying sleep hygiene: if you wake up in the middle of the night, do not turn on any screens, but listen to music, read a book, have a cup of warm uncaffeinated tea, etc. Screens can keep you awake ?Follow up with Dr Valentina Lucks on march 30 ?Take fluconazole once for your yeast infection ? ? ?Be Well, ? ?Dr. Chauncey Reading ? ?

## 2021-07-15 LAB — BASIC METABOLIC PANEL
BUN/Creatinine Ratio: 20 (ref 12–28)
BUN: 23 mg/dL (ref 8–27)
CO2: 23 mmol/L (ref 20–29)
Calcium: 9.9 mg/dL (ref 8.7–10.3)
Chloride: 99 mmol/L (ref 96–106)
Creatinine, Ser: 1.13 mg/dL — ABNORMAL HIGH (ref 0.57–1.00)
Glucose: 211 mg/dL — ABNORMAL HIGH (ref 70–99)
Potassium: 4.6 mmol/L (ref 3.5–5.2)
Sodium: 138 mmol/L (ref 134–144)
eGFR: 51 mL/min/{1.73_m2} — ABNORMAL LOW (ref >59)

## 2021-07-17 DIAGNOSIS — N898 Other specified noninflammatory disorders of vagina: Secondary | ICD-10-CM | POA: Insufficient documentation

## 2021-07-17 NOTE — Assessment & Plan Note (Signed)
Post menopausal patient with vaginal itching in setting of SGLT2i use and uncontrolled DM2. UA with no e/o infection, >1000 glucose. Obtained aptima swab for vaginitis as lab cannot do wet prep today. Will treat empirically with diflucan for high clinical suspicion of candida vaginitis.  ?

## 2021-07-17 NOTE — Assessment & Plan Note (Signed)
Patient presents with insomnia with trouble staying asleep. She does have history of OSA, previously had sleep study done in 2016 and 2017, but reportedly stopped using her CPAP as she could not tolerate it. She also does not have good sleep hygiene as she watches tv when she falls asleep, as well as when she wakes up. She also goes to sleep early, so if she would like to sleep later, we discussed staying active and only getting into bed when she desires to fall asleep. We also discussed not watching any screens in bed, nor w/in 1 hour of bed time, and doing something like reading a book with soft, warm lighting and not looking at any screen if she wakes up. Since patient does not wish to wear a CPAP, unclear what benefit referral to pulmonology/sleep medicine would be at this time. However, could consider referral if patient would like to try CPAP again or if no improvement with sleep hygiene. Follow up in 3-4 weeks. ?

## 2021-07-17 NOTE — Assessment & Plan Note (Signed)
Chronic, uncontrolled. Adherent with medications as above. UA with >1000 glucose, likely due to jardiance. Likely the cause of current symptoms, see below. Last A1c in 10/22 was 9.8%, today it has worsened to 10.9%. Will obtain BMP, r/o DKA and severe hyperglycemia. Discussed with patient that she will need increased treatment for diabetes and that increasing metformin alone will likely not be quite enough to get DM2 to controlled range. Recommend injectable such as long-acting insuling or GLP-1 (favor GLP-1 for safety and ease of use) at this time. Patient has also a reported weight loss, which makes insulin more favorable, however review of flow sheets shows that she is currently up 5-8 lbs since last fall. Patient would like more time to consider her options and read about the medications. Follow up with Dr. Valentina Lucks scheduled for 07/25/21, recommend she also follow up with PCP, Dr. Ardelia Mems as well.  ?

## 2021-07-18 ENCOUNTER — Telehealth: Payer: Self-pay | Admitting: Family Medicine

## 2021-07-18 LAB — CERVICOVAGINAL ANCILLARY ONLY
Bacterial Vaginitis (gardnerella): NEGATIVE
Candida Glabrata: NEGATIVE
Candida Vaginitis: NEGATIVE
Chlamydia: NEGATIVE
Comment: NEGATIVE
Comment: NEGATIVE
Comment: NEGATIVE
Comment: NEGATIVE
Comment: NEGATIVE
Comment: NORMAL
Neisseria Gonorrhea: NEGATIVE
Trichomonas: NEGATIVE

## 2021-07-18 NOTE — Telephone Encounter (Signed)
Attempted to call patient to discuss recent labs. Blood work was all very reassuring. No DKA, glucose elevated but not to very acutely worrisome level. She needs to follow up with PCP and with Dr. Valentina Lucks for DM medication as it is currently uncontrolled. ? ?Aptima swab showed no vaginal infection, yeast negative. Is her vaginal itching improved? ? ?If patient calls back, you can tell her above. Also need to know if she has a working glucometer at home. If not, can send her supply kit. She needs to check her fasting blood glucose every morning. If it is above 300, she should call our office for a closer follow up than next week.  ? ?Patient is also overdue for colonoscopy, please remind her, I can place referral if needed. ? ?Gladys Damme, MD ?[facility], PGY-3 ? ?

## 2021-07-21 ENCOUNTER — Other Ambulatory Visit: Payer: Self-pay | Admitting: Cardiovascular Disease

## 2021-07-27 ENCOUNTER — Ambulatory Visit (INDEPENDENT_AMBULATORY_CARE_PROVIDER_SITE_OTHER): Payer: Medicare HMO | Admitting: Pharmacist

## 2021-07-27 ENCOUNTER — Encounter: Payer: Self-pay | Admitting: Pharmacist

## 2021-07-27 DIAGNOSIS — I119 Hypertensive heart disease without heart failure: Secondary | ICD-10-CM | POA: Diagnosis not present

## 2021-07-27 DIAGNOSIS — E1169 Type 2 diabetes mellitus with other specified complication: Secondary | ICD-10-CM | POA: Diagnosis not present

## 2021-07-27 MED ORDER — TRULICITY 0.75 MG/0.5ML ~~LOC~~ SOAJ: 0.7500 mg | SUBCUTANEOUS | 1 refills | Status: DC

## 2021-07-27 NOTE — Patient Instructions (Addendum)
Nice to see you today! ? ?Today, we've started a new medication called dulaglutide (Trulicity) 5.94 mg once weekly. Continue your other medications as directed. Call if you have any questions.  ? ?Your next appointment with Dr. Valentina Lucks will be April 27th at 11:15 AM.  ? ? ?

## 2021-07-27 NOTE — Assessment & Plan Note (Signed)
T2DM is not controlled likely due to sub-optimal diet, hesitancy surrounding medication changes, and patient not checking her blood glucose. Medication adherence appears optimal.  ?-Started GLP-1 Trulicity (dulaglutide) 0.75 mg weekly ?-Continued SGLT2-I Jardiance (empagliflozin) 10 mg daily. Counseled on sick day rules. ?-Continued metformin 500 mg XR every morning ?-Patient educated on purpose, proper use, and potential adverse effects of Trulicity.  ?-Extensively discussed pathophysiology of diabetes, recommended lifestyle interventions, dietary effects on blood sugar control.  ?-Counseled on s/sx of and management of hypoglycemia. ?

## 2021-07-27 NOTE — Progress Notes (Signed)
Reviewed: I agree with Dr. Graylin Shiver documentation and [place].   ?

## 2021-07-27 NOTE — Progress Notes (Signed)
? ? ?S:    ? ?Chief Complaint  ?Patient presents with  ? Medication Management  ?  Diabetes f/u  ? ?Mallory Hayes is a 76 y.o. female who presents for diabetes evaluation, education, and management. PMH is significant for type 2 diabetes. Patient was referred on 02/09/2021. Patient was last seen by Dr. Chauncey Reading on 07/14/2021. At last visit, the patient was continued on Jardiance and metformin. She also discussed lifestyle changes and potentially starting a GLP1 if this did not help her blood sugars.   ? ?Today, patient arrives in good spirits spirits and presents without assistance. She reports that her kidney doctor increased her lisinopril and discontinued her hydrochlorothiazide. She states that her poor sleeping habits and stress may be affecting her sugars and making them higher. When asked about stress, she said she has a lot of grief from losing her mother and her brother over the past few years. She says she lives alone and struggles to keep up with the house at times. She reports feeling overwhelmed, but says she is trying to learn to stay calm and do one thing at a time. She enjoys tending to her garden and said that this keeps her busy. She reports eating less on days that she stays busy in the garden.  ? ?Patient reports Diabetes was diagnosed in 2008.  ? ?Current diabetes medications include: empagliflozin (Jardiance) 10 mg daily, metformin 500 mg XR every morning  ?Current hypertension medications include: lisinopril 40 mg daily, amlodipine 10 mg daily  ?Current hyperlipidemia medications include: rosuvastatin 20 mg daily  ? ?Patient reports taking all medications as prescribed. Patient reports adherence with medications. Patient reports missing her medications one or two times per week, on average, but not every week. ? ?Do you feel that your medications are working for you? She does not feel like the metformin or empagliflozin are helping with her blood sugars. She's also concerned that the Jardiance  is putting sugar back into her body.  ?Have you been experiencing any side effects to the medications prescribed? no ?Do you have any problems obtaining medications due to transportation or finances? no ?Insurance coverage: [employer] ? ?Patient reports hypoglycemic events. She has not been checking her blood sugar at home, but she says she can tell when it is low as she notices times when she does not have a lot of energy. ? ?Patient reports nocturia (nighttime urination) 3-4 times a night. She reports that she has some incontinence on the way to the bathroom.  ?Patient reports polydipsia (increased thirst). ?Patient reports polyphagia (increased appetite). ?Patient denies neuropathy (nerve pain). ?Patient denies visual changes. ?Patient reports self foot exams every morning when she wakes up.  ? ?Patient reported dietary habits: Eats 3 meals/day ?Breakfast: oatmeal with half and half milk, olive oil and low calorie sugar; fried egg, glass of 1% milk and one cup of instant coffee (half and half and low calorie sugar) ?Lunch: Mc[employer]icken from [employer]  ?Dinner: frozen beef pie with broccoli spears  ?Snacks: she likes to eat sweets - wafers, whole wheat crackers with peanut butter that she says she probably eats too many of  ?Drinks: ginger ales x2 cans per night, Hi-C mixed with water x1 per day, water in the mornings and throughout the day ? ?Within the past 12 months, did you worry whether your food would run out before you got money to buy more? no ?Within the past 12 months, did the food you bought run out, and you didn?t have  money to get more? no ? ?Patient-reported exercise habits: walk every morning - 15 mins/day, stretching before walking, lots of gardening  ? ? ?O:  ?Physical Exam ?Constitutional:   ?   Appearance: Normal appearance. She is normal weight.  ?Neurological:  ?   Mental Status: She is alert.  ?Psychiatric:     ?   Behavior: Behavior normal.     ?   Thought Content: Thought content normal.      ?   Judgment: Judgment normal.  ? ? ?Review of Systems  ?All other systems reviewed and are negative. ? ? ?Lab Results  ?Component Value Date  ? HGBA1C 10.9 (A) 07/14/2021  ? ?Vitals:  ? 07/27/21 1346  ?BP: (!) 162/54  ?Pulse: (!) 52  ?SpO2: 100%  ? ? ?Lipid Panel  ?   ?Component Value Date/Time  ? CHOL 224 (H) 08/11/2020 1203  ? TRIG 95 08/11/2020 1203  ? HDL 82 08/11/2020 1203  ? CHOLHDL 2.7 08/11/2020 1203  ? CHOLHDL 1.8 12/02/2014 0952  ? VLDL 12 12/02/2014 0952  ? LDLCALC 126 (H) 08/11/2020 1203  ? LDLDIRECT 62 07/28/2012 0927  ? ? ?A/P: ?T2DM is not controlled likely due to sub-optimal diet, hesitancy surrounding medication changes, and patient not checking her blood glucose. Medication adherence appears optimal.  ?-Started GLP-1 Trulicity (dulaglutide) 0.75 mg weekly ?-Continued SGLT2-I Jardiance (empagliflozin) 10 mg daily. Counseled on sick day rules. ?-Continued metformin 500 mg XR every morning ?-Patient educated on purpose, proper use, and potential adverse effects of Trulicity.  ?-Extensively discussed pathophysiology of diabetes, recommended lifestyle interventions, dietary effects on blood sugar control.  ?-Counseled on s/sx of and management of hypoglycemia.  ?-Next A1c anticipated 09/2021.  ? ?Hypertension longstanding and currently uncontrolled with large pulse pressure due to low diastolic blood pressure. Medication adherence optimal. Blood pressure control is suboptimal due to age, longstanding disease. ?-Continue amlodipine 10 mg daily and lisinopril 40 mg daily  ? ?Written patient instructions provided. Patient verbalized understanding of treatment plan. Total time in face to face counseling 48 minutes.   ? ?Follow up pharmacist clinic visit on August 24, 2021. Patient seen with Zenaida Deed, PharmD, PGY 1 pharmacy resident. ?

## 2021-07-27 NOTE — Assessment & Plan Note (Signed)
Hypertension longstanding and currently uncontrolled with large pulse pressure due to low diastolic blood pressure. Medication adherence optimal. Blood pressure control is suboptimal due to age, longstanding disease. ?-Continue amlodipine 10 mg daily and lisinopril 40 mg daily  ?

## 2021-07-28 ENCOUNTER — Other Ambulatory Visit: Payer: Self-pay | Admitting: *Deleted

## 2021-07-28 MED ORDER — TRUE METRIX METER W/DEVICE KIT: PACK | 0 refills | Status: DC

## 2021-07-28 MED ORDER — TRUEPLUS LANCETS 33G MISC: 2 refills | Status: DC

## 2021-07-28 NOTE — Telephone Encounter (Signed)
Also trueplus 33g lancets. Delray Alt, CMA  ?

## 2021-08-01 ENCOUNTER — Other Ambulatory Visit: Payer: Self-pay | Admitting: Family Medicine

## 2021-08-08 ENCOUNTER — Other Ambulatory Visit: Payer: Self-pay | Admitting: Family Medicine

## 2021-08-08 DIAGNOSIS — Z1231 Encounter for screening mammogram for malignant neoplasm of breast: Secondary | ICD-10-CM

## 2021-08-09 ENCOUNTER — Encounter: Payer: Self-pay | Admitting: Gastroenterology

## 2021-08-14 ENCOUNTER — Ambulatory Visit (HOSPITAL_BASED_OUTPATIENT_CLINIC_OR_DEPARTMENT_OTHER): Payer: Medicare HMO | Admitting: Cardiovascular Disease

## 2021-08-14 NOTE — Progress Notes (Incomplete)
? ? ?Cardiology Office Note ? ? ?Date:  08/14/2021  ? ?ID:  Mallory Hayes, DOB 1945/11/09, MRN [id number] ? ?PCP:  Mallory Rio, MD  ?Cardiologist:   Mallory Hayes  ? ?No chief complaint on file. ? ?  ?History of Present Illness: ?Mallory Hayes is a 76 y.o. female hypertension, hyperlipidemia, OSA on CPAP, and diabetes who presents for follow up.  She was seen 12/2015 for an evaluation of bradycardia. Mallory Hayes notes that her heart rate has been low for years.  She saw her PCP and was noted that her heart rate was in the 46. She was referred to cardiology for evaluation. At the time she was active and had no exertional symptoms. Echocardiogram revealed LVEF 55 to 60% with grade 1 diastolic dysfunction.  Mallory Hayes reported an episode of exertional chest pain while doing yardwork.  She had a [employer] 10/2019 that was negative for ischemia.  She had an echo 10/219 that revealed LVEF 60-65% and grade 2 diastolic dysfunction.  She notes that she stopped taking rosuvastatin because she didn't want to take so many pills.  She reported some palpitations.  She wore an ambulatory monitor 01/2020 that revealed occasional PACs and PVCs.  There are episodes when she pressed her monitor in sinus rhythm with underlying.   ? ?She was switched to Tribenzor from lisinopril, amlodipine, and furosemide.  However she decided not to take it due to concern for potential side effects.  She is back on amlodipine, lisinopril, and furosemide. At her last appointment, she was willing to try HCTZ, and furosemide was discontinued.  ? ?At the last visit she reported atypical pinching chest pain. No ischemic evaluation was indicated. She remained bradycardic but asymptomatic. ? ?Today, ? ?*** denies any palpitations, chest pain, shortness of breath, or peripheral edema. No lightheadedness, headaches, syncope, orthopnea, or PND. ? ? ?Past Medical History:  ?Diagnosis Date  ? Diabetes mellitus without complication ([place])   ? GERD  (gastroesophageal reflux disease)   ? Hypertension   ? OSA (obstructive sleep apnea)   ? Sleep apnea   ? ? ?Past Surgical History:  ?Procedure Laterality Date  ? APPENDECTOMY    ? BREAST EXCISIONAL BIOPSY Right   ? benign  ? CHOLECYSTECTOMY    ? COLONOSCOPY  2011  ? PARTIAL HYSTERECTOMY  1991  ? ? ? ?Current Outpatient Medications  ?Medication Sig Dispense Refill  ? amLODipine (NORVASC) 10 MG tablet TAKE 1 TABLET BY MOUTH EVERY DAY 90 tablet 3  ? aspirin 81 MG tablet Take 81 mg by mouth daily.    ? Blood Glucose Monitoring Suppl (TRUE METRIX AIR GLUCOSE METER) w/Device KIT USE AS DIRECTED 1 kit 0  ? Blood Pressure Monitoring (BLOOD PRESSURE MONITOR AUTOMAT) DEVI Check blood pressure 3 times per week 1 Device 0  ? Calcium Carbonate Antacid (TUMS PO) Take 1 tablet by mouth daily as needed.    ? Dulaglutide (TRULICITY) 3.33 LK/5.6YB SOPN Inject 0.75 mg into the skin once a week. 2 mL 1  ? empagliflozin (JARDIANCE) 10 MG TABS tablet Take 1 tablet (10 mg total) by mouth daily. 90 tablet 3  ? fluconazole (DIFLUCAN) 150 MG tablet Take 150 mg by mouth once.    ? fluticasone (FLONASE) 50 MCG/ACT nasal spray Place 2 sprays into both nostrils daily. 16 g 11  ? lisinopril (ZESTRIL) 40 MG tablet Take 40 mg by mouth daily.    ? metFORMIN (GLUCOPHAGE XR) 500 MG 24 hr tablet Take 1 tablet (500 mg total)  by mouth daily with breakfast. 90 tablet 3  ? Polyethylene Glycol 3350 (PEG 3350) 17 GM/SCOOP POWD Take 17g by mouth daily prn 850 g 1  ? rosuvastatin (CRESTOR) 20 MG tablet Take 1 tablet (20 mg total) by mouth daily. 90 tablet 3  ? TRUEplus Lancets 33G MISC Use to check blood sugar once daily 100 each 2  ? vitamin E 200 UNIT capsule Take by mouth daily.    ? ?No current facility-administered medications for this visit.  ? ? ?Allergies:   Amaryl [glimepiride], Food, and Sulfamethoxazole  ? ? ?Social History:  The patient  reports that she has never smoked. She has never used smokeless tobacco. She reports that she does not drink  alcohol and does not use drugs.  ? ?Family History:  The patient's family history includes Colon cancer (age of onset: 23) in her mother; Diabetes in her brother; Heart attack in her maternal grandfather; Hypertension in her brother; Other in her father; Ovarian cancer in her mother; Prostate cancer in her brother; Thyroid disease in her maternal grandmother.  ? ? ?ROS:   ?Please see the history of present illness. ?(+) "Pinching" pain in central chest ?All other systems are reviewed and negative.  ? ? ? ?PHYSICAL EXAM: ?VS:  There were no vitals taken for this visit. , BMI There is no height or weight on file to calculate BMI. ?GENERAL:  Well appearing ?HEENT: Pupils equal round and reactive, fundi not visualized, oral mucosa unremarkable ?NECK:  No jugular venous distention, waveform within normal limits, carotid upstroke brisk and symmetric, no bruits ?LUNGS:  Clear to auscultation bilaterally ?HEART:  Bradycardic.  Regular rhythm.  PMI not displaced or sustained,S1 and S2 within normal limits, no S3, no S4, no clicks, no rubs, II/VI systolic murmur ?ABD:  Flat, positive bowel sounds normal in frequency in pitch, no bruits, no rebound, no guarding, no midline pulsatile mass, no hepatomegaly, no splenomegaly ?EXT:  2 plus pulses throughout, no edema, no cyanosis no clubbing ?SKIN:  No rashes no nodules ?NEURO:  Cranial nerves II through XII grossly intact, motor grossly intact throughout ?PSYCH:  Cognitively intact, oriented to person place and time ? ?EKG:   ?09/22/2020: Sinus bradycardia. Rate 47 bpm. ?03/22/2020: EKG was not ordered. ?01/28/2020: Sinus bradycardia. Rate 49 bpm. ?01/27/16: Sinus bradycardia. Rate 48 bpm. ? ?Lexiscan Myoview 11/25/19: ?There were transient T wave changes in the inferolateral leads during infusion that rapidly reolved in recovery ?Nuclear stress EF: 58%. ?The left ventricular ejection fraction is normal (55-65%). ?The study is normal. ?This is a low risk study. ? ?Echo  02/03/2018: ?Study Conclusions  ? ?- Left ventricle: The cavity size was normal. Wall thickness was  ?  normal. Systolic function was normal. The estimated ejection  ?  fraction was in the range of 60% to 65%. Wall motion was normal;  ?  there were no regional wall motion abnormalities. Features are  ?  consistent with a pseudonormal left ventricular filling pattern,  ?  with concomitant abnormal relaxation and increased filling  ?  pressure (grade 2 diastolic dysfunction).  ?- Aortic valve: Mildly calcified annulus. There was mild stenosis.  ?  Valve area (VTI): 1.52 cm^2. Valve area (Vmax): 1.54 cm^2. Valve  ?  area (Vmean): 1.65 cm^2.  ?- Mitral valve: There was mild regurgitation. ? ?3-day ZIO monitor 01/2020: ? ?Quality: Fair.  Baseline artifact. ?Predominant rhythm: Sinus bradycardia ?Average heart rate: 55 bpm ?Max heart rate: 139 bpm ?Min heart rate: 34 bpm ?Pauses >2.5  seconds: None ? ?Occasional PVCs and PACs.   ? ?Patient did submit a symptom diary.  She pushed the button twice at which time sinus rhythm was noted. ? ? ?Recent Labs: ?07/14/2021: BUN 23; Creatinine, Ser 1.13; Potassium 4.6; Sodium 138  ? ? ?Lipid Panel ?   ?Component Value Date/Time  ? CHOL 224 (H) 08/11/2020 1203  ? TRIG 95 08/11/2020 1203  ? HDL 82 08/11/2020 1203  ? CHOLHDL 2.7 08/11/2020 1203  ? CHOLHDL 1.8 12/02/2014 0952  ? VLDL 12 12/02/2014 0952  ? LDLCALC 126 (H) 08/11/2020 1203  ? LDLDIRECT 62 07/28/2012 0927  ? ?  ? ?Wt Readings from Last 3 Encounters:  ?07/27/21 112 lb (50.8 kg)  ?07/14/21 114 lb 2 oz (51.8 kg)  ?05/10/21 111 lb 12.8 oz (50.7 kg)  ?  ? ?ASSESSMENT AND PLAN: ?No problem-specific Assessment & Plan notes found for this encounter. ? ? ?Current medicines are reviewed at length with the patient today.  The patient does not have concerns regarding medicines. ? ?The following changes have been made:  no change ? ?Labs/ tests ordered today include:  ? ?No orders of the defined types were placed in this  encounter. ? ? ? ?Disposition:   FU with Tiffany C. Oval Linsey, MD, [facility] in 6 months. ? ? ?This note was written with the assistance of speech recognition software.  Please excuse any transcriptional errors. ? ?I,Mathew Stumpf,acting as a Education administrator for

## 2021-08-15 NOTE — Progress Notes (Incomplete)
?Cardiology Office Note:   ? ?Date:  08/15/2021  ? ?ID:  Mallory Hayes, DOB [birth year], MRN [id number] ? ?PCP:  [name], MD  ?Cardiologist:  Skeet Latch, MD ? ?Referring MD: [name], MD  ? ?No chief complaint on file. ? ? ?History of Present Illness: ?Mallory Hayes is a 76 y.o. female hypertension, hyperlipidemia, OSA on CPAP, and diabetes who presents for follow up.  She was seen 12/2015 for an evaluation of bradycardia. Mallory Hayes notes that her heart rate has been low for years.  She saw her PCP and was noted that her heart rate was in the 46. She was referred to cardiology for evaluation. At the time she was active and had no exertional symptoms. Echocardiogram revealed LVEF 55 to 60% with grade 1 diastolic dysfunction.  Mallory Hayes reported an episode of exertional chest pain while doing yardwork.  She had a [employer] 10/2019 that was negative for ischemia.  She had an echo 10/219 that revealed LVEF 60-65% and grade 2 diastolic dysfunction.  She notes that she stopped taking rosuvastatin because she didn't want to take so many pills.  She reported some palpitations.  She wore an ambulatory monitor 01/2020 that revealed occasional PACs and PVCs.  There are episodes when she pressed her monitor in sinus rhythm with underlying. She was switched to Tribenzor from lisinopril, amlodipine, and furosemide.  However she decided not to take it due to concern for potential side effects. She was willing to try HCTZ, and furosemide was discontinued.  ? ?At her last appointment she reported occasional pinpoint "pinching" chest pain after waking up in the mornings, improved once she is up and moving around. She also noted her PCP had decreased her Jardiance and Metformin due to abnormal kidney function. ?Today, she is having both good and bad days. She feels better when she goes out to exercise and will work in her yard. Last Friday her PCP lowered her Jardiance and Metformin due to her abnormal  kidney function. She seems to be tolerating the HCTZ. Occasionally she wakes up in the morning with a pinpoint "pinching" in her central chest, like a needle "sticking" in her chest. This occurs when she is still lying down, but dissipates after getting up and moving around for a time. She denies any shortness of breath, palpitations, or exertional symptoms. No headaches, lightheadedness, or syncope to report. Also has no lower extremity edema, orthopnea or PND. ? ?Today, *** ? ?Past Medical History:  ?Diagnosis Date  ? Diabetes mellitus without complication ([place])   ? GERD (gastroesophageal reflux disease)   ? Hypertension   ? OSA (obstructive sleep apnea)   ? Sleep apnea   ? ? ?Past Surgical History:  ?Procedure Laterality Date  ? APPENDECTOMY    ? BREAST EXCISIONAL BIOPSY Right   ? benign  ? CHOLECYSTECTOMY    ? COLONOSCOPY  2011  ? PARTIAL HYSTERECTOMY  1991  ? ? ? ?Current Outpatient Medications  ?Medication Sig Dispense Refill  ? amLODipine (NORVASC) 10 MG tablet TAKE 1 TABLET BY MOUTH EVERY DAY 90 tablet 3  ? aspirin 81 MG tablet Take 81 mg by mouth daily.    ? Blood Glucose Monitoring Suppl (TRUE METRIX AIR GLUCOSE METER) w/Device KIT USE AS DIRECTED 1 kit 0  ? Blood Pressure Monitoring (BLOOD PRESSURE MONITOR AUTOMAT) DEVI Check blood pressure 3 times per week 1 Device 0  ? Calcium Carbonate Antacid (TUMS PO) Take 1 tablet by mouth daily as needed.    ?  Dulaglutide (TRULICITY) 1.60 FU/9.3AT SOPN Inject 0.75 mg into the skin once a week. 2 mL 1  ? empagliflozin (JARDIANCE) 10 MG TABS tablet Take 1 tablet (10 mg total) by mouth daily. 90 tablet 3  ? fluconazole (DIFLUCAN) 150 MG tablet Take 150 mg by mouth once.    ? fluticasone (FLONASE) 50 MCG/ACT nasal spray Place 2 sprays into both nostrils daily. 16 g 11  ? lisinopril (ZESTRIL) 40 MG tablet Take 40 mg by mouth daily.    ? metFORMIN (GLUCOPHAGE XR) 500 MG 24 hr tablet Take 1 tablet (500 mg total) by mouth daily with breakfast. 90 tablet 3  ? Polyethylene  Glycol 3350 (PEG 3350) 17 GM/SCOOP POWD Take 17g by mouth daily prn 850 g 1  ? rosuvastatin (CRESTOR) 20 MG tablet Take 1 tablet (20 mg total) by mouth daily. 90 tablet 3  ? TRUEplus Lancets 33G MISC Use to check blood sugar once daily 100 each 2  ? vitamin E 200 UNIT capsule Take by mouth daily.    ? ?No current facility-administered medications for this visit.  ? ? ?Allergies:   Amaryl [glimepiride], Food, and Sulfamethoxazole  ? ? ?Social History:  The patient  reports that she has never smoked. She has never used smokeless tobacco. She reports that she does not drink alcohol and does not use drugs.  ? ?Family History:  The patient's family history includes Colon cancer (age of onset: 60) in her mother; Diabetes in her brother; Heart attack in her maternal grandfather; Hypertension in her brother; Other in her father; Ovarian cancer in her mother; Prostate cancer in her brother; Thyroid disease in her maternal grandmother.  ? ? ?ROS:   ?Please see the history of present illness. ?All other systems are reviewed and negative.  ? ?PHYSICAL EXAM: ?VS:  There were no vitals taken for this visit. , BMI There is no height or weight on file to calculate BMI. ?GENERAL:  Well appearing ?HEENT: Pupils equal round and reactive, fundi not visualized, oral mucosa unremarkable ?NECK:  No jugular venous distention, waveform within normal limits, carotid upstroke brisk and symmetric, no bruits ?LUNGS:  Clear to auscultation bilaterally ?HEART:  ***Bradycardic.  Regular rhythm.  PMI not displaced or sustained,S1 and S2 within normal limits, no S3, no S4, no clicks, no rubs, ***II/VI systolic murmur ?ABD:  Flat, positive bowel sounds normal in frequency in pitch, no bruits, no rebound, no guarding, no midline pulsatile mass, no hepatomegaly, no splenomegaly ?EXT:  2 plus pulses throughout, no edema, no cyanosis no clubbing ?SKIN:  No rashes no nodules ?NEURO:  Cranial nerves II through XII grossly intact, motor grossly intact  throughout ?PSYCH:  Cognitively intact, oriented to person place and time ? ?EKG:  EKG is personally reviewed. ?08/17/2021: Sinus ***. Rate *** bpm. ?09/22/2020: Sinus bradycardia. Rate 47 bpm. ?03/22/2020: EKG was not ordered. ?01/28/2020: Sinus bradycardia. Rate 49 bpm. ?01/27/16: Sinus bradycardia. Rate 48 bpm. ? ? ?3-day ZIO monitor 01/2020: ?Quality: Fair.  Baseline artifact. ?Predominant rhythm: Sinus bradycardia ?Average heart rate: 55 bpm ?Max heart rate: 139 bpm ?Min heart rate: 34 bpm ?Pauses >2.5 seconds: None ? ?Occasional PVCs and PACs.   ? ?Patient did submit a symptom diary.  She pushed the button twice at which time sinus rhythm was noted. ? ?Lexiscan Myoview 11/25/19: ?There were transient T wave changes in the inferolateral leads during infusion that rapidly reolved in recovery ?Nuclear stress EF: 58%. ?The left ventricular ejection fraction is normal (55-65%). ?The study is normal. ?This  is a low risk study. ? ?Echo 02/03/2018: ?Study Conclusions  ? ?- Left ventricle: The cavity size was normal. Wall thickness was  ?  normal. Systolic function was normal. The estimated ejection  ?  fraction was in the range of 60% to 65%. Wall motion was normal;  ?  there were no regional wall motion abnormalities. Features are  ?  consistent with a pseudonormal left ventricular filling pattern,  ?  with concomitant abnormal relaxation and increased filling  ?  pressure (grade 2 diastolic dysfunction).  ?- Aortic valve: Mildly calcified annulus. There was mild stenosis.  ?  Valve area (VTI): 1.52 cm^2. Valve area (Vmax): 1.54 cm^2. Valve  ?  area (Vmean): 1.65 cm^2.  ?- Mitral valve: There was mild regurgitation. ? ? ?Recent Labs: ?07/14/2021: BUN 23; Creatinine, Ser 1.13; Potassium 4.6; Sodium 138  ? ? ?Lipid Panel ?   ?Component Value Date/Time  ? CHOL 224 (H) 08/11/2020 1203  ? TRIG 95 08/11/2020 1203  ? HDL 82 08/11/2020 1203  ? CHOLHDL 2.7 08/11/2020 1203  ? CHOLHDL 1.8 12/02/2014 0952  ? VLDL 12 12/02/2014 0952  ?  LDLCALC 126 (H) 08/11/2020 1203  ? LDLDIRECT 62 07/28/2012 0927  ? ?  ? ?Wt Readings from Last 3 Encounters:  ?07/27/21 112 lb (50.8 kg)  ?07/14/21 114 lb 2 oz (51.8 kg)  ?05/10/21 111 lb 12.8 oz (50.7 kg)

## 2021-08-17 ENCOUNTER — Ambulatory Visit (HOSPITAL_BASED_OUTPATIENT_CLINIC_OR_DEPARTMENT_OTHER): Payer: Medicare HMO | Admitting: Cardiovascular Disease

## 2021-08-22 ENCOUNTER — Encounter (HOSPITAL_BASED_OUTPATIENT_CLINIC_OR_DEPARTMENT_OTHER): Payer: Self-pay | Admitting: Family

## 2021-08-22 ENCOUNTER — Telehealth (HOSPITAL_BASED_OUTPATIENT_CLINIC_OR_DEPARTMENT_OTHER): Payer: Self-pay | Admitting: Family

## 2021-08-22 ENCOUNTER — Ambulatory Visit (INDEPENDENT_AMBULATORY_CARE_PROVIDER_SITE_OTHER): Payer: Medicare HMO | Admitting: Family

## 2021-08-22 VITALS — BP 142/74 | HR 51 | Ht 61.0 in | Wt 111.5 lb

## 2021-08-22 DIAGNOSIS — E1169 Type 2 diabetes mellitus with other specified complication: Secondary | ICD-10-CM | POA: Diagnosis not present

## 2021-08-22 DIAGNOSIS — I350 Nonrheumatic aortic (valve) stenosis: Secondary | ICD-10-CM | POA: Diagnosis not present

## 2021-08-22 DIAGNOSIS — E782 Mixed hyperlipidemia: Secondary | ICD-10-CM

## 2021-08-22 DIAGNOSIS — I10 Essential (primary) hypertension: Secondary | ICD-10-CM

## 2021-08-22 DIAGNOSIS — R001 Bradycardia, unspecified: Secondary | ICD-10-CM

## 2021-08-22 MED ORDER — CHLORTHALIDONE 25 MG PO TABS: 12.5000 mg | ORAL_TABLET | Freq: Every day | ORAL | 3 refills | Status: DC

## 2021-08-22 NOTE — Patient Instructions (Signed)
Medication Instructions:  ?Your physician has recommended you make the following change in your medication:  ? ?Start: Chlorthalidone 12.'5mg'$  (half tablet) daily  ? ?*If you need a refill on your cardiac medications before your next appointment, please call your pharmacy* ? ? ?Lab Work: ?Please return for Lab work in 1 week for [employer] . You may come to the...  ? ?[facility] (3rd floor) ?[address] [place], [place]  ?Open: 8am-Noon and 1pm-4:30pm  ? ?[place] [facility] ?[place]  ? ?[employer]- Any location ? ?**no appointments needed** ? ?If you have labs (blood work) drawn today and your tests are completely normal, you will receive your results only by: ?MyChart Message (if you have MyChart) OR ?A paper copy in the mail ?If you have any lab test that is abnormal or we need to change your treatment, we will call you to review the results. ? ? ?Testing/Procedures: ?You are clear for your colonoscopy and if they ask you to hold your Aspirin before hand that is okay.  ? ?Your physician has requested that you have an echocardiogram. Echocardiography is a painless test that uses sound waves to create images of your heart. It provides your doctor with information about the size and shape of your heart and how well your heart?s chambers and valves are working. This procedure takes approximately one hour. There are no restrictions for this procedure. ?[place] ? ? ?Follow-Up: ?At [facility], you and your health needs are our priority.  As part of our continuing mission to provide you with exceptional heart care, we have created designated Provider Care Teams.  These Care Teams include your primary Cardiologist (physician) and Advanced Practice Providers (APPs -  Physician Assistants and Nurse Practitioners) who all work together to provide you with the care you need, when you need it. ? ?We recommend signing up for the patient  portal called "MyChart".  Sign up information is provided on this After Visit Summary.  MyChart is used to connect with patients for Virtual Visits (Telemedicine).  Patients are able to view lab/test results, encounter notes, upcoming appointments, etc.  Non-urgent messages can be sent to your provider as well.   ?To learn more about what you can do with MyChart, go to [email or web address].   ? ?Your next appointment:   ?3-4 month(s) ? ?The format for your next appointment:   ?In Person ? ?Provider:   ?Skeet Latch, MD or [name], NP{ ? ?Other Instructions ?Heart Healthy Diet Recommendations: ?A low-salt diet is recommended. Meats should be grilled, baked, or boiled. Avoid fried foods. Focus on lean protein sources like fish or chicken with vegetables and fruits. The American Heart Association is a Microbiologist!  American Heart Association Diet and Lifeystyle Recommendations  ? ?Exercise recommendations: ?The American Heart Association recommends 150 minutes of moderate intensity exercise weekly. ?Try 30 minutes of moderate intensity exercise 4-5 times per week. ?This could include walking, jogging, or swimming. ? ?Tips to Measure your Blood Pressure Correctly ? ?To determine whether you have hypertension, a medical professional will take a blood pressure reading. How you prepare for the test, the position of your arm, and other factors can change a blood pressure reading by 10% or more. That could be enough to hide high blood pressure, start you on a drug you don't really need, or lead your doctor to incorrectly adjust your medications. ? ?National and international guidelines offer specific instructions for measuring  blood pressure. If a doctor, nurse, or medical assistant isn't doing it right, don't hesitate to ask him or her to get with the guidelines. ? ?Here's what you can do to ensure a correct reading: ? Don't drink a caffeinated beverage or smoke during the 30 minutes before the test. ? Sit  quietly for five minutes before the test begins. ? During the measurement, sit in a chair with your feet on the floor and your arm supported so your elbow is at about heart level. ? The inflatable part of the cuff should completely cover at least 80% of your upper arm, and the cuff should be placed on bare skin, not over a shirt. ? Don't talk during the measurement. ? Have your blood pressure measured twice, with a brief break in between. If the readings are different by 5 points or more, have it done a third time. ? ?In 2017, new guidelines from the [place], the [employer] of Cardiology, and nine other health organizations lowered the diagnosis of high blood pressure to 130/80 mm Hg or higher for all adults. The guidelines also redefined the various blood pressure categories to now include normal, elevated, Stage 1 hypertension, Stage 2 hypertension, and hypertensive crisis (see "Blood pressure categories"). ? ?Blood pressure categories  ?Blood pressure category SYSTOLIC ?(upper number)  DIASTOLIC ?(lower number)  ?Normal Less than 120 mm Hg and Less than 80 mm Hg  ?Elevated 120-129 mm Hg and Less than 80 mm Hg  ?High blood pressure: Stage 1 hypertension 130-139 mm Hg or 80-89 mm Hg  ?High blood pressure: Stage 2 hypertension 140 mm Hg or higher or 90 mm Hg or higher  ?Hypertensive crisis (consult your doctor immediately) Higher than 180 mm Hg and/or Higher than 120 mm Hg  ?Source: American Heart Association and American Stroke Association. ?For more on getting your blood pressure under control, buy Controlling Your Blood Pressure, a Special Health Report from [facility] ? ? ?Blood Pressure Log ? ? ?Date ?  ?Time  ?Blood Pressure  ?Position  ?Example: Nov 1 9 AM 124/78 sitting  ? ?     ? ?     ? ?     ? ?     ? ?     ? ?     ? ?     ? ?     ? ? ? ? ? ? ?

## 2021-08-22 NOTE — Telephone Encounter (Signed)
Called patient to schedule Echocardiogram and left v/m. ?

## 2021-08-22 NOTE — Progress Notes (Signed)
? ?Office Visit  ?  ?Patient Name: Mallory Hayes ?Date of Encounter: 08/22/2021 ? ?PCP:  [name], MD ?  ?[place]  ?Cardiologist:  Skeet Latch, MD  ?Advanced Practice Provider:  No care team member to display ?Electrophysiologist:  None  ?   ? ?Chief Complaint  ?  ?Mallory Hayes is a 76 y.o. female with a hx of hypertension, hyperlipidemia, OSA on CPAP, DM2, mild aortic stenosis, bradycardia presents today for follow-up of hypertension, aortic stenosis. ? ?Past Medical History  ?  ?Past Medical History:  ?Diagnosis Date  ? Diabetes mellitus without complication ([place])   ? GERD (gastroesophageal reflux disease)   ? Hypertension   ? OSA (obstructive sleep apnea)   ? Sleep apnea   ? ?Past Surgical History:  ?Procedure Laterality Date  ? APPENDECTOMY    ? BREAST EXCISIONAL BIOPSY Right   ? benign  ? CHOLECYSTECTOMY    ? COLONOSCOPY  2011  ? PARTIAL HYSTERECTOMY  1991  ? ? ?Allergies ? ?Allergies  ?Allergen Reactions  ? Amaryl [Glimepiride] Anxiety  ? Food Itching  ?  Spices, peppers  ? Sulfamethoxazole Itching, Rash and Other (See Comments)  ?  sneezing ?sneezing  ? ? ?History of Present Illness  ?  ?Mallory Hayes is a 76 y.o. female with a hx of hypertension, hyperlipidemia, OSA on CPAP, DM2, mild aortic stenosis, bradycardia last seen 09/22/2020 by Dr. Oval Linsey. ? ?She was initially referred September 2017 with bradycardia which have been present for many years.  She was active without exertional symptoms.  Echo with EF 55 to 84%, grade 1 diastolic dysfunction.  Lexiscan Myoview July 2021 due to chest pain while doing yard work negative for ischemia.  Echo 01/2018 with LVEF 60 to 69%, grade 2 diastolic dysfunction, mild aortic stenosis.  Ambulatory monitor due to palpitations 01/2020 with occasional PAC and PVC.  She was switched to Tribenzor from lisinopril, amlodipine, furosemide.  However was concerned about side effects and was transition back to amlodipine, lisinopril,  furosemide.  Her furosemide was transitioned to hydrochlorothiazide.  When last seen 08/2020 she was doing overall well from a cardiac perspective and no changes were made. ? ?She presents today for follow-up.  Tells me she has upcoming colonoscopy May 18 which is a routine screening. Se recently started Trulicity at the direction of her primary care provider as A1c 10.9.  She has been feeling overall well since last seen.  She does try to walk 15 to 20 minutes each morning and feels much better when she exercises.  She additionally does her own yard work.  She was me that her nephrologist took her off hydrochlorothiazide in October and increased her lisinopril but her blood pressure has remained elevated.  Her GFR one-\point had decreased to 38 but most recent labs with GFR 51.  ? ?EKGs/Labs/Other Studies Reviewed:  ? ?The following studies were reviewed today: ? ?Lexiscan Myoview 11/25/19: ?There were transient T wave changes in the inferolateral leads during infusion that rapidly reolved in recovery ?Nuclear stress EF: 58%. ?The left ventricular ejection fraction is normal (55-65%). ?The study is normal. ?This is a low risk study. ?  ?Echo 02/03/2018: ?Study Conclusions  ? ?- Left ventricle: The cavity size was normal. Wall thickness was  ?  normal. Systolic function was normal. The estimated ejection  ?  fraction was in the range of 60% to 65%. Wall motion was normal;  ?  there were no regional wall motion abnormalities. Features are  ?  consistent with a pseudonormal left ventricular filling pattern,  ?  with concomitant abnormal relaxation and increased filling  ?  pressure (grade 2 diastolic dysfunction).  ?- Aortic valve: Mildly calcified annulus. There was mild stenosis.  ?  Valve area (VTI): 1.52 cm^2. Valve area (Vmax): 1.54 cm^2. Valve  ?  area (Vmean): 1.65 cm^2.  ?- Mitral valve: There was mild regurgitation. ?  ?3-day ZIO monitor 01/2020: ?  ?Quality: Fair.  Baseline artifact. ?Predominant rhythm: Sinus  bradycardia ?Average heart rate: 55 bpm ?Max heart rate: 139 bpm ?Min heart rate: 34 bpm ?Pauses >2.5 seconds: None ?  ?Occasional PVCs and PACs.   ?  ?Patient did submit a symptom diary.  She pushed the button twice at which time sinus rhythm was noted. ?  ? ?EKG:  EKG is ordered today.  The ekg ordered today demonstrates sinus bradycardia 46 bpm with no acute ST/T wave changes.  ? ?Recent Labs: ?07/14/2021: BUN 23; Creatinine, Ser 1.13; Potassium 4.6; Sodium 138  ?Recent Lipid Panel ?   ?Component Value Date/Time  ? CHOL 224 (H) 08/11/2020 1203  ? TRIG 95 08/11/2020 1203  ? HDL 82 08/11/2020 1203  ? CHOLHDL 2.7 08/11/2020 1203  ? CHOLHDL 1.8 12/02/2014 0952  ? VLDL 12 12/02/2014 0952  ? LDLCALC 126 (H) 08/11/2020 1203  ? LDLDIRECT 62 07/28/2012 0927  ? ? ? ?Home Medications  ? ?Current Meds  ?Medication Sig  ? amLODipine (NORVASC) 10 MG tablet TAKE 1 TABLET BY MOUTH EVERY DAY  ? aspirin 81 MG tablet Take 81 mg by mouth daily.  ? Blood Glucose Monitoring Suppl (TRUE METRIX AIR GLUCOSE METER) w/Device KIT USE AS DIRECTED  ? Blood Pressure Monitoring (BLOOD PRESSURE MONITOR AUTOMAT) DEVI Check blood pressure 3 times per week  ? Calcium Carbonate Antacid (TUMS PO) Take 1 tablet by mouth daily as needed.  ? chlorthalidone (HYGROTON) 25 MG tablet Take 0.5 tablets (12.5 mg total) by mouth daily.  ? Dulaglutide (TRULICITY) 1.61 WR/6.0AV SOPN Inject 0.75 mg into the skin once a week.  ? empagliflozin (JARDIANCE) 10 MG TABS tablet Take 1 tablet (10 mg total) by mouth daily.  ? fluconazole (DIFLUCAN) 150 MG tablet Take 150 mg by mouth once.  ? fluticasone (FLONASE) 50 MCG/ACT nasal spray Place 2 sprays into both nostrils daily.  ? lisinopril (ZESTRIL) 40 MG tablet Take 40 mg by mouth daily.  ? metFORMIN (GLUCOPHAGE XR) 500 MG 24 hr tablet Take 1 tablet (500 mg total) by mouth daily with breakfast.  ? Polyethylene Glycol 3350 (PEG 3350) 17 GM/SCOOP POWD Take 17g by mouth daily prn  ? rosuvastatin (CRESTOR) 20 MG tablet Take 1  tablet (20 mg total) by mouth daily.  ? TRUEplus Lancets 33G MISC Use to check blood sugar once daily  ? vitamin E 200 UNIT capsule Take by mouth daily.  ?  ? ?Review of Systems  ?    ?All other systems reviewed and are otherwise negative except as noted above. ? ?Physical Exam  ?  ?VS:  BP (!) 142/74   Pulse (!) 51   Ht _0  (1.549 m)   Wt 111 lb 8 oz (50.6 kg)   SpO2 98%   BMI 21.07 kg/m?  , BMI Body mass index is 21.07 kg/m?. ? ?Wt Readings from Last 3 Encounters:  ?08/22/21 111 lb 8 oz (50.6 kg)  ?07/27/21 112 lb (50.8 kg)  ?07/14/21 114 lb 2 oz (51.8 kg)  ?  ? ?GEN: Well nourished, well developed, in no  acute distress. ?HEENT: normal. ?Neck: Supple, no JVD, carotid bruits, or masses. ?Cardiac: RRR, no  rubs, or gallops. Gr II/VI systolic murmur. No clubbing, cyanosis, edema.  Radials/PT 2+ and equal bilaterally.  ?Respiratory:  Respirations regular and unlabored, clear to auscultation bilaterally. ?GI: Soft, nontender, nondistended. ?MS: No deformity or atrophy. ?Skin: Warm and dry, no rash. ?Neuro:  Strength and sensation are intact. ?Psych: Normal affect. ? ?Assessment & Plan  ?  ? Preop clearance-upcoming colonoscopy. According to the Revised Cardiac Risk Index (RCRI), her Perioperative Risk of Major Cardiac Event is (%): 0.4. Her  Functional Capacity in METs is: 6.61 according to the [place] Activity Status Index (DASI).  She may proceed with colonoscopy without additional cardiovascular testing.  No history of coronary disease, may hold aspirin at the preference of her gastroenterologist. ? ?Mild aortic stenosis -noted to be mild by echo 2019.  No chest pain, dyspnea, syncope.  Update echocardiogram for monitoring.  Continue optimal blood pressure control.  Updated echo does not need to be completed prior to colonoscopy. ? ?Hypertension -BP not at goal.  Was previously well controlled on lisinopril, amlodipine, hydrochlorothiazide.  She was me that her nephrologist took her off of hydrochlorothiazide as  her GFR decreased to 38 but has subsequently improved to 51.  Discussed with Dr. Oval Linsey in clinic.  We will start chlorthalidone 12.5 mg daily.  Repeat BMP in 1 week.  Continue lisinopril 40 mg daily, amlod

## 2021-08-23 ENCOUNTER — Telehealth: Payer: Self-pay | Admitting: Pharmacy Technician

## 2021-08-23 DIAGNOSIS — Z596 Low income: Secondary | ICD-10-CM

## 2021-08-23 NOTE — Progress Notes (Signed)
[facility])  ?                                          [facility] Quality Pharmacy Team ?  ? ?08/23/2021 ? ?[name] ?02/01/1946 ?[id number] ? ?Incoming call received from patient, HIPAA verified. ? ?Patient informs she was recently started on Trulicity and was inquiring about patient assistance programs as the copay for the medication is cost prohibitive. ? ?Informed patient that [facility] is currently not accepting new applications for Trulicity due to limited availability of this medication. ? ?Sent referral to [place] for assistance with this patient to see if patient may qualify for other patient assistance programs provided patient and provider agree to a therapeutic interchange. ? ?Chrishana Spargur P. Danyiel Crespin, CPhT ?[place]  ?[phone] ? ?

## 2021-08-24 ENCOUNTER — Encounter: Payer: Self-pay | Admitting: Pharmacist

## 2021-08-24 ENCOUNTER — Ambulatory Visit (INDEPENDENT_AMBULATORY_CARE_PROVIDER_SITE_OTHER): Payer: Medicare HMO | Admitting: Pharmacist

## 2021-08-24 DIAGNOSIS — E1169 Type 2 diabetes mellitus with other specified complication: Secondary | ICD-10-CM | POA: Diagnosis not present

## 2021-08-24 MED ORDER — FREESTYLE LIBRE 2 SENSOR MISC: 1.0000 | 11 refills | Status: DC

## 2021-08-24 MED ORDER — TRULICITY 1.5 MG/0.5ML ~~LOC~~ SOAJ: 1.5000 mg | SUBCUTANEOUS | 2 refills | Status: DC

## 2021-08-24 MED ORDER — FREESTYLE LIBRE 2 READER DEVI: 1.0000 | Freq: Once | 0 refills | Status: DC

## 2021-08-24 NOTE — Patient Instructions (Addendum)
Nice to see you today! ? ?Today we increased your Trulicity dose to 1.5 mg weekly. You can use your last 0.75 mg pen this coming Tuesday and then begin the 1.5 mg dose. Continue all other medications. Please try checking your blood sugar 3 times per week until we are able to see if the [facility] is able to be covered.  ? ?We will follow up with you at the end of May.  ?

## 2021-08-24 NOTE — Progress Notes (Signed)
? ? ?S:    ? ?Chief Complaint  ?Patient presents with  ? Medication Management  ?  DM f/u  ? ?Mallory Hayes is a 76 y.o. female who presents for diabetes evaluation, education, and management. PMH is significant for T2DM, HTN. Patient was referred and last seen by Dr. Chauncey Reading, on 07/14/21. Patient was last seen by pharmacy team on 07/27/21. At last visit, Trulicity (dulaglutide) 0.75 mg weekly was started.  ? ?Today, patient arrives in good spirits and presents without assistance.  ? ?Current diabetes medications include: Jardiance (empagliflozin) 10 mg daily, Trulicity (dulaglutide) 0.75 mg weekly, metformin 500 mg daily  ?Current hypertension medications include: amlodipine 10 mg daily, lisinopril 40 mg daily, chlorthalidone 12.5 mg daily (has not started this medication yet) ?Current hyperlipidemia medications include: rosuvastatin 20 mg  ? ?Patient reports taking all medications as prescribed. Patient reports adherence with medications. Patient reports missing her medications ~2x times per week, on average. ? ?Patient denies hypoglycemic events. ? ?Patient reports she does not have much energy and that it comes in spurts. Patient reports she is interested in vitamins to help with her energy.  ? ?Patient reports she has not been checking her blood sugars at home.  ? ?Since beginning the Trulicity (dulaglutide), patient reports appetite has been about the same.  ? ?Patient reports nocturia (nighttime urination) ~2x per night. She reports this is less than before she started the Trulicity (dulaglutide).  ?Patient denies neuropathy (nerve pain). ?Patient denies visual changes. ?Patient reports self foot exams.  ? ?Patient reported dietary habits: Eats 2 meals/day ?Breakfast: oatmeal, 1% milk, egg ?Lunch: chicken with shredded lettuce ?Dinner: frozen pot pie with broccoli ?Snacks: Ritz crackers with peanut butter ?Drinks: water, Ginger ale  ? ?Patient-reported exercise habits: body stretches before getting up in the  morning; walking 15-20 minutes per day ? ?Patient reports she wants to try the Freestyle Libre CGM. Patient has a history of symptomatic hypoglycemia events int the past.   ? ?O:  ?Physical Exam ?Constitutional:   ?   Appearance: Normal appearance. She is normal weight.  ?Pulmonary:  ?   Effort: Pulmonary effort is normal.  ?Neurological:  ?   Mental Status: She is alert.  ?Psychiatric:     ?   Mood and Affect: Mood normal.     ?   Behavior: Behavior normal.     ?   Thought Content: Thought content normal.     ?   Judgment: Judgment normal.  ? ? ?Review of Systems  ?All other systems reviewed and are negative. ? ? ?Lab Results  ?Component Value Date  ? HGBA1C 10.9 (A) 07/14/2021  ? ?Vitals:  ? 08/24/21 1133  ?BP: (!) 157/51  ?Pulse: (!) 51  ?SpO2: 100%  ? ? ?Lipid Panel  ?   ?Component Value Date/Time  ? CHOL 224 (H) 08/11/2020 1203  ? TRIG 95 08/11/2020 1203  ? HDL 82 08/11/2020 1203  ? CHOLHDL 2.7 08/11/2020 1203  ? CHOLHDL 1.8 12/02/2014 0952  ? VLDL 12 12/02/2014 0952  ? LDLCALC 126 (H) 08/11/2020 1203  ? LDLDIRECT 62 07/28/2012 0927  ? ? ?Clinical Atherosclerotic Cardiovascular Disease (ASCVD): No  ?The 10-year ASCVD risk score (Arnett DK, et al., 2019) is: 48.8% ?  Values used to calculate the score: ?    Age: 55 years ?    Sex: Female ?    Is Non-Hispanic African American: Yes ?    Diabetic: Yes ?    Tobacco smoker: No ?  Systolic Blood Pressure: 626 mmHg ?    Is BP treated: Yes ?    HDL Cholesterol: 82 mg/dL ?    Total Cholesterol: 224 mg/dL  ? ? ?A/P: ?Diabetes longstanding currently with unknown control due to not checking blood sugars at home but appears may be improving due to decrease in nocturia. Patient is able to verbalize appropriate hypoglycemia management plan. Medication adherence appears optimal. Control is suboptimal due to need for medication titration, dietary indiscretion. ?-Increased dose of GLP-1 Trulicity (dulaglutide) to 1.5 mg weekly.  ?-Continued SGLT2-I Jardiance (empagliflozin) at  10 mg weekly. Counseled on sick day rules. ?-Continued metformin 500 mg daily. ?-Patient educated on purpose, proper use, and potential adverse effects of Trulicity (dulaglutide).  ?- Ordered prescription for [employer] 2 reader and sensor for patient to see if her insurance plan will cover it. For authorization purposes, patient has history of problematic hypoglycemia in the past.  ?-Extensively discussed pathophysiology of diabetes, recommended lifestyle interventions, dietary effects on blood sugar control.  ?-Counseled on s/sx of and management of hypoglycemia.  ?-Next A1c anticipated 09/2021.  ? ?Hypertension longstanding currently uncontrolled. Blood pressure goal of <130/80 mmHg. Medication adherence optimal. Blood pressure control is suboptimal due to need for additional medication therapy. ?-Continued amlodipine 10 mg and lisinopril 40 mg daily. Patient has not started taking her chlorthalidone 12.5 mg daily and but will begin taking. ? ?Written patient instructions provided. Patient verbalized understanding of treatment plan. Total time in face to face counseling 34 minutes.   ? ?Follow up pharmacist May 25. Patient seen with Earvin Hansen, PharmD Candidate, and Rebbeca Paul, PharmD, PGY2 Pharmacy Resident.  ?

## 2021-08-24 NOTE — Assessment & Plan Note (Signed)
Diabetes longstanding currently with unknown control due to not checking blood sugars at home but appears may be improving due to decrease in nocturia. Patient is able to verbalize appropriate hypoglycemia management plan. Medication adherence appears optimal. Control is suboptimal due to need for medication titration, dietary indiscretion. ?-Increased dose of GLP-1 Trulicity (dulaglutide) to 1.5 mg weekly.  ?-Continued SGLT2-I Jardiance (empagliflozin) at 10 mg weekly. Counseled on sick day rules. ?-Continued metformin 500 mg daily. ?-Patient educated on purpose, proper use, and potential adverse effects of Trulicity (dulaglutide).  ?- Ordered prescription for [employer] 2 reader and sensor for patient to see if her insurance plan will cover it. For authorization purposes, patient has history of problematic hypoglycemia in the past.  ?

## 2021-08-24 NOTE — Progress Notes (Signed)
Reviewed: I agree with Dr. Koval's documentation and management. 

## 2021-08-25 ENCOUNTER — Ambulatory Visit (AMBULATORY_SURGERY_CENTER): Payer: Medicare HMO | Admitting: *Deleted

## 2021-08-25 VITALS — Ht 61.0 in | Wt 111.4 lb

## 2021-08-25 DIAGNOSIS — Z86010 Personal history of colonic polyps: Secondary | ICD-10-CM

## 2021-08-25 MED ORDER — PLENVU 140 G PO SOLR: 1.0000 | ORAL | 0 refills | Status: DC

## 2021-08-25 NOTE — Progress Notes (Signed)
No egg or soy allergy known to patient  ?No issues known to pt with past sedation with any surgeries or procedures ?Patient denies ever being told they had issues or difficulty with intubation  ?No FH of Malignant Hyperthermia ?Pt is not on diet pills ?Pt is not on  home 02  ?Pt is not on blood thinners  ? ?Pt denies issues with constipation - Miralax PRN-  pt has weekly constipation issues - Miralax does relieve constipation in a few days  ? ?No A fib or A flutter ? ?Pt has [employer] - covers Gol[employer]ely - pt states she cannot drink that volume - will do a Plenvu sample- pt to pick up prep and instructions on 2nd floor  ? ?PV completed over the phone. Pt verified name, DOB, address and insurance during PV today.  ?Pt mailed instruction packet with copy of consent form to read and not return, and instructions.  ?Pt encouraged to call with questions or issues.  ?If pt has My chart, procedure instructions sent via My Chart  ?Insurance confirmed with pt at [facility] today  ? ?

## 2021-09-05 ENCOUNTER — Other Ambulatory Visit: Payer: Self-pay

## 2021-09-05 ENCOUNTER — Ambulatory Visit (INDEPENDENT_AMBULATORY_CARE_PROVIDER_SITE_OTHER): Payer: Medicare HMO | Admitting: Podiatry

## 2021-09-05 ENCOUNTER — Encounter: Payer: Self-pay | Admitting: Podiatry

## 2021-09-05 ENCOUNTER — Other Ambulatory Visit: Payer: Self-pay | Admitting: Family Medicine

## 2021-09-05 ENCOUNTER — Encounter: Payer: Self-pay | Admitting: Family Medicine

## 2021-09-05 ENCOUNTER — Ambulatory Visit (INDEPENDENT_AMBULATORY_CARE_PROVIDER_SITE_OTHER): Payer: Medicare HMO | Admitting: Family Medicine

## 2021-09-05 DIAGNOSIS — W57XXXA Bitten or stung by nonvenomous insect and other nonvenomous arthropods, initial encounter: Secondary | ICD-10-CM | POA: Diagnosis not present

## 2021-09-05 DIAGNOSIS — M79675 Pain in left toe(s): Secondary | ICD-10-CM | POA: Diagnosis not present

## 2021-09-05 DIAGNOSIS — B351 Tinea unguium: Secondary | ICD-10-CM | POA: Diagnosis not present

## 2021-09-05 DIAGNOSIS — E1169 Type 2 diabetes mellitus with other specified complication: Secondary | ICD-10-CM | POA: Diagnosis not present

## 2021-09-05 DIAGNOSIS — M79674 Pain in right toe(s): Secondary | ICD-10-CM | POA: Diagnosis not present

## 2021-09-05 DIAGNOSIS — N183 Chronic kidney disease, stage 3 unspecified: Secondary | ICD-10-CM

## 2021-09-05 NOTE — Patient Instructions (Signed)
Patient Goals/Self-Care Activities: diabetes ?Take all medications as prescribed ?Attend all scheduled provider appointments ?check blood sugar at prescribed times: once daily ?check feet daily for cuts, sores or redness ?limit fast food meals to no more than 1 per week ?manage portion size ?Limit carbohydrate and sweets intake ?

## 2021-09-05 NOTE — Assessment & Plan Note (Signed)
Feels trulicity is making her tired.  Discussed trying to stick with it for another month to see if starts to feel better.  Follow up with PCP for A1c  ?

## 2021-09-05 NOTE — Progress Notes (Addendum)
? ? ?  SUBJECTIVE:  ? ?CHIEF COMPLAINT / HPI:  ? ?Tick Bite ?On R shoulder.  Unsure how long was there.  Took off 48 hours ago.  Itchy without pain or fever or rash.  Redness is not spreading ? ?Diabetes ?Feels the trulicity is making her tired and affecting her energy.  Last a1c = 10  ? ?PERTINENT  PMH / PSH: diabetes  ? ?OBJECTIVE:  ? ?BP 125/69   Pulse (!) 59   Temp 99 ?F (37.2 ?C) (Oral)   Wt 105 lb 3.2 oz (47.7 kg)   SpO2 98%   BMI 19.88 kg/m?   ? ?R shoulder small area of redness without fluctuance.   ?Skin:  Intact without suspicious lesions or rashes ?Mouth - no lesions, mucous membranes are moist, no decaying teeth   ? ? ? ? ? ?ASSESSMENT/PLAN:  ? ?Tick bite ?Seems to be just local reaction without signs of RMSF or lyme or bacterial infection.  See after visit summary  ? ?Type 2 diabetes mellitus ([place]) ?Feels trulicity is making her tired.  Discussed trying to stick with it for another month to see if starts to feel better.  Follow up with PCP for A1c  ?  ?Patient Instructions  ?Good to see you today - Thank you for coming in ? ?Things we discussed today: ? ?Tick Bite ? ?If the bite is getting larger, or more painful or you have a fever or a rash then let us know immediately  ? ?Please always bring your medication bottles ? ?Come back to see Dr Kathleen Lime in mid June ? ? ?Lind Covert, MD ?[place]  ?

## 2021-09-05 NOTE — Patient Instructions (Addendum)
Good to see you today - Thank you for coming in ? ?Things we discussed today: ? ?Tick Bite ? ?If the bite is getting larger, or more painful or you have a fever or a rash then let us know immediately  ? ?Please always bring your medication bottles ? ?Come back to see Dr Kathleen Lime in mid June ?

## 2021-09-05 NOTE — Progress Notes (Signed)
This patient returns to my office for at risk foot care.  This patient requires this care by a professional since this patient will be at risk due to having CKD stage 3 and type 2 diabetes.   This patient is unable to cut nails herself since the patient cannot reach her nails.These nails are painful walking and wearing shoes.  This patient presents for at risk foot care today.  General Appearance  Alert, conversant and in no acute stress.  Vascular  Dorsalis pedis and posterior tibial  pulses are palpable  bilaterally.  Capillary return is within normal limits  bilaterally. Temperature is within normal limits  bilaterally.  Neurologic  Senn-Weinstein monofilament wire test within normal limits  bilaterally. Muscle power within normal limits bilaterally.  Nails Thick disfigured discolored nails with subungual debris  from hallux to fifth toes bilaterally. No evidence of bacterial infection or drainage bilaterally.  Orthopedic  No limitations of motion  feet .  No crepitus or effusions noted.  No bony pathology or digital deformities noted.  HAV  B/L and tailors bunion  B/L.  Skin  normotropic skin with no porokeratosis noted bilaterally.  No signs of infections or ulcers noted.     Onychomycosis  Pain in right toes  Pain in left toes  Consent was obtained for treatment procedures.   Mechanical debridement of nails 1-5  bilaterally performed with a nail nipper.  Filed with dremel without incident.    Return office visit     3 months                 Told patient to return for periodic foot care and evaluation due to potential at risk complications.   Skye Plamondon DPM  

## 2021-09-05 NOTE — Assessment & Plan Note (Signed)
Seems to be just local reaction without signs of RMSF or lyme or bacterial infection.  See after visit summary  ?

## 2021-09-05 NOTE — Patient Outreach (Signed)
[facility] [place] [facility]) Care Management ? ?09/05/2021 ? ?Mallory Hayes ?Aug 07, 1945 ?[id number] ? ? ?Telephone call to patient for disease management follow up. Patient A1c up to 10.1 with recent start of trulicity.  Discussed medication and importance of diabetes management.  She states she sees the physician today for appointment. No concerns.   ? ?Care Plan : RN CM Care Plan  ?Updates made by Jon Billings, RN since 09/05/2021 12:00 AM  ?  ? ?Problem: Chronic Disease Management and Care Coordination Needs Diabetes   ?Priority: High  ?  ? ?Long-Range Goal: Development of Plan of Care for Management of diabetes   ?Start Date: 09/05/2021  ?Expected End Date: 04/29/2022  ?Priority: High  ?Note:   ?Current Barriers:  ?Chronic Disease Management support and education needs related to DMII  ? ?RNCM Clinical Goal(s):  ?Patient will verbalize basic understanding of  DMII disease process and self health management plan as evidenced by A1c less than 9.0 ?continue to work with RN Care Manager to address care management and care coordination needs related to  DMII as evidenced by adherence to CM Team Scheduled appointments through collaboration with RN Care manager, provider, and care team. DMII ? ?Interventions: ?Education and support related to diabetes ?Inter-disciplinary care team collaboration (see longitudinal plan of care) ?Evaluation of current treatment plan related to  self management and patient's adherence to plan as established by provider ? ? ?Diabetes Interventions:  (Status:  Goal on track:  Yes.) Long Term Goal ?Assessed patient's understanding of A1c goal: <7% ?Provided education to patient about basic DM disease process ?Reviewed medications with patient and discussed importance of medication adherence ?Discussed plans with patient for ongoing care management follow up and provided patient with direct contact information for care management team ?Lab Results  ?Component Value Date  ? HGBA1C 10.9 (A) 07/14/2021   ?09/05/21 Patient reports feeling bad today.  Recent start of trulicity due to I7T 24.5.  Discussed adjustment to medication and lower sugar.  Encouraged continued diet control to lower A1c.   ? ?Patient Goals/Self-Care Activities: diabetes ?Take all medications as prescribed ?Attend all scheduled provider appointments ?check blood sugar at prescribed times: once daily ?check feet daily for cuts, sores or redness ?limit fast food meals to no more than 1 per week ?manage portion size ?Limit carbohydrate and sweets intake ? ?Follow Up Plan:  Telephone follow up appointment with care management team member scheduled for:  August ?The patient has been provided with contact information for the care management team and has been advised to call with any health related questions or concerns.  ? ?  ? ?Plan: Follow-up: Patient agrees to Care Plan and Follow-up. ?Follow-up in August.  ? ?[name], RN, MSN ?[facility] Care Management ?Care Management Coordinator ?Direct Line [phone] ?Toll Free: [phone]  ?Fax: [phone] ? ?

## 2021-09-07 ENCOUNTER — Ambulatory Visit
Admission: RE | Admit: 2021-09-07 | Discharge: 2021-09-07 | Disposition: A | Discharge status: Home / Self Care | Payer: Medicare HMO | Source: Ambulatory Visit | Attending: Family Medicine | Admitting: Family Medicine

## 2021-09-07 DIAGNOSIS — Z1231 Encounter for screening mammogram for malignant neoplasm of breast: Secondary | ICD-10-CM

## 2021-09-08 ENCOUNTER — Encounter: Payer: Self-pay | Admitting: Gastroenterology

## 2021-09-11 ENCOUNTER — Telehealth: Payer: Self-pay | Admitting: Gastroenterology

## 2021-09-11 NOTE — Telephone Encounter (Signed)
Patient called regarding her Trulicity medication. Mallory Hayes she was suppose to take it on Tuesday but forgot and took medication on Wednesday . Seeking direction. Has procedure 09/14/21 ?

## 2021-09-11 NOTE — Telephone Encounter (Signed)
Patient called and notified that she is able to proceed with colon as scheduled but not to take any more Trulicity this week until after the colonoscopy-pt verbalizes understanding.  ?

## 2021-09-14 ENCOUNTER — Ambulatory Visit (AMBULATORY_SURGERY_CENTER): Payer: Medicare HMO | Admitting: Gastroenterology

## 2021-09-14 ENCOUNTER — Encounter: Payer: Self-pay | Admitting: Gastroenterology

## 2021-09-14 VITALS — BP 100/73 | HR 50 | Temp 95.5°F | Resp 17 | Ht 61.0 in | Wt 111.0 lb

## 2021-09-14 DIAGNOSIS — Z86010 Personal history of colonic polyps: Secondary | ICD-10-CM | POA: Diagnosis not present

## 2021-09-14 DIAGNOSIS — G4733 Obstructive sleep apnea (adult) (pediatric): Secondary | ICD-10-CM | POA: Diagnosis not present

## 2021-09-14 MED ORDER — SODIUM CHLORIDE 0.9 % IV SOLN: 500.0000 mL | Freq: Once | INTRAVENOUS | Status: DC

## 2021-09-14 NOTE — Op Note (Signed)
[place] Patient Name: Mallory Hayes Procedure Date: 09/14/2021 11:27 AM MRN: [id number] Endoscopist: Mallie Mussel L. Mallory Hayes , MD Age: 76 Referring MD:  Date of Birth: 02-23-1946 Gender: Female Account #: [id number] Procedure:                Colonoscopy Indications:              Surveillance: Personal history of adenomatous                            polyps on last colonoscopy > 5 years ago                           TA < 34m x 29 Jan 2014; no polyps 2010                           adenoma x 2 in 2009 Medicines:                Monitored Anesthesia Care Procedure:                Pre-Anesthesia Assessment:                           - Prior to the procedure, a History and Physical                            was performed, and patient medications and                            allergies were reviewed. The patient's tolerance of                            previous anesthesia was also reviewed. The risks                            and benefits of the procedure and the sedation                            options and risks were discussed with the patient.                            All questions were answered, and informed consent                            was obtained. Prior Anticoagulants: The patient has                            taken no previous anticoagulant or antiplatelet                            agents. ASA Grade Assessment: III - A patient with                            severe systemic disease. After reviewing the risks  and benefits, the patient was deemed in                            satisfactory condition to undergo the procedure.                           After obtaining informed consent, the colonoscope                            was passed under direct vision. Throughout the                            procedure, the patient's blood pressure, pulse, and                            oxygen saturations were monitored continuously. The                             PCF-HQ190L Colonoscope was introduced through the                            anus and advanced to the the cecum, identified by                            appendiceal orifice and ileocecal valve. The                            colonoscopy was somewhat difficult due to a                            redundant colon and significant looping. Successful                            completion of the procedure was aided by using                            manual pressure and straightening and shortening                            the scope to obtain bowel loop reduction. The                            patient tolerated the procedure well. The quality                            of the bowel preparation was good after lavage. The                            ileocecal valve, appendiceal orifice, and rectum                            were photographed. Scope In: 11:36:33 AM Scope Out: 11:58:36 AM Scope Withdrawal Time: 0 hours 15 minutes 35 seconds  Total Procedure Duration: 0  hours 22 minutes 3 seconds  Findings:                 The perianal and digital rectal examinations were                            normal.                           The entire examined colon appeared normal on direct                            and retroflexion views. Complications:            No immediate complications. Estimated Blood Loss:     Estimated blood loss: none. Impression:               - The entire examined colon is normal on direct and                            retroflexion views.                           - No specimens collected. Recommendation:           - Patient has a contact number available for                            emergencies. The signs and symptoms of potential                            delayed complications were discussed with the                            patient. Return to normal activities tomorrow.                            Written discharge instructions were provided to the                             patient.                           - Resume previous diet.                           - Continue present medications.                           - No repeat routine colonoscopy due to age and the                            absence of colonic polyps. Kylle Lall L. Mallory Carrow, MD 09/14/2021 12:02:49 PM This report has been signed electronically.

## 2021-09-14 NOTE — Patient Instructions (Signed)
Resume previous diet and medications.  No repeat colonoscopy due to age and absence of colonic polyps.   YOU HAD AN ENDOSCOPIC PROCEDURE TODAY AT [facility]:   Refer to the procedure report that was given to you for any specific questions about what was found during the examination.  If the procedure report does not answer your questions, please call your gastroenterologist to clarify.  If you requested that your care partner not be given the details of your procedure findings, then the procedure report has been included in a sealed envelope for you to review at your convenience later.  YOU SHOULD EXPECT: Some feelings of bloating in the abdomen. Passage of more gas than usual.  Walking can help get rid of the air that was put into your GI tract during the procedure and reduce the bloating. If you had a lower endoscopy (such as a colonoscopy or flexible sigmoidoscopy) you may notice spotting of blood in your stool or on the toilet paper. If you underwent a bowel prep for your procedure, you may not have a normal bowel movement for a few days.  Please Note:  You might notice some irritation and congestion in your nose or some drainage.  This is from the oxygen used during your procedure.  There is no need for concern and it should clear up in a day or so.  SYMPTOMS TO REPORT IMMEDIATELY:  Following lower endoscopy (colonoscopy or flexible sigmoidoscopy):  Excessive amounts of blood in the stool  Significant tenderness or worsening of abdominal pains  Swelling of the abdomen that is new, acute  Fever of 100F or higher  For urgent or emergent issues, a gastroenterologist can be reached at any hour by calling [phone]. Do not use MyChart messaging for urgent concerns.    DIET:  We do recommend a small meal at first, but then you may proceed to your regular diet.  Drink plenty of fluids but you should avoid alcoholic beverages for 24 hours.  ACTIVITY:  You should plan to  take it easy for the rest of today and you should NOT DRIVE or use heavy machinery until tomorrow (because of the sedation medicines used during the test).    FOLLOW UP: Our staff will call the number listed on your records 48-72 hours following your procedure to check on you and address any questions or concerns that you may have regarding the information given to you following your procedure. If we do not reach you, we will leave a message.  We will attempt to reach you two times.  During this call, we will ask if you have developed any symptoms of COVID 19. If you develop any symptoms (ie: fever, flu-like symptoms, shortness of breath, cough etc.) before then, please call [phone].  If you test positive for Covid 19 in the 2 weeks post procedure, please call and report this information to [place].    If any biopsies were taken you will be contacted by phone or by letter within the next 1-3 weeks.  Please call us at [phone] if you have not heard about the biopsies in 3 weeks.    SIGNATURES/CONFIDENTIALITY: You and/or your care partner have signed paperwork which will be entered into your electronic medical record.  These signatures attest to the fact that that the information above on your After Visit Summary has been reviewed and is understood.  Full responsibility of the confidentiality of this discharge information lies with you and/or your care-partner.

## 2021-09-14 NOTE — Progress Notes (Signed)
Report given to PACU, vss 

## 2021-09-14 NOTE — Progress Notes (Signed)
I have reviewed the patient's medical history in detail and updated the computerized patient record.

## 2021-09-14 NOTE — Progress Notes (Signed)
History and Physical:  This patient presents for endoscopic testing for: Encounter Diagnosis  Name Primary?   Hx of colonic polyps Yes    76 year old woman here today for surveillance colonoscopy. Last colonoscopy October 2015 with 2 diminutive tubular adenomas. No polyps in 2010 2009 colonoscopy removed 2 advanced adenomas.  Patient is otherwise without complaints or active issues today.   Past Medical History: Past Medical History:  Diagnosis Date   Diabetes mellitus without complication ([place])    GERD (gastroesophageal reflux disease)    Hyperlipidemia    on meds   Hypertension    OSA (obstructive sleep apnea)    no cpap   Sleep apnea      Past Surgical History: Past Surgical History:  Procedure Laterality Date   APPENDECTOMY     BREAST EXCISIONAL BIOPSY Right    benign   CHOLECYSTECTOMY     COLONOSCOPY  2011   PARTIAL HYSTERECTOMY  1991   POLYPECTOMY      Allergies: Allergies  Allergen Reactions   Amaryl [Glimepiride] Anxiety   Food Itching    Spices, peppers   Sulfamethoxazole Itching, Rash and Other (See Comments)    sneezing sneezing    Outpatient Meds: Current Outpatient Medications  Medication Sig Dispense Refill   amLODipine (NORVASC) 10 MG tablet TAKE 1 TABLET BY MOUTH EVERY DAY 90 tablet 3   aspirin 81 MG tablet Take 81 mg by mouth daily.     Calcium Carbonate Antacid (TUMS PO) Take 1 tablet by mouth daily as needed.     chlorthalidone (HYGROTON) 25 MG tablet Take 0.5 tablets (12.5 mg total) by mouth daily. 45 tablet 3   lisinopril (ZESTRIL) 40 MG tablet Take 40 mg by mouth daily.     Polyethylene Glycol 3350 (PEG 3350) 17 GM/SCOOP POWD Take 17g by mouth daily prn 850 g 1   rosuvastatin (CRESTOR) 20 MG tablet Take 1 tablet (20 mg total) by mouth daily. 90 tablet 3   TRUEplus Lancets 33G MISC Use to check blood sugar once daily 100 each 2   vitamin E 200 UNIT capsule Take by mouth daily.     Blood Glucose Monitoring Suppl (TRUE METRIX AIR  GLUCOSE METER) w/Device KIT USE AS DIRECTED 1 kit 0   Blood Pressure Monitoring (BLOOD PRESSURE MONITOR AUTOMAT) DEVI Check blood pressure 3 times per week 1 Device 0   Continuous Blood Gluc Sensor (FREESTYLE LIBRE 2 SENSOR) MISC 1 each by Does not apply route every 14 (fourteen) days. 2 each 11   Dulaglutide (TRULICITY) 1.5 KC/1.2XN SOPN Inject 1.5 mg into the skin once a week. 2 mL 2   empagliflozin (JARDIANCE) 10 MG TABS tablet Take 1 tablet (10 mg total) by mouth daily. 90 tablet 3   fluticasone (FLONASE) 50 MCG/ACT nasal spray Place 2 sprays into both nostrils daily. 16 g 11   metFORMIN (GLUCOPHAGE-XR) 500 MG 24 hr tablet TAKE 1 TABLET EVERY DAY WITH BREAKFAST 90 tablet 0   Current Facility-Administered Medications  Medication Dose Route Frequency Provider Last Rate Last Admin   0.9 %  sodium chloride infusion  500 mL Intravenous Once Doran Stabler, MD          ___________________________________________________________________ Objective   Exam:  BP (!) 132/51   Pulse 77   Temp (!) 95.5 F (35.3 C)   Ht 5' 1"  (1.549 m)   Wt 111 lb (50.3 kg)   SpO2 99%   BMI 20.97 kg/m   CV: RRR without murmur, S1/S2 Resp: clear  to auscultation bilaterally, normal RR and effort noted GI: soft, no tenderness, with active bowel sounds.   Assessment: Encounter Diagnosis  Name Primary?   Hx of colonic polyps Yes     Plan: Colonoscopy  The benefits and risks of the planned procedure were described in detail with the patient or (when appropriate) their health care proxy.  Risks were outlined as including, but not limited to, bleeding, infection, perforation, adverse medication reaction leading to cardiac or pulmonary decompensation, pancreatitis (if ERCP).  The limitation of incomplete mucosal visualization was also discussed.  No guarantees or warranties were given.    The patient is appropriate for an endoscopic procedure in the ambulatory setting.   - Wilfrid Lund, MD

## 2021-09-15 ENCOUNTER — Telehealth: Payer: Self-pay

## 2021-09-15 ENCOUNTER — Telehealth: Payer: Self-pay | Admitting: Cardiovascular Disease

## 2021-09-15 NOTE — Telephone Encounter (Signed)
Pt c/o medication issue:  1. Name of Medication: chlorthalidone (HYGROTON) 25 MG tablet  2. How are you currently taking this medication (dosage and times per day)? Half a tablet daily  3. Are you having a reaction (difficulty breathing--STAT)? no  4. What is your medication issue? Patient states the medication has to be taken with food but she forgets and also does not eat that much. She also states cutting the tablet is difficult.

## 2021-09-15 NOTE — Telephone Encounter (Signed)
2nd call attempt, no answer

## 2021-09-15 NOTE — Telephone Encounter (Signed)
3rd call attempt, no answer. LM

## 2021-09-15 NOTE — Telephone Encounter (Signed)
RN returned call to patient, no answer, LM

## 2021-09-15 NOTE — Telephone Encounter (Signed)
Pt left voicemail requesting call back.  Returned call, no answer. Left VM to return call to [phone]

## 2021-09-15 NOTE — Telephone Encounter (Signed)
  Follow up Call-     09/14/2021   10:42 AM  Call back number  Post procedure Call Back phone  # [phone]  Permission to leave phone message Yes     Patient questions:  Do you have a fever, pain , or abdominal swelling? No. Pain Score  0 *  Have you tolerated food without any problems? Yes.    Have you been able to return to your normal activities? Yes.    Do you have any questions about your discharge instructions: Diet   No. Medications  No. Follow up visit  No.  Do you have questions or concerns about your Care? Yes.    Actions: * If pain score is 4 or above: No action needed, pain <4.  Pt asked what we recommended for a "cleanse".  I asked if she had constipation and pt said"tes, I do".  I asked if she had discussed this with Dr. [name] and pt said, "no'.  I recommended pt make an appointment to discuss constipation issues,

## 2021-09-21 ENCOUNTER — Ambulatory Visit (INDEPENDENT_AMBULATORY_CARE_PROVIDER_SITE_OTHER): Payer: Medicare HMO | Admitting: Pharmacist

## 2021-09-21 DIAGNOSIS — I10 Essential (primary) hypertension: Secondary | ICD-10-CM | POA: Diagnosis not present

## 2021-09-21 DIAGNOSIS — I119 Hypertensive heart disease without heart failure: Secondary | ICD-10-CM

## 2021-09-21 DIAGNOSIS — E1169 Type 2 diabetes mellitus with other specified complication: Secondary | ICD-10-CM | POA: Diagnosis not present

## 2021-09-21 MED ORDER — HYDROCHLOROTHIAZIDE 12.5 MG PO TABS: 12.5000 mg | ORAL_TABLET | Freq: Every day | ORAL | 3 refills | Status: DC

## 2021-09-21 NOTE — Telephone Encounter (Signed)
Patient calling back.   °

## 2021-09-21 NOTE — Progress Notes (Signed)
S:     Chief Complaint  Patient presents with   Medication Management    Diabetes F/u   Mallory Hayes is a 76 y.o. female who presents for diabetes evaluation, education, and management. PMH is significant for T2DM, HTN. Patient was last seen by pharmacy clinic 08/24/21 at which time Trulicity was increased.   Today, patient arrives in good spirits and presents without any assistance. She reports that initially she was hesitant to be on Trulicity but she has been getting used to it and feels like it is working well. She reports she is not taking chlorthalidone because it is difficult for her to split in half. She also asks if she can stop metformin because she read on the internet that it is "a death sentence."  Current diabetes medications include: Jardiance (empagliflozin) 10 mg daily, Trulicity (dulaglutide) 1.5 mg weekly, metformin 500 mg daily  Current hypertension medications include: amlodipine 10 mg daily, lisinopril 40 mg daily, chlorthalidone 12.5 mg daily (has not started this medication yet) Current hyperlipidemia medications include: rosuvastatin 20 mg   Patient reports taking all medications as prescribed with the exception of chlorthalidone as above. She has taken amlodipine this morning and last took lisinopril last night.  Do you feel that your medications are working for you? yes Have you been experiencing any side effects to the medications prescribed? no Do you have any problems obtaining medications due to transportation or finances? no Insurance coverage: Medicare  Patient denies hypoglycemic events.  Reported home fasting blood sugars: not checking often, last Thursday was 144   Patient reports nocturia (nighttime urination). 2-3x/night.  Patient denies neuropathy (nerve pain). Patient reports visual changes. More floaters lately.  Patient reports self foot exams.   Patient reported dietary habits: Appetite is the same as last month  Patient-reported  exercise habits: walks every morning for 30 minutes. Feels like appetite is lower after exercising.    O:  Physical Exam Vitals reviewed.  Cardiovascular:     Rate and Rhythm: Bradycardia present.  Neurological:     Mental Status: She is alert.  Psychiatric:        Mood and Affect: Mood normal.        Behavior: Behavior normal.        Thought Content: Thought content normal.   Review of Systems  All other systems reviewed and are negative.  Lab Results  Component Value Date   HGBA1C 10.9 (A) 07/14/2021   There were no vitals filed for this visit.  Lipid Panel     Component Value Date/Time   CHOL 224 (H) 08/11/2020 1203   TRIG 95 08/11/2020 1203   HDL 82 08/11/2020 1203   CHOLHDL 2.7 08/11/2020 1203   CHOLHDL 1.8 12/02/2014 0952   VLDL 12 12/02/2014 0952   LDLCALC 126 (H) 08/11/2020 1203   LDLDIRECT 62 07/28/2012 0927    Clinical Atherosclerotic Cardiovascular Disease (ASCVD): No  The 10-year ASCVD risk score (Arnett DK, et al., 2019) is: 27.7%   Values used to calculate the score:     Age: 69 years     Sex: Female     Is Non-Hispanic African American: Yes     Diabetic: Yes     Tobacco smoker: No     Systolic Blood Pressure: 062 mmHg     Is BP treated: Yes     HDL Cholesterol: 82 mg/dL     Total Cholesterol: 224 mg/dL    A/P: Diabetes longstanding currently uncontrolled based on last  A1c. Suspect improvement in BG with increased Trulicity however patient is not checking BG routinely (once in the last month - 144). Patient is able to verbalize appropriate hypoglycemia management plan. Medication adherence appears appropriate. Will await A1c next month before making additional changes which could include increasing Jardiance or Trulicity. Encouraged patient to bring the information she read about metformin being "a death sentence" to her visit so she can discuss her concerns with her PCP.  -Continue Jardiance (empagliflozin) 10 mg daily, Trulicity (dulaglutide) 1.5  mg weekly, metformin 500 mg daily  -Extensively discussed pathophysiology of diabetes, recommended lifestyle interventions, dietary effects on blood sugar control.  -Counseled on s/sx of and management of hypoglycemia.  -Next A1c anticipated at next PCP visit in June.   Hypertension longstanding currently not at goal. Blood pressure goal of <130/80 mmHg. Medication adherence denied to chlorthalidone as she has trouble splitting the tablets in half. She called the cardiologist to ask about this but she missed their calls back. Blood pressure control is suboptimal due to this. Suggested switching chlorthalidone to hydrochlorothiazide since it comes in a 12.5 mg tablet but patient reports that her nephrologist took her off of that because of her kidney function. Will defer to cardiology and encouraged her to reach back out to them. -Continue amlodipine 10 mg daily and lisinopril 40 mg daily.  -Follow up with cardiology regarding chlorthalidone.   Written patient instructions provided. Patient verbalized understanding of treatment plan. Total time in face to face counseling 34 minutes.    Follow up PCP clinic visit in 1 month. Patient seen with Rebbeca Paul, PharmD, PGY2 Pharmacy Resident.

## 2021-09-21 NOTE — Telephone Encounter (Signed)
Left message to call back  

## 2021-09-21 NOTE — Assessment & Plan Note (Signed)
Hypertension longstanding currently not at goal. Blood pressure goal of <130/80 mmHg. Medication adherence denied to chlorthalidone as Mallory Hayes has trouble splitting the tablets in half. Mallory Hayes called the cardiologist to ask about this but Mallory Hayes missed their calls back. Blood pressure control is suboptimal due to this. Suggested switching chlorthalidone to hydrochlorothiazide since it comes in a 12.5 mg tablet but patient reports that her nephrologist took her off of that because of her kidney function. Will defer to cardiology and encouraged her to reach back out to them. -Continue amlodipine 10 mg daily and lisinopril 40 mg daily.  -Follow up with cardiology regarding chlorthalidone.

## 2021-09-21 NOTE — Assessment & Plan Note (Signed)
Hypertension longstanding currently not at goal. Blood pressure goal of <130/80 mmHg. Medication adherence denied to chlorthalidone as she has trouble splitting the tablets in half. She called the cardiologist to ask about this but she missed their calls back. Blood pressure control is suboptimal due to this. Suggested switching chlorthalidone to hydrochlorothiazide since it comes in a 12.5 mg tablet but patient reports that her nephrologist took her off of that because of her kidney function. Will defer to cardiology and encouraged her to reach back out to them. -Continue amlodipine 10 mg daily and lisinopril 40 mg daily.  -Follow up with cardiology regarding chlorthalidone.

## 2021-09-21 NOTE — Patient Instructions (Addendum)
It was nice to see you today!  Your goal blood sugar is 80-130 before eating and less than 180 after eating.  Medication Changes: Reach back out to your cardiologist about chlorthalidone.   Continue all other medications as they are currently prescribed.   Monitor blood sugars at home and keep a log (glucometer or piece of paper) to bring with you to your next visit.  Keep up the good work with diet and exercise. Aim for a diet full of vegetables, fruit and lean meats (chicken, [place], fish). Try to limit salt intake by eating fresh or frozen vegetables (instead of canned), rinse canned vegetables prior to cooking and do not add any additional salt to meals.

## 2021-09-21 NOTE — Assessment & Plan Note (Signed)
Diabetes longstanding currently uncontrolled based on last A1c. Suspect improvement in BG with increased Trulicity however patient is not checking BG routinely (once in the last month - 144). Patient is able to verbalize appropriate hypoglycemia management plan. Medication adherence appears appropriate. Will await A1c next month before making additional changes which could include increasing Jardiance or Trulicity. Encouraged patient to bring the information she read about metformin being "a death sentence" to her visit so she can discuss her concerns with her PCP.  -Continue Jardiance (empagliflozin) 10 mg daily, Trulicity (dulaglutide) 1.5 mg weekly, metformin 500 mg daily  -Extensively discussed pathophysiology of diabetes, recommended lifestyle interventions, dietary effects on blood sugar control.  -Counseled on s/sx of and management of hypoglycemia.  -Next A1c anticipated at next PCP visit in June.

## 2021-09-21 NOTE — Progress Notes (Signed)
Reviewed: I agree with Dr. Koval's documentation and management. 

## 2021-09-22 NOTE — Telephone Encounter (Signed)
RN returned call to patient and left message with the following instructions.    Verbal from [name], NP in clinic- okay to take meds without food and recommend purchasing a pill splitter.

## 2021-09-26 ENCOUNTER — Other Ambulatory Visit: Payer: Self-pay | Admitting: Family Medicine

## 2021-09-28 ENCOUNTER — Telehealth (HOSPITAL_BASED_OUTPATIENT_CLINIC_OR_DEPARTMENT_OTHER): Payer: Self-pay

## 2021-09-28 ENCOUNTER — Ambulatory Visit (INDEPENDENT_AMBULATORY_CARE_PROVIDER_SITE_OTHER): Payer: Medicare HMO

## 2021-09-28 DIAGNOSIS — I350 Nonrheumatic aortic (valve) stenosis: Secondary | ICD-10-CM | POA: Diagnosis not present

## 2021-09-28 LAB — ECHOCARDIOGRAM COMPLETE
AR max vel: 1.29 cm2
AV Area VTI: 1.36 cm2
AV Area mean vel: 1.36 cm2
AV Mean grad: 5 mmHg
AV Peak grad: 11.6 mmHg
Ao pk vel: 1.7 m/s
Area-P 1/2: 2.16 cm2
Calc EF: 71.8 %
Single Plane A2C EF: 76.6 %
Single Plane A4C EF: 63.9 %

## 2021-09-28 NOTE — Telephone Encounter (Addendum)
Results called to patient who verbalizes understanding!     ----- Message from Loel Dubonnet, NP sent at 09/28/2021  4:21 PM EDT ----- Echo with normal heart pumping function. Heart muscle moderately thick. No significant valvular abnormalities. No aortic stenosis. Good result! Important we keep BP well controlled to help prevent heart muscle from thickening further.

## 2021-10-09 ENCOUNTER — Telehealth: Payer: Self-pay | Admitting: Family

## 2021-10-09 NOTE — Telephone Encounter (Signed)
RN returned call to patient and provided the following recommendations. [place] updated.    "Discontinue Chlorthalidone. Check BP once per day and report log in 1 week. Due for BMP which was previously ordered, please have her go get this done. Can consider an additional medication depending what her BP log from home and lab results are.   Loel Dubonnet, NP "

## 2021-10-09 NOTE — Telephone Encounter (Signed)
RN returned call to patient. Patient states she does not feel like she thinks she does. Patient thinks she may be having reaction to the Chlorthalidone. Patient states she has been taking  the medication since April 23rd, but endorses having a reaction ever since starting but has been forcing herself to keep taking them. She also endorses having missed at least 4 or more doses. She endorses feeling dizzy, weak and tired, with not enough energy. Patient states she took this medication once before and did not like it. She also does not like having to cut her pill in half with a pill splitter.   Patient would not like to continue on this medication and would like to know if there is something else that she can take.

## 2021-10-09 NOTE — Telephone Encounter (Signed)
Pt c/o medication issue:  1. Name of Medication: Chlorthalidone 12.'5mg'$  (half tablet) daily   2. How are you currently taking this medication (dosage and times per day)? As prescribed  3. Are you having a reaction (difficulty breathing--STAT)? Yes.   4. What is your medication issue?  Pt says she feels weak and dizzy, like she might fall.  Patient states this medication is too strong for her and she wants to change this.

## 2021-10-09 NOTE — Telephone Encounter (Signed)
Discontinue Chlorthalidone. Check BP once per day and report log in 1 week. Due for BMP which was previously ordered, please have her go get this done. Can consider an additional medication depending what her BP log from home and lab results are.  Loel Dubonnet, NP

## 2021-10-16 ENCOUNTER — Ambulatory Visit: Payer: Medicare HMO | Admitting: Family Medicine

## 2021-10-16 NOTE — Progress Notes (Unsigned)
    SUBJECTIVE:   CHIEF COMPLAINT / HPI:   T2DM- could increase Jardiance or Trulicity  PERTINENT  PMH / PSH: ***  OBJECTIVE:   There were no vitals taken for this visit.  ***  ASSESSMENT/PLAN:   No problem-specific Assessment & Plan notes found for this encounter.     Lenoria Chime, MD [place]

## 2021-10-20 ENCOUNTER — Encounter: Payer: Self-pay | Admitting: Family Medicine

## 2021-10-20 ENCOUNTER — Ambulatory Visit (INDEPENDENT_AMBULATORY_CARE_PROVIDER_SITE_OTHER): Payer: Medicare HMO | Admitting: Family Medicine

## 2021-10-20 VITALS — BP 138/68 | HR 62 | Ht 61.0 in | Wt 104.0 lb

## 2021-10-20 DIAGNOSIS — E1169 Type 2 diabetes mellitus with other specified complication: Secondary | ICD-10-CM

## 2021-10-20 DIAGNOSIS — E785 Hyperlipidemia, unspecified: Secondary | ICD-10-CM

## 2021-10-20 DIAGNOSIS — I10 Essential (primary) hypertension: Secondary | ICD-10-CM | POA: Diagnosis not present

## 2021-10-20 LAB — POCT GLYCOSYLATED HEMOGLOBIN (HGB A1C): HbA1c, POC (controlled diabetic range): 8.5 % — AB (ref 0.0–7.0)

## 2021-10-20 MED ORDER — CARVEDILOL 3.125 MG PO TABS: 3.1250 mg | ORAL_TABLET | Freq: Two times a day (BID) | ORAL | 0 refills | Status: DC

## 2021-10-25 LAB — LIPID PANEL
Chol/HDL Ratio: 2 ratio (ref 0.0–4.4)
Cholesterol, Total: 140 mg/dL (ref 100–199)
HDL: 70 mg/dL (ref >39)
LDL Chol Calc (NIH): 55 mg/dL (ref 0–99)
Triglycerides: 75 mg/dL (ref 0–149)
VLDL Cholesterol Cal: 15 mg/dL (ref 5–40)

## 2021-10-25 LAB — BASIC METABOLIC PANEL
BUN/Creatinine Ratio: 15 (ref 12–28)
BUN: 17 mg/dL (ref 8–27)
CO2: 19 mmol/L — ABNORMAL LOW (ref 20–29)
Calcium: 9.9 mg/dL (ref 8.7–10.3)
Chloride: 102 mmol/L (ref 96–106)
Creatinine, Ser: 1.17 mg/dL — ABNORMAL HIGH (ref 0.57–1.00)
Glucose: 140 mg/dL — ABNORMAL HIGH (ref 70–99)
Potassium: 4 mmol/L (ref 3.5–5.2)
Sodium: 140 mmol/L (ref 134–144)
eGFR: 49 mL/min/{1.73_m2} — ABNORMAL LOW (ref >59)

## 2021-11-13 ENCOUNTER — Other Ambulatory Visit: Payer: Self-pay | Admitting: Family Medicine

## 2021-11-28 ENCOUNTER — Other Ambulatory Visit: Payer: Self-pay

## 2021-11-28 NOTE — Patient Outreach (Signed)
[facility]) Care Management  11/28/2021  Mallory Hayes 06-23-1945 [id number]   Telephone call to patient for follow up. A1c down to 8.5.  Encouraged patient to continue.  No cocnerns.  Care Plan : RN CM Care Plan  Updates made by Mallory Billings, RN since 11/28/2021 12:00 AM     Problem: Chronic Disease Management and Care Coordination Needs Diabetes   Priority: High     Long-Range Goal: Development of Plan of Care for Management of  diabetes   Start Date: 11/28/2021  Expected End Date: 04/29/2022  This Visit's Progress: On track  Priority: High  Note:   Current Barriers:  Chronic Disease Management support and education needs related to DMII   RNCM Clinical Goal(s):  Patient will verbalize basic understanding of  DMII disease process and self health management plan as evidenced by A1c less than 9.0 continue to work with RN Care Manager to address care management and care coordination needs related to  DMII as evidenced by adherence to CM Team Scheduled appointments through collaboration with RN Care manager, provider, and care team.   Interventions: Education and support related to diabetes Inter-disciplinary care team collaboration (see longitudinal plan of care) Evaluation of current treatment plan related to  self management and patient's adherence to plan as established by provider   Diabetes Interventions:  (Status:  Goal on track:  Yes.) Long Term Goal Assessed patient's understanding of A1c goal: <7% Provided education to patient about basic DM disease process Reviewed medications with patient and discussed importance of medication adherence Discussed plans with patient for ongoing care management follow up and provided patient with direct contact information for care management team Lab Results  Component Value Date   HGBA1C 10.9 (A) 07/14/2021  09/05/21 Patient reports feeling bad today recent tick bite.  Patient to see physician today to check to make  sure things are ok.  Recent start of trulicity due to J0D 32.6.  Discussed adjustment to medication and lower sugar.  Encouraged continued diet control to lower A1c.    11/28/21 Patient A1c down to 8.5.  Encouraged patient to continue. No concerns.   Patient Goals/Self-Care Activities: Take all medications as prescribed Attend all scheduled provider appointments check blood sugar at prescribed times: once daily check feet daily for cuts, sores or redness limit fast food meals to no more than 1 per week manage portion size Limit Carbohydrates and sweets  Follow Up Plan:  Telephone follow up appointment with care management team member scheduled for:  October The patient has been provided with contact information for the care management team and has been advised to call with any health related questions or concerns.      Plan: Follow-up: Patient agrees to Care Plan and Follow-up. Follow-up in October.  Mallory Baseman, RN, MSN [facility] Care Management Care Management Coordinator Direct Line [phone] Toll Free: [phone]  Fax: [phone]

## 2021-11-28 NOTE — Patient Instructions (Signed)
Patient Goals/Self-Care Activities: Take all medications as prescribed Attend all scheduled provider appointments check blood sugar at prescribed times: once daily check feet daily for cuts, sores or redness limit fast food meals to no more than 1 per week manage portion size Limit Carbohydrates and sweets

## 2021-12-01 ENCOUNTER — Other Ambulatory Visit: Payer: Self-pay | Admitting: Family Medicine

## 2021-12-07 ENCOUNTER — Telehealth: Payer: Self-pay | Admitting: *Deleted

## 2021-12-07 DIAGNOSIS — E1169 Type 2 diabetes mellitus with other specified complication: Secondary | ICD-10-CM

## 2021-12-07 NOTE — Telephone Encounter (Signed)
Pt is requesting a new [facility]. Christen Bame, CMA

## 2021-12-07 NOTE — Telephone Encounter (Signed)
Referral entered Please ask patient to schedule a follow up visit with me for her diabetes, late September would be best to recheck her A1c.  Thanks [name], MD

## 2021-12-08 ENCOUNTER — Ambulatory Visit (INDEPENDENT_AMBULATORY_CARE_PROVIDER_SITE_OTHER): Payer: Medicare HMO | Admitting: Podiatry

## 2021-12-08 ENCOUNTER — Ambulatory Visit: Payer: Medicare HMO | Admitting: Podiatry

## 2021-12-08 ENCOUNTER — Encounter: Payer: Self-pay | Admitting: Podiatry

## 2021-12-08 DIAGNOSIS — N183 Chronic kidney disease, stage 3 unspecified: Secondary | ICD-10-CM

## 2021-12-08 DIAGNOSIS — M79675 Pain in left toe(s): Secondary | ICD-10-CM | POA: Diagnosis not present

## 2021-12-08 DIAGNOSIS — E1169 Type 2 diabetes mellitus with other specified complication: Secondary | ICD-10-CM

## 2021-12-08 DIAGNOSIS — M79674 Pain in right toe(s): Secondary | ICD-10-CM

## 2021-12-08 DIAGNOSIS — B351 Tinea unguium: Secondary | ICD-10-CM | POA: Diagnosis not present

## 2021-12-08 NOTE — Progress Notes (Signed)
This patient returns to my office for at risk foot care.  This patient requires this care by a professional since this patient will be at risk due to having CKD stage 3 and type 2 diabetes.   This patient is unable to cut nails herself since the patient cannot reach her nails.These nails are painful walking and wearing shoes.  This patient presents for at risk foot care today.  General Appearance  Alert, conversant and in no acute stress.  Vascular  Dorsalis pedis and posterior tibial  pulses are palpable  bilaterally.  Capillary return is within normal limits  bilaterally. Temperature is within normal limits  bilaterally.  Neurologic  Senn-Weinstein monofilament wire test within normal limits  bilaterally. Muscle power within normal limits bilaterally.  Nails Thick disfigured discolored nails with subungual debris  from hallux to fifth toes bilaterally. No evidence of bacterial infection or drainage bilaterally.  Orthopedic  No limitations of motion  feet .  No crepitus or effusions noted.  No bony pathology or digital deformities noted.  HAV  B/L and tailors bunion  B/L.  Skin  normotropic skin with no porokeratosis noted bilaterally.  No signs of infections or ulcers noted.     Onychomycosis  Pain in right toes  Pain in left toes  Consent was obtained for treatment procedures.   Mechanical debridement of nails 1-5  bilaterally performed with a nail nipper.  Filed with dremel without incident.    Return office visit     3 months                 Told patient to return for periodic foot care and evaluation due to potential at risk complications.   Lindsey Demonte DPM  

## 2021-12-12 ENCOUNTER — Other Ambulatory Visit: Payer: Self-pay

## 2021-12-13 ENCOUNTER — Other Ambulatory Visit: Payer: Self-pay | Admitting: Family Medicine

## 2021-12-13 MED ORDER — FLUTICASONE PROPIONATE 50 MCG/ACT NA SUSP: 2.0000 | Freq: Every day | NASAL | 3 refills | Status: DC

## 2021-12-27 ENCOUNTER — Encounter (HOSPITAL_BASED_OUTPATIENT_CLINIC_OR_DEPARTMENT_OTHER): Payer: Self-pay | Admitting: Cardiovascular Disease

## 2021-12-27 ENCOUNTER — Other Ambulatory Visit: Payer: Self-pay

## 2021-12-27 ENCOUNTER — Ambulatory Visit (INDEPENDENT_AMBULATORY_CARE_PROVIDER_SITE_OTHER): Payer: Medicare HMO | Admitting: Cardiovascular Disease

## 2021-12-27 VITALS — BP 114/62 | HR 55 | Ht 61.0 in | Wt 99.7 lb

## 2021-12-27 DIAGNOSIS — R001 Bradycardia, unspecified: Secondary | ICD-10-CM

## 2021-12-27 DIAGNOSIS — I509 Heart failure, unspecified: Secondary | ICD-10-CM | POA: Diagnosis not present

## 2021-12-27 DIAGNOSIS — I10 Essential (primary) hypertension: Secondary | ICD-10-CM

## 2021-12-27 DIAGNOSIS — I5030 Unspecified diastolic (congestive) heart failure: Secondary | ICD-10-CM

## 2021-12-27 DIAGNOSIS — G4733 Obstructive sleep apnea (adult) (pediatric): Secondary | ICD-10-CM | POA: Diagnosis not present

## 2021-12-27 DIAGNOSIS — E7800 Pure hypercholesterolemia, unspecified: Secondary | ICD-10-CM

## 2021-12-27 DIAGNOSIS — I130 Hypertensive heart and chronic kidney disease with heart failure and stage 1 through stage 4 chronic kidney disease, or unspecified chronic kidney disease: Secondary | ICD-10-CM | POA: Diagnosis not present

## 2021-12-27 HISTORY — DX: Pure hypercholesterolemia, unspecified: E78.00

## 2021-12-27 NOTE — Progress Notes (Deleted)
Cardiology Office Note   Date:  12/27/2021   ID:  Mallory, Hayes 09-05-1945, MRN [id number]  PCP:  [name], MD  Cardiologist:   Skeet Latch, MD   No chief complaint on file.  History of Present Illness: Mallory Hayes is a 76 y.o. female hypertension, hyperlipidemia, OSA on CPAP, and diabetes who presents for follow up.  She was seen 12/2015 for an evaluation of bradycardia. Mallory Hayes notes that her heart rate has been low for years.  She saw her PCP and was noted that her heart rate was in the 46. She was referred to cardiology for evaluation. At the time she was active and had no exertional symptoms. Echocardiogram revealed LVEF 55 to 60% with grade 1 diastolic dysfunction.  Mallory Hayes reported an episode of exertional chest pain while doing yardwork.  She had a [employer] 10/2019 that was negative for ischemia.  She had an echo 10/219 that revealed LVEF 60-65% and grade 2 diastolic dysfunction.  She notes that she stopped taking rosuvastatin because she didn't want to take so many pills.  She reported some palpitations.  She wore an ambulatory monitor 01/2020 that revealed occasional PACs and PVCs.  There are episodes when she pressed her monitor in sinus rhythm with underlying.    She was switched to Tribenzor from lisinopril, amlodipine, and furosemide.  However she decided not to take it due to concern for potential side effects.  She is back on amlodipine, lisinopril, and furosemide. At her last appointment, she was willing to try HCTZ, and furosemide was discontinued.  At her last visit she reported some occasional pinching chest pain when lying down but exercise without difficulty.  She saw [name], NP on 07/2021 and was doing well.  She was working with her PCP to better control her hemoglobin A1c.  She noted that her nephrologist had discontinued her HCTZ and increased her lisinopril.  Her blood pressure at that visit was poorly controlled.  Her GFR had  increased to 51 and chlorthalidone 12.5 mg was added.  She called the office reporting that she was feeling weak and dizzy on chlorthalidone.  Chlorthalidone was discontinued and her labs were stable.       Past Medical History:  Diagnosis Date   Diabetes mellitus without complication (HCC)    GERD (gastroesophageal reflux disease)    Hyperlipidemia    on meds   Hypertension    OSA (obstructive sleep apnea)    no cpap   Sleep apnea     Past Surgical History:  Procedure Laterality Date   APPENDECTOMY     BREAST EXCISIONAL BIOPSY Right    benign   CHOLECYSTECTOMY     COLONOSCOPY  2011   PARTIAL HYSTERECTOMY  1991   POLYPECTOMY       Current Outpatient Medications  Medication Sig Dispense Refill   Dulaglutide (TRULICITY) 1.5 IO/9.6EX SOPN INJECT 1.5 MG INTO THE SKIN ONCE A WEEK. 6 mL 2   amLODipine (NORVASC) 10 MG tablet TAKE 1 TABLET BY MOUTH EVERY DAY 90 tablet 3   aspirin 81 MG tablet Take 81 mg by mouth daily.     Blood Glucose Monitoring Suppl (TRUE METRIX AIR GLUCOSE METER) w/Device KIT USE AS DIRECTED 1 kit 0   Blood Pressure Monitoring (BLOOD PRESSURE MONITOR AUTOMAT) DEVI Check blood pressure 3 times per week 1 Device 0   Calcium Carbonate Antacid (TUMS PO) Take 1 tablet by mouth daily as needed.     carvedilol (  COREG) 3.125 MG tablet TAKE 1 TABLET BY MOUTH TWICE A DAY WITH FOOD 60 tablet 1   Continuous Blood Gluc Sensor (FREESTYLE LIBRE 2 SENSOR) MISC 1 each by Does not apply route every 14 (fourteen) days. 2 each 11   empagliflozin (JARDIANCE) 10 MG TABS tablet Take 1 tablet (10 mg total) by mouth daily. 90 tablet 3   fluticasone (FLONASE) 50 MCG/ACT nasal spray Place 2 sprays into both nostrils daily. 16 g 3   lisinopril (ZESTRIL) 40 MG tablet Take 40 mg by mouth daily.     metFORMIN (GLUCOPHAGE-XR) 500 MG 24 hr tablet TAKE 1 TABLET EVERY DAY WITH BREAKFAST 90 tablet 0   Polyethylene Glycol 3350 (PEG 3350) 17 GM/SCOOP POWD Take 17g by mouth daily prn 850 g 1    rosuvastatin (CRESTOR) 20 MG tablet TAKE 1 TABLET EVERY DAY 90 tablet 3   TRUEplus Lancets 33G MISC Use to check blood sugar once daily 100 each 2   vitamin E 200 UNIT capsule Take by mouth daily.     Current Facility-Administered Medications  Medication Dose Route Frequency Provider Last Rate Last Admin   0.9 %  sodium chloride infusion  500 mL Intravenous Once Danis, Kirke Corin, MD        Allergies:   Amaryl [glimepiride], Food, and Sulfamethoxazole    Social History:  The patient  reports that she has never smoked. She has never used smokeless tobacco. She reports that she does not drink alcohol and does not use drugs.   Family History:  The patient's family history includes Colon cancer (age of onset: 68) in her mother; Diabetes in her brother; Heart attack in her maternal grandfather; Hypertension in her brother; Other in her father; Ovarian cancer in her mother; Prostate cancer in her brother; Thyroid disease in her maternal grandmother.    ROS:   Please see the history of present illness. (+) "Pinching" pain in central chest All other systems are reviewed and negative.     PHYSICAL EXAM: VS:  There were no vitals taken for this visit. , BMI There is no height or weight on file to calculate BMI. GENERAL:  Well appearing HEENT: Pupils equal round and reactive, fundi not visualized, oral mucosa unremarkable NECK:  No jugular venous distention, waveform within normal limits, carotid upstroke brisk and symmetric, no bruits LUNGS:  Clear to auscultation bilaterally HEART:  Bradycardic.  Regular rhythm.  PMI not displaced or sustained,S1 and S2 within normal limits, no S3, no S4, no clicks, no rubs, II/VI systolic murmur ABD:  Flat, positive bowel sounds normal in frequency in pitch, no bruits, no rebound, no guarding, no midline pulsatile mass, no hepatomegaly, no splenomegaly EXT:  2 plus pulses throughout, no edema, no cyanosis no clubbing SKIN:  No rashes no nodules NEURO:   Cranial nerves II through XII grossly intact, motor grossly intact throughout PSYCH:  Cognitively intact, oriented to person place and time  EKG:   09/22/2020: Sinus bradycardia. Rate 47 bpm. 03/22/2020: EKG was not ordered. 01/28/2020: Sinus bradycardia. Rate 49 bpm. 01/27/16: Sinus bradycardia. Rate 48 bpm.  Lexiscan Myoview 11/25/19: There were transient T wave changes in the inferolateral leads during infusion that rapidly reolved in recovery Nuclear stress EF: 58%. The left ventricular ejection fraction is normal (55-65%). The study is normal. This is a low risk study.  Echo 02/03/2018: Study Conclusions   - Left ventricle: The cavity size was normal. Wall thickness was    normal. Systolic function was normal. The estimated  ejection    fraction was in the range of 60% to 65%. Wall motion was normal;    there were no regional wall motion abnormalities. Features are    consistent with a pseudonormal left ventricular filling pattern,    with concomitant abnormal relaxation and increased filling    pressure (grade 2 diastolic dysfunction).  - Aortic valve: Mildly calcified annulus. There was mild stenosis.    Valve area (VTI): 1.52 cm^2. Valve area (Vmax): 1.54 cm^2. Valve    area (Vmean): 1.65 cm^2.  - Mitral valve: There was mild regurgitation.  3-day ZIO monitor 01/2020:  Quality: Fair.  Baseline artifact. Predominant rhythm: Sinus bradycardia Average heart rate: 55 bpm Max heart rate: 139 bpm Min heart rate: 34 bpm Pauses >2.5 seconds: None  Occasional PVCs and PACs.    Patient did submit a symptom diary.  She pushed the button twice at which time sinus rhythm was noted.   Recent Labs: 10/20/2021: BUN 17; Creatinine, Ser 1.17; Potassium 4.0; Sodium 140    Lipid Panel    Component Value Date/Time   CHOL 140 10/20/2021 1216   TRIG 75 10/20/2021 1216   HDL 70 10/20/2021 1216   CHOLHDL 2.0 10/20/2021 1216   CHOLHDL 1.8 12/02/2014 0952   VLDL 12 12/02/2014 0952    LDLCALC 55 10/20/2021 1216   LDLDIRECT 62 07/28/2012 0927      Wt Readings from Last 3 Encounters:  10/20/21 104 lb (47.2 kg)  09/21/21 107 lb 3.2 oz (48.6 kg)  09/14/21 111 lb (50.3 kg)     ASSESSMENT AND PLAN: No problem-specific Assessment & Plan notes found for this encounter.   Current medicines are reviewed at length with the patient today.  The patient does not have concerns regarding medicines.  The following changes have been made:  no change  Labs/ tests ordered today include:   No orders of the defined types were placed in this encounter.    Disposition:   FU with Anitha Kreiser C. Oval Linsey, MD, [facility] in 6 months.   This note was written with the assistance of speech recognition software.  Please excuse any transcriptional errors.     Signed, Denyce Harr C. Oval Linsey, MD, [facility]  12/27/2021 5:53 AM    [place]

## 2021-12-27 NOTE — Progress Notes (Signed)
Cardiology Office Note   Date:  12/27/2021   ID:  Mallory, Hayes 1946/03/25, MRN [id number]  PCP:  [name], MD  Cardiologist:   Skeet Latch, MD   No chief complaint on file.  History of Present Illness: Mallory Hayes is a 76 y.o. female hypertension, hyperlipidemia, OSA on CPAP, and diabetes who presents for follow up.  She was seen 12/2015 for an evaluation of bradycardia. Mallory Hayes notes that her heart rate has been low for years.  She saw her PCP and was noted that her heart rate was in the 46. She was referred to cardiology for evaluation. At the time she was active and had no exertional symptoms. Echocardiogram revealed LVEF 55 to 60% with grade 1 diastolic dysfunction.  Mallory Hayes reported an episode of exertional chest pain while doing yardwork.  She had a [employer] 10/2019 that was negative for ischemia.  She had an echo 10/219 that revealed LVEF 60-65% and grade 2 diastolic dysfunction.  She notes that she stopped taking rosuvastatin because she didn't want to take so many pills.  She reported some palpitations.  She wore an ambulatory monitor 01/2020 that revealed occasional PACs and PVCs.  There are episodes when she pressed her monitor in sinus rhythm with underlying.    She was switched to Tribenzor from lisinopril, amlodipine, and furosemide.  However she decided not to take it due to concern for potential side effects.  She is back on amlodipine, lisinopril, and furosemide. At her last appointment, she was willing to try HCTZ, and furosemide was discontinued.  At her last visit she reported some occasional pinching chest pain when lying down but exercise without difficulty.  She saw Mallory Montana, NP on 07/2021 and was doing well.  She was working with her PCP to better control her hemoglobin A1c.  She noted that her nephrologist had discontinued her HCTZ and increased her lisinopril.  Her blood pressure at that visit was poorly controlled.  Her GFR had  increased to 51 and chlorthalidone 12.5 mg was added.  She called the office reporting that she was feeling weak and dizzy on chlorthalidone.  Chlorthalidone was discontinued and her labs were stable.  She was started on carvedilol and is tolerating it well.   Today, she states she has been feeling well and that this has been one of her good days. She remains active and states that she walks "all the time." Some yard work and formal exercises are part of her normal activities. She states that she enjoys exercising. She denies any palpitations, chest pain, shortness of breath, or peripheral edema. No lightheadedness, headaches, syncope, orthopnea, or PND.   Past Medical History:  Diagnosis Date   Diabetes mellitus without complication (HCC)    GERD (gastroesophageal reflux disease)    Hyperlipidemia    on meds   Hypertension    OSA (obstructive sleep apnea)    no cpap   Pure hypercholesterolemia 12/27/2021   Sleep apnea     Past Surgical History:  Procedure Laterality Date   APPENDECTOMY     BREAST EXCISIONAL BIOPSY Right    benign   CHOLECYSTECTOMY     COLONOSCOPY  2011   PARTIAL HYSTERECTOMY  1991   POLYPECTOMY       Current Outpatient Medications  Medication Sig Dispense Refill   amLODipine (NORVASC) 10 MG tablet TAKE 1 TABLET BY MOUTH EVERY DAY 90 tablet 3   aspirin 81 MG tablet Take 81 mg by mouth daily.  Blood Glucose Monitoring Suppl (TRUE METRIX AIR GLUCOSE METER) w/Device KIT USE AS DIRECTED 1 kit 0   Blood Pressure Monitoring (BLOOD PRESSURE MONITOR AUTOMAT) DEVI Check blood pressure 3 times per week 1 Device 0   Calcium Carbonate Antacid (TUMS PO) Take 1 tablet by mouth daily as needed.     carvedilol (COREG) 3.125 MG tablet TAKE 1 TABLET BY MOUTH TWICE A DAY WITH FOOD 60 tablet 1   Continuous Blood Gluc Sensor (FREESTYLE LIBRE 2 SENSOR) MISC 1 each by Does not apply route every 14 (fourteen) days. 2 each 11   Dulaglutide (TRULICITY) 1.5 BP/1.0CH SOPN INJECT 1.5 MG  INTO THE SKIN ONCE A WEEK. 6 mL 2   empagliflozin (JARDIANCE) 10 MG TABS tablet Take 1 tablet (10 mg total) by mouth daily. 90 tablet 3   fluticasone (FLONASE) 50 MCG/ACT nasal spray Place 2 sprays into both nostrils daily. 16 g 3   lisinopril (ZESTRIL) 40 MG tablet Take 40 mg by mouth daily.     metFORMIN (GLUCOPHAGE-XR) 500 MG 24 hr tablet TAKE 1 TABLET EVERY DAY WITH BREAKFAST 90 tablet 0   Polyethylene Glycol 3350 (PEG 3350) 17 GM/SCOOP POWD Take 17g by mouth daily prn 850 g 1   rosuvastatin (CRESTOR) 20 MG tablet TAKE 1 TABLET EVERY DAY 90 tablet 3   TRUEplus Lancets 33G MISC Use to check blood sugar once daily 100 each 2   vitamin E 200 UNIT capsule Take by mouth daily.     Current Facility-Administered Medications  Medication Dose Route Frequency Provider Last Rate Last Admin   0.9 %  sodium chloride infusion  500 mL Intravenous Once Danis, Kirke Corin, MD        Allergies:   Amaryl [glimepiride], Food, and Sulfamethoxazole    Social History:  The patient  reports that she has never smoked. She has never used smokeless tobacco. She reports that she does not drink alcohol and does not use drugs.   Family History:  The patient's family history includes Colon cancer (age of onset: 91) in her mother; Diabetes in her brother; Heart attack in her maternal grandfather; Hypertension in her brother; Other in her father; Ovarian cancer in her mother; Prostate cancer in her brother; Thyroid disease in her maternal grandmother.    ROS:   Please see the history of present illness. All other systems are reviewed and negative.     PHYSICAL EXAM: VS:  BP 114/62 (BP Location: Right Arm, Patient Position: Sitting, Cuff Size: Normal)   Pulse (!) 55   Ht 5' 1" (1.549 m)   Wt 99 lb 11.2 oz (45.2 kg)   SpO2 97%   BMI 18.84 kg/m  , BMI Body mass index is 18.84 kg/m. GENERAL:  Well appearing HEENT: Pupils equal round and reactive, fundi not visualized, oral mucosa unremarkable NECK:  No  jugular venous distention, waveform within normal limits, carotid upstroke brisk and symmetric, no bruits LUNGS:  Clear to auscultation bilaterally HEART:  Bradycardic.  Regular rhythm.  PMI not displaced or sustained,S1 and S2 within normal limits, no S3, no S4, no clicks, no rubs, II/VI systolic murmur ABD:  Flat, positive bowel sounds normal in frequency in pitch, no bruits, no rebound, no guarding, no midline pulsatile mass, no hepatomegaly, no splenomegaly EXT:  2 plus pulses throughout, no edema, no cyanosis no clubbing SKIN:  No rashes no nodules NEURO:  Cranial nerves II through XII grossly intact, motor grossly intact throughout PSYCH:  Cognitively intact, oriented to person place  and time  EKG: EKG is personally reviewed.  12/27/21: EKG was not ordered.   09/22/2020: Sinus bradycardia. Rate 47 bpm. 03/22/2020: EKG was not ordered. 01/28/2020: Sinus bradycardia. Rate 49 bpm. 01/27/16: Sinus bradycardia. Rate 48 bpm.   Other studies reviewed:   Echo 09/28/2021: 1. GLS is borderline low. Left ventricular ejection fraction, by  estimation, is 60 to 65%. The left ventricle has normal function. The left  ventricle has no regional wall motion abnormalities. There is moderate  left ventricular hypertrophy. Left  ventricular diastolic parameters are indeterminate. The average left  ventricular global longitudinal strain is -17.0 %.   2. Right ventricular systolic function is normal. The right ventricular  size is normal. Tricuspid regurgitation signal is inadequate for assessing  PA pressure.   3. The mitral valve is grossly normal. No evidence of mitral valve  regurgitation.   4. Aortic valve regurgitation is not visualized. Aortic valve  sclerosis/calcification is present, without any evidence of aortic  stenosis.   5. The inferior vena cava is normal in size with greater than 50%  respiratory variability, suggesting right atrial pressure of 3 mmHg.   Comparison(s): No prior  Echocardiogram.    3-day ZIO monitor 01/2020:  Quality: Fair.  Baseline artifact. Predominant rhythm: Sinus bradycardia Average heart rate: 55 bpm Max heart rate: 139 bpm Min heart rate: 34 bpm Pauses >2.5 seconds: None  Occasional PVCs and PACs.    Patient did submit a symptom diary.  She pushed the button twice at which time sinus rhythm was noted.  Lexiscan Myoview 11/25/19: There were transient T wave changes in the inferolateral leads during infusion that rapidly reolved in recovery Nuclear stress EF: 58%. The left ventricular ejection fraction is normal (55-65%). The study is normal. This is a low risk study.  Echo 02/03/2018: Study Conclusions   - Left ventricle: The cavity size was normal. Wall thickness was    normal. Systolic function was normal. The estimated ejection    fraction was in the range of 60% to 65%. Wall motion was normal;    there were no regional wall motion abnormalities. Features are    consistent with a pseudonormal left ventricular filling pattern,    with concomitant abnormal relaxation and increased filling    pressure (grade 2 diastolic dysfunction).  - Aortic valve: Mildly calcified annulus. There was mild stenosis.    Valve area (VTI): 1.52 cm^2. Valve area (Vmax): 1.54 cm^2. Valve    area (Vmean): 1.65 cm^2.  - Mitral valve: There was mild regurgitation.   Recent Labs: 10/20/2021: BUN 17; Creatinine, Ser 1.17; Potassium 4.0; Sodium 140    Lipid Panel    Component Value Date/Time   CHOL 140 10/20/2021 1216   TRIG 75 10/20/2021 1216   HDL 70 10/20/2021 1216   CHOLHDL 2.0 10/20/2021 1216   CHOLHDL 1.8 12/02/2014 0952   VLDL 12 12/02/2014 0952   LDLCALC 55 10/20/2021 1216   LDLDIRECT 62 07/28/2012 0927      Wt Readings from Last 3 Encounters:  12/27/21 99 lb 11.2 oz (45.2 kg)  10/20/21 104 lb (47.2 kg)  09/21/21 107 lb 3.2 oz (48.6 kg)     ASSESSMENT AND PLAN:  No problem-specific Assessment & Plan notes found for this  encounter.  # Hypertension: - Blood pressure well-controlled on Carvedilol, Lisinopril, and Amlodipine. - Continue current medications and monitor blood pressure regularly. - Encourage patient to maintain an active lifestyle and healthy diet.  # Diabetes mellitus: - Patient on Trulicity injection, Jardiance  and Metformin for high blood sugar. - Weight loss noted; advised to consume protein shakes to regain weight. - Continue to monitor A1C levels and adjust treatment as necessary.  # Hyperlipidemia: - Cholesterol levels well-controlled on rosuvastatin. - Continue to monitor lipid profile and maintain a heart-healthy diet.  # Bradycardia:  Asymptomatic. She is tolerating carvedilol well.    Medication Adjustments/Labs and Tests Ordered:  Current medicines are reviewed at length with the patient today.  The patient does not have concerns regarding medicines.  The following changes have been made:  no change  Labs/ tests ordered today include:   No orders of the defined types were placed in this encounter.    Disposition: FU with Tiffany C. Oval Linsey, MD, [facility] in 1 year.   I,Breanna Adamick,acting as a scribe for Skeet Latch, MD.,have documented all relevant documentation on the behalf of Skeet Latch, MD,as directed by  Skeet Latch, MD while in the presence of Skeet Latch, MD.   I, [place] Oval Linsey, MD have reviewed all documentation for this visit.  The documentation of the exam, diagnosis, procedures, and orders on 12/27/2021 are all accurate and complete.   Signed, Tiffany C. Oval Linsey, MD, [facility]  12/27/2021 1:45 PM    [place]

## 2021-12-27 NOTE — Patient Instructions (Addendum)
Medication Instructions:  Your physician recommends that you continue on your current medications as directed. Please refer to the Current Medication list given to you today.  *If you need a refill on your cardiac medications before your next appointment, please call your pharmacy*  Lab Work: NONE  Testing/Procedures: NONE  Follow-Up: At [facility], you and your health needs are our priority.  As part of our continuing mission to provide you with exceptional heart care, we have created designated Provider Care Teams.  These Care Teams include your primary Cardiologist (physician) and Advanced Practice Providers (APPs -  Physician Assistants and Nurse Practitioners) who all work together to provide you with the care you need, when you need it.  We recommend signing up for the patient portal called "MyChart".  Sign up information is provided on this After Visit Summary.  MyChart is used to connect with patients for Virtual Visits (Telemedicine).  Patients are able to view lab/test results, encounter notes, upcoming appointments, etc.  Non-urgent messages can be sent to your provider as well.   To learn more about what you can do with MyChart, go to [email or web address].    Your next appointment:   12 month(s)  The format for your next appointment:   In Person  Provider:   Tiffany , MD     

## 2021-12-28 ENCOUNTER — Other Ambulatory Visit: Payer: Self-pay

## 2021-12-28 NOTE — Patient Outreach (Signed)
[place] [facility]) Care Management  12/28/2021  Mallory Hayes 03-03-1946 [id number]   Telephone call to patient for follow up plumbing needs. Patient states she has some plumbing issues at her home and she cannot afford it and was looking for assistance.  Discussed with patient care guide referral being done but CM also gave patient information for community housing solutions.  She states she will call them today.  She is appreciative of the information.    Plan: RN CM will outreach in September and patient agreeable.   [name], RN, MSN [facility] Care Management Care Management Coordinator Direct Line [phone] Toll Free: [phone]  Fax: [phone]

## 2021-12-29 ENCOUNTER — Telehealth: Payer: Self-pay | Admitting: *Deleted

## 2021-12-29 NOTE — Telephone Encounter (Signed)
   Telephone encounter was:  Unsuccessful.  12/29/2021 Name: Mallory Hayes MRN: [id number] DOB: Dec 20, 1945  Unsuccessful outbound call made today to assist with:  Home Modifications  Outreach Attempt:  1st Attempt  A HIPAA compliant voice message was left requesting a return call.  Instructed patient to call back at [phone].  [place] [phone] [address] , [place] [address] Email : [email or web address]

## 2022-01-02 ENCOUNTER — Telehealth: Payer: Self-pay | Admitting: *Deleted

## 2022-01-02 NOTE — Telephone Encounter (Signed)
   Telephone encounter was:  Unsuccessful.  01/02/2022 Name: Mallory Hayes MRN: [id number] DOB: Nov 08, 1945  Unsuccessful outbound call made today to assist with:  Home Modifications  Outreach Attempt:  2nd Attempt  A HIPAA compliant voice message was left requesting a return call.  Instructed patient to call back at [phone].  [place] [phone] [address] , [place] [address] Email : [email or web address]

## 2022-01-03 ENCOUNTER — Telehealth: Payer: Self-pay

## 2022-01-03 ENCOUNTER — Telehealth: Payer: Self-pay | Admitting: *Deleted

## 2022-01-03 DIAGNOSIS — E119 Type 2 diabetes mellitus without complications: Secondary | ICD-10-CM

## 2022-01-03 NOTE — Telephone Encounter (Signed)
   Telephone encounter was:  Unsuccessful.  01/03/2022 Name: Mallory Hayes MRN: [id number] DOB: [birth year]  Unsuccessful outbound call made today to assist with:  Food Insecurity  Outreach Attempt:  3rd Attempt.  Referral closed unable to contact patient.  A HIPAA compliant voice message was left requesting a return call.  Instructed patient to call back at [phone]. [place] [phone] [address] , [place] [address] Email : [email or web address]

## 2022-01-03 NOTE — Patient Outreach (Signed)
  Care Coordination   Follow Up Visit Note   01/03/2022 Name: Mallory Hayes MRN: [id number] DOB: 1945-11-05  Mallory Hayes is a 76 y.o. year old female who sees Ardelia Mems, Delorse Limber, MD for primary care. I spoke with  [name] by phone today.  What matters to the patients health and wellness today?  Get eye exam.      Goals Addressed             This Visit's Progress    Get eye exam       Care Coordination Interventions: 01/03/22 Provided education to patient about basic DM disease process Patient to have eye exam January 2024         SDOH assessments and interventions completed:  Yes     Care Coordination Interventions Activated:  Yes  Care Coordination Interventions:  Yes, provided   Follow up plan: Follow up call scheduled for October    Encounter Outcome:  Pt. Visit Completed   [name], RN, MSN [place] [facility] Care Management Coordinator Direct Line [phone] Toll Free: [phone]  Fax: [phone]

## 2022-01-04 ENCOUNTER — Telehealth: Payer: Self-pay

## 2022-01-04 NOTE — Telephone Encounter (Signed)
   Telephone encounter was:  Successful.  01/04/2022 Name: Mallory Hayes MRN: [id number] DOB: 13-Feb-1946  Mallory Hayes is a 76 y.o. year old female who is a primary care patient of Ardelia Mems Delorse Limber, MD . The community resource team was consulted for assistance with [place] guide performed the following interventions: Patient provided with information about care guide support team and interviewed to confirm resource needs. Patient advised she is needing food resources at this time as she does not drive and cannot get out much. Patient advised she would like Meals on wheels and food resources for a month currently. CG discussed MOWs and [facility] meals with patient. CG provided Mallory Hayes at [employer] patient's information and patient understood there is about a one year waiting list. CG sent a referral for [facility] meals for a month per patients request. CG provided patient with the callback number if further assistance was needed after a month.  Follow Up Plan:  No further follow up planned at this time. The patient has been provided with needed resources.  [place] [facility] management  [place], [place]  Main Phone: [phone]  E-mail: [email or web address]  Website: [email or web address]

## 2022-01-11 ENCOUNTER — Ambulatory Visit (INDEPENDENT_AMBULATORY_CARE_PROVIDER_SITE_OTHER): Payer: Medicare HMO | Admitting: Family Medicine

## 2022-01-11 VITALS — BP 160/70 | HR 52 | Ht 61.0 in | Wt 103.4 lb

## 2022-01-11 DIAGNOSIS — E1169 Type 2 diabetes mellitus with other specified complication: Secondary | ICD-10-CM | POA: Diagnosis not present

## 2022-01-11 DIAGNOSIS — I10 Essential (primary) hypertension: Secondary | ICD-10-CM | POA: Diagnosis not present

## 2022-01-11 LAB — POCT GLYCOSYLATED HEMOGLOBIN (HGB A1C): HbA1c, POC (controlled diabetic range): 7.6 % — AB (ref 0.0–7.0)

## 2022-01-11 MED ORDER — TETANUS-DIPHTH-ACELL PERTUSSIS 5-2.5-18.5 LF-MCG/0.5 IM SUSY: 0.5000 mL | PREFILLED_SYRINGE | Freq: Once | INTRAMUSCULAR | 0 refills | Status: AC

## 2022-01-11 NOTE — Progress Notes (Unsigned)
  Date of Visit: 01/11/2022   HPI:  Mallory Hayes is a 76 y.o. here for f/u:  Diabetes Patient does not check her sugars at home. She takes Metformin XR '500mg'$  daily, Trulicity 1.'5mg'$  weekly, and Jardiance '10mg'$  daily. She has not taken her Trulicity singe August due to changes in insurance coverage. [employer] is in the process of helping her with affording medications. She is worried about restarting Trulicity despite the financial barriers due to weight loss. She lost about 5-7lbs but has since regained it. She is not interested in increasing Metformin due to some side effects she heard about from her friends and family including neuropathy and kidney disease. She is physically active with walking, yardwork, and gardening. She eats 3 meals a day and limits fried foods and sugary beverages.  Hypertension Patient is not checking her blood pressures at home. She is taking Amlodipine '10mg'$  and Lisinopril '40mg'$  daily. She is taking Coreg 3.'125mg'$  daily at 4pm instead of BID because of concerns about driving while on the medication. She has not experienced any side effects such as dizziness. She denies LE edema, chest pain, and headaches.  Callous She reports a callous on her right palm x1 year. She gardens and uses a rake.   PHYSICAL EXAM: BP (!) 160/70   Pulse (!) 52   Ht '5\' 1"'$  (1.549 m)   Wt 103 lb 6.4 oz (46.9 kg)   SpO2 99%   BMI 19.54 kg/m  Gen: Well appearing in no distress CV: Normal rate. No LE edema. Normal S1 and S2. No murmurs. Pulm: Normal respiratory effort. No wheezing or crackles. Skin: 2cm callous noted on right palm Neuro: alert and oriented. Normal strength of upper and lower extremities. Normal grip strength.  ASSESSMENT/PLAN:  Essential hypertension Blood pressure is not at goal: 181/60 and 160/70 on recheck. Addressed patient's concerns about Coreg. Start taking Coreg 3.'125mg'$  BID as prescribed. Continue Amlodipine '10mg'$  and Lisinopril '10mg'$  daily. Return in 1 week for a blood  pressure check.  Type 2 diabetes mellitus (HCC) A1C today is 7.6. Improved from 8.5 in June 2023. Continue Jardiance '10mg'$  daily and Metformin '500mg'$  daily. Discussed patient's concerns about Metformin side effects. Stop Trulicity 1.'5mg'$  weekly given side effects and affordability. Follow-up in 3 months for diabetes.  Callous Reassured this is normal and not concerning. Continue to wear gloves while doing yardwork. Try Amlactin for the callous.  HM Patient plans to get Tdap, Shingles, and flu vaccine. -given rx to get Tdap at her pharmacy - eye exam scheduled January 2024 with [facility]  Follow-up Return in 1 week for RN blood pressure recheck and 3 months with me for f/u diabetes.  Leonette Nutting, [facility]  Patient seen along with MS3 student Leonette Nutting. I personally evaluated this patient along with the student, and verified all aspects of the history, physical exam, and medical decision making as documented by the student. I agree with the student's documentation and have made all necessary edits.  Chrisandra Netters, MD  [place]

## 2022-01-11 NOTE — Assessment & Plan Note (Signed)
A1C today is 7.6. Improved from 8.5 in June 2023. Continue Jardiance '10mg'$  daily and Metformin '500mg'$  daily. Discussed patient's concerns about Metformin side effects. Stop Trulicity 1.'5mg'$  weekly given side effects and affordability. Follow-up in 3 months for diabetes.

## 2022-01-11 NOTE — Patient Instructions (Addendum)
It was great to see you again today!  Come back next week to recheck blood pressure  Take carvedilol twice daily  Follow up with me in 3 months  Take tetanus vaccine prescription to your pharmacy  Be well, Dr. Ardelia Mems

## 2022-01-11 NOTE — Assessment & Plan Note (Signed)
Blood pressure is not at goal: 181/60 and 160/70 on recheck. Addressed patient's concerns about Coreg. Start taking Coreg 3.'125mg'$  BID as prescribed. Continue Amlodipine '10mg'$  and Lisinopril '10mg'$  daily. Return in 1 week for a blood pressure check.

## 2022-01-16 ENCOUNTER — Other Ambulatory Visit: Payer: Self-pay | Admitting: Family Medicine

## 2022-01-18 ENCOUNTER — Ambulatory Visit: Payer: Self-pay

## 2022-01-23 ENCOUNTER — Ambulatory Visit: Payer: Medicare HMO

## 2022-01-25 ENCOUNTER — Other Ambulatory Visit: Payer: Self-pay | Admitting: Family Medicine

## 2022-01-30 ENCOUNTER — Ambulatory Visit: Payer: Medicare HMO

## 2022-01-30 ENCOUNTER — Ambulatory Visit: Payer: Self-pay

## 2022-01-30 VITALS — BP 126/66 | HR 58 | Wt 103.0 lb

## 2022-01-30 DIAGNOSIS — Z0130 Encounter for examination of blood pressure without abnormal findings: Secondary | ICD-10-CM

## 2022-01-30 NOTE — Progress Notes (Signed)
Blood pressure now at goal, no changes [name], MD

## 2022-01-30 NOTE — Progress Notes (Signed)
Patient here today for BP check.      Last BP was on 01/11/2022 and 160/70.  BP today is 126/66 with a pulse of 58.    Checked BP in left arm with regular cuff.    Symptoms present: None.   Patient reports she has taken Amlodipine, Lisinopril and Coreg x1. Patient reports she plans to take second Coreg later this afternoon.   Patient reports she has been checking her blood pressures at home and they have been "normal."   Patient declined flu shot today, however requested to schedule on 10/10 to receive. Will check BP again at that visit.   Will forward to PCP.

## 2022-01-30 NOTE — Patient Instructions (Signed)
Visit Information  Thank you for taking time to visit with me today. Please don't hesitate to contact me if I can be of assistance to you.   Following are the goals we discussed today:   Goals Addressed             This Visit's Progress    Management Diabetes and Blood Pressure       Care Coordination Interventions: Provided education to patient about basic DM disease process Reviewed medications with patient and discussed importance of medication adherence Evaluation of current treatment plan related to hypertension self management and patient's adherence to plan as established by provider Provided education to patient re: stroke prevention, s/s of heart attack and stroke Reviewed medications with patient and discussed importance of compliance   Care Coordination Interventions: 01/03/22 Provided education to patient about basic DM disease process Patient to have eye exam January 2024         Our next appointment is by telephone on November 1st at 10 am  Please call the care guide team at [phone] if you need to cancel or reschedule your appointment.   If you are experiencing a Mental Health or [place] or need someone to talk to, please call the Suicide and Crisis Lifeline: 988   The patient verbalized understanding of instructions, educational materials, and care plan provided today and agreed to receive a mailed copy of patient instructions, educational materials, and care plan.   Telephone follow up appointment with care management team member scheduled for: November 1st  Janyiah Silveri Durwin Reges, [place], MSN [facility] Care Management Coordinator Direct Line [phone]

## 2022-01-30 NOTE — Patient Outreach (Addendum)
  Care Coordination   Initial Visit Note   01/30/2022 Name: Marline Morace MRN: [id number] DOB: 07/05/1945  Arnola Crittendon is a 76 y.o. year old female who sees Ardelia Mems, Delorse Limber, MD for primary care. I spoke with  [name] by phone today.  What matters to the patients health and wellness today?  Blood pressure management    Goals Addressed             This Visit's Progress    Management Diabetes and Blood Pressure       Care Coordination Interventions: Provided education to patient about basic DM disease process Reviewed medications with patient and discussed importance of medication adherence Evaluation of current treatment plan related to hypertension self management and patient's adherence to plan as established by provider Provided education to patient re: stroke prevention, s/s of heart attack and stroke Reviewed medications with patient and discussed importance of compliance   Care Coordination Interventions: 01/03/22 Provided education to patient about basic DM disease process Patient to have eye exam January 2024         SDOH assessments and interventions completed:  Yes     Care Coordination Interventions Activated:  Yes  Care Coordination Interventions:  Yes, provided   Follow up plan: Follow up call scheduled for November    Encounter Outcome:  Pt. Visit Completed

## 2022-01-31 ENCOUNTER — Telehealth: Payer: Self-pay

## 2022-01-31 ENCOUNTER — Ambulatory Visit: Payer: Medicare HMO | Admitting: Dietician

## 2022-02-06 ENCOUNTER — Ambulatory Visit: Payer: Medicare HMO | Admitting: Obstetrics & Gynecology

## 2022-02-06 ENCOUNTER — Ambulatory Visit: Payer: Medicare HMO

## 2022-02-13 ENCOUNTER — Ambulatory Visit (INDEPENDENT_AMBULATORY_CARE_PROVIDER_SITE_OTHER): Payer: Medicare HMO

## 2022-02-13 DIAGNOSIS — Z23 Encounter for immunization: Secondary | ICD-10-CM | POA: Diagnosis not present

## 2022-02-13 NOTE — Progress Notes (Signed)
Patient presents to nurse clinic for flu vaccination. Offered high dose flu vaccine. Patient declines and requests to have regular vaccine.   Administered regular flu vaccination to patient in RD, site unremarkable, tolerated injection well.   Patient has questions on if she should get the RSV vaccine from pharmacy. Advised that I would send message to her provider and someone will let her know provider recommendations.   Talbot Grumbling, RN

## 2022-02-28 ENCOUNTER — Ambulatory Visit: Payer: Self-pay

## 2022-02-28 NOTE — Patient Instructions (Signed)
Visit Information  Thank you for taking time to visit with me today. Please don't hesitate to contact me if I can be of assistance to you.   Following are the goals we discussed today:   Goals Addressed             This Visit's Progress    Management Diabetes and Blood Pressure       Care Coordination Interventions: Provided education to patient about basic DM disease process Reviewed medications with patient and discussed importance of medication adherence Evaluation of current treatment plan related to hypertension self management and patient's adherence to plan as established by provider Reviewed medications with patient and discussed importance of compliance       Patient to have eye exam January 2024           Our next appointment is by telephone on 04/02/22 at 1000 am  Please call the care guide team at [phone] if you need to cancel or reschedule your appointment.   If you are experiencing a Mental Health or [place] or need someone to talk to, please call the Suicide and Crisis Lifeline: 988   The patient verbalized understanding of instructions, educational materials, and care plan provided today and agreed to receive a mailed copy of patient instructions, educational materials, and care plan.   Telephone follow up appointment with care management team member scheduled for: December  Ayesha Markwell J Bret Vanessen, [place], MSN [facility] Care Management Coordinator Direct Line [phone]

## 2022-02-28 NOTE — Patient Outreach (Addendum)
  Care Coordination   Follow Up Visit Note   02/28/2022 Name: Mallory Hayes MRN: [id number] DOB: Dec 10, 1945  Mallory Hayes is a 76 y.o. year old female who sees Ardelia Mems, Delorse Limber, MD for primary care. I spoke with  [name] by phone today.  What matters to the patients health and wellness today?  DM and HTN Management    Goals Addressed             This Visit's Progress    Management Diabetes and Blood Pressure       Care Coordination Interventions: Provided education to patient about basic DM disease process Reviewed medications with patient and discussed importance of medication adherence Evaluation of current treatment plan related to hypertension self management and patient's adherence to plan as established by provider Reviewed medications with patient and discussed importance of compliance       Patient to have eye exam January 2024           SDOH assessments and interventions completed:  Yes     Care Coordination Interventions Activated:  Yes  Care Coordination Interventions:  Yes, provided   Follow up plan: Follow up call scheduled for December    Encounter Outcome:  Pt. Visit Completed   [name], RN, MSN [place] [facility] Care Management Coordinator Direct Line [phone]

## 2022-03-01 ENCOUNTER — Ambulatory Visit: Payer: Medicare HMO | Admitting: Obstetrics & Gynecology

## 2022-03-02 ENCOUNTER — Telehealth: Payer: Self-pay

## 2022-03-02 ENCOUNTER — Ambulatory Visit (INDEPENDENT_AMBULATORY_CARE_PROVIDER_SITE_OTHER): Payer: Medicare HMO | Admitting: Family Medicine

## 2022-03-02 VITALS — BP 132/68 | HR 50 | Wt 107.2 lb

## 2022-03-02 DIAGNOSIS — R0981 Nasal congestion: Secondary | ICD-10-CM | POA: Diagnosis not present

## 2022-03-02 MED ORDER — CETIRIZINE HCL 10 MG PO TABS: 10.0000 mg | ORAL_TABLET | Freq: Every day | ORAL | 0 refills | Status: DC

## 2022-03-02 NOTE — Telephone Encounter (Signed)
Patient calls nurse line regarding nasal congestion. She also reports a dry cough. Believes she may have slight fever. Duration of symptoms: 3 days  Denies sore throat and body aches. She has not taken home COVID tests. She received flu and COVID vaccine at the end of October.   She is requesting that medication be sent to pharmacy for symptoms. Recommended that patient schedule appointment for evaluation.   Scheduled for this AM with Dr. Thompson Grayer.   Talbot Grumbling, RN

## 2022-03-02 NOTE — Progress Notes (Signed)
    SUBJECTIVE:   CHIEF COMPLAINT / HPI:   Nasal congestion, clear rhinorrhea, dry cough for 3 days.  Raked yard on Monday, this started Tuesday. Using Flonase and nasal decongestant, worse at night but present through the day No fevers, chills, nausea, vomiting, muscle aches, chest pain, SOA. No sputum production. Had bad allergies when younger and had to do allergy shots Got COVID shot 10/22 and flu vaccine as well.  Not on home oxygen.   PERTINENT  PMH / PSH: T2DM, HTN, OSA, diastolic CHF  OBJECTIVE:   BP 132/68   Pulse (!) 50   Wt 107 lb 3.2 oz (48.6 kg)   SpO2 98%   BMI 20.26 kg/m   General: A&O, NAD HEENT: No sign of trauma, EOM grossly intact, anres with  Cardiac: RRR, no m/r/g Respiratory: CTAB, normal WOB, no w/c/r GI: Soft, NTTP, non-distended  Extremities: NTTP, no peripheral edema. Neuro: Normal gait, moves all four extremities appropriately. Psych: Appropriate mood and affect   ASSESSMENT/PLAN:   Nasal congestion Suspect most likely allergies after yard work due to lack of other systemic symptoms Zyrtec daily, continue flonase COVID swab done today, will call with results, ED/return precautions discussed     Lenoria Chime, [place]

## 2022-03-02 NOTE — Assessment & Plan Note (Signed)
Suspect most likely allergies after yard work due to lack of other systemic symptoms Zyrtec daily, continue flonase COVID swab done today, will call with results, ED/return precautions discussed

## 2022-03-02 NOTE — Patient Instructions (Addendum)
It was wonderful to see you today.  Please bring ALL of your medications with you to every visit.   Today we talked about:  For your cough and congestion- we have tested you for COVID just in case. I still think this is likely allergies, keep doing the flonase and I have sent Zyrtec '10mg'$  daily to your pharmacy to start.  If you get chest pain or shortness of breath, fevers or chills, or coughing up sputum or blood, please give [place] a call immediately to be seen or go to ED.  Thank you for choosing [facility].   Please call [phone] with any questions about today's appointment.  Please arrive at least 15 minutes prior to your scheduled appointments.   If you had blood work today, I will send you a MyChart message or a letter if results are normal. Otherwise, I will give you a call.   If you had a referral placed, they will call you to set up an appointment. Please give [place] a call if you don't hear back in the next 2 weeks.   If you need additional refills before your next appointment, please call your pharmacy first.   Yehuda Savannah, MD  Family Medicine

## 2022-03-04 LAB — NOVEL CORONAVIRUS, NAA: SARS-CoV-2, NAA: NOT DETECTED

## 2022-03-06 DIAGNOSIS — E119 Type 2 diabetes mellitus without complications: Secondary | ICD-10-CM | POA: Diagnosis not present

## 2022-03-06 DIAGNOSIS — H524 Presbyopia: Secondary | ICD-10-CM | POA: Diagnosis not present

## 2022-03-06 DIAGNOSIS — H43811 Vitreous degeneration, right eye: Secondary | ICD-10-CM | POA: Diagnosis not present

## 2022-03-06 LAB — HM DIABETES EYE EXAM

## 2022-03-12 ENCOUNTER — Other Ambulatory Visit: Payer: Self-pay | Admitting: Family Medicine

## 2022-03-12 ENCOUNTER — Ambulatory Visit: Payer: Medicare HMO | Admitting: Dietician

## 2022-03-14 ENCOUNTER — Ambulatory Visit: Payer: Medicare HMO | Admitting: Obstetrics & Gynecology

## 2022-03-14 DIAGNOSIS — Z0289 Encounter for other administrative examinations: Secondary | ICD-10-CM

## 2022-03-16 ENCOUNTER — Encounter: Payer: Self-pay | Admitting: Family Medicine

## 2022-03-21 ENCOUNTER — Encounter: Payer: Self-pay | Admitting: Podiatry

## 2022-03-21 ENCOUNTER — Ambulatory Visit (INDEPENDENT_AMBULATORY_CARE_PROVIDER_SITE_OTHER): Payer: Medicare HMO | Admitting: Podiatry

## 2022-03-21 DIAGNOSIS — E1169 Type 2 diabetes mellitus with other specified complication: Secondary | ICD-10-CM

## 2022-03-21 DIAGNOSIS — M79674 Pain in right toe(s): Secondary | ICD-10-CM | POA: Diagnosis not present

## 2022-03-21 DIAGNOSIS — N183 Chronic kidney disease, stage 3 unspecified: Secondary | ICD-10-CM

## 2022-03-21 DIAGNOSIS — M79675 Pain in left toe(s): Secondary | ICD-10-CM

## 2022-03-21 DIAGNOSIS — B351 Tinea unguium: Secondary | ICD-10-CM | POA: Diagnosis not present

## 2022-03-21 NOTE — Progress Notes (Signed)
This patient returns to my office for at risk foot care.  This patient requires this care by a professional since this patient will be at risk due to having CKD stage 3 and type 2 diabetes.   This patient is unable to cut nails herself since the patient cannot reach her nails.These nails are painful walking and wearing shoes.  This patient presents for at risk foot care today.  General Appearance  Alert, conversant and in no acute stress.  Vascular  Dorsalis pedis and posterior tibial  pulses are palpable  bilaterally.  Capillary return is within normal limits  bilaterally. Temperature is within normal limits  bilaterally.  Neurologic  Senn-Weinstein monofilament wire test within normal limits  bilaterally. Muscle power within normal limits bilaterally.  Nails Thick disfigured discolored nails with subungual debris  from hallux to fifth toes bilaterally. No evidence of bacterial infection or drainage bilaterally.  Orthopedic  No limitations of motion  feet .  No crepitus or effusions noted.  No bony pathology or digital deformities noted.  HAV  B/L and tailors bunion  B/L.  Skin  normotropic skin with no porokeratosis noted bilaterally.  No signs of infections or ulcers noted.     Onychomycosis  Pain in right toes  Pain in left toes  Consent was obtained for treatment procedures.   Mechanical debridement of nails 1-5  bilaterally performed with a nail nipper.  Filed with dremel without incident.    Return office visit     3 months                 Told patient to return for periodic foot care and evaluation due to potential at risk complications.   Angelamarie Avakian DPM  

## 2022-03-25 ENCOUNTER — Other Ambulatory Visit: Payer: Self-pay | Admitting: Family Medicine

## 2022-03-28 ENCOUNTER — Other Ambulatory Visit (HOSPITAL_COMMUNITY): Payer: Self-pay

## 2022-03-28 ENCOUNTER — Telehealth: Payer: Self-pay

## 2022-03-28 NOTE — Telephone Encounter (Signed)
Received notification from [place] [employer]) regarding approval for [employer]. Patient assistance approved from 04/30/22 to 04/30/23.  Phone: [phone]

## 2022-04-02 ENCOUNTER — Ambulatory Visit: Payer: Self-pay

## 2022-04-02 NOTE — Patient Outreach (Signed)
  Care Coordination   04/02/2022 Name: Mallory Hayes MRN: [id number] DOB: 1946/01/31   Care Coordination Outreach Attempts:  An unsuccessful telephone outreach was attempted today to offer the patient information about available care coordination services as a benefit of their health plan.   Follow Up Plan:  Additional outreach attempts will be made to offer the patient care coordination information and services.   Encounter Outcome:  No Answer   Care Coordination Interventions:  No, not indicated    [name], RN, MSN [facility] Management Care Management Coordinator Direct Line [phone]

## 2022-04-05 ENCOUNTER — Other Ambulatory Visit: Payer: Self-pay | Admitting: Family Medicine

## 2022-04-09 ENCOUNTER — Telehealth: Payer: Self-pay | Admitting: *Deleted

## 2022-04-09 NOTE — Telephone Encounter (Signed)
   Telephone encounter was:  Successful.  04/09/2022 Name: Mallory Hayes MRN: [id number] DOB: October 25, 1945  Mallory Hayes is a 76 y.o. year old female who is a primary care patient of Ardelia Mems Delorse Limber, MD . The community resource team was consulted for assistance with [place] guide performed the following interventions: Patient provided with information about care guide support team and interviewed to confirm resource needs. Patient needed moms meals updated was extended  Follow Up Plan:  No further follow up planned at this time. The patient has been provided with needed resources.  [place] [phone] [address] , [place] [address] Email : [email or web address]

## 2022-04-11 ENCOUNTER — Encounter: Payer: Medicare HMO | Admitting: Registered"

## 2022-05-07 ENCOUNTER — Ambulatory Visit: Payer: Self-pay

## 2022-05-07 NOTE — Patient Outreach (Signed)
  Care Coordination   05/07/2022 Name: Mallory Hayes MRN: [id number] DOB: [birth year]   Care Coordination Outreach Attempts:  An unsuccessful telephone outreach was attempted today to offer the patient information about available care coordination services as a benefit of their health plan.   Follow Up Plan:  Additional outreach attempts will be made to offer the patient care coordination information and services.   Encounter Outcome:  No Answer   Care Coordination Interventions:  No, not indicated    [name], RN, MSN [facility] Management Care Management Coordinator Direct Line [phone]

## 2022-05-18 ENCOUNTER — Telehealth: Payer: Self-pay | Admitting: *Deleted

## 2022-05-18 NOTE — Telephone Encounter (Signed)
   Telephone encounter was:  Successful.  05/18/2022 Name: Mallory Hayes MRN: [id number] DOB: May 04, 1945  Mallory Hayes is a 77 y.o. year old female who is a primary care patient of Ardelia Mems Delorse Limber, MD . The community resource team was consulted for assistance with Food Insecurity patient called back moms meals has stopped will renew her application as she is on the waitlist for  [place] guide performed the following interventions: Patient provided with information about care guide support team and interviewed to confirm resource needs.  Follow Up Plan:  No further follow up planned at this time. The patient has been provided with needed resources.  [place] [facility] [phone] [address] , [place] [address] Email : [email or web address]

## 2022-05-24 ENCOUNTER — Encounter: Payer: Medicare HMO | Admitting: Dietician

## 2022-06-05 ENCOUNTER — Ambulatory Visit: Payer: Self-pay

## 2022-06-05 NOTE — Patient Instructions (Signed)
Visit Information  Thank you for taking time to visit with me today. Please don't hesitate to contact me if I can be of assistance to you.   Following are the goals we discussed today:   Goals Addressed             This Visit's Progress    Management Diabetes and Blood Pressure       Care Coordination Interventions: Provided education to patient about basic DM disease process Evaluation of current treatment plan related to hypertension self management and patient's adherence to plan as established by provider    Interventions Today    Flowsheet Row Most Recent Value  Chronic Disease Discussed/Reviewed   Chronic disease discussed/reviewed during today's visit Diabetes  General Interventions   General Interventions Discussed/Reviewed General Interventions Discussed, Annual Eye Exam, Doctor Visits  Doctor Visits Discussed/Reviewed Annual Wellness Visits, Doctor Visits Discussed  Education Interventions   Education Provided Provided Verbal Education  Provided Verbal Education On Blood Sugar Monitoring, Eye Care, Exercise                 Our next appointment is by telephone on 07/31/22 at 1000  Please call the care guide team at [phone] if you need to cancel or reschedule your appointment.   If you are experiencing a Mental Health or [place] or need someone to talk to, please call the Suicide and Crisis Lifeline: 988   The patient verbalized understanding of instructions, educational materials, and care plan provided today and agreed to receive a mailed copy of patient instructions, educational materials, and care plan.   Telephone follow up appointment with care management team member scheduled for: April  Lariah Fleer J Donaldo Teegarden, [place], MSN [facility] Management Care Management Coordinator Direct Line [phone]

## 2022-06-05 NOTE — Patient Outreach (Signed)
  Care Coordination   Follow Up Visit Note   06/05/2022 Name: Mallory Hayes MRN: [id number] DOB: Jun 08, 1945  Mallory Hayes is a 77 y.o. year old female who sees Ardelia Mems, Delorse Limber, MD for primary care. I spoke with  [name] by phone today.  What matters to the patients health and wellness today? Maintaining health   Goals Addressed             This Visit's Progress    Management Diabetes and Blood Pressure       Care Coordination Interventions: Provided education to patient about basic DM disease process Evaluation of current treatment plan related to hypertension self management and patient's adherence to plan as established by provider    Interventions Today    Flowsheet Row Most Recent Value  Chronic Disease Discussed/Reviewed   Chronic disease discussed/reviewed during today's visit Diabetes  General Interventions   General Interventions Discussed/Reviewed General Interventions Discussed, Annual Eye Exam, Doctor Visits  Doctor Visits Discussed/Reviewed Annual Wellness Visits, Doctor Visits Discussed  Education Interventions   Education Provided Provided Verbal Education  Provided Verbal Education On Blood Sugar Monitoring, Eye Care, Exercise                 SDOH assessments and interventions completed:  Yes  SDOH Interventions Today    Flowsheet Row Most Recent Value  SDOH Interventions   Food Insecurity Interventions Other (Comment)  [Meals on wheels waiting list]        Care Coordination Interventions:  Yes, provided   Follow up plan: Follow up call scheduled for April    Encounter Outcome:  Pt. Visit Completed   [name], RN, MSN [facility] Care Management Coordinator Direct Line [phone]

## 2022-06-07 MED ORDER — EMPAGLIFLOZIN 10 MG PO TABS: 10.0000 mg | ORAL_TABLET | Freq: Every day | ORAL | 3 refills | Status: DC

## 2022-06-07 NOTE — Addendum Note (Signed)
Addended by: [name] on: 06/07/2022 12:29 PM   Modules accepted: Orders

## 2022-06-07 NOTE — Telephone Encounter (Signed)
Sent in rx. [place] office, can you get patient scheduled for a follow up visit with me?  Thanks! [name], MD

## 2022-06-07 NOTE — Telephone Encounter (Signed)
Rec'd refill request for patients Jardiance medication.   Could new rx (90 day supply) be sent to [facility]? Added pharmacy to patient preferred pharmacies.

## 2022-06-08 NOTE — Telephone Encounter (Signed)
Called and lvm for patient to call back and schedule appointment.

## 2022-06-26 ENCOUNTER — Encounter: Payer: Self-pay | Admitting: Podiatry

## 2022-06-26 ENCOUNTER — Ambulatory Visit (INDEPENDENT_AMBULATORY_CARE_PROVIDER_SITE_OTHER): Payer: Medicare HMO | Admitting: Podiatry

## 2022-06-26 DIAGNOSIS — B351 Tinea unguium: Secondary | ICD-10-CM | POA: Diagnosis not present

## 2022-06-26 DIAGNOSIS — M79675 Pain in left toe(s): Secondary | ICD-10-CM | POA: Diagnosis not present

## 2022-06-26 DIAGNOSIS — E1169 Type 2 diabetes mellitus with other specified complication: Secondary | ICD-10-CM | POA: Diagnosis not present

## 2022-06-26 DIAGNOSIS — M79674 Pain in right toe(s): Secondary | ICD-10-CM | POA: Diagnosis not present

## 2022-06-26 NOTE — Progress Notes (Signed)
This patient returns to my office for at risk foot care.  This patient requires this care by a professional since this patient will be at risk due to having CKD stage 3 and type 2 diabetes.   This patient is unable to cut nails herself since the patient cannot reach her nails.These nails are painful walking and wearing shoes.  This patient presents for at risk foot care today.  General Appearance  Alert, conversant and in no acute stress.  Vascular  Dorsalis pedis and posterior tibial  pulses are palpable  bilaterally.  Capillary return is within normal limits  bilaterally. Temperature is within normal limits  bilaterally.  Neurologic  Senn-Weinstein monofilament wire test within normal limits  bilaterally. Muscle power within normal limits bilaterally.  Nails Thick disfigured discolored nails with subungual debris  from hallux to fifth toes bilaterally. No evidence of bacterial infection or drainage bilaterally.  Orthopedic  No limitations of motion  feet .  No crepitus or effusions noted.  No bony pathology or digital deformities noted.  HAV  B/L and tailors bunion  B/L.  Skin  normotropic skin with no porokeratosis noted bilaterally.  No signs of infections or ulcers noted.     Onychomycosis  Pain in right toes  Pain in left toes  Consent was obtained for treatment procedures.   Mechanical debridement of nails 1-5  bilaterally performed with a nail nipper.  Filed with dremel without incident.    Return office visit     3 months                 Told patient to return for periodic foot care and evaluation due to potential at risk complications.   Gardiner Barefoot DPM

## 2022-06-28 ENCOUNTER — Ambulatory Visit: Payer: Medicare HMO | Admitting: Family Medicine

## 2022-06-30 NOTE — Progress Notes (Unsigned)
I connected with  Mallory Hayes on 07/03/2022 by a audio enabled telemedicine application and verified that I am speaking with the correct person using two identifiers.  Patient Location: Home  Provider Location: Home Office  I discussed the limitations of evaluation and management by telemedicine. The patient expressed understanding and agreed to proceed.  Subjective:   Mallory Hayes is a 77 y.o. female who presents for Medicare Annual (Subsequent) preventive examination.  Review of Systems    Per HPI unless specifically indicated below. Cardiac Risk Factors include: advanced age (>42mn, >>57women);female gender,Hypertensive, and Hyperlipidemia.           Objective:       03/02/2022   11:35 AM 01/30/2022    4:47 PM 01/11/2022   12:35 PM  Vitals with BMI  Weight 107 lbs 3 oz 103 lbs   BMI  [id number]  Systolic [id number] Diastolic 68 66 70  Pulse 50 58     There were no vitals filed for this visit. There is no height or weight on file to calculate BMI.     07/03/2022   12:07 PM 03/02/2022   11:34 AM 01/11/2022   11:44 AM 10/20/2021   11:40 AM 09/05/2021    9:56 AM 07/14/2021    1:50 PM 05/10/2021    1:58 PM  Advanced Directives  Does Patient Have a Medical Advance Directive? No No No No No No No  Would patient like information on creating a medical advance directive? No - Patient declined No - Patient declined No - Patient declined No - Patient declined No - Patient declined No - Patient declined No - Patient declined    Current Medications (verified) Outpatient Encounter Medications as of 07/03/2022  Medication Sig   amLODipine (NORVASC) 10 MG tablet TAKE 1 TABLET BY MOUTH EVERY DAY   aspirin 81 MG tablet Take 81 mg by mouth daily.   Blood Glucose Monitoring Suppl (TRUE METRIX AIR GLUCOSE METER) w/Device KIT USE AS DIRECTED   Blood Pressure Monitoring (BLOOD PRESSURE MONITOR AUTOMAT) DEVI Check blood pressure 3 times per week   Calcium Carbonate Antacid (TUMS PO) Take 1  tablet by mouth daily as needed.   cetirizine (ZYRTEC) 10 MG tablet TAKE 1 TABLET BY MOUTH EVERY DAY   Continuous Blood Gluc Sensor (FREESTYLE LIBRE 2 SENSOR) MISC 1 each by Does not apply route every 14 (fourteen) days.   empagliflozin (JARDIANCE) 10 MG TABS tablet Take 1 tablet (10 mg total) by mouth daily.   fluticasone (FLONASE) 50 MCG/ACT nasal spray USE 2 SPRAYS IN EACH NOSTRIL EVERY DAY   lisinopril (ZESTRIL) 40 MG tablet Take 40 mg by mouth daily.   metFORMIN (GLUCOPHAGE-XR) 500 MG 24 hr tablet TAKE 1 TABLET EVERY DAY WITH BREAKFAST   Polyethylene Glycol 3350 (PEG 3350) 17 GM/SCOOP POWD Take 17g by mouth daily prn   rosuvastatin (CRESTOR) 20 MG tablet TAKE 1 TABLET EVERY DAY   TRUEplus Lancets 33G MISC Use to check blood sugar once daily   vitamin E 200 UNIT capsule Take by mouth daily.   carvedilol (COREG) 3.125 MG tablet TAKE 1 TABLET BY MOUTH TWICE A DAY WITH FOOD (Patient not taking: Reported on 07/03/2022)   Facility-Administered Encounter Medications as of 07/03/2022  Medication   0.9 %  sodium chloride infusion    Allergies (verified) Amaryl [glimepiride], Food, and Sulfamethoxazole   History: Past Medical History:  Diagnosis Date   Diabetes mellitus without complication (HCC)    GERD (gastroesophageal reflux  disease)    Hyperlipidemia    on meds   Hypertension    OSA (obstructive sleep apnea)    no cpap   Pure hypercholesterolemia 12/27/2021   Sleep apnea    Past Surgical History:  Procedure Laterality Date   APPENDECTOMY     BREAST EXCISIONAL BIOPSY Right    benign   CHOLECYSTECTOMY     COLONOSCOPY  2011   PARTIAL HYSTERECTOMY  1991   POLYPECTOMY     Family History  Problem Relation Age of Onset   Colon cancer Mother 41   Ovarian cancer Mother    Other Father        brain tumor   Prostate cancer Brother    Hypertension Brother    Diabetes Brother    Thyroid disease Maternal Grandmother    Heart attack Maternal Grandfather    Social History    Socioeconomic History   Marital status: Single    Spouse name: Not on file   Number of children: 0   Years of education: 12   Highest education level: High school graduate  Occupational History   Occupation: Retired  Tobacco Use   Smoking status: Never   Smokeless tobacco: Never  Scientific laboratory technician Use: Never used  Substance and Sexual Activity   Alcohol use: No   Drug use: No   Sexual activity: Not Currently    Partners: Male    Birth control/protection: Surgical    Comment: hysterectomy  Other Topics Concern   Not on file  Social History Narrative   Pt's 46 yo mother died 2010/09/05 of breast cancer. Pt was very close with mother.  Has 2 siblings that live out of town, but has some cousins and friends in the [place] area.   Retired, never married   No h/o tob, EToH, or drug use   Enjoys walking, reading, dining out, and [employer]   Very active in her church    Social Determinants of Health   Financial Resource Strain: [place]  (07/03/2022)   Overall Financial Resource Strain (CARDIA)    Difficulty of Paying Living Expenses: Not hard at all  Food Insecurity: Food Insecurity Present (07/03/2022)   Hunger Vital Sign    Worried About [place] in the Last Year: Sometimes true    Ran Out of Food in the Last Year: Sometimes true  Transportation Needs: No Transportation Needs (07/03/2022)   PRAPARE - Hydrologist (Medical): No    Lack of Transportation (Non-Medical): No  Physical Activity: Sufficiently Active (07/03/2022)   Exercise Vital Sign    Days of Exercise per Week: 7 days    Minutes of Exercise per Session: 30 min  Stress: Stress Concern Present (07/03/2022)   [place]    Feeling of Stress : To some extent  Social Connections: Moderately Integrated (07/03/2022)   Social Connection and Isolation Panel [NHANES]    Frequency of Communication with Friends and  Family: More than three times a week    Frequency of Social Gatherings with Friends and Family: Once a week    Attends Religious Services: More than 4 times per year    Active Member of [employer] or Organizations: Yes    Attends Music therapist: More than 4 times per year    Marital Status: Never married    Tobacco Counseling Counseling given: No   Clinical Intake:  Pre-visit preparation completed: No  Pain : No/denies pain     Nutritional Status: BMI of 19-24  Normal Nutritional Risks: None Diabetes: Yes CBG done?: No Did pt. bring in CBG monitor from home?: No  How often do you need to have someone help you when you read instructions, pamphlets, or other written materials from your doctor or pharmacy?: 1 - Never  Diabetic?Nutrition Risk Assessment:  Has the patient had any N/V/D within the last 2 months?  No  Does the patient have any non-healing wounds?  No  Has the patient had any unintentional weight loss or weight gain?  No   Diabetes:  Is the patient diabetic?  Yes  If diabetic, was a CBG obtained today?  No  Did the patient bring in their glucometer from home?  No  How often do you monitor your CBG's? Never  .   Financial Strains and Diabetes Management:  Are you having any financial strains with the device, your supplies or your medication? No .  Does the patient want to be seen by Chronic Care Management for management of their diabetes?  No  Would the patient like to be referred to a Nutritionist or for Diabetic Management?  No   Diabetic Exams:  Diabetic Eye Exam: Completed 03/06/2022 Diabetic Foot Exam: Completed 05/10/2021   Interpreter Needed?: No  Information entered by :: Donnie Mesa, [place]   Activities of Daily Living    07/03/2022   11:42 AM 09/05/2021    9:56 AM  In your present state of health, do you have any difficulty performing the following activities:  Hearing? 0 0  Vision? 1 0  Comment Dr. Hassell Done   Difficulty  concentrating or making decisions? 0 0  Walking or climbing stairs? 0 0  Dressing or bathing? 0 0  Doing errands, shopping? 0 0  Preparing Food and eating ?  N  Using the Toilet?  N  In the past six months, have you accidently leaked urine?  N  Do you have problems with loss of bowel control?  N  Managing your Medications?  N  Managing your Finances?  N  Housekeeping or managing your Housekeeping?  N    Patient Care Team: [name], MD as PCP - General (Family Medicine) Skeet Latch, MD as PCP - Cardiology (Cardiology) Kennith Center, RD as Dietitian (Family Medicine) Warden Fillers, MD as Consulting Physician (Ophthalmology) Gardiner Barefoot, DPM as Consulting Physician (Podiatry) Jon Billings, RN as [place] any recent Medical Services you may have received from other than [facility] providers in the past year (date may be approximate).     Assessment:   This is a routine wellness examination for Aurelie.   Hearing/Vision screen Denies any hearing issues. Denies any change to her vision. Wear glasses. Annual Eye Exam Warden Fillers, MD.   Dietary issues and exercise activities discussed:     Goals Addressed   None    Depression Screen    07/03/2022   11:41 AM 06/05/2022   10:28 AM 03/02/2022   11:41 AM 03/02/2022   11:34 AM 01/11/2022   11:45 AM 01/03/2022    9:48 AM 10/20/2021   11:40 AM  PHQ 2/9 Scores  PHQ - 2 Score 0 0 0 0 0 0 0  PHQ- 9 Score   '3 3 4      '$ Fall Risk    07/03/2022   11:41 AM 03/02/2022   11:34 AM 01/11/2022   11:46 AM 10/20/2021   11:40 AM  09/05/2021   11:04 AM  Fall Risk   Falls in the past year? 0 0 0 0 0  Number falls in past yr: 0  0  0  Injury with Fall? 0    0  Risk for fall due to : No Fall Risks      Follow up Falls evaluation completed        [place]:  Any stairs in or around the home? No  If so, are there any without handrails? No  Home  free of loose throw rugs in walkways, pet beds, electrical cords, etc? Yes  Adequate lighting in your home to reduce risk of falls? Yes   ASSISTIVE DEVICES UTILIZED TO PREVENT FALLS:  Life alert? No  Use of a cane, walker or w/c? No  Grab bars in the bathroom? Yes  Shower chair or bench in shower? No  Elevated toilet seat or a handicapped toilet? No   TIMED UP AND GO:  Was the test performed? Unable to perform, virtual appointment   Cognitive Function:        07/03/2022   11:42 AM 09/05/2020    4:38 PM  6CIT Screen  What Year? 0 points 0 points  What month? 0 points 0 points  What time? 0 points 0 points  Count back from 20 0 points 0 points  Months in reverse 0 points 0 points  Repeat phrase 0 points 0 points  Total Score 0 points 0 points    Immunizations Immunization History  Administered Date(s) Administered   COVID-19, mRNA, vaccine(Comirnaty)12 years and older 05/21/2021   Fluad Quad(high Dose 65+) 01/19/2019   H1N1 04/28/2008   Influenza Split 02/02/2011, 05/12/2012   Influenza Whole 02/28/2005, 03/06/2007, 04/28/2008, 02/28/2009, 03/01/2010   Influenza,inj,Quad PF,6+ Mos 02/19/2013, 03/10/2014, 01/05/2015, 02/21/2016, 03/29/2017, 05/11/2020, 02/13/2022   Influenza-Unspecified 04/08/2018, 01/10/2021   PFIZER Comirnaty(Gray Top)Covid-19 Tri-Sucrose Vaccine 08/11/2020   PFIZER(Purple Top)SARS-COV-2 Vaccination 07/01/2019, 07/30/2019, 04/06/2020   Pfizer Covid-19 Vaccine Bivalent Booster 89yr & up 02/09/2021   Pneumococcal Conjugate-13 12/02/2014   Pneumococcal Polysaccharide-23 03/06/2007, 11/21/2011   Td 02/28/2005   Td (Adult),5 Lf Tetanus Toxid, Preservative Free 02/28/2005   Unspecified SARS-COV-2 Vaccination 02/18/2022    TDAP status: Due, Education has been provided regarding the importance of this vaccine. Advised may receive this vaccine at local pharmacy or Health Dept. Aware to provide a copy of the vaccination record if obtained from local pharmacy or  Health Dept. Verbalized acceptance and understanding.  Flu Vaccine status: Up to date  Pneumococcal vaccine status: Up to date  Covid-19 vaccine status: Completed vaccines  Qualifies for Shingles Vaccine? Yes   Zostavax completed No   Shingrix Completed?: No.    Education has been provided regarding the importance of this vaccine. Patient has been advised to call insurance company to determine out of pocket expense if they have not yet received this vaccine. Advised may also receive vaccine at local pharmacy or Health Dept. Verbalized acceptance and understanding.  Screening Tests Health Maintenance  Topic Date Due   Diabetic kidney evaluation - Urine ACR  Never done   Zoster Vaccines- Shingrix (1 of 2) Never done   DTaP/Tdap/Td (3 - Tdap) 03/01/2015   FOOT EXAM  05/10/2022   HEMOGLOBIN A1C  07/12/2022   Diabetic kidney evaluation - eGFR measurement  10/21/2022   OPHTHALMOLOGY EXAM  03/07/2023   Medicare Annual Wellness (AWV)  07/03/2023   Pneumonia Vaccine 77 Years old  Completed   INFLUENZA VACCINE  Completed   DEXA SCAN  Completed   COVID-19 Vaccine  Completed   Hepatitis C Screening  Completed   HPV VACCINES  Aged Out   COLONOSCOPY (Pts 45-18yr Insurance coverage will need to be confirmed)  Discontinued    Health Maintenance  Health Maintenance Due  Topic Date Due   Diabetic kidney evaluation - Urine ACR  Never done   Zoster Vaccines- Shingrix (1 of 2) Never done   DTaP/Tdap/Td (3 - Tdap) 03/01/2015   FOOT EXAM  05/10/2022    Colorectal cancer screening: Type of screening: Colonoscopy. Completed 09/14/2021. Repeat every 10 years  Mammogram status: Completed 09/08/2021. Repeat every year  DEXA Scan: 04/19/2021  Lung Cancer Screening: (Low Dose CT Chest recommended if Age 76-80years, 30 pack-year currently smoking OR have quit w/in 15years.) does not qualify.   Lung Cancer Screening Referral: not applicable   Additional Screening:  Hepatitis C Screening:  does qualify; Completed 10/04/2015  Vision Screening: Recommended annual ophthalmology exams for early detection of glaucoma and other disorders of the eye. Is the patient up to date with their annual eye exam?  Yes  Who is the provider or what is the name of the office in which the patient attends annual eye exams? Dr. GSchuyler AmorIf pt is not established with a provider, would they like to be referred to a provider to establish care? No .   Dental Screening: Recommended annual dental exams for proper oral hygiene  Community Resource Referral / Chronic Care Management: CRR required this visit?  No   CCM required this visit?  No      Plan:     I have personally reviewed and noted the following in the patient's chart:   Medical and social history Use of alcohol, tobacco or illicit drugs  Current medications and supplements including opioid prescriptions. Patient is not currently taking opioid prescriptions. Functional ability and status Nutritional status Physical activity Advanced directives List of other physicians Hospitalizations, surgeries, and ER visits in previous 12 months Vitals Screenings to include cognitive, depression, and falls Referrals and appointments  In addition, I have reviewed and discussed with patient certain preventive protocols, quality metrics, and best practice recommendations. A written personalized care plan for preventive services as well as general preventive health recommendations were provided to patient.    Ms. JBurkhead, Thank you for taking time to come for your Medicare Wellness Visit. I appreciate your ongoing commitment to your health goals. Please review the following plan we discussed and let me know if I can assist you in the future.   These are the goals we discussed:  Goals       Acknowledge receipt of Advanced Directive package     Blood Pressure < 140/90     HEMOGLOBIN A1C < 7.0     Management Diabetes and Blood Pressure     Care  Coordination Interventions: Provided education to patient about basic DM disease process Evaluation of current treatment plan related to hypertension self management and patient's adherence to plan as established by provider    Interventions Today    Flowsheet Row Most Recent Value  Chronic Disease Discussed/Reviewed   Chronic disease discussed/reviewed during today's visit Diabetes  General Interventions   General Interventions Discussed/Reviewed General Interventions Discussed, Annual Eye Exam, Doctor Visits  Doctor Visits Discussed/Reviewed Annual Wellness Visits, Doctor Visits Discussed  Education Interventions   Education Provided Provided Verbal Education  Provided Verbal Education On Blood Sugar Monitoring, Eye Care, Exercise  This is a list of the screening recommended for you and due dates:  Health Maintenance  Topic Date Due   Yearly kidney health urinalysis for diabetes  Never done   Zoster (Shingles) Vaccine (1 of 2) Never done   DTaP/Tdap/Td vaccine (3 - Tdap) 03/01/2015   Complete foot exam   05/10/2022   Hemoglobin A1C  07/12/2022   Yearly kidney function blood test for diabetes  10/21/2022   Eye exam for diabetics  03/07/2023   Medicare Annual Wellness Visit  07/03/2023   Pneumonia Vaccine  Completed   Flu Shot  Completed   DEXA scan (bone density measurement)  Completed   COVID-19 Vaccine  Completed   Hepatitis C Screening: USPSTF Recommendation to screen - Ages 34-79 yo.  Completed   HPV Vaccine  Aged Out   Colon Cancer Screening  Discontinued     Wilson Singer, [facility]   07/03/2022  Nurse Notes: Approximately 30 minute Non-Face -To-Face Medicare Wellness Visit

## 2022-06-30 NOTE — Patient Instructions (Signed)

## 2022-07-03 ENCOUNTER — Ambulatory Visit (INDEPENDENT_AMBULATORY_CARE_PROVIDER_SITE_OTHER): Payer: Medicare HMO

## 2022-07-03 DIAGNOSIS — Z5986 Financial insecurity: Secondary | ICD-10-CM | POA: Diagnosis not present

## 2022-07-03 DIAGNOSIS — Z0000 Encounter for general adult medical examination without abnormal findings: Secondary | ICD-10-CM | POA: Diagnosis not present

## 2022-07-05 ENCOUNTER — Telehealth: Payer: Self-pay

## 2022-07-05 NOTE — Telephone Encounter (Signed)
   Telephone encounter was:  Unsuccessful.  07/05/2022 Name: Mallory Hayes MRN: [id number] DOB: 15-Jun-1945  Unsuccessful outbound call made today to assist with:  Financial Difficulties related to Financial strain  Outreach Attempt:  1st Attempt  A HIPAA compliant voice message was left requesting a return call.  Instructed patient to call back .    [facility] [phone] [address], [place], [place] [address] Phone: [phone] Email: [email or web address]

## 2022-07-06 ENCOUNTER — Telehealth: Payer: Self-pay

## 2022-07-06 NOTE — Telephone Encounter (Signed)
   Telephone encounter was:  Unsuccessful.  07/06/2022 Name: Mallory Hayes MRN: [id number] DOB: 1945-12-20  Unsuccessful outbound call made today to assist with:   Utility Assistance   Outreach Attempt:  2nd Attempt  A HIPAA compliant voice message was left requesting a return call.  Instructed patient to call back     [facility] [phone] [address], [place], [place] [address] Phone: [phone] Email: [email or web address]

## 2022-07-09 ENCOUNTER — Telehealth: Payer: Self-pay

## 2022-07-09 NOTE — Telephone Encounter (Signed)
   Telephone encounter was:  Unsuccessful.  07/09/2022 Name: Mallory Hayes MRN: [id number] DOB: 11/17/1945  Unsuccessful outbound call made today to assist with:  Financial Difficulties related to Financial strain  Outreach Attempt:  2nd Attempt  A HIPAA compliant voice message was left requesting a return call.  Instructed patient to call back .    [facility] [phone] [address], [place], [place] [address] Phone: [phone] Email: [email or web address]

## 2022-07-09 NOTE — Telephone Encounter (Signed)
Opened in error

## 2022-07-10 ENCOUNTER — Telehealth: Payer: Self-pay | Admitting: Family Medicine

## 2022-07-10 ENCOUNTER — Telehealth: Payer: Self-pay

## 2022-07-10 NOTE — Telephone Encounter (Signed)
Called patient to schedule Medicare Annual Wellness Visit (AWV). Left message for patient to call back and schedule Medicare Annual Wellness Visit (AWV).  Last date of AWV: 09/05/2020   Please schedule an appointment at any time with Nurse Health Advisor.  If any questions, please contact me at [phone].    Thank you,  [place] Direct dial  [phone]

## 2022-07-10 NOTE — Telephone Encounter (Signed)
   Telephone encounter was:  Unsuccessful.  07/10/2022 Name: Mallory Hayes MRN: [id number] DOB: 20-Jan-1946  Unsuccessful outbound call made today to assist with:  Financial Difficulties related to Financial Strain  Outreach Attempt:  3rd Attempt.  Referral closed unable to contact patient.  A HIPAA compliant voice message was left requesting a return call.  Instructed patient to call back     [facility] [phone] [address], [place], [place] [address] Phone: [phone] Email: [email or web address]

## 2022-07-12 ENCOUNTER — Other Ambulatory Visit: Payer: Self-pay | Admitting: Cardiovascular Disease

## 2022-07-12 NOTE — Telephone Encounter (Signed)
Rx(s) sent to pharmacy electronically.  

## 2022-07-19 DIAGNOSIS — E1122 Type 2 diabetes mellitus with diabetic chronic kidney disease: Secondary | ICD-10-CM | POA: Diagnosis not present

## 2022-07-19 DIAGNOSIS — N183 Chronic kidney disease, stage 3 unspecified: Secondary | ICD-10-CM | POA: Diagnosis not present

## 2022-07-19 DIAGNOSIS — I129 Hypertensive chronic kidney disease with stage 1 through stage 4 chronic kidney disease, or unspecified chronic kidney disease: Secondary | ICD-10-CM | POA: Diagnosis not present

## 2022-07-19 DIAGNOSIS — E119 Type 2 diabetes mellitus without complications: Secondary | ICD-10-CM | POA: Diagnosis not present

## 2022-07-26 ENCOUNTER — Ambulatory Visit: Payer: Medicare HMO | Admitting: Family Medicine

## 2022-07-31 ENCOUNTER — Ambulatory Visit: Payer: Self-pay

## 2022-07-31 NOTE — Patient Instructions (Signed)
Visit Information  Thank you for taking time to visit with me today. Please don't hesitate to contact me if I can be of assistance to you.   Following are the goals we discussed today:   Goals Addressed             This Visit's Progress    Management Diabetes and Blood Pressure       Patient Goals/Self Care Activities: -Patient/Caregiver will self-administer medications as prescribed as evidenced by self-report/primary caregiver report  -Patient/Caregiver will attend all scheduled provider appointments as evidenced by clinician review of documented attendance to scheduled appointments and patient/caregiver report -Patient/Caregiver will call provider office for new concerns or questions as evidenced by review of documented incoming telephone call notes and patient report  -Checks BP and records as discussed -Follows a low sodium diet/DASH diet -check blood sugar at prescribed times  Patient does not check sugars and blood pressure.  Expressed the importance of self monitoring.    Patient continues to receive meals on wheels due to not being able to prepare meals as usual.      Interventions Today    Flowsheet Row Most Recent Value  Chronic Disease   Chronic disease during today's visit Diabetes  General Interventions   General Interventions Discussed/Reviewed General Interventions Reviewed, Annual Eye Exam, Health Screening, Community Resources  [Discussed with Patient [employer] and equal payment program to help with cost of electricity.  Patient has not had any disconnect notice. Patient has missed calls for resources. Patient to call.]  Doctor Visits Discussed/Reviewed Doctor Visits Discussed, Annual Wellness Visits  Health Screening Mammogram  Exercise Interventions   Exercise Discussed/Reviewed Exercise Discussed  Education Interventions   Education Provided Provided Education  Provided Verbal Education On Nutrition, Blood Sugar Monitoring, Medication, Other  [Blood  pressure monitoring]  Mental Health Interventions   Mental Health Discussed/Reviewed Mental Health Discussed, Depression  Nutrition Interventions   Nutrition Discussed/Reviewed Nutrition Discussed, Decreasing sugar intake, Portion sizes, Carbohydrate meal planning, Adding fruits and vegetables, Decreasing salt  Pharmacy Interventions   Pharmacy Dicussed/Reviewed Pharmacy Topics Discussed, Medications and their functions  Safety Interventions   Safety Discussed/Reviewed Fall Risk                 Our next appointment is by telephone on 10/01/22 at 1000  Please call the care guide team at [phone] if you need to cancel or reschedule your appointment.   If you are experiencing a Mental Health or [place] or need someone to talk to, please call the Suicide and Crisis Lifeline: 988   The patient verbalized understanding of instructions, educational materials, and care plan provided today and agreed to receive a mailed copy of patient instructions, educational materials, and care plan.   The patient has been provided with contact information for the care management team and has been advised to call with any health related questions or concerns.   [name], RN, MSN [facility] Care Management Coordinator Direct Line [phone]

## 2022-07-31 NOTE — Patient Outreach (Signed)
  Care Coordination   Follow Up Visit Note   07/31/2022 Name: Mallory Hayes MRN: [id number] DOB: [birth year]  Mallory Hayes is a 77 y.o. year old female who sees Ardelia Mems, Delorse Limber, MD for primary care. I spoke with  Mallory Hayes by phone today.  What matters to the patients health and wellness today?  Maintaining health    Goals Addressed             This Visit's Progress    Management Diabetes and Blood Pressure       Patient Goals/Self Care Activities: -Patient/Caregiver will self-administer medications as prescribed as evidenced by self-report/primary caregiver report  -Patient/Caregiver will attend all scheduled provider appointments as evidenced by clinician review of documented attendance to scheduled appointments and patient/caregiver report -Patient/Caregiver will call provider office for new concerns or questions as evidenced by review of documented incoming telephone call notes and patient report  -Checks BP and records as discussed -Follows a low sodium diet/DASH diet -check blood sugar at prescribed times  Patient does not check sugars and blood pressure.  Expressed the importance of self monitoring.    Patient continues to receive meals on wheels due to not being able to prepare meals as usual.      Interventions Today    Flowsheet Row Most Recent Value  Chronic Disease   Chronic disease during today's visit Diabetes  General Interventions   General Interventions Discussed/Reviewed General Interventions Reviewed, Annual Eye Exam, Health Screening, Community Resources  [Discussed with Patient [employer] and equal payment program to help with cost of electricity.  Patient has not had any disconnect notice. Patient has missed calls for resources. Patient to call.]  Doctor Visits Discussed/Reviewed Doctor Visits Discussed, Annual Wellness Visits  Health Screening Mammogram  Exercise Interventions   Exercise Discussed/Reviewed Exercise Discussed  Education  Interventions   Education Provided Provided Education  Provided Verbal Education On Nutrition, Blood Sugar Monitoring, Medication, Other  [Blood pressure monitoring]  Mental Health Interventions   Mental Health Discussed/Reviewed Mental Health Discussed, Depression  Nutrition Interventions   Nutrition Discussed/Reviewed Nutrition Discussed, Decreasing sugar intake, Portion sizes, Carbohydrate meal planning, Adding fruits and vegetables, Decreasing salt  Pharmacy Interventions   Pharmacy Dicussed/Reviewed Pharmacy Topics Discussed, Medications and their functions  Safety Interventions   Safety Discussed/Reviewed Fall Risk                 SDOH assessments and interventions completed:  Yes  SDOH Interventions Today    Flowsheet Row Most Recent Value  SDOH Interventions   Utilities Interventions Intervention Not Indicated        Care Coordination Interventions:  Yes, provided   Follow up plan: Follow up call scheduled for June    Encounter Outcome:  Pt. Visit Completed   [name], RN, MSN [facility] Care Management Coordinator Direct Line [phone]

## 2022-09-04 ENCOUNTER — Encounter: Payer: Self-pay | Admitting: Family Medicine

## 2022-09-04 ENCOUNTER — Other Ambulatory Visit: Payer: Self-pay | Admitting: Family Medicine

## 2022-09-04 DIAGNOSIS — Z1231 Encounter for screening mammogram for malignant neoplasm of breast: Secondary | ICD-10-CM

## 2022-09-04 DIAGNOSIS — Z0000 Encounter for general adult medical examination without abnormal findings: Secondary | ICD-10-CM

## 2022-09-18 ENCOUNTER — Ambulatory Visit (INDEPENDENT_AMBULATORY_CARE_PROVIDER_SITE_OTHER): Payer: Medicare HMO | Admitting: Family Medicine

## 2022-09-18 VITALS — BP 179/62 | HR 59 | Ht 61.0 in | Wt 109.5 lb

## 2022-09-18 DIAGNOSIS — I10 Essential (primary) hypertension: Secondary | ICD-10-CM

## 2022-09-18 DIAGNOSIS — M79642 Pain in left hand: Secondary | ICD-10-CM

## 2022-09-18 DIAGNOSIS — M79643 Pain in unspecified hand: Secondary | ICD-10-CM | POA: Insufficient documentation

## 2022-09-18 DIAGNOSIS — I509 Heart failure, unspecified: Secondary | ICD-10-CM | POA: Diagnosis not present

## 2022-09-18 DIAGNOSIS — I130 Hypertensive heart and chronic kidney disease with heart failure and stage 1 through stage 4 chronic kidney disease, or unspecified chronic kidney disease: Secondary | ICD-10-CM | POA: Diagnosis not present

## 2022-09-18 MED ORDER — DICLOFENAC SODIUM 1 % EX GEL
2.0000 g | Freq: Four times a day (QID) | CUTANEOUS | 0 refills | Status: AC
Start: 2022-09-18 — End: ?

## 2022-09-18 NOTE — Patient Instructions (Signed)
It was great seeing you today!  Today we discussed your hand pain, this seems due to the recent increase in activity and possibly due to arthritis. I have prescribed voltaren gel, please apply this to the affected area 3-4 times a day. We will also get imaging to make sure there is no fracture or other abnormality present. In the meantime, please rest your hand and avoid doing any activity that worsens the pain and no lifting heavy objects.   If you notice any swelling, redness or experience a fever then please return to our clinic.   Please follow up at your next scheduled appointment, if anything arises between now and then, please don't hesitate to contact our office.   Thank you for allowing [place] to be a part of your medical care!  Thank you, Dr. Robyne Peers

## 2022-09-18 NOTE — Progress Notes (Unsigned)
    SUBJECTIVE:   CHIEF COMPLAINT / HPI:   Patient presents for concern of pain and weakness of the index finger of her left hand. Reports that this started about a week ago. She was working outside and continuously pulling grass outside before it occurred, prior to this had not had any other concerns. Denies any other trauma or injury. This has never occurred before. She notices that her hand feels painful sometimes too and has some difficulty picking up heavy objects. If that finger is not involved then she does not seem to have any issues with doing other tasks such as sweeping and washing dishes. Denies fever, chills or other symptoms. Other than this, she says that she feels fine.   Denies any chest pain, shortness of breath, localized weakness on either side of the body, headaches and vision changes. Taking coreg, amlodipine and lisinopril. Only had amlodipine earlier this morning, last took the rest of her medications last night.   OBJECTIVE:   BP (!) 179/62   Pulse (!) 59   Ht 5\' 1"  (1.549 m)   Wt 109 lb 8 oz (49.7 kg)   SpO2 100%   BMI 20.69 kg/m   General: Patient well-appearing, in no acute distress. CV: RRR, no murmurs or gallops auscultated Resp: CTAB, normal work of breathing noted MSK: full flexion and extension of all digits, no edema or cyanosis noted of index finger, no erythema noted, negative Tinel's on the left  Neuro: CN 2-12 grossly intact, 5/5 UE and LE strength bilaterally, interosseus strength intact, gross sensation intact, normal gait   ASSESSMENT/PLAN:   Hand pain -left hand pain, neurovascular intact and exam reassuring. Likely secondary to overuse injury with possible underlying osteoarthritis. Low concern for inflammatory or infectious process although considered gout. No signs of systemic illness.  -will obtain x-ray to rule out fracture or dislocation although low suspicion -prescribed voltaren gel, want to limit NSAIDs as patient sees nephrology  outpatient with Cr being closely monitored -recommended rest and activity modifications -follow up as appropriate   Essential hypertension -BP remains elevated upon recheck, likely secondary to not taking her medications earlier today. Discussed the importance of daily compliance. Reassuringly she remains asymptomatic.  -strict return and ED precautions discussed and provided -instructed to follow up with PCP within the next 1-2 weeks, I did not see an appointment scheduled so I called patient and left voicemail regarding encouraging her to schedule at her earliest convenience. Appointment scheduled for 3/24 per chart reviewed.     -PHQ-9 score of 2 with negative question 9 reviewed.   Reece Leader, DO [facility]

## 2022-09-18 NOTE — Assessment & Plan Note (Signed)
-  left hand pain, neurovascular intact and exam reassuring. Likely secondary to overuse injury with possible underlying osteoarthritis. Low concern for inflammatory or infectious process although considered gout. No signs of systemic illness.  -will obtain x-ray to rule out fracture or dislocation although low suspicion -prescribed voltaren gel, want to limit NSAIDs as patient sees nephrology outpatient with Cr being closely monitored -recommended rest and activity modifications -follow up as appropriate

## 2022-09-19 ENCOUNTER — Ambulatory Visit
Admission: RE | Admit: 2022-09-19 | Discharge: 2022-09-19 | Disposition: A | Discharge status: Home / Self Care | Payer: Medicare HMO | Source: Ambulatory Visit | Attending: Family Medicine | Admitting: Family Medicine

## 2022-09-19 DIAGNOSIS — M1990 Unspecified osteoarthritis, unspecified site: Secondary | ICD-10-CM | POA: Diagnosis not present

## 2022-09-19 DIAGNOSIS — M79642 Pain in left hand: Secondary | ICD-10-CM

## 2022-09-19 DIAGNOSIS — M25542 Pain in joints of left hand: Secondary | ICD-10-CM | POA: Diagnosis not present

## 2022-09-19 NOTE — Assessment & Plan Note (Signed)
-  BP remains elevated upon recheck, likely secondary to not taking her medications earlier today. Discussed the importance of daily compliance. Reassuringly she remains asymptomatic.  -strict return and ED precautions discussed and provided -instructed to follow up with PCP within the next 1-2 weeks, I did not see an appointment scheduled so I called patient and left voicemail regarding encouraging her to schedule at her earliest convenience. Appointment scheduled for 3/24 per chart reviewed.

## 2022-09-20 NOTE — Progress Notes (Deleted)
    SUBJECTIVE:   CHIEF COMPLAINT / HPI:   HTN recheck Currently prescribed amlodipine 10 mg and lisinopril 40 mg. Other HTN-favorable medications include carvedilol 8.125 mg, empagliflozin 10 mg.   BP Readings from Last 3 Encounters:  09/18/22 (!) 179/62  03/02/22 132/68  01/30/22 126/66     PERTINENT  PMH / PSH: T2DM, HTN, HLD, CKD 3, diastolic CHF, OSA, allergic rhinitis   OBJECTIVE:   There were no vitals taken for this visit.  ***  ASSESSMENT/PLAN:   No problem-specific Assessment & Plan notes found for this encounter.   TDAP Shingrix Urine microalb Cbc, BMP, A1c, lipids  Fayette Pho, MD [facility]

## 2022-09-21 ENCOUNTER — Ambulatory Visit: Payer: Medicare HMO | Admitting: Family Medicine

## 2022-09-21 ENCOUNTER — Other Ambulatory Visit: Payer: Self-pay | Admitting: Family Medicine

## 2022-09-21 ENCOUNTER — Ambulatory Visit (INDEPENDENT_AMBULATORY_CARE_PROVIDER_SITE_OTHER): Payer: Medicare HMO | Admitting: Family Medicine

## 2022-09-21 ENCOUNTER — Other Ambulatory Visit: Payer: Self-pay

## 2022-09-21 VITALS — BP 160/82 | HR 55 | Ht 61.0 in | Wt 110.0 lb

## 2022-09-21 DIAGNOSIS — E1169 Type 2 diabetes mellitus with other specified complication: Secondary | ICD-10-CM | POA: Diagnosis not present

## 2022-09-21 DIAGNOSIS — E785 Hyperlipidemia, unspecified: Secondary | ICD-10-CM

## 2022-09-21 DIAGNOSIS — M79645 Pain in left finger(s): Secondary | ICD-10-CM | POA: Diagnosis not present

## 2022-09-21 DIAGNOSIS — I10 Essential (primary) hypertension: Secondary | ICD-10-CM | POA: Diagnosis not present

## 2022-09-21 DIAGNOSIS — Z23 Encounter for immunization: Secondary | ICD-10-CM

## 2022-09-21 LAB — POCT GLYCOSYLATED HEMOGLOBIN (HGB A1C)

## 2022-09-21 MED ORDER — INSULIN PEN NEEDLE 32G X 4 MM MISC
1.0000 | Freq: Every day | 11 refills | Status: DC
Start: 2022-09-21 — End: 2022-11-20

## 2022-09-21 MED ORDER — OLMESARTAN-AMLODIPINE-HCTZ 40-10-25 MG PO TABS
1.0000 | ORAL_TABLET | Freq: Every day | ORAL | 2 refills | Status: DC
Start: 2022-09-21 — End: 2022-12-21

## 2022-09-21 MED ORDER — TRESIBA FLEXTOUCH 100 UNIT/ML ~~LOC~~ SOPN
6.0000 [IU] | PEN_INJECTOR | Freq: Every day | SUBCUTANEOUS | 11 refills | Status: DC
Start: 2022-09-21 — End: 2022-09-21

## 2022-09-21 NOTE — Progress Notes (Signed)
SUBJECTIVE:   CHIEF COMPLAINT / HPI:   Mallory Hayes is a 77 y.o. female who presents to the [facility] today to discuss the following concerns:   Hypertension F/U She was last seen in our office on 5/21 for left hand pain.  Her blood pressure was elevated at that time to 179/62 and she returns today for a blood pressure check. Current medications include Lisinopril 40 mg daily, Jardiance 10 mg daily, carvedilol 3.125 mg twice daily, amlodipine 10 mg. Reports decent compliance, sometimes forgets to take second dose of coreg, doesn't always take Jardiance. Has home BP cuff but not checking pressures. Denies chest pain, shortness of breath, lower extremity edema, headaches, and vision changes.   Left Hand Pain Seen for this at last visit as well. She was prescribed Voltaren gel though she states she hasn't taken it because she read it could cause heart attack or stroke. She feels there is a "fullness".   Diabetes, Type 2 - Last A1c 7.6 in September - Medications: Taking 500 mg Metformin, Jardiance 10 mg  - Compliance: Sometimes forgets to take her Jardiance  - Checking BG at home: Does not  - Diet: Has not made any changes  - Exercise: Tries to walk every day if weather permits  - Eye exam: UTD  - Foot exam: Due  - Microalbumin: Collected  - Statin: Rosuvastatin 20 mg  - Voiding often. Feels thirsty often. Has sweet cravings often.   PERTINENT  PMH / PSH: Hypertension, diastolic CHF, OSA, type 2 diabetes  OBJECTIVE:   BP (!) 160/82   Pulse (!) 55   Ht 5\' 1"  (1.549 m)   Wt 110 lb (49.9 kg)   SpO2 100%   BMI 20.78 kg/m   Vitals:   09/21/22 1048 09/21/22 1102  BP: (!) 188/63 (!) 160/82  Pulse: (!) 55   SpO2: 100%    General: NAD, pleasant, able to participate in exam Cardiac: RRR, no murmurs. Respiratory: CTAB, normal effort, No wheezes, rales or rhonchi Foot exam: Dried feet and callus soles.  Slightly thickened toenails bilaterally.  Sensation intact to monofilament and  light touch.  PT and DP pulses intact BL.   Left Index Finger: Normal strength and sensation.  Distal finger does look slightly more edematous and full without surrounding erythema. She does have normal ROM, no other obvious abnormalities.  Psych: Normal affect and mood  ASSESSMENT/PLAN:   1. Essential hypertension Uncontrolled on two checks. Asymptomatic. Discussed combination anti-hypertensive to help decrease medication burden. Patient was amenable to this.  - Microalbumin/Creatinine Ratio, Urine - Basic Metabolic Panel - Olmesartan-amLODIPine-HCTZ 40-10-25 MG TABS; Take 1 tablet by mouth daily.  Dispense: 30 tablet; Refill: 2 - Please recheck BMP at follow up next week (ordered future BMP)  2. Type 2 diabetes mellitus with other specified complication, without long-term current use of insulin (HCC) Surprisingly jumped greatly to 15. She is symptomatic with polyuria and polydipsia. I am thankful for Dr. Raymondo Band and the pharmacy team who met with patient today to go over insulin teaching and administration. She is understandably very nervous about her sugar and worried about her overall health.  - HgB A1c - insulin degludec (TRESIBA FLEXTOUCH) 100 UNIT/ML FlexTouch Pen; Inject 6 Units into the skin daily.  Dispense: 3 mL; Refill: 11 - Insulin Pen Needle 32G X 4 MM MISC; 1 Container by Does not apply route daily. Dispense QS for daily injection  Dispense: 100 each; Refill: 11 - HOLD Jardiance - Continue Metformin 500  mg daily for now, can likely increase to higher doses at f/u  - Consider addition GLP-1 in future visit  - Has close f/u with Dr. Raymondo Band next week.   3. Finger pain, left Unclear etiology. X-ray performed but official results pending. On my read I am unable to find any obvious abnormalities. Reassured that Voltaren gel would be safe as it is topical and systemic effects are minimal.  -Return if no improvement or worsening symptoms      Sabino Dick, DO [facility]  [facility]

## 2022-09-21 NOTE — Progress Notes (Signed)
    S: Asked by Dr. Melba Coon to assist with insulin education and adminstration instruction.    77 y.o. female who presents for diabetes evaluation, education, and management.  PMH is significant for symptomatic hperglycemia.   Patient reports Diabetes was diagnosed in 2008.   Reports nocturia of 3-4 times per night.  Patient denies hypoglycemic events.   Lab Results  Component Value Date   HGBA1C 15.0 (A) 09/21/2022    A/P: Diabetes >15 years duration and  currently hyperglycemic, A1c > 15. Patient is able to verbalize appropriate hypoglycemia management plan. Medication adherence appears good.  -Started basal insulin Tresiba (insulin degludec) 6 units daily in the morning..  -Patient educated on purpose, proper use, and potential adverse effects of insulin.  -Extensively discussed pathophysiology of diabetes, recommended lifestyle interventions, dietary effects on blood sugar control.  -Counseled on s/sx of and management of hypoglycemia.   Written patient instructions provided. Patient verbalized understanding of treatment plan.  Total time in face to face counseling 35 minutes.    Follow-up:  Pharmacist 09/27/2022 at 11 am. Patient seen with Haze Boyden PharmD Candidate and Bing Plume, PharmD Candidate.

## 2022-09-21 NOTE — Patient Instructions (Addendum)
It was wonderful to see you today.  Please bring ALL of your medications with you to every visit.   Today we talked about:  STOP your current prescription for Amlodipine and Lisinopril. Instead, START this new prescription for your blood pressure which includes Olmesartan, Amlodipine, and Hydrochlorothiazide.   START injecting 6 units of Triseba (insulin degludec) daily.   Return next week for a check.  Dr. Raymondo Band discussed insulin with you today.  Please schedule a follow up next week for blood pressure and sugar checks.   Thank you for coming to your visit as scheduled. We have had a large "no-show" problem lately, and this significantly limits our ability to see and care for patients. As a friendly reminder- if you cannot make your appointment please call to cancel. We do have a no show policy for those who do not cancel within 24 hours. Our policy is that if you miss or fail to cancel an appointment within 24 hours, 3 times in a 3-month period, you may be dismissed from our clinic.   Thank you for choosing [facility].   Please call [phone] with any questions about today's appointment.  Please be sure to schedule follow up at the front  desk before you leave today.   Sabino Dick, DO PGY-3 Family Medicine

## 2022-09-22 ENCOUNTER — Encounter: Payer: Self-pay | Admitting: Family Medicine

## 2022-09-22 LAB — MICROALBUMIN / CREATININE URINE RATIO
Creatinine, Urine: 25.1 mg/dL
Microalb/Creat Ratio: 109 mg/g creat — ABNORMAL HIGH (ref 0–29)
Microalbumin, Urine: 27.4 ug/mL

## 2022-09-25 ENCOUNTER — Encounter: Payer: Self-pay | Admitting: Podiatry

## 2022-09-25 ENCOUNTER — Ambulatory Visit (INDEPENDENT_AMBULATORY_CARE_PROVIDER_SITE_OTHER): Payer: Medicare HMO | Admitting: Podiatry

## 2022-09-25 DIAGNOSIS — E1169 Type 2 diabetes mellitus with other specified complication: Secondary | ICD-10-CM

## 2022-09-25 DIAGNOSIS — B351 Tinea unguium: Secondary | ICD-10-CM

## 2022-09-25 DIAGNOSIS — M79674 Pain in right toe(s): Secondary | ICD-10-CM | POA: Diagnosis not present

## 2022-09-25 DIAGNOSIS — M79675 Pain in left toe(s): Secondary | ICD-10-CM

## 2022-09-25 NOTE — Progress Notes (Signed)
This patient returns to my office for at risk foot care.  This patient requires this care by a professional since this patient will be at risk due to having CKD stage 3 and type 2 diabetes.   This patient is unable to cut nails herself since the patient cannot reach her nails.These nails are painful walking and wearing shoes.  This patient presents for at risk foot care today.  General Appearance  Alert, conversant and in no acute stress.  Vascular  Dorsalis pedis and posterior tibial  pulses are palpable  bilaterally.  Capillary return is within normal limits  bilaterally. Temperature is within normal limits  bilaterally.  Neurologic  Senn-Weinstein monofilament wire test within normal limits  bilaterally. Muscle power within normal limits bilaterally.  Nails Thick disfigured discolored nails with subungual debris  from hallux to fifth toes bilaterally. No evidence of bacterial infection or drainage bilaterally.  Orthopedic  No limitations of motion  feet .  No crepitus or effusions noted.  No bony pathology or digital deformities noted.  HAV  B/L and tailors bunion  B/L.  Skin  normotropic skin with no porokeratosis noted bilaterally.  No signs of infections or ulcers noted.     Onychomycosis  Pain in right toes  Pain in left toes  Consent was obtained for treatment procedures.   Mechanical debridement of nails 1-5  bilaterally performed with a nail nipper.  Filed with dremel without incident.    Return office visit     3 months                 Told patient to return for periodic foot care and evaluation due to potential at risk complications.   Tiarrah Saville DPM  

## 2022-09-27 ENCOUNTER — Ambulatory Visit (INDEPENDENT_AMBULATORY_CARE_PROVIDER_SITE_OTHER): Payer: Medicare HMO | Admitting: Pharmacist

## 2022-09-27 ENCOUNTER — Other Ambulatory Visit (HOSPITAL_COMMUNITY): Payer: Self-pay

## 2022-09-27 ENCOUNTER — Encounter: Payer: Self-pay | Admitting: Pharmacist

## 2022-09-27 VITALS — BP 154/54 | HR 55 | Wt 106.4 lb

## 2022-09-27 DIAGNOSIS — E1169 Type 2 diabetes mellitus with other specified complication: Secondary | ICD-10-CM

## 2022-09-27 MED ORDER — FREESTYLE LIBRE 3 SENSOR MISC: 11 refills | Status: DC

## 2022-09-27 MED ORDER — FREESTYLE LIBRE 3 READER DEVI: 1.0000 | Freq: Once | 0 refills | Status: DC

## 2022-09-27 NOTE — Patient Instructions (Signed)
It was nice to see you today!  Your goal blood sugar is 80-130 before eating and less than 180 after eating.  Medication Changes: Continue Lantus 6 units daily and metformin 500 mg once daily HOLD Jardiance until instructed to resume.   Monitor blood sugars at home and keep a log (glucometer or piece of paper) to bring with you to your next visit.  Keep up the good work with diet and exercise. Aim for a diet full of vegetables, fruit and lean meats (chicken, [place], fish). Try to limit salt intake by eating fresh or frozen vegetables (instead of canned), rinse canned vegetables prior to cooking and do not add any additional salt to meals.

## 2022-09-27 NOTE — Progress Notes (Signed)
S:    Chief Complaint  Patient presents with   Medication Management    T2DM   77 y.o. female who presents for diabetes evaluation, education, and management.  PMH is significant for T2DM, HTN.  Patient was referred by Dr. Melba Coon on 09/21/2022. Patient was last seen by Primary Care Provider, Dr. Pollie Meyer, on 01/11/2022.   At last visit, A1c was 15%, elevated from 7.6 in September 2023. Patient was started on Lantus (insulin glargine) 6 units daily and was instructed to hold Jardiance (empagliflozin).   Today, patient arrives in good spirits and presents without any assistance. Patient reports she has NOT yet started insulin. BG this morning was 591 mg/dL. She has NOT been holding Jardiance as instructed. BG 541 in clinic.   Patient reports Diabetes was diagnosed in 2008. Recently started insulin 08/2022.  Current diabetes medications include: metformin 500 mg daily, Jardiance (empagliflozin) 10 mg daily Current hypertension medications include: olmesartan-amlodipine-HCTZ  40-10-25 mg daily Current hyperlipidemia medications include: rosuvastatin 20 mg daily  Patient denies adherence with medications, reports she has not started insulin as instructed.   Insurance coverage: [employer]  Patient denies hypoglycemic events.  Home BG this morning: 591 mg/dL Reported home fasting blood sugars: not checking   Patient reports nocturia (nighttime urination).  Patient denies neuropathy (nerve pain). Patient denies visual changes. Patient reports self foot exams.   Patient reported dietary habits: Eats 3 meals/day Breakfast: oatmeal and egg, milk, coffee with sweetner Lunch: McChicken every day for lunch with High C or Fanta  Dinner: Pot pie with broccoli. Eats the same thing every day Snacks: Frittos and coconut cookies  Drinks: milk, coffee, high C and Fanta, sugar free Gingerale   Patient-reported exercise habits: garden    O:  Review of Systems  All other systems  reviewed and are negative.   Physical Exam Constitutional:      Appearance: Normal appearance.  Pulmonary:     Effort: Pulmonary effort is normal.  Neurological:     Mental Status: She is alert.  Psychiatric:        Mood and Affect: Mood normal.        Behavior: Behavior normal.        Thought Content: Thought content normal.      Lab Results  Component Value Date   HGBA1C 15.0 (A) 09/21/2022   Vitals:   09/27/22 1123 09/27/22 1144  BP: (!) 150/49 (!) 154/54  Pulse: (!) 49 (!) 55  SpO2: 100%     Lipid Panel     Component Value Date/Time   CHOL 140 10/20/2021 1216   TRIG 75 10/20/2021 1216   HDL 70 10/20/2021 1216   CHOLHDL 2.0 10/20/2021 1216   CHOLHDL 1.8 12/02/2014 0952   VLDL 12 12/02/2014 0952   LDLCALC 55 10/20/2021 1216   LDLDIRECT 62 07/28/2012 0927    Clinical Atherosclerotic Cardiovascular Disease (ASCVD): No  The 10-year ASCVD risk score (Arnett DK, et al., 2019) is: 34%   Values used to calculate the score:     Age: 77 years     Sex: Female     Is Non-Hispanic African American: Yes     Diabetic: Yes     Tobacco smoker: No     Systolic Blood Pressure: 154 mmHg     Is BP treated: Yes     HDL Cholesterol: 70 mg/dL     Total Cholesterol: 140 mg/dL    A/P: Diabetes longstanding, currently uncontrolled based on A1c. Patient is able  to verbalize appropriate hypoglycemia management plan. Medication adherence needs improvement, patient has hesitancy administering insulin. Will follow patient closely for insulin titration.  -Started basal insulin Lantus (insulin glargine) 6 units daily in the morning. -Instructed patient to hold SGLT2-I Jardiance (empagliflozin) 10 mg. -Continued metformin 500 mg daily.  -Educated patient on proper insulin administration. Patient gave herself her first insulin injection in clinic today.  -Sent prescription for FreeStyle [place] 3 reader and device. Will have patient bring reader and sensor to next visit for initiation.   (She has used [place] 2 briefly in the past). -Patient educated on purpose, proper use, and potential adverse effects of insulin.  -Extensively discussed pathophysiology of diabetes, recommended lifestyle interventions, dietary effects on blood sugar control.  -Counseled on s/sx of and management of hypoglycemia.  -Next A1c anticipated August 2023.   Written patient instructions provided. Patient verbalized understanding of treatment plan.  Total time in face to face counseling 50 minutes.    Follow-up:  Pharmacist 1 week. Patient seen with Haze Boyden PharmD Candidate and Valeda Malm, PharmD, PGY2 Pharmacy Resident.

## 2022-09-27 NOTE — Assessment & Plan Note (Signed)
Diabetes longstanding, currently uncontrolled based on A1c. Patient is able to verbalize appropriate hypoglycemia management plan. Medication adherence needs improvement, patient has hesitancy administering insulin. Will follow patient closely for insulin titration.  -Started basal insulin Lantus (insulin glargine) 6 units daily in the morning. -Instructed patient to hold SGLT2-I Jardiance (empagliflozin) 10 mg. -Continued metformin 500 mg daily.  -Educated patient on proper insulin administration. Patient gave herself her first insulin injection in clinic today.  -Sent prescription for FreeStyle [place] 3 reader and device. Will have patient bring reader and sensor to next visit for initiation.  (She has used [place] 2 briefly in the past). -Patient educated on purpose, proper use, and potential adverse effects of insulin.  -Extensively discussed pathophysiology of diabetes, recommended lifestyle interventions, dietary effects on blood sugar control.  -Counseled on s/sx of and management of hypoglycemia.

## 2022-09-28 NOTE — Progress Notes (Signed)
Reviewed and agree with Dr Koval's plan.   

## 2022-10-01 ENCOUNTER — Other Ambulatory Visit: Payer: Self-pay | Admitting: Family Medicine

## 2022-10-02 ENCOUNTER — Ambulatory Visit: Payer: Self-pay

## 2022-10-02 NOTE — Patient Instructions (Signed)
Visit Information  Thank you for taking time to visit with me today. Please don't hesitate to contact me if I can be of assistance to you.   Following are the goals we discussed today:   Goals Addressed             This Visit's Progress    Management Diabetes and Blood Pressure       Patient Goals/Self Care Activities: -Patient/Caregiver will self-administer medications as prescribed as evidenced by self-report/primary caregiver report  -Patient/Caregiver will attend all scheduled provider appointments as evidenced by clinician review of documented attendance to scheduled appointments and patient/caregiver report -Patient/Caregiver will call provider office for new concerns or questions as evidenced by review of documented incoming telephone call notes and patient report  -Checks BP and records as discussed -Follows a low sodium diet/DASH diet -check blood sugar at prescribed times  Patient does not check sugars and blood pressure.  Discussed with patient the importance of blood sugar monitoring now that she is on insulin.      Patient A1c greater than 15.  Extended conversation on diabetes and diet.  Encouraged sugar free drinks and limiting sweets.   She verbalized understanding.                 Our next appointment is by telephone on 11/05/22 at 1000 am  Please call the care guide team at [phone] if you need to cancel or reschedule your appointment.   If you are experiencing a Mental Health or Behavioral Health Crisis or need someone to talk to, please call the Suicide and Crisis Lifeline: 988   The patient verbalized understanding of instructions, educational materials, and care plan provided today and agreed to receive a mailed copy of patient instructions, educational materials, and care plan.   The patient has been provided with contact information for the care management team and has been advised to call with any health related questions or concerns.   Bary Leriche, RN, MSN [facility] Care Management Care Management Coordinator Direct Line [phone]

## 2022-10-02 NOTE — Patient Outreach (Signed)
  Care Coordination   Follow Up Visit Note   10/02/2022 Name: Mallory Hayes MRN: [id number] DOB: 1946/03/12  Mallory Hayes is a 77 y.o. year old female who sees Mallory Hayes, Mallory Ryder, MD for primary care. I spoke with  Mallory Hayes by phone today.  What matters to the patients health and wellness today?  Diabetes management as A1c now greater than 15.      Goals Addressed             This Visit's Progress    Management Diabetes and Blood Pressure       Patient Goals/Self Care Activities: -Patient/Caregiver will self-administer medications as prescribed as evidenced by self-report/primary caregiver report  -Patient/Caregiver will attend all scheduled provider appointments as evidenced by clinician review of documented attendance to scheduled appointments and patient/caregiver report -Patient/Caregiver will call provider office for new concerns or questions as evidenced by review of documented incoming telephone call notes and patient report  -Checks BP and records as discussed -Follows a low sodium diet/DASH diet -check blood sugar at prescribed times  Patient does not check sugars and blood pressure.  Discussed with patient the importance of blood sugar monitoring now that she is on insulin.      Patient A1c greater than 15.  Extended conversation on diabetes and diet.  Encouraged sugar free drinks and limiting sweets.   She verbalized understanding.                 SDOH assessments and interventions completed:  Yes     Care Coordination Interventions:  Yes, provided   Follow up plan: Follow up call scheduled for July.    Encounter Outcome:  Pt. Visit Completed   Mallory Leriche, RN, MSN [facility] Care Management Care Management Coordinator Direct Line [phone]

## 2022-10-03 ENCOUNTER — Ambulatory Visit (INDEPENDENT_AMBULATORY_CARE_PROVIDER_SITE_OTHER): Payer: Medicare HMO | Admitting: Pharmacist

## 2022-10-03 ENCOUNTER — Encounter: Payer: Self-pay | Admitting: Pharmacist

## 2022-10-03 DIAGNOSIS — E1169 Type 2 diabetes mellitus with other specified complication: Secondary | ICD-10-CM

## 2022-10-03 MED ORDER — LANTUS SOLOSTAR 100 UNIT/ML ~~LOC~~ SOPN
10.0000 [IU] | PEN_INJECTOR | Freq: Every day | SUBCUTANEOUS | 3 refills | Status: DC
Start: 2022-10-03 — End: 2022-10-18

## 2022-10-03 NOTE — Assessment & Plan Note (Signed)
Diabetes longstanding with recent insulin initiation.  Reports taking insulin glargine 6 units each morning since last visit. Patient is able to verbalize appropriate hypoglycemia management plan. Control is suboptimal due to inadequate insulin dose. -Increased dose of basal insulin Lantus (insulin glargine) from 6 to 10 units daily.  Assisted patient with administration of extra 4 units while in office.  Patient was able to demonstrate appropriate injection technique.  -CGM - LIbre3 ready for pick-up at pharmacy.  Asked patient to pick up Libre3 Reader and return to clinic tomorrow 6/6 at 1:30 for initiation.

## 2022-10-03 NOTE — Progress Notes (Signed)
    S:     Chief Complaint  Patient presents with   Medication Management    Diabetes - Insulin   77 y.o. female who presents for diabetes evaluation, education, and management.  PMH is significant for recent initiation of insulin.   Patient was referred and last seen by Primary Care Provider, Dr. Melba Coon, on 09/21/2022.  Pharmacy clinic follow-up on 5/30 was actual insulin initiation date due to patient anxiety/concerns.  At last visit, patient was prescribed Libre 3 CGM - she is aware that her pharmacy has the device and sensors ready for her to pick-up (she did not pick-up prior to the visit today).   Today, patient arrives in good spirits and presents without any assistance. She is less apprehensive about insulin however reports she is not feeling like herself today.     O:   Review of Systems  Constitutional:  Positive for malaise/fatigue.  All other systems reviewed and are negative.   Physical Exam Vitals reviewed.  Constitutional:      Appearance: Normal appearance.  Pulmonary:     Effort: Pulmonary effort is normal.  Neurological:     Mental Status: She is alert.  Psychiatric:        Mood and Affect: Mood normal.        Thought Content: Thought content normal.     Blood glucose this AM: 419   Lab Results  Component Value Date   HGBA1C >15 (A) 09/21/2022   HGBA1C  09/21/2022     Comment:     Notified Dr Jennette Kettle and Melba Coon   Vitals:   10/03/22 1057  BP: (!) 137/49  Pulse: (!) 52  SpO2: 100%    A/P: Diabetes longstanding with recent insulin initiation.  Reports taking insulin glargine 6 units each morning since last visit. Patient is able to verbalize appropriate hypoglycemia management plan. Control is suboptimal due to inadequate insulin dose. -Increased dose of basal insulin Lantus (insulin glargine) from 6 to 10 units daily.  Assisted patient with administration of extra 4 units while in office.  Patient was able to demonstrate appropriate injection  technique.  -CGM - LIbre3 ready for pick-up at pharmacy.  Asked patient to pick up Libre3 Reader and return to clinic tomorrow 6/6 at 1:30 for initiation.   Written patient instructions provided. Patient verbalized understanding of treatment plan.  Total time in face to face counseling 24 minutes.    Follow-up:  Pharmacist 10/04/2022. Patient seen with Lily Peer, PharmD, PGY-1 resident.

## 2022-10-03 NOTE — Patient Instructions (Addendum)
It was nice to see you today!  Your blood sugar was much improved today.  I will plan to see you tomorrow to start your LIBRE 3  Medication Changes: Continue Lantus (insulin glargine) - 10 units each morning.    Monitor blood sugars at home and keep a log (glucometer or piece of paper) to bring with you to your next visit.  Keep up the good work with diet and exercise. Aim for a diet full of vegetables, fruit and lean meats (chicken, [place], fish). Try to limit salt intake by eating fresh or frozen vegetables (instead of canned), rinse canned vegetables prior to cooking and do not add any additional salt to meals.    See you tomorrow at 1:30  - please be sure to bring your [place] Reader and sensor from the pharmacy.

## 2022-10-04 ENCOUNTER — Ambulatory Visit (INDEPENDENT_AMBULATORY_CARE_PROVIDER_SITE_OTHER): Payer: Medicare HMO | Admitting: Pharmacist

## 2022-10-04 ENCOUNTER — Ambulatory Visit: Payer: Medicare HMO | Admitting: Pharmacist

## 2022-10-04 ENCOUNTER — Encounter: Payer: Self-pay | Admitting: Pharmacist

## 2022-10-04 DIAGNOSIS — E1169 Type 2 diabetes mellitus with other specified complication: Secondary | ICD-10-CM

## 2022-10-04 NOTE — Progress Notes (Signed)
380, took 34     S:     Stage manager Complaint  Patient presents with   Medication Management    T2DM, CGM placement   77 y.o. female who presents for diabetes evaluation, education, and management.  PMH is significant for diabetes and recent insulin initiation. At last visit, yesterday, she did not yet pick-up her [place] 3 READER she was asked to obtain from her pharmacy and return today for set-up.   Today, patient arrives in good spirits and presents without any assistance.   Reports taking diabetes medication  today: Lantus 10 units  She denies any low blood sugar symptoms.    O:   Review of Systems  Constitutional:  Positive for malaise/fatigue.    Physical Exam Pulmonary:     Effort: Pulmonary effort is normal.  Neurological:     Mental Status: She is alert.  Psychiatric:        Mood and Affect: Mood normal.        Behavior: Behavior normal.       Lab Results  Component Value Date   HGBA1C >15 (A) 09/21/2022   HGBA1C  09/21/2022     Comment:     Notified Dr Jennette Kettle and Melba Coon   A/P: Diabetes longstanding recently initiated insulin and does not like checking blood sugar with finger sticks.  Her anxiety about her glucose readings may improve with use of Continuous Glucose Monitoring.  Patient is able to verbalize appropriate hypoglycemia management plan following review.  Medication adherence appears good today. Control is suboptimal due to inadequate insulin regimen. -Increased dose of basal insulin Lantus (insulin glargine) from 10 to 12 units each day - starting tomorrow.   Instructed to increase to 14 units daily in 4 days if glucose readings remain higher than 150mg /dl.  -Continued ZOXW9-U Jardiance (empagliflozin) 10 mg. Counseled on sick day rules. -Patient educated on purpose, proper use, and potential adverse effects.  -Extensively discussed pathophysiology of diabetes, recommended lifestyle interventions, dietary effects on blood sugar control.  -CGM  reader and sensor initiation and education completed.  Written patient instructions provided. Patient verbalized understanding of treatment plan.   Total time in face to face counseling 37 minutes.    Follow-up:  Pharmacist 10/18/2022. PCP clinic visit in 6-8 weeks. Patient seen with Lily Peer, PharmD, PGY-1 resident and Valeda Malm, PharmD, PGY2 Pharmacy Resident.  '

## 2022-10-04 NOTE — Assessment & Plan Note (Signed)
Diabetes longstanding recently initiated insulin and does not like checking blood sugar with finger sticks.  Her anxiety about her glucose readings may improve with use of Continuous Glucose Monitoring.  Patient is able to verbalize appropriate hypoglycemia management plan following review.  Medication adherence appears good today. Control is suboptimal due to inadequate insulin regimen. -Increased dose of basal insulin Lantus (insulin glargine) from 10 to 12 units each day - starting tomorrow.   Instructed to increase to 14 units daily in 4 days if glucose readings remain higher than 150mg /dl.  -Continued ZOXW9-U Jardiance (empagliflozin) 10 mg. Counseled on sick day rules. -Patient educated on purpose, proper use, and potential adverse effects.  -Extensively discussed pathophysiology of diabetes, recommended lifestyle interventions, dietary effects on blood sugar control.  -CGM reader and sensor initiation and education completed.

## 2022-10-04 NOTE — Progress Notes (Signed)
Reviewed and agree with Dr Koval's plan.   

## 2022-10-04 NOTE — Patient Instructions (Addendum)
It was good to see you today!  Starting tomorrow, increase Lantus to 12 units. Continue 12 units on Saturday and Sunday. Next Monday, increase Lantus to 14 units (as long as blood sugar is greater than 150 mg/dL).    Sensor Application If using the App, you can tap Help in the Main Menu to access an in-app tutorial on applying a Sensor. See below for instructions on how to download the app. Apply Sensors only on the back of your upper arm. If placed in other areas, the Sensor may not function properly and could give you inaccurate readings. Avoid areas with scars, moles, stretch marks, or lumps.   Select an area of skin that generally stays flat during your normal daily activities (no bending or folding). Choose a site that is at least 1 inch (2.5 cm) away from any injection sites. To prevent discomfort or skin irritation, you should select a different site other than the one most recently used. Wash application site using a plain soap, dry, and then clean with an alcohol wipe. This will help remove any oily residue that may prevent the sensor from sticking properly. Allow site to air dry before proceeding. Note: The area MUST be clean and dry, or the Sensor may not stay on for the full wear duration specified by your Sensor insert. 4. Unscrew the cap from the Sensor Applicator and set the cap aside.  5. Place the Sensor Applicator over the prepared site and push down firmly to apply the Sensor to your body. 6. Gently pull the Sensor Applicator away from your body. The Sensor should now be attached to your skin. 7. Make sure the Sensor is secure after application. Put the cap back on the Sensor Applicator. Discard the used Engineer, agricultural according to local regulations.  What If My Sensor Falls Off or What If My Sensor Isn't Working? Call [employer] Customer Care Team at [phone] Available 7 days a week from 8AM-8PM EST, excluding holidays   The App Download the FreeStyle [place] 3 App in  your phone's app store   Load the app and select get started now Create an account  Tap scan new sensor Follow the prompts on the screen. If your sensor does not sync, try moving your phone slowly around the sensor. Phone cases may affect scanning. This will be the only time you have to scan the sensor until you apply a new sensor in 14 days.  There will be a 60 minute start up period until the app will display your glucose reading

## 2022-10-09 ENCOUNTER — Ambulatory Visit
Admission: RE | Admit: 2022-10-09 | Discharge: 2022-10-09 | Disposition: A | Discharge status: Home / Self Care | Payer: Medicare HMO | Source: Ambulatory Visit | Attending: Family Medicine | Admitting: Family Medicine

## 2022-10-09 DIAGNOSIS — Z1231 Encounter for screening mammogram for malignant neoplasm of breast: Secondary | ICD-10-CM

## 2022-10-18 ENCOUNTER — Ambulatory Visit (INDEPENDENT_AMBULATORY_CARE_PROVIDER_SITE_OTHER): Payer: Medicare HMO | Admitting: Pharmacist

## 2022-10-18 ENCOUNTER — Encounter: Payer: Self-pay | Admitting: Pharmacist

## 2022-10-18 VITALS — BP 117/52 | HR 47 | Wt 107.6 lb

## 2022-10-18 DIAGNOSIS — E1169 Type 2 diabetes mellitus with other specified complication: Secondary | ICD-10-CM | POA: Diagnosis not present

## 2022-10-18 DIAGNOSIS — I10 Essential (primary) hypertension: Secondary | ICD-10-CM | POA: Diagnosis not present

## 2022-10-18 DIAGNOSIS — E785 Hyperlipidemia, unspecified: Secondary | ICD-10-CM

## 2022-10-18 MED ORDER — LANTUS SOLOSTAR 100 UNIT/ML ~~LOC~~ SOPN
20.0000 [IU] | PEN_INJECTOR | Freq: Every day | SUBCUTANEOUS | 3 refills | Status: DC
Start: 2022-10-18 — End: 2022-10-25

## 2022-10-18 NOTE — Assessment & Plan Note (Signed)
Hypertension longstanding currently controlled. Blood pressure goal of <130/80 mmHg. Medication adherence appropriate -Continued olmesartan/amlodipine/hydrochlorothiazide 40/10/25 mg daily  - Recommended to increase water intake with her constipation from the initiation of the hydrochlorothiazide component.

## 2022-10-18 NOTE — Patient Instructions (Addendum)
It was nice to see you today!  Your goal blood sugar is 80-130 before eating and less than 180 after eating.  Medication Changes: Continue metformin 500 mg daily  Increase Lantus to 20 units daily  Monitor blood sugars at home and keep a log (glucometer or piece of paper) to bring with you to your next visit.  Keep up the good work with diet and exercise. Aim for a diet full of vegetables, fruit and lean meats (chicken, [place], fish). Try to limit salt intake by eating fresh or frozen vegetables (instead of canned), rinse canned vegetables prior to cooking and do not add any additional salt to meals.

## 2022-10-18 NOTE — Assessment & Plan Note (Signed)
Diabetes longstanding currently uncontrolled based on most A1c of >15%, but improving based on LibreView report. Patient is able to verbalize appropriate hypoglycemia management plan. Medication adherence appears appropriate. Control is suboptimal due to hesitancy with medication use.  -Increased dose of basal insulin Lantus (insulin glargine) from 14 to 20 units daily -Continued to hold SGLT2-I Jardiance (empagliflozin) 10 mg given her current elevated BG readings. Will consider re-initiation once A1c is reduced.  -Continued metformin 500 mg daily.  - Agreed to place a second Freestyle Libre 3 sensor today.  Patient performed without assistance.  (Patient verbalized interest in discontinuing sensors.  Then agreed to wear for 2 more weeks).  -Patient educated on purpose, proper use, and potential adverse effects of medications.  -Extensively discussed pathophysiology of diabetes, recommended lifestyle interventions, dietary effects on blood sugar control.  -Counseled on s/sx of and management of hypoglycemia.  - Counseled to attempt to switch ginger ale to a sugar-free version.  -Next A1c anticipated 11/2022.

## 2022-10-18 NOTE — Assessment & Plan Note (Signed)
ASCVD risk - primary prevention in patient with diabetes. Last LDL is 55 at goal of <70 mg/dL.   -Continued rosuvastatin 20 mg.

## 2022-10-18 NOTE — Progress Notes (Signed)
S:     Chief Complaint  Patient presents with   Medication Management    Diabetes management   77 y.o. female who presents for diabetes evaluation, education, and management.  PMH is significant for recent initiation of insulin.   Patient was referred and last seen by Primary Care Provider, Dr. Melba Coon, on 09/21/2022.   At last visit, Lantus was increased to 14 units daily and Freestyle Libre 3 CGM was placed.   Today, patient arrives in good spirits and presents without any assistance. She is hesitant to replace a CGM today. She reports concerns of constipation since initiation of olmesartan/amlodipine/hydrochlorothiazide.   Patient reports Diabetes was diagnosed in 2008.   Current diabetes medications include: Lantus 14 units daily, metformin 500 mg daily, Jardiance 10 mg daily (on hold) Current hypertension medications include: olmesartan/amlodipine/hydrochlorothiazide 40/10/25 mg daily Current hyperlipidemia medications include: rosuvastatin 20 mg daily  Patient reports adherence to taking all medications as prescribed.   Insurance coverage: [employer] Medicare  Patient denies hypoglycemic events.  Patient reported dietary habits:  Snacks: mandarin oranges, half gala apples, half a green banana TID, strawberries Drinks: 2 small cans of ginger ale per day (discussed 40grams of glucose/can) Patient willing to try zero-sugar ginger ale.   Patient-reported exercise habits: working in the garden  O:   Review of Systems  Gastrointestinal:  Positive for constipation.  Musculoskeletal:  Positive for joint pain.       Hand joint pain  All other systems reviewed and are negative.   Physical Exam Vitals reviewed.  Constitutional:      Appearance: Normal appearance.  Pulmonary:     Effort: Pulmonary effort is normal.     Breath sounds: Normal breath sounds.  Neurological:     Mental Status: She is alert.  Psychiatric:        Mood and Affect: Mood normal.         Behavior: Behavior normal.        Thought Content: Thought content normal.        Judgment: Judgment normal.      CGM Download:  % Time CGM is active: 83% Average Glucose: 350 mg/dL Glucose Management Indicator: 11.7%  Glucose Variability: 18.8 (goal <36%) Time in Goal:  - Time in range 70-180: 2% - Time above range: 98% - Time below range: 0% Observed patterns: consistent hyperglycemia throughout the day   Lab Results  Component Value Date   HGBA1C >15 (A) 09/21/2022   HGBA1C  09/21/2022     Comment:     Notified Dr Jennette Kettle and Melba Coon   Vitals:   10/18/22 1508  BP: (!) 117/52  Pulse: (!) 47  SpO2: 100%    Lipid Panel     Component Value Date/Time   CHOL 140 10/20/2021 1216   TRIG 75 10/20/2021 1216   HDL 70 10/20/2021 1216   CHOLHDL 2.0 10/20/2021 1216   CHOLHDL 1.8 12/02/2014 0952   VLDL 12 12/02/2014 0952   LDLCALC 55 10/20/2021 1216   LDLDIRECT 62 07/28/2012 0927    Clinical Atherosclerotic Cardiovascular Disease (ASCVD): No  The 10-year ASCVD risk score (Arnett DK, et al., 2019) is: 23.9%   Values used to calculate the score:     Age: 75 years     Sex: Female     Is Non-Hispanic African American: Yes     Diabetic: Yes     Tobacco smoker: No     Systolic Blood Pressure: 117 mmHg     Is BP treated:  Yes     HDL Cholesterol: 70 mg/dL     Total Cholesterol: 140 mg/dL    A/P: Diabetes longstanding currently uncontrolled based on most A1c of >15%, but improving based on LibreView report. Patient is able to verbalize appropriate hypoglycemia management plan. Medication adherence appears appropriate. Control is suboptimal due to hesitancy with medication use.  -Increased dose of basal insulin Lantus (insulin glargine) from 14 to 20 units daily - Continued to hold  SGLT2-I Jardiance (empagliflozin) 10 mg given her current elevated BG readings. Will consider re-initiation once A1c is reduced.  -Continued metformin 500 mg daily.  - Agreed to place a second  Freestyle Libre 3 sensor today.  Patient performed without assistance.  (Patient verbalized interest in discontinuing sensors.  Then agreed to wear for 2 more weeks).  -Patient educated on purpose, proper use, and potential adverse effects of medications.  -Extensively discussed pathophysiology of diabetes, recommended lifestyle interventions, dietary effects on blood sugar control.  -Counseled on s/sx of and management of hypoglycemia.  - Counseled to attempt to switch ginger ale to a sugar-free version.  -Next A1c anticipated 11/2022.   ASCVD risk - primary prevention in patient with diabetes. Last LDL is 55 at goal of <70 mg/dL.   -Continued rosuvastatin 20 mg.   Hypertension longstanding currently controlled. Blood pressure goal of <130/80 mmHg. Medication adherence appropriate -Continued olmesartan/amlodipine/hydrochlorothiazide 40/10/25 mg daily  - Recommended to increase water intake with her constipation from the initiation of the hydrochlorothiazide component.  Written patient instructions provided. Patient verbalized understanding of treatment plan.  Total time in face to face counseling 37 minutes.    Follow-up:  Pharmacist on 11/02/22. PCP clinic visit on 11/15/22.  Patient seen with Lily Peer, PharmD, PGY-1 resident.

## 2022-10-19 NOTE — Progress Notes (Signed)
Reviewed and agree with Dr Koval's plan.   

## 2022-10-22 ENCOUNTER — Telehealth: Payer: Self-pay | Admitting: *Deleted

## 2022-10-22 NOTE — Telephone Encounter (Signed)
   Telephone encounter was:  Successful.  10/22/2022 Name: Mallory Hayes MRN: [id number] DOB: [birth year]  Mallory Hayes is a 77 y.o. year old female who is a primary care patient of Pollie Meyer Estevan Ryder, MD . The community resource team was consulted for assistance with Food Insecurity Patient called in to see if we could renew here moms meals as may was her last month and she is still experiencing finanicial strain  Care guide performed the following interventions: Follow up call placed to the patient to discuss status of referral.  Follow Up Plan:  Care guide will follow up with patient by phone over the next days  Larisha Vencill Greenauer -[facility], Population Health [phone] [address] [place] , [place] [address] Email : [email or web address]

## 2022-10-25 ENCOUNTER — Telehealth: Payer: Self-pay | Admitting: Pharmacist

## 2022-10-25 DIAGNOSIS — E1169 Type 2 diabetes mellitus with other specified complication: Secondary | ICD-10-CM

## 2022-10-25 MED ORDER — LANTUS SOLOSTAR 100 UNIT/ML ~~LOC~~ SOPN
18.0000 [IU] | PEN_INJECTOR | Freq: Every day | SUBCUTANEOUS | 3 refills | Status: DC
Start: 2022-10-25 — End: 2022-11-20

## 2022-10-25 NOTE — Telephone Encounter (Signed)
Patient called and reported her vision has a glare/cloudy appearance and wonder if this is from starting insulin.  States it is hard for her to read small print.   She reports her blood sugars have improved with some AM readings in the 100s.  She also has readings in the 200s but denies > 200s readings at this time.   We discussed this could be from rapid change in blood sugar and she should anticipate that her vision should slowly recover.    Her current dose of insulin is Lantus (insulin glargine) 20 units in the AM.  We agreed to reduce her dose to 18 units to avoid lowering blood sugar too rapidly.  I asked her to contact us if her vision worsens as it is more likely that it should gradually improve now that her blood sugars are improved and stabilizing.   She accepted the plan and understands this should resolve slowly over the next several days to weeks.

## 2022-10-30 ENCOUNTER — Ambulatory Visit (INDEPENDENT_AMBULATORY_CARE_PROVIDER_SITE_OTHER): Payer: Medicare HMO | Admitting: Student

## 2022-10-30 VITALS — BP 141/44 | HR 53 | Ht 61.0 in | Wt 106.6 lb

## 2022-10-30 DIAGNOSIS — B029 Zoster without complications: Secondary | ICD-10-CM | POA: Diagnosis not present

## 2022-10-30 DIAGNOSIS — I10 Essential (primary) hypertension: Secondary | ICD-10-CM

## 2022-10-30 DIAGNOSIS — H538 Other visual disturbances: Secondary | ICD-10-CM | POA: Diagnosis not present

## 2022-10-30 DIAGNOSIS — E1169 Type 2 diabetes mellitus with other specified complication: Secondary | ICD-10-CM

## 2022-10-30 MED ORDER — METFORMIN HCL ER (MOD) 1000 MG PO TB24
1000.0000 mg | ORAL_TABLET | Freq: Every day | ORAL | 0 refills | Status: DC
Start: 2022-10-30 — End: 2022-11-20

## 2022-10-30 MED ORDER — ACYCLOVIR 800 MG PO TABS
800.0000 mg | ORAL_TABLET | ORAL | 0 refills | Status: AC
Start: 2022-10-30 — End: 2022-11-06

## 2022-10-30 MED ORDER — TRULICITY 0.75 MG/0.5ML ~~LOC~~ SOAJ
0.7500 mg | SUBCUTANEOUS | 3 refills | Status: DC
Start: 2022-10-30 — End: 2023-01-18

## 2022-10-30 NOTE — Patient Instructions (Addendum)
It was great to see you today! Thank you for choosing [facility] for your primary care. Mallory Hayes was seen for blurry vision.  Today we addressed: - Metformin 1000 daily - I will message our staff about the trulicity, but I will send a prescription in for the medication  - We will call to try to get you in to see the eye doctor earlier  - Acyclovir 800 every 4 hours for seven days   If you haven't already, sign up for My Chart to have easy access to your labs results, and communication with your primary care physician.  I recommend that you always bring your medications to each appointment as this makes it easy to ensure you are on the correct medications and helps [place] not miss refills when you need them. Call the clinic at [phone] if your symptoms worsen or you have any concerns.  You should return to our clinic Return in about 2 weeks (around 11/13/2022). Please arrive 15 minutes before your appointment to ensure smooth check in process.  We appreciate your efforts in making this happen.  Thank you for allowing me to participate in your care, Alfredo Martinez, MD 10/30/2022, 3:43 PM PGY-2, [facility]

## 2022-10-30 NOTE — Progress Notes (Signed)
SUBJECTIVE:   CHIEF COMPLAINT / HPI:   Insulin Change  DM2 - When she saw Dr. Raymondo Band, had sugars in the 400s, vision was normal at that time - After her insulin change (Started on Lantus), noticed that her eyes feel dilated, blurriness and glaring of the vision - Change is present with glasses on, cannot see in the mirror  - CBGs range 100s-300s currently  - Was on Trulicity but could not afford when she was in the doughnut hole  - Last A1C >15  - Does not want to take insulin anymore as she is worried about her vision  - Has an optometry appt on Aug 13th  - Feels week and worn out now after the insulin, has completely stopped this now  - Still driving, feels as if she can see while driving    Per Dr. Macky Lower last note 10/18/22: - Increased dose of basal insulin Lantus (insulin glargine) from 14 to 20 units daily - Continued to hold SGLT2-I Jardiance (empagliflozin) 10 mg given her current elevated BG readings. Will consider re-initiation once A1c is reduced.  -Continued metformin 500 mg daily.   Took blood pressure medication right before coming to her visit.   Passively mentions concern for shingles to CMA after our visit, has rash on left forearm. Has had shingles before, never had Shingles vaccine  PERTINENT  PMH / PSH:  HLD DM2 HTN OSA  Patient Care Team: [name], MD as PCP - General (Family Medicine) Chilton Si, MD as PCP - Cardiology (Cardiology) Linna Darner, RD as Dietitian (Family Medicine) Sallye Lat, MD as Consulting Physician (Ophthalmology) Helane Gunther, DPM as Consulting Physician (Podiatry) Fleeta Emmer, RN as [facility] Care Management OBJECTIVE:  BP (!) 141/44   Pulse (!) 53   Ht 5\' 1"  (1.549 m)   Wt 106 lb 9.6 oz (48.4 kg)   SpO2 100%   BMI 20.14 kg/m  Physical Exam  General: Alert and oriented in no apparent distress Heart: Regular rate and rhythm with no murmurs appreciated Lungs: CTA bilaterally,  no wheezing Skin: Warm and dry, patch of ~5 vesicular lesions on inner forearm  HEENT: PERRLA/EOMI  ASSESSMENT/PLAN:  Type 2 diabetes mellitus with other specified complication, without long-term current use of insulin (HCC) Assessment & Plan: Does not appear to be a side effect of insulin but likely secondary to large fluctuations in sugar levels. No true hypoglycemic episodes. We were able to have her scheduled with Dr. Dione Booze on 10/31/22 for assessment. Has glasses, may need a change in rx.   Because she no longer wants to use insulin, will order Trulicity again and message Durward Mallard about patient assistance. Increased extended release metformin. Discussed with Dr. Raymondo Band, will try to have her follow up with him again after further discussion with patient.   Orders: -     metFORMIN HCl ER (MOD); Take 1 tablet (1,000 mg total) by mouth daily with breakfast.  Dispense: 30 tablet; Refill: 0 -     Trulicity; Inject 0.75 mg into the skin once a week.  Dispense: 0.5 mL; Refill: 3  Blurry vision Assessment & Plan: Does not appear to be a side effect of insulin but likely secondary to large fluctuations in sugar levels. No true hypoglycemic episodes. We were able to have her scheduled with Dr. Dione Booze on 10/31/22 for assessment. Has glasses, may need a change in rx   Herpes zoster without complication Assessment & Plan: Acyclovir for shingles, discussed that she may experience pain  from this and to call me if she does have these symptoms.   Orders: -     Acyclovir; Take 1 tablet (800 mg total) by mouth every 4 (four) hours for 7 days.  Dispense: 42 tablet; Refill: 0  Essential hypertension Assessment & Plan: Wide pulse pressure, took bp medication right before our visit. Recheck on next visit.    Assessed rash in room with Dr. Miquel Dunn and discussed plan.   Return in about 2 weeks (around 11/13/2022). Alfredo Martinez, MD 10/31/2022, 11:58 PM PGY-2, [facility]

## 2022-10-31 DIAGNOSIS — H532 Diplopia: Secondary | ICD-10-CM | POA: Diagnosis not present

## 2022-10-31 DIAGNOSIS — H43811 Vitreous degeneration, right eye: Secondary | ICD-10-CM | POA: Diagnosis not present

## 2022-10-31 DIAGNOSIS — B029 Zoster without complications: Secondary | ICD-10-CM | POA: Insufficient documentation

## 2022-10-31 DIAGNOSIS — H538 Other visual disturbances: Secondary | ICD-10-CM | POA: Insufficient documentation

## 2022-10-31 DIAGNOSIS — E119 Type 2 diabetes mellitus without complications: Secondary | ICD-10-CM | POA: Diagnosis not present

## 2022-10-31 DIAGNOSIS — H5000 Unspecified esotropia: Secondary | ICD-10-CM | POA: Diagnosis not present

## 2022-10-31 DIAGNOSIS — H25813 Combined forms of age-related cataract, bilateral: Secondary | ICD-10-CM | POA: Diagnosis not present

## 2022-10-31 DIAGNOSIS — H5022 Vertical strabismus, left eye: Secondary | ICD-10-CM | POA: Diagnosis not present

## 2022-10-31 NOTE — Assessment & Plan Note (Signed)
Acyclovir for shingles, discussed that she may experience pain from this and to call me if she does have these symptoms.

## 2022-10-31 NOTE — Assessment & Plan Note (Addendum)
Does not appear to be a side effect of insulin but likely secondary to large fluctuations in sugar levels. No true hypoglycemic episodes. We were able to have her scheduled with Dr. Dione Booze on 10/31/22 for assessment. Has glasses, may need a change in rx.   Because she no longer wants to use insulin, will order Trulicity again and message Durward Mallard about patient assistance. Increased extended release metformin. Discussed with Dr. Raymondo Band, will try to have her follow up with him again after further discussion with patient.

## 2022-10-31 NOTE — Assessment & Plan Note (Signed)
Wide pulse pressure, took bp medication right before our visit. Recheck on next visit.

## 2022-10-31 NOTE — Assessment & Plan Note (Signed)
Does not appear to be a side effect of insulin but likely secondary to large fluctuations in sugar levels. No true hypoglycemic episodes. We were able to have her scheduled with Dr. Dione Booze on 10/31/22 for assessment. Has glasses, may need a change in rx

## 2022-11-02 ENCOUNTER — Ambulatory Visit (INDEPENDENT_AMBULATORY_CARE_PROVIDER_SITE_OTHER): Payer: Medicare HMO | Admitting: Pharmacist

## 2022-11-02 ENCOUNTER — Encounter: Payer: Self-pay | Admitting: Pharmacist

## 2022-11-02 VITALS — BP 132/53 | HR 54 | Wt 105.0 lb

## 2022-11-02 DIAGNOSIS — I119 Hypertensive heart disease without heart failure: Secondary | ICD-10-CM | POA: Diagnosis not present

## 2022-11-02 DIAGNOSIS — E1169 Type 2 diabetes mellitus with other specified complication: Secondary | ICD-10-CM | POA: Diagnosis not present

## 2022-11-02 NOTE — Assessment & Plan Note (Signed)
Hypertension longstanding and currently well controlled. Blood pressure goal of <130 mmHg and patient is asymptomatic with diastolic readings ~ 60 mmHg.   Medication adherence good.  - Continue olmesartan-amlodipine-hydrochlorothiazide daily at this time.

## 2022-11-02 NOTE — Progress Notes (Signed)
S:     Chief Complaint  Patient presents with   Medication Management    Diabetes - Blurry Vision   77 y.o. female who presents for diabetes evaluation, education, and management of diabetes with blurry vision following initiation of insulin therapy.    PMH is significant for recent initiation of insulin and use of Libre 3 CGM   Patient was referred and last seen by Primary Care Provider, Dr. Jena Gauss, on 10/30/2022.    At that visit three days ago, Lantus (insulin glargine) was discontinued - patient denies any use of insulin in 3 days.  Her Freestyle Libre 3 CGM has reached 14 days of use and patient is uninterested in continuing use.    Today, patient arrives with concern over her vision.  She presents without any assistance.     Current diabetes medications include:  metformin 1000 mg daily, and Trulicity (dulaglutide) 0.75mg  weekly. Jardiance 10 mg daily (on hold) and no longer taking insulin.  Current hypertension medications include: olmesartan/amlodipine/hydrochlorothiazide 40/10/25 mg daily Current hyperlipidemia medications include: rosuvastatin 20 mg daily   Insurance coverage: [facility] Medicare   Patient denies hypoglycemic events however reports a single reading of 60 mg/dl glucose prior to stopping insulin.    Patient reported dietary habits:    Patient-reported exercise habits: working in the garden, however has not worked outside in two weeks due to concern for her blood sugar and the hot weather.   O:   Review of Systems  Eyes:  Positive for blurred vision.    Physical Exam   LIbre 3 CGM Download: unavailable due to patient not bringing "reader" Patient reports readings in the high 200s to high 300s since stopping insulin.    Lab Results  Component Value Date   HGBA1C >15 (A) 09/21/2022   HGBA1C  09/21/2022     Comment:     Notified Dr Jennette Kettle and Melba Coon   Vitals:   11/02/22 1113  BP: (!) 132/53  Pulse: (!) 54  SpO2: 100%    Lipid Panel      Component Value Date/Time   CHOL 140 10/20/2021 1216   TRIG 75 10/20/2021 1216   HDL 70 10/20/2021 1216   CHOLHDL 2.0 10/20/2021 1216   CHOLHDL 1.8 12/02/2014 0952   VLDL 12 12/02/2014 0952   LDLCALC 55 10/20/2021 1216   LDLDIRECT 62 07/28/2012 0927    Clinical Atherosclerotic Cardiovascular Disease (ASCVD):  The 10-year ASCVD risk score (Arnett DK, et al., 2019) is: 28%   Values used to calculate the score:     Age: 40 years     Sex: Female     Is Non-Hispanic African American: Yes     Diabetic: Yes     Tobacco smoker: No     Systolic Blood Pressure: 132 mmHg     Is BP treated: Yes     HDL Cholesterol: 70 mg/dL     Total Cholesterol: 140 mg/dL    A/P: Diabetes longstanding with new onset blurry vision following initiation of insulin therapy.  Patient is currently uninterested in returning to the use of insulin. She was willing to discuss options with her PCP, Dr. Pollie Meyer, who was one of the attending preceptors of the day.  Patient referral to endocrinology planned after discussion between Dr. Pollie Meyer and patient.  - Continue to hold (not use)  basal insulin Lantus (insulin glargine) at this time per patient preference.  -Continued GLP-1 Trulicity (dulaglutide) 0.75mg  weekly.  -Continued to hold SGLT2-I Jardiance (empagliflozin) 10 mg  at this time given high readings and concern for uro-genital infection with current glucose control.   -Continued metformin 1000mg .  -Patient educated on purpose, proper use, and potential adverse effects.  -Extensively discussed pathophysiology of diabetes, recommended lifestyle interventions, dietary effects on blood sugar control.  Reviewed that vision would improve with glucose control but this would take several days to weeks.   Hypertension longstanding and currently well controlled. Blood pressure goal of <130 mmHg and patient is asymptomatic with diastolic readings ~ 60 mmHg.   Medication adherence good.  - Continue  olmesartan-amlodipine-hydrochlorothiazide daily at this time.   Written patient instructions provided. Patient verbalized understanding of treatment plan.  Total time in face to face counseling 26 minutes.    Follow-up:  Pharmacist PRN. PCP clinic visit in within the month.  Endocrinology referral placed by Dr. Pollie Meyer.

## 2022-11-02 NOTE — Progress Notes (Signed)
Reviewed and agree with Dr Koval's plan.   

## 2022-11-02 NOTE — Assessment & Plan Note (Signed)
Diabetes longstanding with new onset blurry vision following initiation of insulin therapy.  Patient is currently uninterested in returning to the use of insulin. She was willing to discuss options with her PCP, Dr. Pollie Meyer, who was one of the attending preceptors of the day.  Patient referral to endocrinology planned after discussion between Dr. Pollie Meyer and patient.  -Continue to hold (not use) basal insulin Lantus (insulin glargine) at this time per patient preference.  -Continued GLP-1 Trulicity (dulaglutide) 0.75mg  weekly.  -Continued to hold SGLT2-I Jardiance (empagliflozin) 10 mg at this time given high readings and concern for uro-genital infection with current glucose control.   -Continued metformin 1000mg .  -Patient educated on purpose, proper use, and potential adverse effects.  -Extensively discussed pathophysiology of diabetes, recommended lifestyle interventions, dietary effects on blood sugar control.  Reviewed that vision would improve with glucose control but this would take several days to weeks.

## 2022-11-02 NOTE — Progress Notes (Signed)
Spoke with patient briefly during her visit  She does not want any further treatment with insulin, blames insulin for her vision issues recently. Saw ophtho earlier this week - notes from that visit have been requested. Patient reports she needs cataract surgery but will not be able to get this until CBGs more controlled. Discussed with her that we are unlikely to make meaningful changes to her glycemic control without insulin.  She requested referral to endocrinology, which I am happy to do. Referral placed. She has an upcoming appointment with me scheduled in a few weeks. Patient appreciative.  [name], MD

## 2022-11-02 NOTE — Patient Instructions (Addendum)
It was nice to see you today!  Your goal blood sugar is for control to be in the 100s.     Medication Changes: Continue Trulicity and metformin.  We agreed that we will stop both insulin and your Jardiance for now.   Next visit with endocrinology.  Follow up with Dr. Pollie Meyer   Keep up the good work with diet and exercise. Aim for a diet full of vegetables, fruit and lean meats (chicken, [place], fish). Try to limit salt intake by eating fresh or frozen vegetables (instead of canned), rinse canned vegetables prior to cooking and do not add any additional salt to meals.

## 2022-11-05 ENCOUNTER — Other Ambulatory Visit (HOSPITAL_COMMUNITY): Payer: Self-pay

## 2022-11-05 ENCOUNTER — Ambulatory Visit: Payer: Self-pay

## 2022-11-05 NOTE — Patient Outreach (Signed)
  Care Coordination   11/05/2022 Name: Phillina Malinowski MRN: [id number] DOB: 1945/09/02   Care Coordination Outreach Attempts:  An unsuccessful telephone outreach was attempted today to offer the patient information about available care coordination services.  Follow Up Plan:  Additional outreach attempts will be made to offer the patient care coordination information and services.   Encounter Outcome:  No Answer   Care Coordination Interventions:  No, not indicated    Bary Leriche, RN, MSN [facility] Care Management Care Management Coordinator Direct Line [phone]

## 2022-11-06 ENCOUNTER — Telehealth: Payer: Self-pay

## 2022-11-06 NOTE — Patient Instructions (Signed)
Visit Information  Thank you for taking time to visit with me today. Please don't hesitate to contact me if I can be of assistance to you.   Following are the goals we discussed today:   Goals Addressed             This Visit's Progress    Management Diabetes and Blood Pressure       Patient Goals/Self Care Activities: -Patient/Caregiver will self-administer medications as prescribed as evidenced by self-report/primary caregiver report  -Patient/Caregiver will attend all scheduled provider appointments as evidenced by clinician review of documented attendance to scheduled appointments and patient/caregiver report -Patient/Caregiver will call provider office for new concerns or questions as evidenced by review of documented incoming telephone call notes and patient report  -Checks BP and records as discussed -Follows a low sodium diet/DASH diet -check blood sugar at prescribed times  Patient does not check sugars and blood pressure.  Discussed with patient the importance of blood sugar monitoring now that she is on insulin.      Patient A1c greater than 15.  Patient now on trulicity and metformin. Long conversation on diabetes and diet.  Encouraged sugar free drinks and limiting sweets.   She verbalized understanding.                 Our next appointment is by telephone on 12/03/22 at 930 am  Please call the care guide team at [phone] if you need to cancel or reschedule your appointment.   If you are experiencing a Mental Health or Behavioral Health Crisis or need someone to talk to, please call the Suicide and Crisis Lifeline: 988   The patient verbalized understanding of instructions, educational materials, and care plan provided today and agreed to receive a mailed copy of patient instructions, educational materials, and care plan.   The patient has been provided with contact information for the care management team and has been advised to call with any health related  questions or concerns.   Bary Leriche, RN, MSN [facility] Care Management Care Management Coordinator Direct Line [phone]

## 2022-11-06 NOTE — Patient Outreach (Signed)
  Care Coordination   Follow Up Visit Note   11/06/2022 Name: Mallory Hayes MRN: [id number] DOB: [birth year]  Mallory Hayes is a 77 y.o. year old female who sees Pollie Meyer, Estevan Ryder, MD for primary care. I spoke with  Mallory Hayes by phone today.  What matters to the patients health and wellness today?  Diabetes control    Goals Addressed             This Visit's Progress    Management Diabetes and Blood Pressure       Patient Goals/Self Care Activities: -Patient/Caregiver will self-administer medications as prescribed as evidenced by self-report/primary caregiver report  -Patient/Caregiver will attend all scheduled provider appointments as evidenced by clinician review of documented attendance to scheduled appointments and patient/caregiver report -Patient/Caregiver will call provider office for new concerns or questions as evidenced by review of documented incoming telephone call notes and patient report  -Checks BP and records as discussed -Follows a low sodium diet/DASH diet -check blood sugar at prescribed times  Patient does not check sugars and blood pressure.  Discussed with patient the importance of blood sugar monitoring now that she is on insulin.      Patient A1c greater than 15.  Patient now on trulicity and metformin. Long conversation on diabetes and diet.  Encouraged sugar free drinks and limiting sweets.   She verbalized understanding.                 SDOH assessments and interventions completed:  Yes     Care Coordination Interventions:  Yes, provided   Follow up plan: Follow up call scheduled for August    Encounter Outcome:  Pt. Visit Completed   Bary Leriche, RN, MSN [facility] Care Management Care Management Coordinator Direct Line [phone]

## 2022-11-07 LAB — HM DIABETES EYE EXAM

## 2022-11-08 ENCOUNTER — Telehealth: Payer: Self-pay | Admitting: Student

## 2022-11-08 NOTE — Telephone Encounter (Signed)
-----   Message from Otho Najjar sent at 11/05/2022 10:22 AM EDT ----- She picked up her Trulicity last week for $47. The patient assistance program for this medication is no longer accepting new applicants. The only other program with GLP1 assistance is novo nordisk (Ozempic, Victoza, and Rybelsus). Once approved, it takes about 3 weeks to ship to the office. ----- Message ----- From: Alfredo Martinez, MD Sent: 11/01/2022  12:00 AM EDT To: Otho Najjar, CPhT  Hey, is there a way to start patient assistance for GLP1 for this patient, she was previously in the doughnut hole for trulicity.

## 2022-11-08 NOTE — Telephone Encounter (Signed)
Spoke to patient via telephone call. Verified DOB prior to initiating conversation. Discussed switching from trulicity to Rybelsus through a patient assistance program as the trulicity is too expensive for her to afford. Discussed her concern about possible weight loss with GLP1 (patient is trying to maintain weight). Discussed using Ensure and possibly talk to Dr. Gerilyn Pilgrim with nutrition.   She reports additionally of "muscle spasms" in the head. She has had this happened before and reports that she has discussed with Dr. Pollie Meyer in the past. Told her I would forward to Dr. Pollie Meyer for assistance, has appt with her in a couple of weeks.   Alfredo Martinez, MD

## 2022-11-12 ENCOUNTER — Other Ambulatory Visit (HOSPITAL_COMMUNITY): Payer: Self-pay

## 2022-11-12 ENCOUNTER — Telehealth: Payer: Self-pay

## 2022-11-12 NOTE — Telephone Encounter (Signed)
-----   Message from [employer] sent at 11/12/2022 12:36 PM EDT ----- Let us start at lowest dose (I think is 3mg ) ----- Message ----- From: Otho Najjar, CPhT Sent: 11/12/2022  10:50 AM EDT To: Alfredo Martinez, MD  Sure, I will attempt to enroll her online.   Which strength would you like to do? Start with the 3mg  once daily? ----- Message ----- From: Alfredo Martinez, MD Sent: 11/08/2022   7:33 AM EDT To: Otho Najjar, CPhT  I believe she wants to do rybelsus. Can we start try to get her into the program? Thanks!   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 The patient assistance program for this medication is no longer accepting new applicants. The only other program with GLP1 assistance is novo nordisk (Ozempic, Victoza, and Rybelsus). Once approved, it takes about 3 weeks to ship to the office. ----- Message ----- From: Alfredo Martinez, MD Sent: 11/01/2022  12:00 AM EDT To: Otho Najjar, CPhT  Hey, is there a way to start patient assistance for GLP1 for this patient, she was previously in the doughnut hole for trulicity.

## 2022-11-12 NOTE — Telephone Encounter (Signed)
Submitted application for RYBELSUS 3MG /7MG  to [employer] for patient assistance.   Phone: [phone]

## 2022-11-15 ENCOUNTER — Encounter: Payer: Medicare HMO | Admitting: Family Medicine

## 2022-11-20 ENCOUNTER — Telehealth: Payer: Self-pay

## 2022-11-20 ENCOUNTER — Ambulatory Visit (INDEPENDENT_AMBULATORY_CARE_PROVIDER_SITE_OTHER): Payer: Medicare HMO | Admitting: Family Medicine

## 2022-11-20 ENCOUNTER — Encounter: Payer: Self-pay | Admitting: Family Medicine

## 2022-11-20 VITALS — BP 136/67 | HR 53 | Ht 61.0 in | Wt 102.2 lb

## 2022-11-20 DIAGNOSIS — M79642 Pain in left hand: Secondary | ICD-10-CM

## 2022-11-20 DIAGNOSIS — E785 Hyperlipidemia, unspecified: Secondary | ICD-10-CM

## 2022-11-20 DIAGNOSIS — H538 Other visual disturbances: Secondary | ICD-10-CM

## 2022-11-20 DIAGNOSIS — E1169 Type 2 diabetes mellitus with other specified complication: Secondary | ICD-10-CM | POA: Diagnosis not present

## 2022-11-20 DIAGNOSIS — R5383 Other fatigue: Secondary | ICD-10-CM

## 2022-11-20 DIAGNOSIS — M62838 Other muscle spasm: Secondary | ICD-10-CM

## 2022-11-20 NOTE — Telephone Encounter (Signed)
Patient LVM on nurse line requesting clarification on Jardiance.   She reports she was seen today, however is unsure if she is to continue Jardiance or stop.   Will forward to PCP.

## 2022-11-20 NOTE — Patient Instructions (Addendum)
It was great to see you again today.  Checking labs - kidneys, liver, cholesterol, inflammation, blood counts Referring to hand specialist for your finger  Call the endocrinologist's office to schedule an appointment - [phone]  It is important that we get your diabetes under control Let me know if you decide you want to restart insulin  Be well, Dr. Pollie Meyer

## 2022-11-20 NOTE — Telephone Encounter (Signed)
Received notification from [employer] regarding approval for OZEMPIC. Patient assistance approved from 11/14/22 to 04/30/23.  Medication will ship to office in 10-14 business days  Phone: [phone]

## 2022-11-20 NOTE — Telephone Encounter (Signed)
She should remain off jardiance for now. Please let patient know.  Thanks, Dr. Pollie Meyer

## 2022-11-20 NOTE — Progress Notes (Unsigned)
  Date of Visit: 11/20/2022   SUBJECTIVE:   HPI:  Mallory Hayes presents today for routine follow-up.  Diabetes: Currently taking metformin XR 500 mg daily and Mallory Hayes 0.75 mg once a week.  She is not on any insulin per her choosing, as she thinks insulin is what caused her vision to become blurry.  Mallory Hayes is currently on hold while her sugars have been running high.  Last A1c was over 15 at the end of May.  Has pending referral to endocrinology.  Vision issues: Continues to follow-up with ophthalmology.  Has an appointment on August 19 to discuss her cataracts.  Of note, ophthalmology has said they we are unable to do any procedures on her eyes until her sugars are better controlled.  Muscle spasm in head: Reports having some muscle spasm to the right side of her head about twice a week lasting a few hours at a time.  Reports having this in the past, tried baclofen for it years ago but did not like how that made her feel.  Laying down helps.  Does note she has been tired more lately.  Has not tried any medication for this recently.  Finger issue: Reports issue with her left index finger.  Requests referral to hand surgery for further evaluation.  Hyperlipidemia: Currently taking Mallory Hayes 20 mg daily.  She is not fasting today but is agreeable to checking labs.     OBJECTIVE:   BP 136/67 (BP Location: Left Arm, Patient Position: Sitting, Cuff Size: Normal)   Pulse (!) 53   Ht 5\' 1"  (1.549 m)   Wt 102 lb 3.2 oz (46.4 kg)   SpO2 100%   BMI 19.31 kg/m  Gen: No acute distress, pleasant, cooperative HEENT: Normocephalic, atraumatic, no significant tenderness to palpation of right head or temple Heart: Regular rate and rhythm, no murmur Lungs: Clear to auscultation bilaterally, normal effort Neuro: Grossly nonfocal, speech normal Ext: Some stiffness of left index finger, no warmth or erythema  ASSESSMENT/PLAN:   Type 2 diabetes mellitus (HCC) Uncontrolled.  Challenging as patient is  unwilling to resume insulin, though I think this is what is ultimately going to be necessary to get her sugars under control.  She is pending referral to endocrinology.  I provided her with the phone number for [facility] endocrinology so she can call and hopefully expedite getting an appointment.  Reviewed dangers of longstanding elevated blood sugars.  Has pending medication assistance for Mallory Hayes, which would help some if we can switch her from Mallory Hayes to Mallory Hayes.  Continue off Mallory Hayes while sugars are so elevated, we need to see these come down before we can safely resume it.  Dyslipidemia Update nonfasting lipids today, continue statin  Hand pain Persists, prior x-ray demonstrated osteoarthritis.  Referral to hand surgeon.  Blurry vision Continue follow-up with ophthalmology.  Checking ESR today given report of pain in her head, though low suspicion clinically for temporal arteritis.  Mallory J. Pollie Meyer, MD [facility]

## 2022-11-20 NOTE — Telephone Encounter (Signed)
Attempted to call patient, however I had to LVM.  Will await her return call.

## 2022-11-21 LAB — CMP14+EGFR
ALT: 22 IU/L (ref 0–32)
AST: 26 IU/L (ref 0–40)
Albumin: 4.4 g/dL (ref 3.8–4.8)
Alkaline Phosphatase: 75 IU/L (ref 44–121)
BUN/Creatinine Ratio: 24 (ref 12–28)
BUN: 37 mg/dL — ABNORMAL HIGH (ref 8–27)
Bilirubin Total: 0.5 mg/dL (ref 0.0–1.2)
CO2: 26 mmol/L (ref 20–29)
Calcium: 10.3 mg/dL (ref 8.7–10.3)
Chloride: 92 mmol/L — ABNORMAL LOW (ref 96–106)
Creatinine, Ser: 1.54 mg/dL — ABNORMAL HIGH (ref 0.57–1.00)
Globulin, Total: 2.8 g/dL (ref 1.5–4.5)
Glucose: 154 mg/dL — ABNORMAL HIGH (ref 70–99)
Potassium: 3 mmol/L — ABNORMAL LOW (ref 3.5–5.2)
Sodium: 135 mmol/L (ref 134–144)
Total Protein: 7.2 g/dL (ref 6.0–8.5)
eGFR: 35 mL/min/{1.73_m2} — ABNORMAL LOW (ref >59)

## 2022-11-21 LAB — LIPID PANEL
Chol/HDL Ratio: 2.1 ratio (ref 0.0–4.4)
Cholesterol, Total: 145 mg/dL (ref 100–199)
HDL: 68 mg/dL (ref >39)
LDL Chol Calc (NIH): 62 mg/dL (ref 0–99)
Triglycerides: 75 mg/dL (ref 0–149)
VLDL Cholesterol Cal: 15 mg/dL (ref 5–40)

## 2022-11-21 LAB — CBC
Hematocrit: 34.5 % (ref 34.0–46.6)
Hemoglobin: 11.8 g/dL (ref 11.1–15.9)
MCH: 32 pg (ref 26.6–33.0)
MCHC: 34.2 g/dL (ref 31.5–35.7)
MCV: 94 fL (ref 79–97)
Platelets: 185 10*3/uL (ref 150–450)
RBC: 3.69 x10E6/uL — ABNORMAL LOW (ref 3.77–5.28)
RDW: 12.9 % (ref 11.7–15.4)
WBC: 5.1 10*3/uL (ref 3.4–10.8)

## 2022-11-21 LAB — SEDIMENTATION RATE: Sed Rate: 23 mm/hr (ref 0–40)

## 2022-11-21 NOTE — Telephone Encounter (Signed)
Patient returns call to nurse line.   Discussed to remain off Jardiance and to FU in 3 months for next DM visit.   Patient was appreciative of call.

## 2022-11-22 NOTE — Assessment & Plan Note (Signed)
Uncontrolled.  Challenging as patient is unwilling to resume insulin, though I think this is what is ultimately going to be necessary to get her sugars under control.  She is pending referral to endocrinology.  I provided her with the phone number for [facility] endocrinology so she can call and hopefully expedite getting an appointment.  Reviewed dangers of longstanding elevated blood sugars.  Has pending medication assistance for Ozempic, which would help some if we can switch her from Trulicity to Ozempic.  Continue off Jardiance while sugars are so elevated, we need to see these come down before we can safely resume it.

## 2022-11-22 NOTE — Assessment & Plan Note (Addendum)
Continue follow-up with ophthalmology.  Checking ESR today given report of pain in her head, though low suspicion clinically for temporal arteritis.  Pain in head seems more consistent with muscle spasm, recommend trial of heat/ice/Tylenol.

## 2022-11-22 NOTE — Assessment & Plan Note (Signed)
Update nonfasting lipids today, continue statin

## 2022-11-22 NOTE — Assessment & Plan Note (Signed)
Persists, prior x-ray demonstrated osteoarthritis.  Referral to hand surgeon.

## 2022-11-27 ENCOUNTER — Telehealth: Payer: Self-pay

## 2022-11-27 NOTE — Telephone Encounter (Signed)
Patient returns call to nurse line. Advised of message per Dr. Jena Gauss.   Patient states that she will just continue Trulicity injections.   Veronda Prude, RN

## 2022-11-27 NOTE — Telephone Encounter (Signed)
Rec'd novo nordisk shipment of Rybelsus tablets.  Starting doses: 1 box of 3mg  tablets and 1 box of 7mg  tablets.  I havent reached out to patient yet since I seen telephone note from today. Let me know if I need to give her a call. Otherwise they are in a bag on the counter in the med room :)

## 2022-11-27 NOTE — Telephone Encounter (Signed)
Patient calls nurse line in regards to Trulicity.   She reports she finished one month of the medication. She denies any undesired side effects.   She reports she is unsure if she should continue taking this medication. She reports Jena Gauss was looking into a "pill form" for her.   She reports she would prefer this option, however is fine with continuing injections.   Will forward to PCP and [facility] for advisement.

## 2022-11-27 NOTE — Telephone Encounter (Signed)
**  CORRECTION  APPROVAL WAS FOR RYBELSUS TABLETS  Apologies!

## 2022-11-29 ENCOUNTER — Telehealth: Payer: Self-pay | Admitting: Family Medicine

## 2022-11-29 DIAGNOSIS — E876 Hypokalemia: Secondary | ICD-10-CM

## 2022-11-29 MED ORDER — POTASSIUM CHLORIDE CRYS ER 20 MEQ PO TBCR: 40.0000 meq | EXTENDED_RELEASE_TABLET | Freq: Every day | ORAL | 0 refills | Status: DC

## 2022-11-29 NOTE — Telephone Encounter (Signed)
Called patient to review labs from recent visit  Cr up some from prior, though last checked a year ago so may represent new baseline. Plan to recheck. K low, will send in supplement and recheck BMET. Visit scheduled for Wed of next week for recheck of lab (patient preferred to wait until then). Other labs unremarkable.  All questions answered, patient appreciative  [name], MD

## 2022-12-03 ENCOUNTER — Ambulatory Visit: Payer: Self-pay

## 2022-12-03 NOTE — Patient Outreach (Signed)
  Care Coordination   12/03/2022 Name: Mallory Hayes MRN: [id number] DOB: 08-29-1945   Care Coordination Outreach Attempts:  An unsuccessful telephone outreach was attempted today to offer the patient information about available care coordination services.  Follow Up Plan:  Additional outreach attempts will be made to offer the patient care coordination information and services.   Encounter Outcome:  No Answer   Care Coordination Interventions:  No, not indicated    Bary Leriche, RN, MSN [facility] Care Management Care Management Coordinator Direct Line [phone]

## 2022-12-04 ENCOUNTER — Telehealth: Payer: Self-pay

## 2022-12-04 NOTE — Patient Outreach (Signed)
  Care Coordination   12/04/2022 Name: Mallory Hayes MRN: [id number] DOB: 01-Nov-1945   Care Coordination Outreach Attempts:  A second unsuccessful outreach was attempted today to offer the patient with information about available care coordination services.  Follow Up Plan:  Additional outreach attempts will be made to offer the patient care coordination information and services.   Encounter Outcome:  No Answer   Care Coordination Interventions:  No, not indicated    Bary Leriche, RN, MSN [facility] Care Management Care Management Coordinator Direct Line [phone]

## 2022-12-05 ENCOUNTER — Other Ambulatory Visit: Payer: Medicare HMO

## 2022-12-05 ENCOUNTER — Telehealth: Payer: Self-pay

## 2022-12-05 DIAGNOSIS — E876 Hypokalemia: Secondary | ICD-10-CM | POA: Diagnosis not present

## 2022-12-05 NOTE — Patient Outreach (Signed)
  Care Coordination   12/05/2022 Name: Mallory Hayes MRN: [id number] DOB: [birth year]   Care Coordination Outreach Attempts:  A third unsuccessful outreach was attempted today to offer the patient with information about available care coordination services.  Follow Up Plan:  Additional outreach attempts will be made to offer the patient care coordination information and services.   Encounter Outcome:  No Answer   Care Coordination Interventions:  No, not indicated    Bary Leriche, RN, MSN [facility] Care Management Care Management Coordinator Direct Line [phone]

## 2022-12-07 ENCOUNTER — Encounter: Payer: Self-pay | Admitting: Family Medicine

## 2022-12-07 NOTE — Telephone Encounter (Addendum)
Attempted to call patient regarding medication. She did not answer, LVM asking that she return call to office.   *Spoke with Dr. Pollie Meyer regarding patient. She advised that patient could receive Rybelsus in place of Trulicity.   Veronda Prude, RN

## 2022-12-07 NOTE — Telephone Encounter (Signed)
Spoke with patient.   She reports she still has two pens left of Trulicity and does not want to waste them.   She reports she will call the office once she takes her last injection for Rybelsus pick up.

## 2022-12-17 DIAGNOSIS — H43811 Vitreous degeneration, right eye: Secondary | ICD-10-CM | POA: Diagnosis not present

## 2022-12-17 DIAGNOSIS — E119 Type 2 diabetes mellitus without complications: Secondary | ICD-10-CM | POA: Diagnosis not present

## 2022-12-17 DIAGNOSIS — H25813 Combined forms of age-related cataract, bilateral: Secondary | ICD-10-CM | POA: Diagnosis not present

## 2022-12-17 DIAGNOSIS — H5022 Vertical strabismus, left eye: Secondary | ICD-10-CM | POA: Diagnosis not present

## 2022-12-17 DIAGNOSIS — H532 Diplopia: Secondary | ICD-10-CM | POA: Diagnosis not present

## 2022-12-17 DIAGNOSIS — H5000 Unspecified esotropia: Secondary | ICD-10-CM | POA: Diagnosis not present

## 2022-12-18 ENCOUNTER — Telehealth: Payer: Self-pay

## 2022-12-18 NOTE — Patient Outreach (Signed)
  Care Coordination   12/18/2022 Name: Mallory Hayes MRN: [id number] DOB: [birth year]   Care Coordination Outreach Attempts:  A third unsuccessful outreach was attempted today to offer the patient with information about available care coordination services.  Follow Up Plan:  No further outreach attempts will be made at this time. We have been unable to contact the patient to offer or enroll patient in care coordination services  Encounter Outcome:  No Answer   Care Coordination Interventions:  No, not indicated    Bary Leriche, RN, MSN [facility] Care Management Care Management Coordinator Direct Line [phone]

## 2022-12-21 ENCOUNTER — Other Ambulatory Visit: Payer: Self-pay

## 2022-12-21 ENCOUNTER — Other Ambulatory Visit: Payer: Self-pay | Admitting: Family Medicine

## 2022-12-21 DIAGNOSIS — I10 Essential (primary) hypertension: Secondary | ICD-10-CM

## 2022-12-24 ENCOUNTER — Other Ambulatory Visit: Payer: Self-pay | Admitting: Family Medicine

## 2022-12-24 DIAGNOSIS — I10 Essential (primary) hypertension: Secondary | ICD-10-CM

## 2022-12-24 MED ORDER — OLMESARTAN-AMLODIPINE-HCTZ 40-10-25 MG PO TABS
1.0000 | ORAL_TABLET | Freq: Every day | ORAL | 2 refills | Status: DC
Start: 2022-12-24 — End: 2022-12-26

## 2022-12-26 ENCOUNTER — Ambulatory Visit: Payer: Medicare HMO | Admitting: Podiatry

## 2022-12-26 ENCOUNTER — Encounter: Payer: Self-pay | Admitting: Podiatry

## 2022-12-26 DIAGNOSIS — M79675 Pain in left toe(s): Secondary | ICD-10-CM | POA: Diagnosis not present

## 2022-12-26 DIAGNOSIS — E1169 Type 2 diabetes mellitus with other specified complication: Secondary | ICD-10-CM | POA: Diagnosis not present

## 2022-12-26 DIAGNOSIS — B351 Tinea unguium: Secondary | ICD-10-CM

## 2022-12-26 DIAGNOSIS — M79674 Pain in right toe(s): Secondary | ICD-10-CM | POA: Diagnosis not present

## 2022-12-26 NOTE — Progress Notes (Signed)
This patient returns to my office for at risk foot care.  This patient requires this care by a professional since this patient will be at risk due to having CKD stage 3 and type 2 diabetes.   This patient is unable to cut nails herself since the patient cannot reach her nails.These nails are painful walking and wearing shoes.  This patient presents for at risk foot care today.  General Appearance  Alert, conversant and in no acute stress.  Vascular  Dorsalis pedis and posterior tibial  pulses are palpable  bilaterally.  Capillary return is within normal limits  bilaterally. Temperature is within normal limits  bilaterally.  Neurologic  Senn-Weinstein monofilament wire test within normal limits  bilaterally. Muscle power within normal limits bilaterally.  Nails Thick disfigured discolored nails with subungual debris  from hallux to fifth toes bilaterally. No evidence of bacterial infection or drainage bilaterally.  Orthopedic  No limitations of motion  feet .  No crepitus or effusions noted.  No bony pathology or digital deformities noted.  HAV  B/L and tailors bunion  B/L.  Skin  normotropic skin with no porokeratosis noted bilaterally.  No signs of infections or ulcers noted.     Onychomycosis  Pain in right toes  Pain in left toes  Consent was obtained for treatment procedures.   Mechanical debridement of nails 1-5  bilaterally performed with a nail nipper.  Filed with dremel without incident.    Return office visit     3 months                 Told patient to return for periodic foot care and evaluation due to potential at risk complications.   Gardiner Barefoot DPM

## 2022-12-28 NOTE — Telephone Encounter (Signed)
Patient presents to clinic. Provided with medication per note from Camille.   Hannah C Pipkin, RN  

## 2023-01-04 ENCOUNTER — Ambulatory Visit: Payer: Medicare HMO | Admitting: Student

## 2023-01-04 NOTE — Progress Notes (Deleted)
  SUBJECTIVE:   CHIEF COMPLAINT / HPI:   Stomach Problems -Meds: Metfromin and Trulicity   PERTINENT  PMH / PSH: ***  Past Medical History:  Diagnosis Date   Diabetes mellitus without complication (HCC)    GERD (gastroesophageal reflux disease)    Hyperlipidemia    on meds   Hypertension    OSA (obstructive sleep apnea)    no cpap   Pure hypercholesterolemia 12/27/2021   Sleep apnea    OBJECTIVE:  There were no vitals taken for this visit. Physical Exam   ASSESSMENT/PLAN:  There are no diagnoses linked to this encounter. No follow-ups on file. Bess Kinds, MD 01/04/2023, 8:08 AM PGY-***, [facility] {    This will disappear when note is signed, click to select method of visit    :1}

## 2023-01-09 ENCOUNTER — Other Ambulatory Visit (INDEPENDENT_AMBULATORY_CARE_PROVIDER_SITE_OTHER): Payer: Medicare HMO

## 2023-01-09 ENCOUNTER — Ambulatory Visit: Payer: Medicare HMO | Admitting: Orthopedic Surgery

## 2023-01-09 DIAGNOSIS — M65322 Trigger finger, left index finger: Secondary | ICD-10-CM | POA: Diagnosis not present

## 2023-01-09 DIAGNOSIS — M79641 Pain in right hand: Secondary | ICD-10-CM

## 2023-01-09 DIAGNOSIS — M79642 Pain in left hand: Secondary | ICD-10-CM

## 2023-01-09 NOTE — Progress Notes (Signed)
Mallory Hayes - 77 y.o. female MRN [id number]  Date of birth: 1946/04/04  Office Visit Note: Visit Date: 01/09/2023 PCP: [name], MD Referred by: [name], MD  Subjective: No chief complaint on file.  HPI: Mallory Hayes is a pleasant 77 y.o. female who presents today for evaluation of left index finger triggering as well as right palmar skin thickness in line with the long finger that is been present for multiple months.  She is a poorly controlled diabetic, recent A1c is greater than 15.  No prior treatments for the bilateral hand issues.  Pertinent ROS were reviewed with the patient and found to be negative unless otherwise specified above in HPI.   Visit Reason: left index finger Hand dominance: left Occupation: retired  Diabetic: yes / >15 Heart/Lung History: none Blood Thinners: none  Prior Testing/EMG:none  Injections (Date):none  Treatments:none  Prior Surgery: none   Assessment & Plan: Visit Diagnoses:  1. Bilateral hand pain     Plan: Extensive discussion was had with the patient today regarding her left index finger trigger digit.  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  Given her recent poor diabetic control, I did counsel her on the importance of reducing her A1c level in order for her overall health but specifically for the trigger digits as well.  I explained to her treatment options ranging from conservative to surgical.  From a conservative standpoint, range of motion exercises, splinting, injections were all discussed.  We did discuss the cortisone injection in particular as well as the potential for elevating her sugars transiently as well.  We also discussed potential surgical treatment options in the form of trigger digit release, however we would want her A1c to be below 8.0  prior to surgical intervention in order to minimize perioperative complications.  Possibility of an injection for the left index finger was discussed today, however given her recent elevated sugars, she would like to obtain a more up-to-date A1c which is planned for October prior to injection which I am in agreement with.  She will return to me after her A1c is rechecked with hopefully a better result, at that juncture we can discuss potential injection for the left index finger should her symptoms remain at that point.  Follow-up: No follow-ups on file.   Meds & Orders: No orders of the defined types were placed in this encounter.   Orders Placed This Encounter  Procedures   XR Hand Complete Right   XR Hand Complete Left     Procedures: No procedures performed      Clinical History: No specialty comments available.  She reports that she has never smoked. She has never used smokeless tobacco.  Recent Labs    01/11/22 1137 09/21/22 1049  HGBA1C 7.6* >15*    Objective:   Vital Signs: There were no vitals taken for this visit.  Physical Exam  Gen: Well-appearing, in no acute distress; non-toxic CV: Regular Rate. Well-perfused. Warm.  Resp: Breathing unlabored on room air; no wheezing. Psych: Fluid speech in conversation; appropriate affect; normal thought process  Ortho Exam Left hand: - Tenderness at the A1 region index finger, small nodule appreciated - Stiffness of the index finger with deep flexion, improved passively with full range of motion - No significant PIP contracture appreciated to the index finger - Hand is  warm well-perfused, sensation intact distally  Right hand: - Full digital range of motion without significant triggering, no tenderness over the A1 pulleys - Notable area of skin thickening at the palmar aspect of the hand in line with long finger, no palpable nodules or cords appreciated  Imaging: XR Hand Complete Left  Result Date: 01/09/2023 X-rays  of the left hand multiple views were completed today X-rays demonstrate stable appearance of the radiocarpal and midcarpal articulations.  There is evidence of degenerative change at the thumb [facility] joint with joint subluxation and osteophyte formation.  There is also degenerative change noted to the DIP joints diffusely with joint space narrowing.  XR Hand Complete Right  Result Date: 01/09/2023 X-rays of the right hand, 3 views were obtained today X-rays demonstrate stable appearance of the radiocarpal and midcarpal articulations.  There is evidence of degenerative change at the thumb [facility] joint with joint subluxation and osteophyte formation.  There is also degenerative change noted to the DIP joints diffusely with joint space narrowing.   Past Medical/Family/Surgical/Social History: Medications & Allergies reviewed per EMR, new medications updated. 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   Aortic stenosis, mild 02/03/2019   Pain due to onychomycosis of toenails of both feet 10/21/2018   CRI (chronic renal insufficiency), stage 3 (moderate) 02/06/2018   Diastolic CHF (HCC) 02/06/2018   Posterior tibial tendon dysfunction 01/24/2018   Postmenopausal bleeding 05/17/2017   OSA (obstructive sleep apnea) 11/15/2015   Murmur, heart 05/29/2015   Family history of ovarian cancer 01/04/2014   Postmenopausal atrophic vaginitis 08/01/2012   Bradycardia 02/01/2012   Strain of right trapezius muscle 01/21/2012   Hot flashes 01/02/2012   Metatarsalgia of right foot 08/07/2011   Eye muscle weakness 01/11/2011   Esophageal reflux 09/01/2007   PERSONAL HISTORY OF COLONIC POLYPS 09/01/2007   Dyslipidemia 03/06/2007   Allergic rhinitis 12/27/2006   CONSTIPATION, CHRONIC 12/27/2006    MENOPAUSAL SYNDROME 12/27/2006   SYMPTOM, INCONTINENCE, MIXED, URGE/STRESS 12/27/2006   Type 2 diabetes mellitus (HCC) 11/15/2006   WEIGHT LOSS, ABNORMAL 10/22/2006   DEPRESSION, MAJOR, RECURRENT 06/27/2006   Anxiety state 06/27/2006   Essential hypertension 06/27/2006   Past Medical History:  Diagnosis Date   Diabetes mellitus without complication (HCC)    GERD (gastroesophageal reflux disease)    Hyperlipidemia    on meds   Hypertension    OSA (obstructive sleep apnea)    no cpap   Pure hypercholesterolemia 12/27/2021   Sleep apnea    Family History  Problem Relation Age of Onset   Colon cancer Mother 10   Ovarian cancer Mother    Other Father        brain tumor   Prostate cancer Brother    Hypertension Brother    Diabetes Brother    Thyroid disease Maternal Grandmother    Heart attack Maternal Grandfather    Past Surgical History:  Procedure Laterality Date   APPENDECTOMY     BREAST EXCISIONAL BIOPSY Right    benign   CHOLECYSTECTOMY     COLONOSCOPY  2011   PARTIAL HYSTERECTOMY  1991   POLYPECTOMY     Social History   Occupational History   Occupation: Retired  Tobacco Use   Smoking status: Never   Smokeless tobacco: Never  Vaping Use   Vaping  status: Never Used  Substance and Sexual Activity   Alcohol use: No   Drug use: No   Sexual activity: Not Currently    Partners: Male    Birth control/protection: Surgical    Comment: hysterectomy    Owen Pagnotta Fara Boros) Denese Killings, M.D. [facility] 10:06 AM

## 2023-01-10 ENCOUNTER — Encounter: Payer: Self-pay | Admitting: Dietician

## 2023-01-10 ENCOUNTER — Encounter: Payer: Medicare HMO | Attending: Family Medicine | Admitting: Dietician

## 2023-01-10 VITALS — Wt 102.0 lb

## 2023-01-10 DIAGNOSIS — Z713 Dietary counseling and surveillance: Secondary | ICD-10-CM | POA: Insufficient documentation

## 2023-01-10 DIAGNOSIS — E119 Type 2 diabetes mellitus without complications: Secondary | ICD-10-CM | POA: Diagnosis present

## 2023-01-10 DIAGNOSIS — E1169 Type 2 diabetes mellitus with other specified complication: Secondary | ICD-10-CM | POA: Diagnosis not present

## 2023-01-10 NOTE — Patient Instructions (Signed)
Call your doctor to discuss resuming insulin. Consider restarting your Mallory Hayes or checking your blood glucose with a meter daily.  Rethink what you drink.  Drink water and other beverages that contain no carbohydrates. Make your snack choices nutrition packed. Include small portion of protein with each meal and snack.

## 2023-01-10 NOTE — Progress Notes (Signed)
Diabetes Self-Management Education  Visit Type: First/Initial  Appt. Start Time: 1500 Appt. End Time: 1600  01/10/2023  [name], identified by name and date of birth, is a 77 y.o. female with a diagnosis of Diabetes: Type 2.   ASSESSMENT Patient is here today alone.  She was last seen by another RD in the office on 01/17/2021 and myself in 2020/02/04.  History includes Type 2 Diabetes 2007-02-04), OSA (not on c-pap now and states that she may get this reevaluated). HTN, HLD She is not checking her blood glucose.  Blood glucose 255 in the office today.   She does not want to resume insulin because she states that her vision got poor when she was on insulin.  She did not f/u with her eye doctor during this time and states that her vision has now returned to normal.   She states that she can call her MD to restart insulin after discussion today.  Sleeps about 5-6 hours per night and wakes feeling that she has "been in a train wreck".  Her sleep is broken (8:30-12:30am), naps at times. Medications include Rybelsus, Jardiance (discontinued), Metformin XR, vitamin E A1C >15% 09/21/22 increased from 7.6% 01/12/2023, eGFR 35 on 12/05/2022 She is to see an MD at [facility] in 02-04-23 She does not check her blood sugar as her numbers are high and this frightens her.  She does not want a Libre as the numbers worried her.  Discussed that not knowing the numbers does not mean they are not high.  Weight hx: 61" 102 lbs  01/10/2023 110 lb 03-Feb-2021 per patient 105 lbs 02/04/20 UBW 104 lbs Patient would like to regain to 120 lbs and has not weighed that since about Feb 04, 2004 per patient.   Allergies include:  Black pepper, spices (cinnamon, cloves, chili powder, others), hot peppers (hot/feverish) She has tried Glucerna and would like to try this again.   Patient lives alone. Her mother used to live with her but she passed away in 02-04-11.  Patient reports increased stress of living alone and with the stress of  the world.  She reports having support but is willing to enquire about counseling. She struggles with food insecurity.   She does get food stamps.  She states that she also doesn't eat the right types of food.  She states that if she eats cookies she will eat them continuously.  She also occasionally drinks a sugary food. She states she does not know what to do about her sweet cravings.  Weight 102 lb (46.3 kg). Body mass index is 19.27 kg/m.   Diabetes Self-Management Education - 01/10/23 1524       Visit Information   Visit Type First/Initial      Initial Visit   Diabetes Type Type 2    Date Diagnosed 2007-02-04    Are you currently following a meal plan? No    Are you taking your medications as prescribed? Yes      Health Coping   How would you rate your overall health? Good      Psychosocial Assessment   Patient Belief/Attitude about Diabetes Motivated to manage diabetes    What is the hardest part about your diabetes right now, causing you the most concern, or is the most worrisome to you about your diabetes?   Checking blood sugar   she is not currently checking   Self-care barriers None    Self-management support Doctor's office    Other persons present Patient  Patient Concerns Nutrition/Meal planning;Glycemic Control    Special Needs None    Preferred Learning Style No preference indicated    Learning Readiness Ready    How often do you need to have someone help you when you read instructions, pamphlets, or other written materials from your doctor or pharmacy? 1 - Never    What is the last grade level you completed in school? 12      Pre-Education Assessment   Patient understands the diabetes disease and treatment process. Needs Review    Patient understands incorporating nutritional management into lifestyle. Needs Review    Patient undertands incorporating physical activity into lifestyle. Needs Review    Patient understands using medications safely. Needs Review     Patient understands monitoring blood glucose, interpreting and using results Needs Review    Patient understands prevention, detection, and treatment of acute complications. Needs Review    Patient understands prevention, detection, and treatment of chronic complications. Needs Review    Patient understands how to develop strategies to address psychosocial issues. Needs Review    Patient understands how to develop strategies to promote health/change behavior. Needs Review      Complications   Last HgB A1C per patient/outside source 15 %   >15% 09/21/2022   How often do you check your blood sugar? 0 times/day (not testing)    Have you had a dilated eye exam in the past 12 months? Yes    Have you had a dental exam in the past 12 months? No    Are you checking your feet? Yes    How many days per week are you checking your feet? 7      Dietary Intake   Breakfast banana, oatmeal with artificial sweetener and half and half    Snack (morning) egg    Lunch chicken and rice soup (canned), broccoli, apple    Snack (afternoon) mandarine orange, PB and crackers    Dinner [employer] dinner, vegetables or fast food    Snack (evening) none    Beverage(s) water, regular gingerale 1-2 cans daily, 1% milk, occasional OJ      Activity / Exercise   Activity / Exercise Type Light (walking / raking leaves)    How many days per week do you exercise? 6    How many minutes per day do you exercise? 20    Total minutes per week of exercise 120      Patient Education   Previous Diabetes Education Yes (please comment)   2022   Disease Pathophysiology Definition of diabetes, type 1 and 2, and the diagnosis of diabetes    Healthy Eating Plate Method;Role of diet in the treatment of diabetes and the relationship between the three main macronutrients and blood glucose level;Meal options for control of blood glucose level and chronic complications.    Being Active Role of exercise on diabetes management, blood  pressure control and cardiac health.    Medications Reviewed patients medication for diabetes, action, purpose, timing of dose and side effects.    Monitoring Identified appropriate SMBG and/or A1C goals.;Daily foot exams;Yearly dilated eye exam    Diabetes Stress and Support Identified and addressed patients feelings and concerns about diabetes;Worked with patient to identify barriers to care and solutions;Role of stress on diabetes    Lifestyle and Health Coping Lifestyle issues that need to be addressed for better diabetes care      Individualized Goals (developed by patient)   Nutrition General guidelines for healthy choices and  portions discussed    Physical Activity Exercise 5-7 days per week;15 minutes per day    Medications take my medication as prescribed    Monitoring  Test my blood glucose as discussed    Problem Solving Eating Pattern;Addressing barriers to behavior change    Reducing Risk treat hypoglycemia with 15 grams of carbs if blood glucose less than 70mg /dL    Health Coping Ask for help with psychological, social, or emotional issues      Post-Education Assessment   Patient understands the diabetes disease and treatment process. Demonstrates understanding / competency    Patient understands incorporating nutritional management into lifestyle. Comprehends key points    Patient undertands incorporating physical activity into lifestyle. Comprehends key points    Patient understands using medications safely. Comphrehends key points    Patient understands monitoring blood glucose, interpreting and using results Comprehends key points    Patient understands prevention, detection, and treatment of acute complications. Comprehends key points    Patient understands prevention, detection, and treatment of chronic complications. Comprehends key points    Patient understands how to develop strategies to address psychosocial issues. Needs Review    Patient understands how to develop  strategies to promote health/change behavior. Needs Review      Outcomes   Expected Outcomes Demonstrated interest in learning but significant barriers to change    Future DMSE 4-6 wks    Program Status Not Completed             Individualized Plan for Diabetes Self-Management Training:   Learning Objective:  Patient will have a greater understanding of diabetes self-management. Patient education plan is to attend individual and/or group sessions per assessed needs and concerns.   Plan:   Patient Instructions  Call your doctor to discuss resuming insulin. Consider restarting your Josephine Igo or checking your blood glucose with a meter daily.  Rethink what you drink.  Drink water and other beverages that contain no carbohydrates. Make your snack choices nutrition packed. Include small portion of protein with each meal and snack.      Expected Outcomes:  Demonstrated interest in learning but significant barriers to change  Education material provided: My Plate and Snack sheet, food bank list  If problems or questions, patient to contact team via:  Phone  Future DSME appointment: 4-6 wks

## 2023-01-18 ENCOUNTER — Ambulatory Visit (HOSPITAL_BASED_OUTPATIENT_CLINIC_OR_DEPARTMENT_OTHER): Payer: Medicare HMO | Admitting: Family

## 2023-01-18 ENCOUNTER — Encounter (HOSPITAL_BASED_OUTPATIENT_CLINIC_OR_DEPARTMENT_OTHER): Payer: Self-pay | Admitting: Family

## 2023-01-18 VITALS — BP 118/70 | HR 57 | Ht 61.0 in | Wt 102.7 lb

## 2023-01-18 DIAGNOSIS — I5030 Unspecified diastolic (congestive) heart failure: Secondary | ICD-10-CM

## 2023-01-18 DIAGNOSIS — R001 Bradycardia, unspecified: Secondary | ICD-10-CM | POA: Diagnosis not present

## 2023-01-18 DIAGNOSIS — R011 Cardiac murmur, unspecified: Secondary | ICD-10-CM | POA: Diagnosis not present

## 2023-01-18 DIAGNOSIS — E782 Mixed hyperlipidemia: Secondary | ICD-10-CM

## 2023-01-18 DIAGNOSIS — Z8249 Family history of ischemic heart disease and other diseases of the circulatory system: Secondary | ICD-10-CM

## 2023-01-18 DIAGNOSIS — I10 Essential (primary) hypertension: Secondary | ICD-10-CM | POA: Diagnosis not present

## 2023-01-18 NOTE — Patient Instructions (Addendum)
Medication Instructions:  Continue your current medications.   *If you need a refill on your cardiac medications before your next appointment, please call your pharmacy*  Testing/Procedures: Your EKG today looked good  Your physician recommends that you return for lab work today: [facility]  Your physician has requested that you have cardiac CT. Cardiac computed tomography (CT) is a painless test that uses an x-ray machine to take clear, detailed pictures of your heart.Please follow instruction sheet as given.  Follow-Up: At [facility], you and your health needs are our priority.  As part of our continuing mission to provide you with exceptional heart care, we have created designated Provider Care Teams.  These Care Teams include your primary Cardiologist (physician) and Advanced Practice Providers (APPs -  Physician Assistants and Nurse Practitioners) who all work together to provide you with the care you need, when you need it.  We recommend signing up for the patient portal called "MyChart".  Sign up information is provided on this After Visit Summary.  MyChart is used to connect with patients for Virtual Visits (Telemedicine).  Patients are able to view lab/test results, encounter notes, upcoming appointments, etc.  Non-urgent messages can be sent to your provider as well.   To learn more about what you can do with MyChart, go to [email or web address].    Your next appointment:   1 year(s)  Provider:   Chilton Si, MD    Other Instructions     Your cardiac CT will be scheduled at one of the below locations:   [facility] [address] [place], [place] [address] [phone]  OR  [facility] [address] [place], [place] [address] [phone]  OR   [facility] [address] [place], [place] [address] [phone]  If scheduled at [facility], please arrive at  the [facility] and Children's Entrance (Entrance C2) of [facility] 30 minutes prior to test start time. You can use the FREE valet parking offered at entrance C (encouraged to control the heart rate for the test)  Proceed to the [facility] Radiology Department (first floor) to check-in and test prep.  All radiology patients and guests should use entrance C2 at [facility], accessed from [facility], even though the hospital's physical address listed is [address].    If scheduled at [facility] or [facility], please arrive 15 mins early for check-in and test prep.  There is spacious parking and easy access to the radiology department from the [facility] Heart and Vascular entrance. Please enter here and check-in with the desk attendant.   Please follow these instructions carefully (unless otherwise directed):  An IV will be required for this test and Nitroglycerin will be given.   On the Night Before the Test: Be sure to Drink plenty of water. Do not consume any caffeinated/decaffeinated beverages or chocolate 12 hours prior to your test. Do not take any antihistamines 12 hours prior to your test.  On the Day of the Test: Drink plenty of water until 1 hour prior to the test. Do not eat any food 1 hour prior to test. You may take your regular medications prior to the test.  Take metoprolol (Lopressor) two hours prior to test. If you take Furosemide/Hydrochlorothiazide/Spironolactone, please HOLD on the morning of the test. FEMALES- please wear underwire-free bra if available, avoid dresses & tight clothing  After the Test: Drink plenty of water. After receiving IV contrast, you may experience a mild flushed feeling. This is normal. On occasion, you may experience a mild rash up to 24 hours after the test. This is not dangerous. If this occurs, you can take Benadryl 25 mg and increase your fluid  intake. If you experience trouble breathing, this can be serious. If it is severe call 911 IMMEDIATELY. If it is mild, please call our office. If you take any of these medications: Glipizide/Metformin, Avandament, Glucavance, please do not take 48 hours after completing test unless otherwise instructed.  We will call to schedule your test 2-4 weeks out understanding that some insurance companies will need an authorization prior to the service being performed.   For more information and frequently asked questions, please visit our website : [email or web address]  For non-scheduling related questions, please contact the cardiac imaging nurse navigator should you have any questions/concerns: Cardiac Imaging Nurse Navigators Direct Office Dial: [phone]   For scheduling needs, including cancellations and rescheduling, please call [place], [phone].

## 2023-01-18 NOTE — Progress Notes (Signed)
Cardiology Office Note:  .   Date:  01/18/2023  ID:  Mallory Hayes, DOB 02-25-1946, MRN [id number] PCP: [name], MD  [facility] Providers Cardiologist:  Chilton Si, MD    History of Present Illness: .   Mallory Hayes is a 77 y.o. female with a hx of hypertension, hyperlipidemia, OSA on CPAP, DM2, mild aortic stenosis, bradycardia.   She was initially referred September 2017 with bradycardia which have been present for many years.  She was active without exertional symptoms.  Echo with EF 55 to 60%, grade 1 diastolic dysfunction.  Lexiscan Myoview July 2021 due to chest pain while doing yard work negative for ischemia.  Echo 01/2018 with LVEF 60 to 65%, grade 2 diastolic dysfunction, mild aortic stenosis.  Ambulatory monitor due to palpitations 01/2020 with occasional PAC and PVC.  She was switched to Tribenzor from lisinopril, amlodipine, furosemide.  However was concerned about side effects and was transition back to amlodipine, lisinopril, furosemide.  Her furosemide was transitioned to hydrochlorothiazide.    Seen 08/22/21 with BP not at goal and Chlorthalidone 12.5 mg initiated. Echo 09/2021 LVEF 60-65%, moderate LVH, aortic sclerosis without stenosis. She felt dizzy on chlorthalidone and was instead started on Carvedilol. She was last seen 12/20/21 with BP at goal and feeling well.   She presents today for follow-up. Enjoys working in her flower garden. Reports no shortness of breath nor dyspnea on exertion. Reports no chest pain, pressure, or tightness. No edema, orthopnea, PND. Reports no palpitations.  Has had more difficulty managing her diabetes and has upcoming visit with endocrinology to establish care.   ROS: Please see the history of present illness.    All other systems reviewed and are negative.   Studies Reviewed: Marland Kitchen   EKG Interpretation Date/Time:  Friday January 18 2023 10:52:05 EDT Ventricular Rate:  47 PR Interval:  138 QRS Duration:  80 QT  Interval:  442 QTC Calculation: 391 R Axis:   81  Text Interpretation: Sinus bradycardia When compared with ECG of 26-Dec-2020 16:31, Confirmed by Gillian Shields ([id number]) on 01/18/2023 11:23:10 AM    Cardiac Studies & Procedures     STRESS TESTS  MYOCARDIAL PERFUSION IMAGING 11/25/2019  Narrative  There were transient T wave changes in the inferolateral leads during infusion that rapidly reolved in recovery  Nuclear stress EF: 58%.  The left ventricular ejection fraction is normal (55-65%).  The study is normal.  This is a low risk study.   ECHOCARDIOGRAM  ECHOCARDIOGRAM COMPLETE 09/28/2021  Narrative ECHOCARDIOGRAM REPORT    Patient Name:   Mallory Hayes Date of Exam: 09/28/2021 Medical Rec #:  [id number]    Height:       61.0 in Accession #:    [id number]   Weight:       107.2 lb Date of Birth:  [birth year]    BSA:          1.449 m Patient Age:    75 years     BP:           125/70 mmHg Patient Gender: F            HR:           50 bpm. Exam Location:  Outpatient  Procedure: 2D Echo, Cardiac Doppler, Color Doppler and Limited Color Doppler  Indications:    I35.0 Nonrheumatic aortic (valve) stenosis  History:        Patient has prior history of Echocardiogram examinations, most recent 02/03/2018. CHF, Aortic Valve  Disease; Risk Factors:Hypertension, Sleep Apnea, Dyslipidemia, Diabetes, CKD stage 3 and Non-Smoker. Patient denies chest pain, SOB and leg edema.  Sonographer:    Carlos American RVT, RDCS (AE), RDMS Referring Phys: [phone] [facility]   Sonographer Comments: Suboptimal parasternal window. Tight rib spaces. IMPRESSIONS   1. GLS is borderline low. Left ventricular ejection fraction, by estimation, is 60 to 65%. The left ventricle has normal function. The left ventricle has no regional wall motion abnormalities. There is moderate left ventricular hypertrophy. Left ventricular diastolic parameters are indeterminate. The average left ventricular global  longitudinal strain is -17.0 %. 2. Right ventricular systolic function is normal. The right ventricular size is normal. Tricuspid regurgitation signal is inadequate for assessing PA pressure. 3. The mitral valve is grossly normal. No evidence of mitral valve regurgitation. 4. Aortic valve regurgitation is not visualized. Aortic valve sclerosis/calcification is present, without any evidence of aortic stenosis. 5. The inferior vena cava is normal in size with greater than 50% respiratory variability, suggesting right atrial pressure of 3 mmHg.  Comparison(s): No prior Echocardiogram.  FINDINGS Left Ventricle: GLS is borderline low. Left ventricular ejection fraction, by estimation, is 60 to 65%. The left ventricle has normal function. The left ventricle has no regional wall motion abnormalities. The average left ventricular global longitudinal strain is -17.0 %. The left ventricular internal cavity size was normal in size. There is moderate left ventricular hypertrophy. Left ventricular diastolic parameters are indeterminate.  Right Ventricle: The right ventricular size is normal. Right ventricular systolic function is normal. Tricuspid regurgitation signal is inadequate for assessing PA pressure.  Left Atrium: Left atrial size was normal in size.  Right Atrium: Right atrial size was normal in size.  Pericardium: There is no evidence of pericardial effusion.  Mitral Valve: The mitral valve is grossly normal. There is mild thickening of the mitral valve leaflet(s). No evidence of mitral valve regurgitation.  Tricuspid Valve: The tricuspid valve is normal in structure. Tricuspid valve regurgitation is not demonstrated.  Aortic Valve: Aortic valve regurgitation is not visualized. Aortic valve sclerosis/calcification is present, without any evidence of aortic stenosis. Aortic valve mean gradient measures 5.0 mmHg. Aortic valve peak gradient measures 11.6 mmHg. Aortic valve area, by VTI measures  1.36 cm.  Pulmonic Valve: The pulmonic valve was not well visualized. Pulmonic valve regurgitation is not visualized.  Aorta: The aortic root is normal in size and structure.  Venous: The inferior vena cava is normal in size with greater than 50% respiratory variability, suggesting right atrial pressure of 3 mmHg.  IAS/Shunts: No atrial level shunt detected by color flow Doppler.   LEFT VENTRICLE PLAX 2D LVOT diam:     1.60 cm     Diastology LV SV:         45          LV e' medial:    8.18 cm/s LV SV Index:   31          LV E/e' medial:  6.5 LVOT Area:     2.01 cm    LV e' lateral:   11.10 cm/s LV E/e' lateral: 4.8  LV Volumes (MOD)           2D Longitudinal Strain LV vol d, MOD A2C: 32.6 ml 2D Strain GLS Avg:     -17.0 % LV vol d, MOD A4C: 42.9 ml LV vol s, MOD A2C: 7.6 ml LV vol s, MOD A4C: 15.5 ml LV SV MOD A2C:     25.0 ml 3D  Volume EF: LV SV MOD A4C:     42.9 ml 3D EF:        76 % LV SV MOD BP:      27.8 ml LV EDV:       68 ml LV ESV:       16 ml LV SV:        52 ml  RIGHT VENTRICLE RV S prime:     11.30 cm/s TAPSE (M-mode): 2.5 cm  LEFT ATRIUM             Index        RIGHT ATRIUM           Index LA Vol (A2C):   24.8 ml 17.12 ml/m  RA Area:     10.10 cm LA Vol (A4C):   19.6 ml 13.53 ml/m  RA Volume:   20.60 ml  14.22 ml/m LA Biplane Vol: 23.0 ml 15.87 ml/m AORTIC VALVE                     PULMONIC VALVE AV Area (Vmax):    1.29 cm      PV Vmax:       0.97 m/s AV Area (Vmean):   1.36 cm      PV Peak grad:  3.8 mmHg AV Area (VTI):     1.36 cm AV Vmax:           170.00 cm/s AV Vmean:          103.000 cm/s AV VTI:            0.330 m AV Peak Grad:      11.6 mmHg AV Mean Grad:      5.0 mmHg LVOT Vmax:         109.00 cm/s LVOT Vmean:        69.800 cm/s LVOT VTI:          0.223 m LVOT/AV VTI ratio: 0.68  AORTA Ao Root diam: 2.50 cm  MITRAL VALVE MV Area (PHT): 2.16 cm    SHUNTS MV Decel Time: 352 msec    Systemic VTI:  0.22 m MV E velocity: 53.00 cm/s   Systemic Diam: 1.60 cm MV A velocity: 79.60 cm/s MV E/A ratio:  0.67  [place] signed by Carolan Clines Signature Date/Time: 09/28/2021/3:49:38 PM    Final    MONITORS  LONG TERM MONITOR (3-14 DAYS) 02/22/2020  Narrative 3-day ZIO monitor  Quality: Fair.  Baseline artifact. Predominant rhythm: Sinus bradycardia Average heart rate: 55 bpm Max heart rate: 139 bpm Min heart rate: 34 bpm Pauses >2.5 seconds: None  Occasional PVCs and PACs.  Patient did submit a symptom diary.  She pushed the button twice at which time sinus rhythm was noted.   Tiffany C. Duke Salvia, MD, [facility] 03/22/2020 4:53 PM           Risk Assessment/Calculations:             Physical Exam:   VS:  BP 118/70 (BP Location: Left Arm, Patient Position: Sitting, Cuff Size: Normal)   Pulse (!) 57   Ht 5\' 1"  (1.549 m)   Wt 102 lb 11.2 oz (46.6 kg)   SpO2 97%   BMI 19.40 kg/m    Wt Readings from Last 3 Encounters:  01/18/23 102 lb 11.2 oz (46.6 kg)  01/10/23 102 lb (46.3 kg)  11/20/22 102 lb 3.2 oz (46.4 kg)    GEN: Well nourished, well developed in no acute  distress NECK: No JVD; No carotid bruits CARDIAC: RRR, no murmurs, rubs, gallops RESPIRATORY:  Clear to auscultation without rales, wheezing or rhonchi  ABDOMEN: Soft, non-tender, non-distended EXTREMITIES:  No edema; No deformity   ASSESSMENT AND PLAN: .    Mild aortic stenosis - Echo 09/2021 normal LVEF 60-65%, no AS. No indication for repeat monitoring at this time.  HTN - BP well controlled. Continue current antihypertensive regimen  Olmesartan-Amlodipine-hydrochlorothiazide 40-10-24mg  daily and Coreg 3.125mg  BID.   OSA - unable to tolerate CPAP.   DM2 - Following with nutrition and endocrinology. Has had difficulty with labile glycemic control.   Bradycardia - Asymptomatic with no lightheadedness, dizziness. Continue Coreg 3.125mg  BID   Family history of cardiovascular disease / HLD -given family history of  cardiovascular disease with risk factors including DM 2, HLD plan for cardiac CTA.  BMP today, if GFR greater than 35 point for CTA if GFR less than 35 plan to transition to coronary calcium score.   The 10-year ASCVD risk score (Arnett DK, et al., 2019) is: 26.2% . Continue Aspirin 81mg  daily, carvedilol, Crestor 20mg  daily.        Dispo: follow up in 1 year  Signed, Alver Sorrow, NP

## 2023-01-19 LAB — BASIC METABOLIC PANEL
BUN/Creatinine Ratio: 22 (ref 12–28)
BUN: 29 mg/dL — ABNORMAL HIGH (ref 8–27)
CO2: 25 mmol/L (ref 20–29)
Calcium: 10.3 mg/dL (ref 8.7–10.3)
Chloride: 99 mmol/L (ref 96–106)
Creatinine, Ser: 1.32 mg/dL — ABNORMAL HIGH (ref 0.57–1.00)
Glucose: 172 mg/dL — ABNORMAL HIGH (ref 70–99)
Potassium: 4.4 mmol/L (ref 3.5–5.2)
Sodium: 139 mmol/L (ref 134–144)
eGFR: 42 mL/min/{1.73_m2} — ABNORMAL LOW (ref >59)

## 2023-01-22 ENCOUNTER — Telehealth (HOSPITAL_BASED_OUTPATIENT_CLINIC_OR_DEPARTMENT_OTHER): Payer: Self-pay

## 2023-01-22 NOTE — Telephone Encounter (Addendum)
Called results to patient and left results on VM (ok per DPR), instructions left to call office back if patient has any questions!     ----- Message from Alver Sorrow sent at 01/21/2023  2:08 PM EDT ----- Kidney function improving from previous.  Good result!

## 2023-02-06 ENCOUNTER — Ambulatory Visit: Payer: Medicare HMO | Admitting: Obstetrics and Gynecology

## 2023-02-14 ENCOUNTER — Telehealth: Payer: Self-pay

## 2023-02-14 DIAGNOSIS — H43811 Vitreous degeneration, right eye: Secondary | ICD-10-CM | POA: Diagnosis not present

## 2023-02-14 DIAGNOSIS — E119 Type 2 diabetes mellitus without complications: Secondary | ICD-10-CM | POA: Diagnosis not present

## 2023-02-14 DIAGNOSIS — H524 Presbyopia: Secondary | ICD-10-CM | POA: Diagnosis not present

## 2023-02-14 NOTE — Telephone Encounter (Signed)
Patient calls nurse line regarding Rybelsus. She reports that she started having GI upset after titrating to 7 mg dosage.   She reports having abdominal pain and small amounts of diarrhea. She would like to remain on the 3 mg for now, as she was tolerating this well.   She has not been taking medication for about one week due to running out of 3 mg.   Will forward to PCP for further advisement.   Will also forward to [place], as patient was receiving this medication through [facility].   Veronda Prude, RN

## 2023-02-16 ENCOUNTER — Other Ambulatory Visit: Payer: Self-pay | Admitting: Family Medicine

## 2023-02-18 NOTE — Telephone Encounter (Signed)
I am fine with her staying on 3mg  She has an appointment with endocrine in November and hopefully they will be able to make some headway with getting her diabetes under control  Thanks [name], MD

## 2023-02-20 ENCOUNTER — Other Ambulatory Visit (HOSPITAL_COMMUNITY): Payer: Self-pay

## 2023-02-20 NOTE — Telephone Encounter (Signed)
Patient returns call to nurse line to check status of receiving 3 mg Rybelsus.   Please advise.   Veronda Prude, RN

## 2023-02-21 ENCOUNTER — Encounter: Payer: Self-pay | Admitting: Dietician

## 2023-02-21 ENCOUNTER — Ambulatory Visit (INDEPENDENT_AMBULATORY_CARE_PROVIDER_SITE_OTHER): Payer: Medicare HMO | Admitting: Family Medicine

## 2023-02-21 ENCOUNTER — Encounter: Payer: Medicare HMO | Attending: Family Medicine | Admitting: Dietician

## 2023-02-21 VITALS — BP 132/70 | HR 52 | Ht 61.0 in | Wt 102.0 lb

## 2023-02-21 DIAGNOSIS — E1169 Type 2 diabetes mellitus with other specified complication: Secondary | ICD-10-CM | POA: Diagnosis not present

## 2023-02-21 DIAGNOSIS — Z713 Dietary counseling and surveillance: Secondary | ICD-10-CM | POA: Insufficient documentation

## 2023-02-21 DIAGNOSIS — E119 Type 2 diabetes mellitus without complications: Secondary | ICD-10-CM | POA: Diagnosis present

## 2023-02-21 LAB — POCT GLYCOSYLATED HEMOGLOBIN (HGB A1C): HbA1c, POC (controlled diabetic range): 11.8 % — AB (ref 0.0–7.0)

## 2023-02-21 MED ORDER — LANCETS MISC. MISC: 1.0000 | Freq: Three times a day (TID) | 0 refills | Status: AC

## 2023-02-21 MED ORDER — LANCET DEVICE MISC: 1.0000 | Freq: Three times a day (TID) | 0 refills | Status: AC

## 2023-02-21 MED ORDER — TRULICITY 0.75 MG/0.5ML ~~LOC~~ SOAJ: 0.7500 mg | SUBCUTANEOUS | 2 refills | Status: DC

## 2023-02-21 MED ORDER — BLOOD GLUCOSE TEST VI STRP: 1.0000 | ORAL_STRIP | Freq: Every day | 0 refills | Status: AC

## 2023-02-21 MED ORDER — BLOOD GLUCOSE MONITORING SUPPL DEVI
1.0000 | Freq: Three times a day (TID) | 0 refills | Status: DC
Start: 2023-02-21 — End: 2023-03-11

## 2023-02-21 NOTE — Progress Notes (Signed)
    SUBJECTIVE:   CHIEF COMPLAINT / HPI:   Brief history: 77 yo female presenting for diabetes follow up after being seen by diabetes education.  Medications:  - Metformin 500 mg daily  - Rybelsus 3mg  daily Not taking rybelsus per nutrition note- may need refill of 3 mg dose Has not wanted to take insulin in the past, felt it made her vision blurry  Today: She was seen at the nutritionist office where her blood sugar was taken and it was 525.  They called and made her an appointment to come in and be seen today so that she can discuss her diabetes management.  They also suggested that she should get a C-peptide level taken as she does not appear to fit the classic picture of diabetes type 2.  Discussed at length what her previous medications had been and what had worked for her in the past.  She reiterated her concerns that insulin would make her eyesight worse, and stated that her eye doctor had also said that insulin can make her eye slightly worse as well.  PERTINENT  PMH / PSH: Diabetes mellitus  OBJECTIVE:   BP 132/70   Pulse (!) 52   Ht 5\' 1"  (1.549 m)   Wt 102 lb (46.3 kg)   SpO2 100%   BMI 19.27 kg/m   General: Thin, well-appearing.  No obvious distress Cardiac: RRR, no M/R/G Respiratory: CTAB, no increased work of breathing  ASSESSMENT/PLAN:   Type 2 diabetes mellitus (HCC) After lengthy discussion and shared decision making, decided to restart patient's Trulicity at 0.75 mg weekly.  No changes were made to her metformin, and patient agreed to blood sugar testing once daily first thing in the morning.  I also ordered a C-peptide to evaluate whether or not her body is still producing insulin.  Her A1c today was 11.8 which does represent a reduction from > 15 back in May.  Patient understands that if her body is no longer making endogenous insulin she will need to be started on an insulin regimen.  She is agreed to the plan and understands what the next steps will be.    [name], DO [facility]

## 2023-02-21 NOTE — Patient Instructions (Addendum)
Please keep your doctor's appointment today at 3:15 to help you with your blood glucose.  Begin checking your blood each day. Rethink what you drink.  Drink more water. Limit fried food

## 2023-02-21 NOTE — Assessment & Plan Note (Signed)
After lengthy discussion and shared decision making, decided to restart patient's Trulicity at 0.75 mg weekly.  No changes were made to her metformin, and patient agreed to blood sugar testing once daily first thing in the morning.  I also ordered a C-peptide to evaluate whether or not her body is still producing insulin.  Her A1c today was 11.8 which does represent a reduction from > 15 back in May.  Patient understands that if her body is no longer making endogenous insulin she will need to be started on an insulin regimen.  She is agreed to the plan and understands what the next steps will be.

## 2023-02-21 NOTE — Patient Instructions (Addendum)
It was wonderful to see you today!  Today we discussed your diabetes management.  You should continue to take your metformin as previously prescribed.  Please start taking Trulicity 0.75 mg weekly.  You may stop taking Rybelsus.  You should also start checking your blood sugar every morning before you eat anything.  Please write down the number every day and bring it with you to your next visit.  You should follow-up with your regular primary care doctor in about 3 weeks.  Please call [phone] with any questions about today's appointment.   If you need any additional refills, please call your pharmacy before calling the office.  [name], DO Family Medicine

## 2023-02-21 NOTE — Progress Notes (Signed)
Diabetes Self-Management Education  Visit Type: Follow-up  Appt. Start Time: 1100 Appt. End Time: 1130  03/05/2023  Mallory Hayes, identified by name and date of birth, is a 77 y.o. female with a diagnosis of Diabetes: Type 2.   ASSESSMENT Patient is here today alone.  She was last seen by myself on 01/10/2023.  Blood glucose in the office is 524 today.  She is feeling a little sluggish this am.  Did not do her usual walk.  She is not checking her blood glucose and is off of Rybelsus at this time.  Called resident on staff and an appointment has been made for her to see a provider today at their office at 3:15.   History includes Type 2 Diabetes March 29, 2007), OSA (not on c-pap now and states that she may get this reevaluated). HTN, HLD  Sleeps about 5-6 hours per night and wakes feeling that she has "been in a train wreck".  Her sleep is broken (8:30-12:30am), naps at times. Medications include Rybelsus (currently not taking as she is taking as she did not tolerate the 7 mg dose and is waiting on the 3 mg dose prescription), Jardiance (discontinued), Metformin XR, vitamin E A1C 11.8 02/21/2023 decreased from >15% 09/21/22, eGFR 35 on 12/05/2022, C-peptide 5.1 (high) on 02/21/2023. She is to see an MD at [facility] in September She does not check her blood sugar as her numbers are high and this frightens her.  She does not want a Libre as the numbers worried her.  Discussed that not knowing the numbers does not mean they are not high.   Weight hx: 61" 103 lbs 02/21/2023 102 lbs  01/10/2023 110 lb 03-28-21 per patient 105 lbs 28-Mar-2020 UBW 104 lbs Patient would like to regain to 120 lbs and has not weighed that since about March 28, 2004 per patient.   Allergies include:  Black pepper, spices (cinnamon, cloves, chili powder, others), hot peppers (hot/feverish) She has tried Glucerna and would like to try this again.   Patient lives alone. Her mother used to live with her but she passed away in 03-29-2011.   Patient reports increased stress of living alone and with the stress of the world.  She reports having support but is willing to enquire about counseling. She struggles with food insecurity.   She does get food stamps.  She states that she also doesn't eat the right types of food.  She states that if she eats cookies she will eat them continuously.  She also occasionally drinks a sugary food. She states she does not know what to do about her sweet cravings. There were no vitals taken for this visit. There is no height or weight on file to calculate BMI.   Diabetes Self-Management Education - 03/05/23 1427       Visit Information   Visit Type Follow-up      Initial Visit   Diabetes Type Type 2      Psychosocial Assessment   Patient Belief/Attitude about Diabetes Defeat/Burnout    Self-management support Doctor's office;CDE visits    Other persons present Patient    Patient Concerns Nutrition/Meal planning;Medication;Problem Solving    Special Needs None    Preferred Learning Style No preference indicated    Learning Readiness Ready    How often do you need to have someone help you when you read instructions, pamphlets, or other written materials from your doctor or pharmacy? 1 - Never      Pre-Education Assessment   Patient understands  the diabetes disease and treatment process. Needs Review    Patient understands incorporating nutritional management into lifestyle. Needs Review    Patient undertands incorporating physical activity into lifestyle. Needs Review    Patient understands using medications safely. Needs Review    Patient understands monitoring blood glucose, interpreting and using results Needs Review    Patient understands prevention, detection, and treatment of acute complications. Needs Review    Patient understands prevention, detection, and treatment of chronic complications. Needs Review    Patient understands how to develop strategies to address psychosocial issues.  Needs Review    Patient understands how to develop strategies to promote health/change behavior. Needs Review      Complications   Last HgB A1C per patient/outside source 11.8 %   02/21/2023     Dietary Intake   Breakfast oatmeal with artificial sweetener, half and half, banana, ritz PB crackers, sliced apples    Lunch skipped    Dinner [place] pot pie    Snack (evening) cookie (shortbread)    Beverage(s) water, regular gingerale      Activity / Exercise   Activity / Exercise Type Light (walking / raking leaves)    How many days per week do you exercise? 5    How many minutes per day do you exercise? 20    Total minutes per week of exercise 100      Patient Education   Previous Diabetes Education Yes (please comment)   12/2022   Healthy Eating Meal options for control of blood glucose level and chronic complications.    Being Active Role of exercise on diabetes management, blood pressure control and cardiac health.    Medications Reviewed patients medication for diabetes, action, purpose, timing of dose and side effects.    Monitoring Taught/evaluated CGM (comment);Identified appropriate SMBG and/or A1C goals.    Acute complications Discussed and identified patients' prevention, symptoms, and treatment of hyperglycemia.    Chronic complications Identified and discussed with patient  current chronic complications    Diabetes Stress and Support Identified and addressed patients feelings and concerns about diabetes;Worked with patient to identify barriers to care and solutions      Individualized Goals (developed by patient)   Nutrition General guidelines for healthy choices and portions discussed    Physical Activity Exercise 5-7 days per week;15 minutes per day    Medications take my medication as prescribed    Monitoring  Consistenly use CGM;Test my blood glucose as discussed    Problem Solving Medication consistency;Eating Pattern    Reducing Risk treat hypoglycemia with 15 grams of  carbs if blood glucose less than 70mg /dL      Patient Self-Evaluation of Goals - Patient rates self as meeting previously set goals (% of time)   Nutrition 25 - 50% (sometimes)    Physical Activity >75% (most of the time)    Medications >75% (most of the time)    Monitoring < 25% (hardly ever/never)    Problem Solving and behavior change strategies  25 - 50% (sometimes)    Reducing Risk (treating acute and chronic complications) 25 - 50% (sometimes)    Health Coping 25 - 50% (sometimes)      Post-Education Assessment   Patient understands the diabetes disease and treatment process. Demonstrates understanding / competency    Patient understands incorporating nutritional management into lifestyle. Comprehends key points    Patient undertands incorporating physical activity into lifestyle. Comprehends key points    Patient understands using medications safely. Comphrehends key  points    Patient understands monitoring blood glucose, interpreting and using results Needs Review    Patient understands prevention, detection, and treatment of acute complications. Comprehends key points    Patient understands prevention, detection, and treatment of chronic complications. Comprehends key points    Patient understands how to develop strategies to address psychosocial issues. Needs Review    Patient understands how to develop strategies to promote health/change behavior. Needs Review      Outcomes   Expected Outcomes Demonstrated interest in learning but significant barriers to change    Future DMSE 2 months    Program Status Not Completed      Subsequent Visit   Since your last visit have you experienced any weight changes? No change             Individualized Plan for Diabetes Self-Management Training:   Learning Objective:  Patient will have a greater understanding of diabetes self-management. Patient education plan is to attend individual and/or group sessions per assessed needs and  concerns.   Plan:   Patient Instructions  Please keep your doctor's appointment today at 3:15 to help you with your blood glucose.  Begin checking your blood each day. Rethink what you drink.  Drink more water. Limit fried food     Expected Outcomes:  Demonstrated interest in learning but significant barriers to change  Education material provided:   If problems or questions, patient to contact team via:  Phone  Future DSME appointment: 2 months

## 2023-02-22 ENCOUNTER — Other Ambulatory Visit (HOSPITAL_COMMUNITY): Payer: Self-pay

## 2023-02-22 LAB — C-PEPTIDE: C-Peptide: 5.1 ng/mL — ABNORMAL HIGH (ref 1.1–4.4)

## 2023-02-22 NOTE — Telephone Encounter (Signed)
Was started on trulicity yesterday with Dr. Hyacinth Meeker, will not make changes right now Mallory Dodrill, MD

## 2023-02-26 ENCOUNTER — Other Ambulatory Visit: Payer: Self-pay

## 2023-02-26 DIAGNOSIS — E1165 Type 2 diabetes mellitus with hyperglycemia: Secondary | ICD-10-CM

## 2023-03-02 ENCOUNTER — Other Ambulatory Visit: Payer: Self-pay | Admitting: Family Medicine

## 2023-03-06 ENCOUNTER — Other Ambulatory Visit (INDEPENDENT_AMBULATORY_CARE_PROVIDER_SITE_OTHER): Payer: Medicare HMO

## 2023-03-06 DIAGNOSIS — E1165 Type 2 diabetes mellitus with hyperglycemia: Secondary | ICD-10-CM | POA: Diagnosis not present

## 2023-03-07 LAB — COMPREHENSIVE METABOLIC PANEL
ALT: 17 U/L (ref 0–35)
AST: 19 U/L (ref 0–37)
Albumin: 4.3 g/dL (ref 3.5–5.2)
Alkaline Phosphatase: 92 U/L (ref 39–117)
BUN: 40 mg/dL — ABNORMAL HIGH (ref 6–23)
CO2: 29 meq/L (ref 19–32)
Calcium: 10 mg/dL (ref 8.4–10.5)
Chloride: 92 meq/L — ABNORMAL LOW (ref 96–112)
Creatinine, Ser: 1.77 mg/dL — ABNORMAL HIGH (ref 0.40–1.20)
GFR: 27.62 mL/min — ABNORMAL LOW (ref >60.00)
Glucose, Bld: 386 mg/dL — ABNORMAL HIGH (ref 70–99)
Potassium: 3.3 meq/L — ABNORMAL LOW (ref 3.5–5.1)
Sodium: 132 meq/L — ABNORMAL LOW (ref 135–145)
Total Bilirubin: 0.4 mg/dL (ref 0.2–1.2)
Total Protein: 7.4 g/dL (ref 6.0–8.3)

## 2023-03-07 LAB — LIPID PANEL
Cholesterol: 179 mg/dL (ref 0–200)
HDL: 76.8 mg/dL (ref >39.00)
LDL Cholesterol: 85 mg/dL (ref 0–99)
NonHDL: 102.64
Total CHOL/HDL Ratio: 2
Triglycerides: 86 mg/dL (ref 0.0–149.0)
VLDL: 17.2 mg/dL (ref 0.0–40.0)

## 2023-03-07 LAB — MICROALBUMIN / CREATININE URINE RATIO
Creatinine,U: 56.8 mg/dL
Microalb Creat Ratio: 1.8 mg/g (ref 0.0–30.0)
Microalb, Ur: 1 mg/dL (ref 0.0–1.9)

## 2023-03-08 ENCOUNTER — Telehealth: Payer: Self-pay

## 2023-03-08 ENCOUNTER — Other Ambulatory Visit (HOSPITAL_COMMUNITY): Payer: Self-pay

## 2023-03-08 DIAGNOSIS — E1169 Type 2 diabetes mellitus with other specified complication: Secondary | ICD-10-CM

## 2023-03-08 NOTE — Telephone Encounter (Signed)
Pharmacy Patient Advocate Encounter   Received notification from CoverMyMeds that prior authorization for [facility] VERIO FLEX SYSTEM W/ DEVICE KIT is required/requested.   Insurance verification completed.   The patient is insured through [employer] .   Per test claim:  ACCU-CHEK BRAND AND TRUE METRIX BRAND is preferred by the insurance.  If suggested medication is appropriate, Please send in a new RX and discontinue this one. If not, please advise as to why it's not appropriate so that we may request a Prior Authorization.

## 2023-03-11 MED ORDER — BLOOD GLUCOSE MONITORING SUPPL DEVI: 1.0000 | Freq: Three times a day (TID) | Status: AC

## 2023-03-11 MED ORDER — BLOOD GLUCOSE MONITORING SUPPL DEVI: 1.0000 | Freq: Three times a day (TID) | Status: DC

## 2023-03-11 NOTE — Telephone Encounter (Signed)
Reordered blood glucose monitoring supplies to reflect that patient's insurance prefers Accu-Chek or true Metrix brand.  Resent prescription to patient's pharmacy.

## 2023-03-14 ENCOUNTER — Encounter: Payer: Self-pay | Admitting: Podiatry

## 2023-03-14 ENCOUNTER — Ambulatory Visit: Payer: Self-pay | Admitting: Family Medicine

## 2023-03-19 ENCOUNTER — Ambulatory Visit: Payer: Medicare HMO | Admitting: "Endocrinology

## 2023-03-27 ENCOUNTER — Encounter: Payer: Self-pay | Admitting: Podiatry

## 2023-03-27 ENCOUNTER — Ambulatory Visit: Payer: Medicare HMO | Admitting: Podiatry

## 2023-03-27 DIAGNOSIS — B351 Tinea unguium: Secondary | ICD-10-CM | POA: Diagnosis not present

## 2023-03-27 DIAGNOSIS — M79674 Pain in right toe(s): Secondary | ICD-10-CM

## 2023-03-27 DIAGNOSIS — N183 Chronic kidney disease, stage 3 unspecified: Secondary | ICD-10-CM

## 2023-03-27 DIAGNOSIS — E1169 Type 2 diabetes mellitus with other specified complication: Secondary | ICD-10-CM

## 2023-03-27 DIAGNOSIS — M79675 Pain in left toe(s): Secondary | ICD-10-CM | POA: Diagnosis not present

## 2023-03-27 NOTE — Progress Notes (Signed)
This patient returns to my office for at risk foot care.  This patient requires this care by a professional since this patient will be at risk due to having CKD stage 3 and type 2 diabetes.   This patient is unable to cut nails herself since the patient cannot reach her nails.These nails are painful walking and wearing shoes.  This patient presents for at risk foot care today.  General Appearance  Alert, conversant and in no acute stress.  Vascular  Dorsalis pedis and posterior tibial  pulses are palpable  bilaterally.  Capillary return is within normal limits  bilaterally. Temperature is within normal limits  bilaterally.  Neurologic  Senn-Weinstein monofilament wire test within normal limits  bilaterally. Muscle power within normal limits bilaterally.  Nails Thick disfigured discolored nails with subungual debris  from hallux to fifth toes bilaterally. No evidence of bacterial infection or drainage bilaterally.  Orthopedic  No limitations of motion  feet .  No crepitus or effusions noted.  No bony pathology or digital deformities noted.  HAV  B/L and tailors bunion  B/L.  Skin  normotropic skin with no porokeratosis noted bilaterally.  No signs of infections or ulcers noted.     Onychomycosis  Pain in right toes  Pain in left toes  Consent was obtained for treatment procedures.   Mechanical debridement of nails 1-5  bilaterally performed with a nail nipper.  Filed with dremel without incident.    Return office visit     3 months                 Told patient to return for periodic foot care and evaluation due to potential at risk complications.   Helane Gunther DPM

## 2023-03-30 IMAGING — MG MM DIGITAL SCREENING BILAT W/ TOMO AND CAD
8 series · 9 of 24 positions shown · non-contrast
Comparison: Previous exam(s).

CLINICAL DATA: Screening.

EXAM:
DIGITAL SCREENING BILATERAL MAMMOGRAM WITH TOMOSYNTHESIS AND CAD
TECHNIQUE: Bilateral screening digital craniocaudal and mediolateral oblique
mammograms were obtained. Bilateral screening digital breast
tomosynthesis was performed. The images were evaluated with
computer-aided detection.

[R MLO synth-2D]
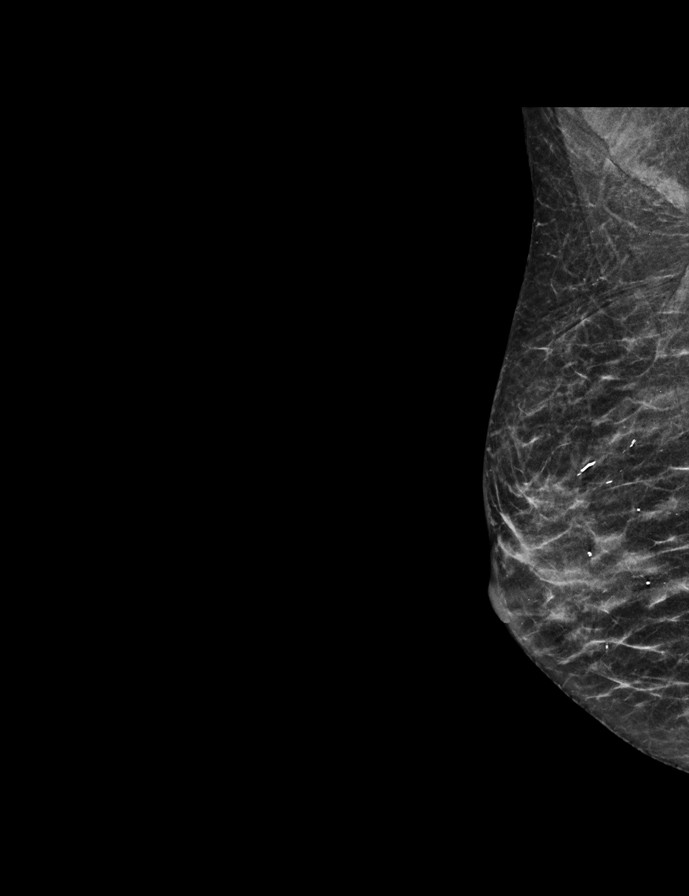

[R CC synth-2D]
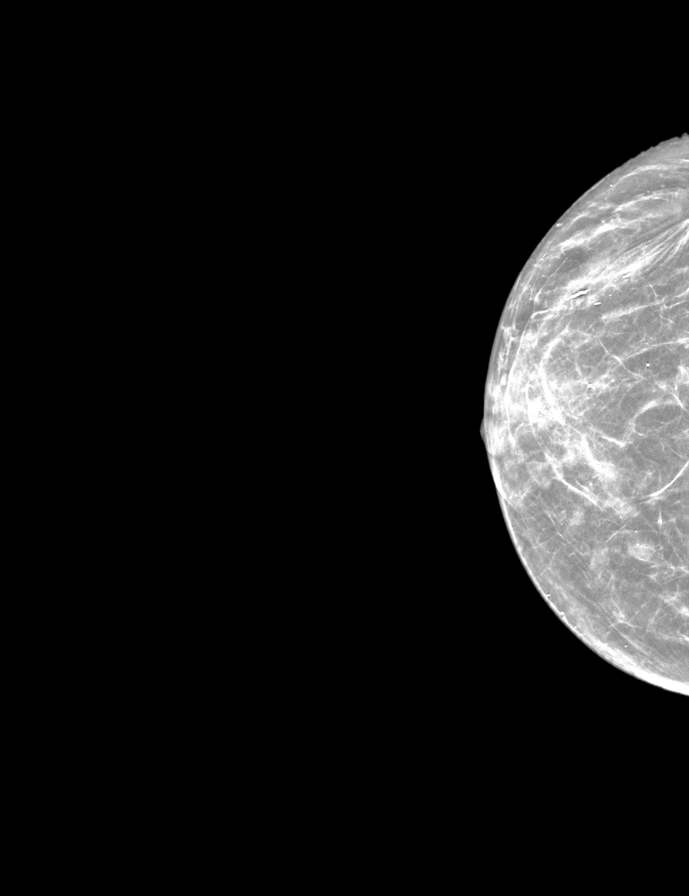

[L MLO synth-2D]
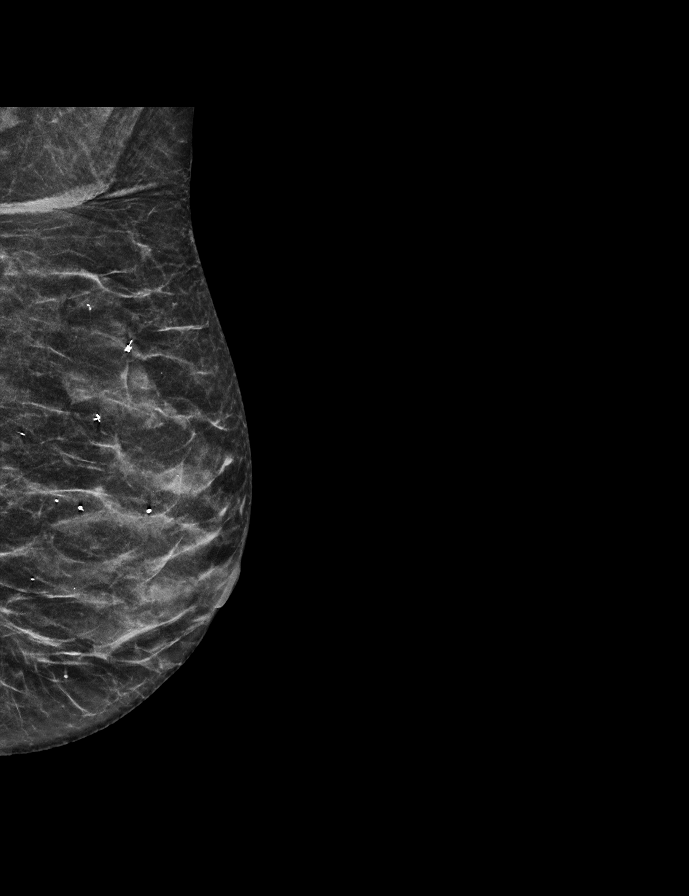

[L CC synth-2D]
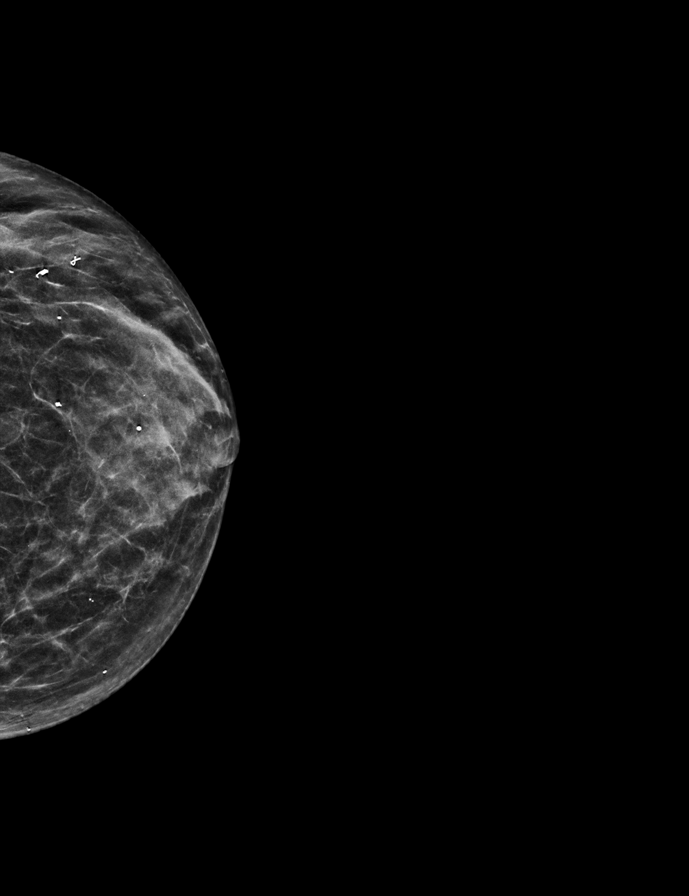

[R MLO tomo · 2 of 41 frames shown]
[frame 14/41]
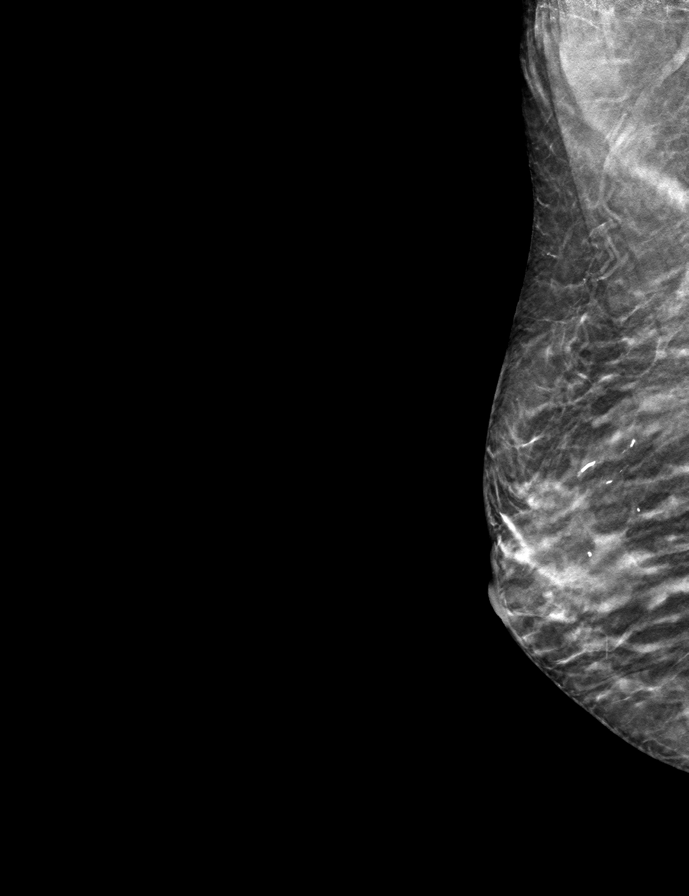
[frame 21/41]
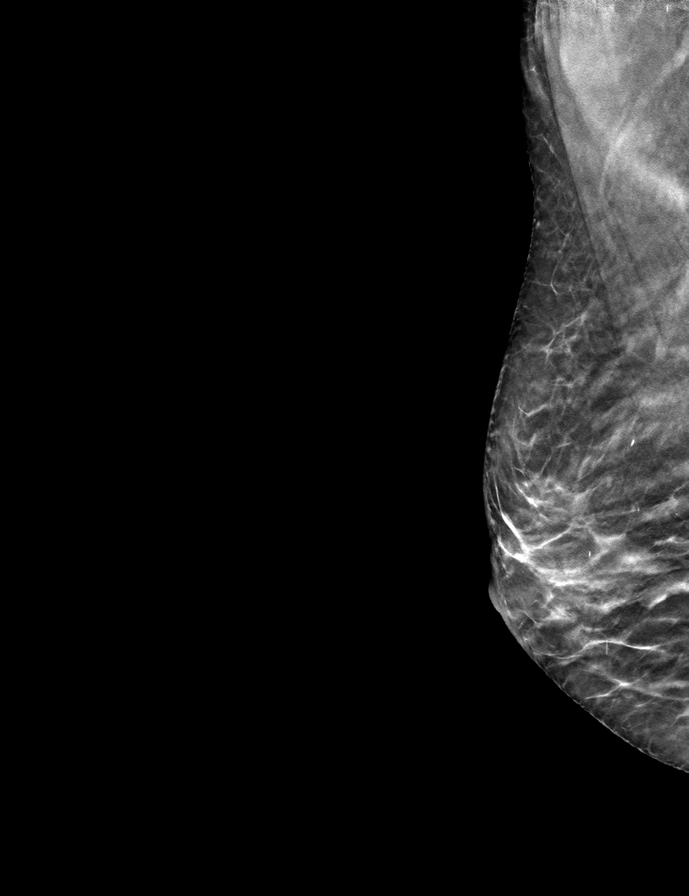

[L MLO tomo · tomo slice 22/43.0]
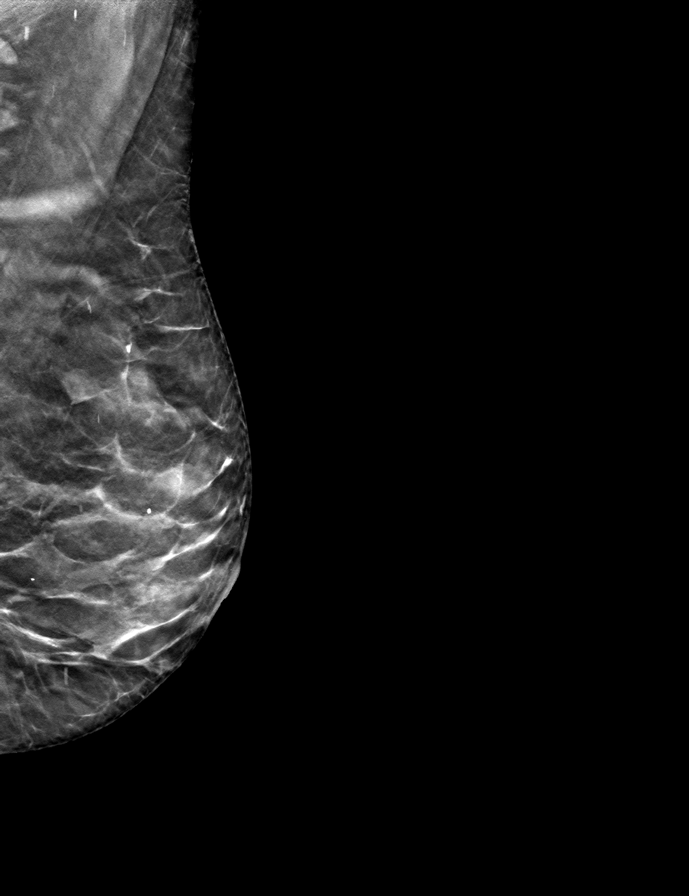

[L CC tomo · tomo slice 21/41.0]
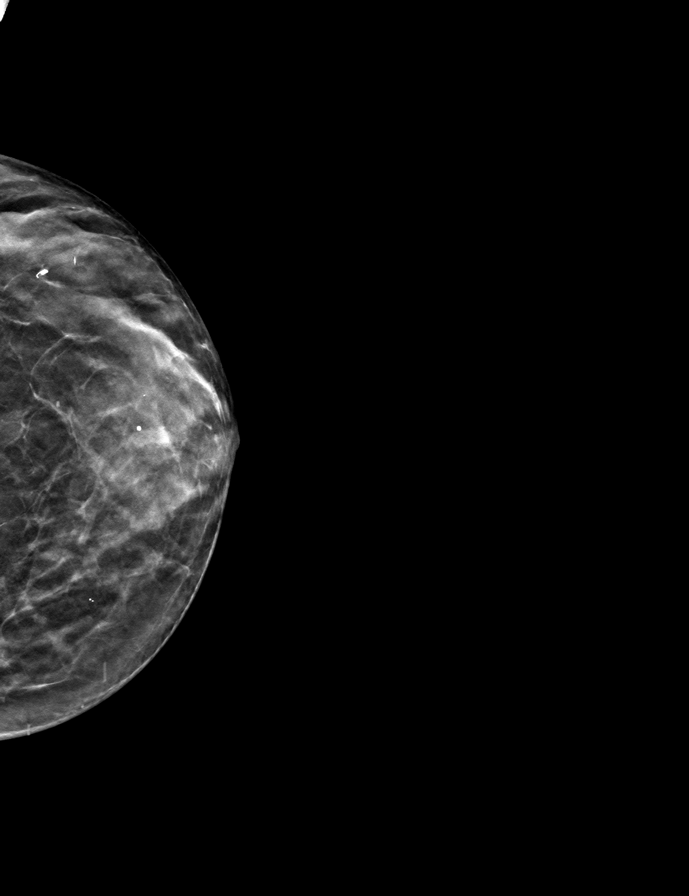

[R CC tomo · tomo slice 21/42.0]
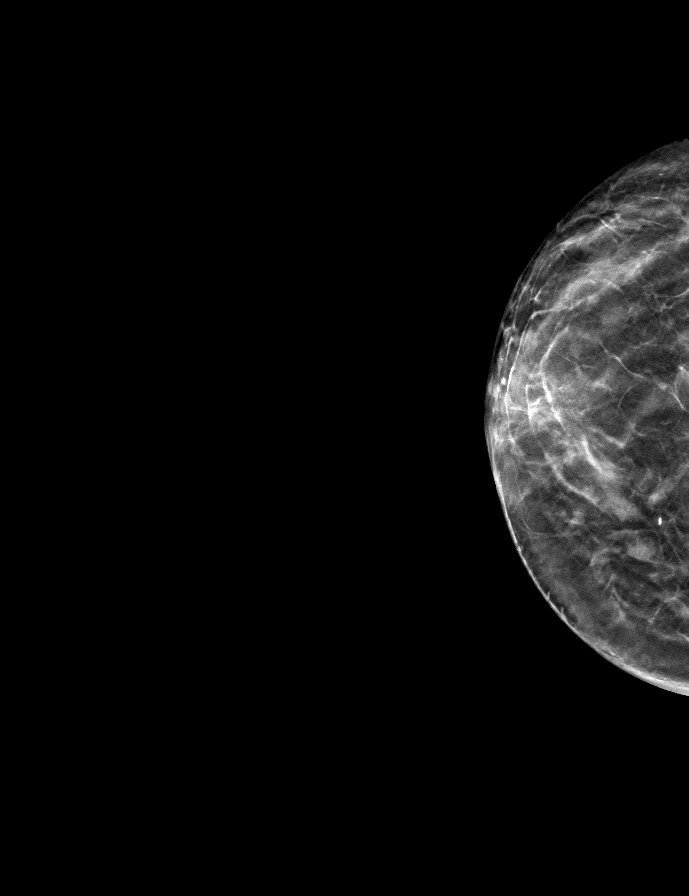

[9 of 24 positions shown; findings below may reference images not displayed]

ACR Breast Density Category c: The breast tissue is heterogeneously
dense, which may obscure small masses.
FINDINGS: There are no findings suspicious for malignancy. The images were
evaluated with computer-aided detection.
IMPRESSION: No mammographic evidence of malignancy. A result letter of this
screening mammogram will be mailed directly to the patient.

RECOMMENDATION:
Screening mammogram in one year. (Code:T4-5-GWO)

BI-RADS CATEGORY  1: Negative.

## 2023-04-03 ENCOUNTER — Other Ambulatory Visit: Payer: Self-pay | Admitting: Family Medicine

## 2023-04-03 ENCOUNTER — Ambulatory Visit (HOSPITAL_COMMUNITY): Payer: Medicare HMO

## 2023-04-04 ENCOUNTER — Other Ambulatory Visit (HOSPITAL_COMMUNITY): Payer: Self-pay | Admitting: Family

## 2023-04-04 DIAGNOSIS — Z8249 Family history of ischemic heart disease and other diseases of the circulatory system: Secondary | ICD-10-CM

## 2023-04-09 ENCOUNTER — Telehealth (HOSPITAL_BASED_OUTPATIENT_CLINIC_OR_DEPARTMENT_OTHER): Payer: Self-pay | Admitting: *Deleted

## 2023-04-09 NOTE — Telephone Encounter (Addendum)
Left message to call back   ----- Message from Alver Sorrow sent at 04/02/2023  2:56 PM EST ----- We can just switch it to a coronary calcium score (the $99 one). Would be nice if we could get it scheduled quickly - not sure why getting her CTA scheduled took so long (unless she requested delay). Calcium score doesn't use contrast so safe for kidneys ----- Message ----- From: Burnell Blanks, LPN Sent: 16/04/958  11:37 AM EST To: Alver Sorrow, NP  [place],   This is the one I asked you about, below message from [facility]!! caitlin is off until Wednesday I see. this patient is scheduled for CT on Wednesday. her most recent GFR is 27 so we can't do it without nephrology clearance. Luther Parody mentioned it in her note too.  6. Family history of cardiovascular disease / HLD -given family history of cardiovascular disease with risk factors including DM 2, HLD plan for cardiac CTA. BMP today, if GFR greater than 35 point for CTA if GFR less than 35 plan to transition to coronary calcium score. The 10-year ASCVD risk score (Arnett DK, et al., 2019) is: 26.2% . Continue Aspirin 81mg  daily, carvedilol, Crestor 20mg  daily.  looks like GFR was 42 when Caitlin checked but has decreased since  Just let me know what I need to do and I will take care of it   [facility]

## 2023-04-10 ENCOUNTER — Ambulatory Visit: Payer: Medicare HMO | Admitting: Obstetrics and Gynecology

## 2023-04-17 ENCOUNTER — Telehealth: Payer: Self-pay | Admitting: *Deleted

## 2023-04-17 NOTE — Progress Notes (Signed)
  04/17/2023 Name: Mallory Hayes MRN: [id number] DOB: 1945/08/08  [name] is a 77 y.o. year old female who is a primary care patient of Pollie Meyer, Estevan Ryder, MD . The community resource team was consulted for assistance with Food Insecurity  SDOH screenings and interventions completed:  No   patient called in to find out if we could update her moms meals as she is still waitlisted for [facility] , explained program had ended offered food resources patient declined said she would wait until [facility] moved her up the list   Care guide performed the following interventions: Patient provided with information about care guide support team and interviewed to confirm resource needs.  Follow Up Plan:  No further follow up planned at this time. The patient has been provided with needed resources.  Encounter Outcome:  Patient Visit Completed  [facility]  Population Health Careguide  Direct Dial: [phone] Website: [email or web address]

## 2023-04-19 ENCOUNTER — Other Ambulatory Visit (HOSPITAL_COMMUNITY): Payer: Self-pay

## 2023-04-19 ENCOUNTER — Ambulatory Visit: Payer: Medicare HMO | Admitting: Family Medicine

## 2023-04-19 NOTE — Progress Notes (Deleted)
    SUBJECTIVE:   CHIEF COMPLAINT / HPI:   Restarted Trulicity and instructed patient to take her blood sugars daily at previous exam. Should bring log to exam. Could consider pioglitazone if sugars are still high. Making plenty of endogenous insulin.  PERTINENT  PMH / PSH: ***  OBJECTIVE:   There were no vitals taken for this visit.  ***  ASSESSMENT/PLAN:   No problem-specific Assessment & Plan notes found for this encounter.     [name], DO [facility]

## 2023-04-26 NOTE — Telephone Encounter (Signed)
Left message to call back  Patient has been scheduled

## 2023-04-30 ENCOUNTER — Telehealth: Payer: Self-pay | Admitting: Cardiovascular Disease

## 2023-04-30 NOTE — Telephone Encounter (Signed)
Pt returning call to nurse

## 2023-04-30 NOTE — Telephone Encounter (Signed)
Left message for patient to call back  

## 2023-05-02 ENCOUNTER — Ambulatory Visit: Payer: Medicare HMO | Admitting: Dietician

## 2023-05-07 ENCOUNTER — Other Ambulatory Visit (HOSPITAL_COMMUNITY): Payer: Self-pay

## 2023-05-08 ENCOUNTER — Ambulatory Visit (HOSPITAL_COMMUNITY): Payer: Medicare HMO

## 2023-05-23 ENCOUNTER — Encounter: Payer: Medicare HMO | Admitting: Obstetrics and Gynecology

## 2023-05-27 ENCOUNTER — Telehealth: Payer: Self-pay

## 2023-05-27 DIAGNOSIS — E1169 Type 2 diabetes mellitus with other specified complication: Secondary | ICD-10-CM

## 2023-05-29 NOTE — Telephone Encounter (Signed)
Informed patient that Mallory Hayes is still not accepting new applications for Trulicity patient assistance. Patient cannot afford.  Patient is open to changing therapy and doesn't mind coming in if needed.

## 2023-05-31 NOTE — Telephone Encounter (Signed)
Can you contact patient and get her scheduled for an appointment with me to discuss?  Thanks [name], MD

## 2023-06-05 NOTE — Telephone Encounter (Signed)
 Left message to call back

## 2023-06-05 NOTE — Telephone Encounter (Signed)
 Pt returning call to a nurse

## 2023-06-06 ENCOUNTER — Other Ambulatory Visit (HOSPITAL_BASED_OUTPATIENT_CLINIC_OR_DEPARTMENT_OTHER): Payer: Medicare HMO

## 2023-06-10 NOTE — Telephone Encounter (Signed)
 3rd call attempt, no answer, left message

## 2023-06-13 ENCOUNTER — Other Ambulatory Visit: Payer: Self-pay | Admitting: Family Medicine

## 2023-06-13 DIAGNOSIS — I10 Essential (primary) hypertension: Secondary | ICD-10-CM

## 2023-06-14 ENCOUNTER — Telehealth: Payer: Self-pay

## 2023-06-14 ENCOUNTER — Other Ambulatory Visit: Payer: Self-pay | Admitting: Student

## 2023-06-14 ENCOUNTER — Ambulatory Visit (INDEPENDENT_AMBULATORY_CARE_PROVIDER_SITE_OTHER): Payer: Medicare Other | Admitting: Student

## 2023-06-14 VITALS — BP 146/42 | HR 63 | Ht 61.0 in | Wt 103.8 lb

## 2023-06-14 DIAGNOSIS — E1169 Type 2 diabetes mellitus with other specified complication: Secondary | ICD-10-CM

## 2023-06-14 DIAGNOSIS — R350 Frequency of micturition: Secondary | ICD-10-CM

## 2023-06-14 LAB — POCT UA - MICROSCOPIC ONLY
RBC, Urine, Miroscopic: NONE SEEN (ref 0–2)
WBC, Ur, HPF, POC: NONE SEEN (ref 0–5)

## 2023-06-14 LAB — POCT URINALYSIS DIP (CLINITEK)
Bilirubin, UA: NEGATIVE
Blood, UA: NEGATIVE
Glucose, UA: >=1000 mg/dL — AB
Ketones, POC UA: NEGATIVE mg/dL
Leukocytes, UA: NEGATIVE
Nitrite, UA: NEGATIVE
POC PROTEIN,UA: NEGATIVE
Spec Grav, UA: <=1.005 — AB (ref 1.010–1.025)
Urobilinogen, UA: 0.2 U/dL
pH, UA: 5 (ref 5.0–8.0)

## 2023-06-14 LAB — POCT GLYCOSYLATED HEMOGLOBIN (HGB A1C): HbA1c POC (<> result, manual entry): >15 % (ref 4.0–5.6)

## 2023-06-14 LAB — GLUCOSE, POCT (MANUAL RESULT ENTRY): POC Glucose: >600 mg/dL (ref 70–99)

## 2023-06-14 MED ORDER — INSULIN PEN NEEDLE 32G X 4 MM MISC
11 refills | Status: DC
Start: 2023-06-14 — End: 2023-06-14

## 2023-06-14 MED ORDER — METFORMIN HCL ER (MOD) 1000 MG PO TB24: 1000.0000 mg | ORAL_TABLET | Freq: Every day | ORAL | 5 refills | Status: DC

## 2023-06-14 MED ORDER — INSULIN ASPART 100 UNIT/ML IJ SOLN: 10.0000 [IU] | Freq: Once | INTRAMUSCULAR | Status: DC

## 2023-06-14 MED ORDER — INSULIN GLARGINE 100 UNIT/ML SOLOSTAR PEN: 10.0000 [IU] | PEN_INJECTOR | Freq: Every day | SUBCUTANEOUS | 0 refills | Status: DC

## 2023-06-14 MED ORDER — INSULIN GLARGINE 100 UNIT/ML SOLOSTAR PEN
10.0000 [IU] | PEN_INJECTOR | Freq: Every day | SUBCUTANEOUS | 11 refills | Status: DC
Start: 2023-06-14 — End: 2023-06-14

## 2023-06-14 MED ORDER — INSULIN PEN NEEDLE 32G X 4 MM MISC: 11 refills | Status: AC

## 2023-06-14 NOTE — Patient Instructions (Signed)
Pleasure to meet you today.  Your blood glucose checked today was over 600.  Your increased urination and frequency with urination is most likely due to really high blood sugar.  Put in prescription for insulin which you will take 10 units every night before you go to bed.  Please check your blood sugars fasting in the morning and late at night every day until we see you back on Monday  Also increase your metformin to 2 tablets a day until you are seen on Monday.  Make sure you are avoiding any soda, ginger ale, sugary drinks.  Only drink water and make sure you are drinking enough water to help bring down your sugar.  Follow-up with [place] on Monday

## 2023-06-14 NOTE — Assessment & Plan Note (Addendum)
Polyuria is likely in the setting of hyperglycemia with point-of-care CBG today over 600.  Patient does have poorly controlled diabetes with A1c obtained today >15%.  Though endorses compliance with her metformin 500 mg daily, current treatment regimen is insufficient to manage her diabetes.  Patient appears stable and tolerating p.o. intake at this time will manage outpatient; start insulin therapy and increase her metformin with close follow up. Patient report using insulin in the past and familiar with subcutaneous injection.  -Ordered CBG, A1c, UA -Rx Lantus 10 units daily -Increase metformin XR to 1000 mg daily -Encourage frequent hydration -Advised to discontinue resumption of soda or sugary drinks -Encouraged to check blood sugar twice daily, 1 fasting in the morning -Follow-up in 3 days.

## 2023-06-14 NOTE — Telephone Encounter (Signed)
Patient presents to office regarding issues with picking up Lantus prescription.   She reports that [facility] does not have medication in stock and will not have in store until Monday.   [facility] medication in stock. However, rx will need to be updated to dispense quantity of 15 ml, as they cannot break a box.   Received verbal orders from Dr. Elliot Gurney to update this and resend to new location.   Veronda Prude, RN

## 2023-06-14 NOTE — Progress Notes (Signed)
    SUBJECTIVE:   CHIEF COMPLAINT / HPI: Urinary Frequency  78 year old female with past medical history significant for type 2 diabetes presenting today due to concerns for increased urinary frequency.  Patient said her symptoms started about 2 weeks ago denies any associated dysuria but endorses increased thirst and drinking of liquids.  She mostly drinks water, ginger ale, coffee and tea. No nausea, vomiting, constipation or diarrhea.  Patient noted she has had also increased thirst.  Does not check her home sugar and current treatment regimen include metformin 500 mg daily.  She is supposed to be on Trulicity weekly however due to insurance have not been taking Trulicity.  She does not check her home blood sugars.  PERTINENT  PMH / PSH: T2DM  OBJECTIVE:   BP (!) 146/42   Pulse 63   Ht 5\' 1"  (1.549 m)   Wt 103 lb 12.8 oz (47.1 kg)   SpO2 100%   BMI 19.61 kg/m    Physical Exam General: Alert, well appearing, NAD HEENT: Dry mucous membrane Cardiovascular: RRR, No Murmurs, Normal S2/S2 Respiratory: CTAB, No wheezing or Rales Abdomen: No distension or tenderness Extremities: No edema on extremities    ASSESSMENT/PLAN:   Type 2 diabetes mellitus (HCC) Polyuria is likely in the setting of hyperglycemia with point-of-care CBG today over 600.  Patient does have poorly controlled diabetes with A1c obtained today >15%.  Though endorses compliance with her metformin 500 mg daily, current treatment regimen is insufficient to manage her diabetes.  Patient appears stable and tolerating p.o. intake at this time will manage outpatient; start insulin therapy and increase her metformin with close follow up. Patient report using insulin in the past and familiar with subcutaneous injection.  -Ordered CBG, A1c, UA -Rx Lantus 10 units daily -Increase metformin XR to 1000 mg daily -Encourage frequent hydration -Advised to discontinue resumption of soda or sugary drinks -Encouraged to check blood  sugar twice daily, 1 fasting in the morning -Follow-up in 3 days.     Jerre Simon, MD [facility]

## 2023-06-16 ENCOUNTER — Other Ambulatory Visit: Payer: Self-pay | Admitting: Family Medicine

## 2023-06-17 ENCOUNTER — Ambulatory Visit (INDEPENDENT_AMBULATORY_CARE_PROVIDER_SITE_OTHER): Payer: Medicare Other | Admitting: Student

## 2023-06-17 ENCOUNTER — Encounter: Payer: Self-pay | Admitting: Student

## 2023-06-17 VITALS — BP 144/67 | HR 72 | Ht 61.0 in | Wt 105.0 lb

## 2023-06-17 DIAGNOSIS — E1169 Type 2 diabetes mellitus with other specified complication: Secondary | ICD-10-CM

## 2023-06-17 LAB — GLUCOSE, POCT (MANUAL RESULT ENTRY): POC Glucose: 456 mg/dL — AB (ref 70–99)

## 2023-06-17 NOTE — Progress Notes (Signed)
    SUBJECTIVE:   CHIEF COMPLAINT / HPI:   Diabetes Follow-up Mallory Hayes is here for follow-up after being seen by Dr. Elliot Gurney on Friday with A1c >15 and CBG >600.  Since then she has been taking metformin 1000mg  daily (her renal function has historically precluded higher doses) and Lantus 10units daily. She has not been checking her sugars as she has had difficulty getting sufficient blood from lancet sticks due to her hands being cold.  Seems to be tolerating the meds fine, though. No other complaints.  PERTINENT  PMH / PSH: T2DM, HTN, Anxiety, CKD IV  OBJECTIVE:   BP (!) 144/67   Pulse 72   Ht 5\' 1"  (1.549 m)   Wt 105 lb (47.6 kg)   SpO2 95%   BMI 19.84 kg/m   Gen: Anxious but NAD HENT: MMM Cardio: RRR Pulm: normal WOB on RA, speaking in full sentences Ext: Hands are cool to touch but cap refill is <2s   ASSESSMENT/PLAN:   Assessment & Plan Type 2 diabetes mellitus with other specified complication, without long-term current use of insulin (HCC) CBG today is 456. Tolerating Lantus 10 units and Metformin 1000mg  daily. Given her renal function and uncontrolled sugars, would not add an SGLT2i at this time.  - CGM placed today, she will track sugars at home - Increase Lantus to 15 units daily - Continue metformin XR 1000mg  daily - She prefers to follow with Dr. Raymondo Band for her diabetes management. Case discussed with him. He will follow-up with her later this week to discuss dose adjustments and then will follow-up in person next Wednesday.     Eliezer Mccoy, MD [facility]

## 2023-06-17 NOTE — Patient Instructions (Addendum)
Ms. Oluwaseyi, Tull to meet you!  Let's increase your Lantus to 15 units daily. Dr. Raymondo Band will call you to check in later this week and may want to increase you further. We are placing a CGM for you so that you can keep an eye on your sugars. Dr. Raymondo Band will see you back in person next Wednesday. Keep taking the metformin at 1000mg  (2 tablets)/day.  Eliezer Mccoy, MD

## 2023-06-17 NOTE — Assessment & Plan Note (Signed)
CBG today is 456. Tolerating Lantus 10 units and Metformin 1000mg  daily. Given her renal function and uncontrolled sugars, would not add an SGLT2i at this time.  - CGM placed today, she will track sugars at home - Increase Lantus to 15 units daily - Continue metformin XR 1000mg  daily - She prefers to follow with Dr. Raymondo Band for her diabetes management. Case discussed with him. He will follow-up with her later this week to discuss dose adjustments and then will follow-up in person next Wednesday.

## 2023-06-18 ENCOUNTER — Other Ambulatory Visit: Payer: Self-pay | Admitting: Family Medicine

## 2023-06-18 NOTE — Progress Notes (Signed)
 Pharmacy Medication Assistance Program Note    06/18/2023  Patient ID: Mallory Hayes, female   DOB: [birth year], 78 y.o.   MRN: [id number]     05/27/2023  Outreach Medication One  Manufacturer Medication One Retail buyer Drugs Trulicity  Type of Radiographer, therapeutic Assistance  Date Application Sent to Prescriber 06/18/2023  Date Application Received From Patient 06/18/2023    Provider page & exception letter in pcp's box awaiting signature.

## 2023-06-18 NOTE — Telephone Encounter (Signed)
Patient went ahead and returned the [employer] application for Trulicity assistance.   We will attempt new enrollment with this company using their medication exception form. Patient meets criteria.

## 2023-06-20 ENCOUNTER — Telehealth: Payer: Self-pay | Admitting: Pharmacist

## 2023-06-20 NOTE — Telephone Encounter (Signed)
-----   Message from Eliezer Mccoy sent at 06/17/2023  6:46 PM EST ----- Dr. Raymondo Band, see attached patient note. CGM placed in clinic today and she has a reader. We are increasing her Lantus to 15 units daily today (2/17). She has follow-up scheduled with you on 2/26. Would benefit from a mid-week phone check-in and possible further dose adjustment of her insulin ahead of your in-person visit.  Thanks so much, J Dorothyann Gibbs, MD

## 2023-06-20 NOTE — Telephone Encounter (Signed)
Attempted to contact patient for follow-up of recent insulin adjustment.   Left HIPAA compliant voice mail  Plan to try calling again tomorrow.   Total time with patient call and documentation of interaction: 4 minutes.  Follow-up visit planned 06/26/2023

## 2023-06-21 ENCOUNTER — Telehealth: Payer: Self-pay | Admitting: Pharmacist

## 2023-06-21 ENCOUNTER — Other Ambulatory Visit: Payer: Self-pay

## 2023-06-21 NOTE — Telephone Encounter (Signed)
Patient contacted for follow-up of glucose control.    Since last contact patient reports she has increased lantus to 15 units daily.  She has not check finger sticks and her CGM is not connected.  While on the phone we reinitiated CGM sensor and reader connection.   Since dose increase from 10-15 patient notices decreased thirst and decreased nocturia (back to normal 1x per night).   No change in current regimen Follow-up in 5 days Rx clinic with me.    Total time with patient call and documentation of interaction: 12 minutes.

## 2023-06-21 NOTE — Telephone Encounter (Signed)
-----   Message from Eliezer Mccoy sent at 06/17/2023  6:46 PM EST ----- Dr. Raymondo Band, see attached patient note. CGM placed in clinic today and she has a reader. We are increasing her Lantus to 15 units daily today (2/17). She has follow-up scheduled with you on 2/26. Would benefit from a mid-week phone check-in and possible further dose adjustment of her insulin ahead of your in-person visit.  Thanks so much, J Dorothyann Gibbs, MD

## 2023-06-22 NOTE — Telephone Encounter (Signed)
 Reviewed and agree with Dr Macky Lower plan.

## 2023-06-23 ENCOUNTER — Other Ambulatory Visit: Payer: Self-pay | Admitting: Family Medicine

## 2023-06-24 ENCOUNTER — Ambulatory Visit: Payer: Medicare Other | Admitting: Pharmacist

## 2023-06-25 ENCOUNTER — Telehealth: Payer: Self-pay | Admitting: Podiatry

## 2023-06-25 ENCOUNTER — Telehealth: Payer: Self-pay | Admitting: *Deleted

## 2023-06-25 NOTE — Telephone Encounter (Signed)
 Can you clarify if patient is still actually taking this? It's not on her medication list Thanks [name], MD

## 2023-06-25 NOTE — Telephone Encounter (Signed)
 Rx request for humana true metrix test strips and bd single use swabs. Please advise. Chauncy Mangiaracina Bruna Potter, CMA

## 2023-06-25 NOTE — Telephone Encounter (Signed)
 Pt left message today at 1038am stating she got a reminder call about her appt on 2/28 and her card that she got says 3/29 at 245pm.  I returned call and left message appt was scheduled for 2/28(this Friday) at 1215 and I did not have her scheduled for anything on march 29th.

## 2023-06-26 ENCOUNTER — Encounter: Payer: Self-pay | Admitting: Pharmacist

## 2023-06-26 ENCOUNTER — Ambulatory Visit (INDEPENDENT_AMBULATORY_CARE_PROVIDER_SITE_OTHER): Payer: Medicare Other | Admitting: Pharmacist

## 2023-06-26 VITALS — BP 125/48 | HR 53 | Wt 106.8 lb

## 2023-06-26 DIAGNOSIS — E1169 Type 2 diabetes mellitus with other specified complication: Secondary | ICD-10-CM | POA: Diagnosis not present

## 2023-06-26 NOTE — Assessment & Plan Note (Signed)
 Diabetes longstanding currently uncontrolled however, slightly improved with insulin therapy. Patient is able to verbalize appropriate hypoglycemia management plan. Medication adherence appears good. Control is suboptimal due to average BG 266 and most recent A1c > 15. -Continued basal insulin Lantus/Basaglar/Semglee (insulin glargine) 15 units daily in the morning -Continued GLP-1 Trulicity (dulaglutide) 0.75 mg  -Continued metformin 1000 mg daily  -Applied new sensor on patient's right arm

## 2023-06-26 NOTE — Progress Notes (Signed)
 S:     Chief Complaint  Patient presents with   Medication Management    DM Management   78 y.o. female who presents for diabetes evaluation, education, and management. Patient arrives in good spirits and presents without any assistance.   Patient was referred and last seen by Primary Care Provider, Dr. Marisue Humble, on 06/17/23.   PMH is significant for DM, HTN, anxiety, CKD At last visit, increased insulin glargine (Lantus) from 10 units to 15 units daily.   Current diabetes medications include:  insulin glargine (Lantus) 15 units. Metformin 1000 mg daily, dulaglutide (Trulicity) 0.75 mg weekly Current hypertension medications include: carvedilol (coreg) 3.125 mg BID, olmesartan-amlodipine-hydrochlorothiazide 40-10-25 mg Current hyperlipidemia medications include: rosuvastatin (Crestor) 20 mg daily  Patient reports adherence to taking all medications as prescribed.   Do you feel that your medications are working for you? yes Have you been experiencing any side effects to the medications prescribed? no Do you have any problems obtaining medications due to transportation or finances? no Insurance coverage: [facility] Medicare  Patient denies hypoglycemic events.   Patient reports nocturia (nighttime urination). 3-4x nightly Patient denies neuropathy (nerve pain). Patient reports visual changes. Reports blurry vision for 2 weeks that has since resolved. Patient reports self foot exams.   Patient reported dietary habits: Eats 2-3 meals/day Breakfast: oatmeal, eggs, apple slices Lunch: sandwich or salad Dinner: frozen dinner Drinks: milk in morning, seltzers, sparkling water. Reports she has stopped drinking ginger ale  Patient-reported exercise habits: gardens   O:   Review of Systems  Eyes:  Negative for blurred vision.  Psychiatric/Behavioral:  The patient is nervous/anxious.     Physical Exam Constitutional:      Appearance: Normal appearance.  Pulmonary:     Effort:  Pulmonary effort is normal.  Neurological:     Mental Status: She is alert.  Psychiatric:        Mood and Affect: Mood normal.        Behavior: Behavior normal.     Libre3 CGM Download today Feb 21 - Jun 23 2023 % Time CGM is active: 15% Average Glucose: 266 mg/dL Glucose Management Indicator: --  Glucose Variability: 27.3% (goal <36%) Time in Goal:  - Time in range 70-180: 12% - Time above range: 88% - Time below range: 0%    Lab Results  Component Value Date   HGBA1C >15.0 06/14/2023   Vitals:   06/26/23 1100  BP: (!) 125/48  Pulse: (!) 53  SpO2: 100%    Lipid Panel     Component Value Date/Time   CHOL 179 03/06/2023 1451   CHOL 145 11/20/2022 1502   TRIG 86.0 03/06/2023 1451   HDL 76.80 03/06/2023 1451   HDL 68 11/20/2022 1502   CHOLHDL 2 03/06/2023 1451   VLDL 17.2 03/06/2023 1451   LDLCALC 85 03/06/2023 1451   LDLCALC 62 11/20/2022 1502   LDLDIRECT 62 07/28/2012 0927    Clinical Atherosclerotic Cardiovascular Disease (ASCVD): Yes  The 10-year ASCVD risk score (Arnett DK, et al., 2019) is: 34.7%   Values used to calculate the score:     Age: 65 years     Sex: Female     Is Non-Hispanic African American: Yes     Diabetic: Yes     Tobacco smoker: No     Systolic Blood Pressure: 125 mmHg     Is BP treated: Yes     HDL Cholesterol: 76.8 mg/dL     Total Cholesterol: 179 mg/dL   A/P:  Diabetes longstanding currently uncontrolled however, slightly improved with insulin therapy. Patient is able to verbalize appropriate hypoglycemia management plan. Medication adherence appears good. Control is suboptimal due to average BG 266 and most recent A1c > 15. -Continued basal insulin Lantus/Basaglar/Semglee (insulin glargine) 15 units daily in the morning -Continued GLP-1 Trulicity (dulaglutide) 0.75 mg  -Continued metformin 1000 mg daily  -Applied new sensor on patient's right arm -Patient educated on purpose, proper use, and potential adverse effects.   -Extensively discussed pathophysiology of diabetes, recommended lifestyle interventions, dietary effects on blood sugar control.  -Counseled on s/sx of and management of hypoglycemia.  -Next A1c anticipated May 2025.   ASCVD risk - primary prevention in patient with diabetes. Last LDL is at goal of <70  mg/dL. ASCVD risk factors include DM and 10-year ASCVD risk score of 40.6.  -Continued rosuvastatin 20 mg.   Hypertension longstanding currently controlled. Blood pressure goal of <130/80 mmHg. Medication adherence good. Blood pressure control is optimal due to BP readings at goal. -Continued carvedilol (coreg) 3.125 mg BID, olmesartan-amlodipine-hydrochlorothiazide 40-10-25 mg.  Written patient instructions provided. Patient verbalized understanding of treatment plan.  Total time in face to face counseling 19 minutes.    Follow-up:  Pharmacist 07/11/23 PCP clinic visit in TBD Patient seen with Lavona Mound, PharmD Candidate and Pearletha Forge, PharmD Candidate.

## 2023-06-26 NOTE — Telephone Encounter (Signed)
 Pt is not taking because she ant swallow them. Keian Odriscoll Bruna Potter, CMA

## 2023-06-26 NOTE — Patient Instructions (Signed)
 It was nice to see you today!  Your goal blood sugar is 80-130 before eating and less than 180 after eating.  Medication Changes:   Continue all other medication the same.   Monitor blood sugars at home and keep a log (glucometer or piece of paper) to bring with you to your next visit.  Keep up the good work with diet and exercise. Aim for a diet full of vegetables, fruit and lean meats (chicken, [place], fish). Try to limit salt intake by eating fresh or frozen vegetables (instead of canned), rinse canned vegetables prior to cooking and do not add any additional salt to meals.

## 2023-06-27 ENCOUNTER — Encounter: Payer: Medicare HMO | Admitting: Obstetrics and Gynecology

## 2023-06-27 NOTE — Progress Notes (Signed)
 Reviewed and agree with Dr Macky Lower plan.

## 2023-06-28 ENCOUNTER — Ambulatory Visit: Payer: Medicare HMO | Admitting: Podiatry

## 2023-07-04 ENCOUNTER — Encounter: Payer: Self-pay | Admitting: Podiatry

## 2023-07-04 MED ORDER — TRUE METRIX BLOOD GLUCOSE TEST VI STRP: ORAL_STRIP | 12 refills | Status: AC

## 2023-07-04 MED ORDER — BD SWAB SINGLE USE REGULAR PADS: MEDICATED_PAD | 3 refills | Status: DC

## 2023-07-04 NOTE — Addendum Note (Signed)
 Addended by: [name] on: 07/04/2023 08:15 AM   Modules accepted: Orders

## 2023-07-09 NOTE — Progress Notes (Unsigned)
 Pharmacy Medication Assistance Program Note    07/09/2023  Patient ID: Mallory Hayes, female   DOB: 1945-11-16, 78 y.o.   MRN: [id number]     05/27/2023  Outreach Med[employer] One  Manufacturer Medication One Retail buyer Drugs Trulicity  Type of Assistance Manufacturer Assistance  Date Application Sent to Prescriber 06/18/2023  Date Application Received From Patient 06/18/2023  Date Application Received From Provider 07/09/2023  Date Application Submitted to Manufacturer 07/09/2023  Method Application Sent to Manufacturer Fax     New med

## 2023-07-09 NOTE — Telephone Encounter (Signed)
 Application submitted, please send a 90 day RX (with refills) of Trulicity to [facility] pharmacy (formally [facility]).

## 2023-07-10 DIAGNOSIS — H5000 Unspecified esotropia: Secondary | ICD-10-CM | POA: Diagnosis not present

## 2023-07-10 DIAGNOSIS — H5022 Vertical strabismus, left eye: Secondary | ICD-10-CM | POA: Diagnosis not present

## 2023-07-10 DIAGNOSIS — H43811 Vitreous degeneration, right eye: Secondary | ICD-10-CM | POA: Diagnosis not present

## 2023-07-10 DIAGNOSIS — H25813 Combined forms of age-related cataract, bilateral: Secondary | ICD-10-CM | POA: Diagnosis not present

## 2023-07-10 DIAGNOSIS — H532 Diplopia: Secondary | ICD-10-CM | POA: Diagnosis not present

## 2023-07-10 DIAGNOSIS — E119 Type 2 diabetes mellitus without complications: Secondary | ICD-10-CM | POA: Diagnosis not present

## 2023-07-10 LAB — HM DIABETES EYE EXAM

## 2023-07-11 ENCOUNTER — Ambulatory Visit (INDEPENDENT_AMBULATORY_CARE_PROVIDER_SITE_OTHER): Payer: Medicare Other | Admitting: Pharmacist

## 2023-07-11 ENCOUNTER — Encounter: Payer: Self-pay | Admitting: Pharmacist

## 2023-07-11 ENCOUNTER — Other Ambulatory Visit: Payer: Self-pay

## 2023-07-11 VITALS — BP 153/66 | HR 46 | Wt 108.0 lb

## 2023-07-11 DIAGNOSIS — E1169 Type 2 diabetes mellitus with other specified complication: Secondary | ICD-10-CM | POA: Diagnosis not present

## 2023-07-11 MED ORDER — TRULICITY 0.75 MG/0.5ML ~~LOC~~ SOAJ: 0.7500 mg | SUBCUTANEOUS | 3 refills | Status: DC

## 2023-07-11 NOTE — Patient Instructions (Signed)
 It was nice to see you today!  Your goal blood sugar is 80-130 before eating and less than 180 after eating.  Medication Changes: Increase Lantus (insulin glargine) from 15 to 18 units once daily in the morning  Continue all other medication the same.   Monitor blood sugars at home and keep a log (glucometer or piece of paper) to bring with you to your next visit.  Keep up the good work with diet and exercise. Aim for a diet full of vegetables, fruit and lean meats (chicken, [place], fish). Try to limit salt intake by eating fresh or frozen vegetables (instead of canned), rinse canned vegetables prior to cooking and do not add any additional salt to meals.

## 2023-07-11 NOTE — Telephone Encounter (Signed)
 Rx sent [name], MD

## 2023-07-11 NOTE — Progress Notes (Signed)
 S:     Chief Complaint  Patient presents with   Medication Management    Diabetes - CGM   78 y.o. female who presents for diabetes evaluation, education, and management. Patient arrives in good spirits and presents without any assistance.   Patient was referred and last seen by Primary Care Provider, Dr. Marisue Humble, on 06/14/23.   PMH is significant for DM, HTN, anxiety, CKD  At last visit, no changes to diabetes medication regimen     Current diabetes medications include:  insulin glargine (Lantus) 15 units. Metformin 1000 mg daily, dulaglutide (Trulicity) 0.75 mg weekly Current hypertension medications include: carvedilol (coreg) 3.125 mg BID, olmesartan-amlodipine-hydrochlorothiazide 40-10-25 mg Current hyperlipidemia medications include: rosuvastatin (Crestor) 20 mg daily   Patient reports adherence to taking most medications as prescribed. Patient reports not taking Trulicity for the past 3-4 months.   Do you feel that your medications are working for you? yes Have you been experiencing any side effects to the medications prescribed? no Do you have any problems obtaining medications due to transportation or finances? no Insurance coverage: [facility] Medicare   Patient reports hypoglycemic events. Reports one hypoglycemic event in the afternoon after forgetting to eat in the afternoon before gardening.   Patient reports visual changes. Patient reports upcoming cataracts surgery on 3/17 in the morning.  O:   Review of Systems  Psychiatric/Behavioral:  The patient is nervous/anxious.   All other systems reviewed and are negative.   Physical Exam Vitals reviewed.  Constitutional:      Appearance: Normal appearance.  Pulmonary:     Effort: Pulmonary effort is normal.  Neurological:     Mental Status: She is alert.  Psychiatric:        Mood and Affect: Mood normal.        Thought Content: Thought content normal.        Judgment: Judgment normal.    Libre3 CGM Download  today 07/11/2023 % Time CGM is active: 97% Average Glucose: 244 mg/dL Glucose Management Indicator: 9.1  Glucose Variability: 26.3% (goal <36%) Time in Goal:  - Time in range 70-180: 13% - Time above range: 87% - Time below range: 0% Observed patterns: Persistently elevated levels with rare hypoglycemic events. Patient reported forgetting to eat prior to experiencing one hypoglycemic event in the afternoon   Lab Results  Component Value Date   HGBA1C >15.0 06/14/2023   Vitals:   07/11/23 1350 07/11/23 1353  BP: (!) 164/54 (!) 153/66  Pulse: (!) 46   SpO2: 100%     Lipid Panel     Component Value Date/Time   CHOL 179 03/06/2023 1451   CHOL 145 11/20/2022 1502   TRIG 86.0 03/06/2023 1451   HDL 76.80 03/06/2023 1451   HDL 68 11/20/2022 1502   CHOLHDL 2 03/06/2023 1451   VLDL 17.2 03/06/2023 1451   LDLCALC 85 03/06/2023 1451   LDLCALC 62 11/20/2022 1502   LDLDIRECT 62 07/28/2012 0927    Clinical Atherosclerotic Cardiovascular Disease (ASCVD):  The 10-year ASCVD risk score (Arnett DK, et al., 2019) is: 43.4%   Values used to calculate the score:     Age: 72 years     Sex: Female     Is Non-Hispanic African American: Yes     Diabetic: Yes     Tobacco smoker: No     Systolic Blood Pressure: 153 mmHg     Is BP treated: Yes     HDL Cholesterol: 76.8 mg/dL     Total Cholesterol:  179 mg/dL   A/P: Diabetes longstanding currently improved control with use of insulin therapy. Patient is able to verbalize appropriate hypoglycemia management plan. Medication adherence appears good. Control remains suboptimal inadequate insulin dose. -Increased dose of basal insulin Lantus (insulin glargine) from 15 units to 18 units daily in the morning.  -Continued metformin.  -Reapplied Libre sensor  -Patient educated on purpose, proper use, and potential adverse effects.  -Extensively discussed pathophysiology of diabetes, recommended lifestyle interventions, dietary effects on blood sugar  control.  -Counseled on s/sx of and management of hypoglycemia.    Written patient instructions provided. Patient verbalized understanding of treatment plan.  Total time in face to face counseling 37 minutes.    Follow-up:  Pharmacist 2 weeks PCP clinic visit in PRN Patient seen with Threasa Heads, PharmD Candidate and Mack Guise, PharmD Candidate.

## 2023-07-11 NOTE — Progress Notes (Signed)
 Pharmacy Medication Assistance Program Note    07/11/2023  Patient ID: Mallory Hayes, female   DOB: 16-Feb-1946, 78 y.o.   MRN: [id number]     05/27/2023  Outreach Medication One  Manufacturer Medication One Retail buyer Drugs Trulicity  Type of Assistance Manufacturer Assistance  Date Application Sent to Prescriber 06/18/2023  Date Application Received From Patient 06/18/2023  Date Application Received From Provider 07/09/2023  Date Application Submitted to Manufacturer 07/09/2023  Method Application Sent to Manufacturer Fax  Patient Assistance Determination Approved  Approval Start Date 07/10/2023  Approval End Date 04/29/2024     Approved! Med will ship to patients home.

## 2023-07-11 NOTE — Assessment & Plan Note (Signed)
 Diabetes longstanding currently improved control with use of insulin therapy. Patient is able to verbalize appropriate hypoglycemia management plan. Medication adherence appears good. Control remains suboptimal inadequate insulin dose. -Increased dose of basal insulin Lantus (insulin glargine) from 15 units to 18 units daily in the morning.  -Continued metformin.  -Reapplied Libre sensor  -Patient educated on purpose, proper use, and potential adverse effects.  -Extensively discussed pathophysiology of diabetes, recommended lifestyle interventions, dietary effects on blood sugar control.

## 2023-07-12 ENCOUNTER — Other Ambulatory Visit: Payer: Self-pay | Admitting: Family Medicine

## 2023-07-12 DIAGNOSIS — E1169 Type 2 diabetes mellitus with other specified complication: Secondary | ICD-10-CM

## 2023-07-12 MED ORDER — METFORMIN HCL ER (MOD) 1000 MG PO TB24: 1000.0000 mg | ORAL_TABLET | Freq: Every day | ORAL | 3 refills | Status: DC

## 2023-07-12 MED ORDER — METFORMIN HCL ER 500 MG PO TB24
500.0000 mg | ORAL_TABLET | Freq: Every day | ORAL | 1 refills | Status: DC
Start: 2023-07-12 — End: 2023-07-22

## 2023-07-12 NOTE — Progress Notes (Signed)
 Reviewed and agree with Dr Macky Lower plan.

## 2023-07-18 ENCOUNTER — Ambulatory Visit (HOSPITAL_BASED_OUTPATIENT_CLINIC_OR_DEPARTMENT_OTHER): Payer: Self-pay

## 2023-07-19 ENCOUNTER — Ambulatory Visit: Payer: Medicare HMO | Admitting: Podiatry

## 2023-07-20 ENCOUNTER — Other Ambulatory Visit: Payer: Self-pay | Admitting: Family Medicine

## 2023-07-22 ENCOUNTER — Other Ambulatory Visit: Payer: Self-pay

## 2023-07-22 MED ORDER — METFORMIN HCL ER 500 MG PO TB24
500.0000 mg | ORAL_TABLET | Freq: Every day | ORAL | 1 refills | Status: DC
Start: 2023-07-22 — End: 2023-07-23

## 2023-07-23 ENCOUNTER — Encounter: Payer: Self-pay | Admitting: Pharmacist

## 2023-07-23 ENCOUNTER — Ambulatory Visit (INDEPENDENT_AMBULATORY_CARE_PROVIDER_SITE_OTHER): Admitting: Pharmacist

## 2023-07-23 VITALS — BP 141/53 | HR 49 | Wt 109.2 lb

## 2023-07-23 DIAGNOSIS — I10 Essential (primary) hypertension: Secondary | ICD-10-CM

## 2023-07-23 DIAGNOSIS — E1169 Type 2 diabetes mellitus with other specified complication: Secondary | ICD-10-CM | POA: Diagnosis not present

## 2023-07-23 MED ORDER — INSULIN GLARGINE-YFGN 100 UNIT/ML ~~LOC~~ SOPN
10.0000 [IU] | PEN_INJECTOR | Freq: Every day | SUBCUTANEOUS | Status: DC
Start: 2023-07-23 — End: 2023-08-06

## 2023-07-23 MED ORDER — METFORMIN HCL ER 500 MG PO TB24: 500.0000 mg | ORAL_TABLET | Freq: Two times a day (BID) | ORAL | Status: DC

## 2023-07-23 NOTE — Assessment & Plan Note (Signed)
 Hypertension longstanding currently elevated. Blood pressure goal of <130/80 mmHg. Medication adherence good. Blood pressure control is suboptimal due to stress following warm weather. Persistent bradycardia is concerning.  Patient is asymptomatic with her current heart rate.  -Continued carvedilol (coreg) 3.125 mg BID -Continued olmesartan-amlodipine-hydrochlorothiazide 40-10-25 mg

## 2023-07-23 NOTE — Progress Notes (Signed)
 S:     Chief Complaint  Patient presents with   Medication Management    Diabetes f/u   78 y.o. female who presents for diabetes evaluation, education, and management. Patient arrives in good spirits and presents without any assistance.   Patient was referred and last seen by Primary Care Provider, Dr. Marisue Humble, on 06/17/23.   PMH is significant for DM, HTN, anxiety, CKD .  At last visit, increased Lantus from 15 to 18 units daily.   Current diabetes medications include: metformin 1000 mg daily, dulaglutide (Trulicity) 0.75 mg weekly (starting this week after cataract surgery), insulin glargine (Semglee) 18 units Current hypertension medications include: carvedilol (coreg) 3.125 mg BID, olmesartan-amlodipine-hydrochlorothiazide 40-10-25 mg Current hyperlipidemia medications include: rosuvastatin (Crestor) 20 mg daily  Patient reports adherence to taking most medications as prescribed. Reports taking rosuvastatin ~4 days/week. Reports recently obtaining Trulicity through mail order and will begin taking the day after her cataract surgery (scheduled this Thursday)  Do you feel that your medications are working for you? yes Have you been experiencing any side effects to the medications prescribed? no Do you have any problems obtaining medications due to transportation or finances? no Insurance coverage: [facility] Medicare  Patient denies hypoglycemic events. CGM download today reports occasional levels nearing below target range.   Patient reported dietary habits: Eats 3 meals/day Breakfast: bananas, peanut butter crackers, oatmeal, eggs, nuts, eggs Lunch: chicken sandwich Dinner: chicken pot pie, [place] (5-6pm) Drinks: coffee w/ creamer and sugar  O:  Review of Systems  Constitutional:  Negative for malaise/fatigue.  Eyes:  Positive for blurred vision.  All other systems reviewed and are negative.  Physical Exam Constitutional:      Appearance: Normal appearance.  Pulmonary:      Effort: Pulmonary effort is normal.  Neurological:     Mental Status: She is alert.  Psychiatric:        Mood and Affect: Mood normal.        Behavior: Behavior normal.        Thought Content: Thought content normal.        Judgment: Judgment normal.    Libre3 CGM Download today 07/23/23 % Time CGM is active: 97% Average Glucose: 201 mg/dL Glucose Management Indicator: 8.1  Glucose Variability: 32.2% (goal <36%) Time in Goal:  - Time in range 70-180: 39% - Time above range: 61% - Time below range: 0% Observed patterns: Elevated hyperglycemia events mornings and evenings post-meals  Lab Results  Component Value Date   HGBA1C >15.0 06/14/2023   Vitals:   07/23/23 1415 07/23/23 1417  BP: (!) 142/48 (!) 141/53  Pulse: (!) 49   SpO2: 100%     Lipid Panel     Component Value Date/Time   CHOL 179 03/06/2023 1451   CHOL 145 11/20/2022 1502   TRIG 86.0 03/06/2023 1451   HDL 76.80 03/06/2023 1451   HDL 68 11/20/2022 1502   CHOLHDL 2 03/06/2023 1451   VLDL 17.2 03/06/2023 1451   LDLCALC 85 03/06/2023 1451   LDLCALC 62 11/20/2022 1502   LDLDIRECT 62 07/28/2012 0927    Clinical Atherosclerotic Cardiovascular Disease (ASCVD): Yes  The 10-year ASCVD risk score (Arnett DK, et al., 2019) is: 39.7%   Values used to calculate the score:     Age: 49 years     Sex: Female     Is Non-Hispanic African American: Yes     Diabetic: Yes     Tobacco smoker: No     Systolic Blood Pressure:  141 mmHg     Is BP treated: Yes     HDL Cholesterol: 76.8 mg/dL     Total Cholesterol: 179 mg/dL   A/P: Diabetes longstanding currently improved. Patient is able to verbalize appropriate hypoglycemia management plan. Medication adherence appears good. Patient reports planning to start Trulicity this Friday, the day after her cataract surgery. Control is suboptimal due to dietary intake and not starting Trulicity. -Decreased dose of basal insulin Semglee (insulin glargine) from 18 units to 10  units daily in the morning. Before reaching 10 units, patient will take 8 units in the morning the day before her surgery (tomorrow) and 16 units on the day of her surgery after her procedure (Thursday). Patient will proceed with new dose of 10 units on Friday and thereafter. -Started GLP-1 Trulicity (dulaglutide) 0.75 mg.  -Continued metformin 500 mg BID.  -Patient educated on purpose, proper use, and potential adverse effects.  -Patient counseled on reducing carbohydrate intake with meals and reducing sugar intake with coffee, gum, and oatmeal. -Extensively discussed pathophysiology of diabetes, recommended lifestyle interventions, dietary effects on blood sugar control.  -Counseled on s/sx of and management of hypoglycemia.  -Next A1c anticipated May 2025.   ASCVD risk - primary prevention in patient with diabetes. Last LDL is at goal of <70  mg/dL. ASCVD risk factors include DM and 10-year ASCVD risk score of 39.7.  -Continued rosuvastatin 20 mg.   Hypertension longstanding currently elevated. Blood pressure goal of <130/80 mmHg. Medication adherence good. Blood pressure control is suboptimal due to stress following warm weather. Persistent bradycardia is concerning.  Patient is asymptomatic with her current heart rate.  -Continued carvedilol (coreg) 3.125 mg BID -Continued olmesartan-amlodipine-hydrochlorothiazide 40-10-25 mg   Written patient instructions provided. Patient verbalized understanding of treatment plan.  Total time in face to face counseling 33 minutes.    Follow-up:  Pharmacist Dr. Raymondo Band 08/06/23 PCP clinic visit PRN Patient seen with Threasa Heads, PharmD Candidate and Mack Guise, PharmD Candidate.

## 2023-07-23 NOTE — Patient Instructions (Signed)
 It was nice to see you today!  Your goal blood sugar is 80-130 before eating and less than 180 after eating.  Medication Changes: ONLY on Wednesday morning, decrease Semglee to 8 units  On Thursday, take Semglee 16 units when you return home from your procedure  On Friday, take Semglee 10 units daily and continue on that dose thereafter  START Trulicity once weekly on Friday  Continue all other medication the same.   Keep up the good work with diet and exercise. Aim for a diet full of vegetables, fruit and lean meats (chicken, [place], fish). Try to limit salt intake by eating fresh or frozen vegetables (instead of canned), rinse canned vegetables prior to cooking and do not add any additional salt to meals.

## 2023-07-23 NOTE — Assessment & Plan Note (Signed)
 Diabetes longstanding currently improved. Patient is able to verbalize appropriate hypoglycemia management plan. Medication adherence appears good. Patient reports planning to start Trulicity this Friday, the day after her cataract surgery. Control is suboptimal due to dietary intake and not starting Trulicity. -Decreased dose of basal insulin Semglee (insulin glargine) from 18 units to 10 units daily in the morning. Before reaching 10 units, patient will take 8 units in the morning the day before her surgery (tomorrow) and 16 units on the day of her surgery after her procedure (Thursday). Patient will proceed with new dose of 10 units on Friday and thereafter. -Started GLP-1 Trulicity (dulaglutide) 0.75 mg.  -Continued metformin 500 mg BID.  -Patient educated on purpose, proper use, and potential adverse effects.  -Patient counseled on reducing carbohydrate intake with meals and reducing sugar intake with coffee, gum, and oatmeal. -Extensively discussed pathophysiology of diabetes, recommended lifestyle interventions, dietary effects on blood sugar control.  -Counseled on s/sx of and management of hypoglycemia.  -

## 2023-07-24 NOTE — Progress Notes (Signed)
 Reviewed and agree with Dr Macky Lower plan.

## 2023-07-25 DIAGNOSIS — H2512 Age-related nuclear cataract, left eye: Secondary | ICD-10-CM | POA: Diagnosis not present

## 2023-08-05 ENCOUNTER — Ambulatory Visit (INDEPENDENT_AMBULATORY_CARE_PROVIDER_SITE_OTHER): Payer: Medicare HMO

## 2023-08-05 VITALS — Ht 61.0 in | Wt 104.0 lb

## 2023-08-05 DIAGNOSIS — Z0000 Encounter for general adult medical examination without abnormal findings: Secondary | ICD-10-CM | POA: Diagnosis not present

## 2023-08-05 NOTE — Progress Notes (Signed)
 Because this visit was a virtual/telehealth visit,  certain criteria was not obtained, such a blood pressure, CBG if applicable, and timed get up and go. Any medications not marked as "taking" were not mentioned during the medication reconciliation part of the visit. Any vitals not documented were not able to be obtained due to this being a telehealth visit or patient was unable to self-report a recent blood pressure reading due to a lack of equipment at home via telehealth. Vitals that have been documented are verbally provided by the patient.   Subjective:   Mallory Hayes is a 78 y.o. who presents for a Medicare Wellness preventive visit.  Visit Complete: Virtual I connected with  Mallory Hayes on 08/05/23 by a audio enabled telemedicine application and verified that I am speaking with the correct person using two identifiers.  Patient Location: Home  Provider Location: Office/Clinic  I discussed the limitations of evaluation and management by telemedicine. The patient expressed understanding and agreed to proceed.  Vital Signs: Because this visit was a virtual/telehealth visit, some criteria may be missing or patient reported. Any vitals not documented were not able to be obtained and vitals that have been documented are patient reported.  VideoDeclined- This patient declined Librarian, academic. Therefore the visit was completed with audio only.  Persons Participating in Visit: Patient.  AWV Questionnaire: No: Patient Medicare AWV questionnaire was not completed prior to this visit.  Cardiac Risk Factors include: advanced age (>5men, >24 women);diabetes mellitus;dyslipidemia;family history of premature cardiovascular disease;hypertension     Objective:    Today's Vitals   08/05/23 1423  Weight: 104 lb (47.2 kg)  Height: 5\' 1"  (1.549 m)  PainSc: 0-No pain   Body mass index is 19.65 kg/m.     08/05/2023    2:26 PM 06/17/2023    1:41 PM 02/21/2023     3:21 PM 01/10/2023    3:16 PM 10/30/2022    2:59 PM 09/18/2022    1:30 PM 07/03/2022   12:07 PM  Advanced Directives  Does Patient Have a Medical Advance Directive? No No No No No No No  Would patient like information on creating a medical advance directive? No - Patient declined No - Patient declined No - Patient declined No - Patient declined No - Patient declined No - Patient declined No - Patient declined    Current Medications (verified) Outpatient Encounter Medications as of 08/05/2023  Medication Sig   Alcohol Swabs (B-D SINGLE USE SWABS REGULAR) PADS Use to check blood sugar three times daily (Patient not taking: Reported on 07/23/2023)   glucose blood (TRUE METRIX BLOOD GLUCOSE TEST) test strip Use to check blood sugar three times daily (Patient not taking: Reported on 07/23/2023)   aspirin 81 MG tablet Take 81 mg by mouth daily.   Blood Glucose Monitoring Suppl DEVI 1 each by Does not apply route in the morning, at noon, and at bedtime. May substitute to any manufacturer covered by patient's insurance. (Patient not taking: Reported on 07/23/2023)   Calcium Carbonate Antacid (TUMS PO) Take 1 tablet by mouth daily as needed.   carvedilol (COREG) 3.125 MG tablet TAKE 1 TABLET BY MOUTH TWICE A DAY WITH FOOD   Continuous Glucose Receiver (FREESTYLE LIBRE 3 READER) DEVI USE AS DIRECTED   diclofenac Sodium (VOLTAREN) 1 % GEL Apply 2 g topically 4 (four) times daily.   Dulaglutide (TRULICITY) 0.75 MG/0.5ML SOAJ Inject 0.75 mg into the skin once a week. (Patient not taking: Reported on 07/23/2023)  fluticasone (FLONASE) 50 MCG/ACT nasal spray USE 2 SPRAYS IN EACH NOSTRIL EVERY DAY   insulin glargine-yfgn (SEMGLEE) 100 UNIT/ML Pen Inject 10 Units into the skin daily.   Insulin Pen Needle 32G X 4 MM MISC As directed   metFORMIN (GLUCOPHAGE-XR) 500 MG 24 hr tablet Take 1 tablet (500 mg total) by mouth 2 (two) times daily with a meal.   Olmesartan-amLODIPine-HCTZ 40-10-25 MG TABS TAKE 1 TABLET BY MOUTH  EVERY DAY   Polyethylene Glycol 3350 (PEG 3350) 17 GM/SCOOP POWD Take 17g by mouth daily prn   rosuvastatin (CRESTOR) 20 MG tablet TAKE 1 TABLET EVERY DAY   vitamin E 200 UNIT capsule Take by mouth daily.   No facility-administered encounter medications on file as of 08/05/2023.    Allergies (verified) Amaryl [glimepiride], Food, and Sulfamethoxazole   History: Past Medical History:  Diagnosis Date   Diabetes mellitus without complication (HCC)    GERD (gastroesophageal reflux disease)    Hyperlipidemia    on meds   Hypertension    OSA (obstructive sleep apnea)    no cpap   Pure hypercholesterolemia 12/27/2021   Sleep apnea    Past Surgical History:  Procedure Laterality Date   APPENDECTOMY     BREAST EXCISIONAL BIOPSY Right    benign   CHOLECYSTECTOMY     COLONOSCOPY  08/16/2009   PARTIAL HYSTERECTOMY  1991   POLYPECTOMY     Family History  Problem Relation Age of Onset   Colon cancer Mother 74   Ovarian cancer Mother    Heart disease Mother        required stent 12 yo   Other Father        brain tumor   Prostate cancer Brother    Hypertension Brother    Diabetes Brother    Thyroid disease Maternal Grandmother    Heart attack Maternal Grandfather    Social History   Socioeconomic History   Marital status: Single    Spouse name: Not on file   Number of children: 0   Years of education: 12   Highest education level: High school graduate  Occupational History   Occupation: Retired  Tobacco Use   Smoking status: Never   Smokeless tobacco: Never  Vaping Use   Vaping status: Never Used  Substance and Sexual Activity   Alcohol use: No   Drug use: No   Sexual activity: Not Currently    Partners: Male    Birth control/protection: Surgical    Comment: hysterectomy  Other Topics Concern   Not on file  Social History Narrative   Pt's 50 yo mother died 08-17-2010 of breast cancer. Pt was very close with mother.  Has 2 siblings that live out of town, but has some  cousins and friends in the [place] area.   Retired, never married   No h/o tob, EToH, or drug use   Enjoys walking, reading, dining out, and [employer]   Very active in her church    Social Drivers of Health   Financial Resource Strain: Low Risk  (08/05/2023)   Overall Financial Resource Strain (CARDIA)    Difficulty of Paying Living Expenses: Not hard at all  Food Insecurity: Food Insecurity Present (08/05/2023)   Hunger Vital Sign    Worried About Running Out of Food in the Last Year: Sometimes true    Ran Out of Food in the Last Year: Sometimes true  Transportation Needs: No Transportation Needs (08/05/2023)   PRAPARE - Transportation  Lack of Transportation (Medical): No    Lack of Transportation (Non-Medical): No  Physical Activity: Sufficiently Active (08/05/2023)   Exercise Vital Sign    Days of Exercise per Week: 7 days    Minutes of Exercise per Session: 30 min  Stress: No Stress Concern Present (08/05/2023)   [employer] of Occupational Health - Occupational Stress Questionnaire    Feeling of Stress : Not at all  Social Connections: Moderately Integrated (08/05/2023)   Social Connection and Isolation Panel [NHANES]    Frequency of Communication with Friends and Family: More than three times a week    Frequency of Social Gatherings with Friends and Family: Once a week    Attends Religious Services: More than 4 times per year    Active Member of [employer] or Organizations: Yes    Attends Engineer, structural: More than 4 times per year    Marital Status: Never married    Tobacco Counseling Counseling given: Not Answered    Clinical Intake:  Pre-visit preparation completed: Yes  Pain : No/denies pain Pain Score: 0-No pain     BMI - recorded: 19.65 Nutritional Status: BMI of 19-24  Normal Nutritional Risks: None Diabetes: Yes CBG done?: No Did pt. bring in CBG monitor from home?: No  Lab Results  Component Value Date   HGBA1C >15.0 06/14/2023    HGBA1C 11.8 (A) 02/21/2023   HGBA1C >15 (A) 09/21/2022   HGBA1C  09/21/2022     Comment:     Notified Dr Jennette Kettle and Melba Coon     How often do you need to have someone help you when you read instructions, pamphlets, or other written materials from your doctor or pharmacy?: 1 - Never  Interpreter Needed?: No  Information entered by :: [employer] N. Zenora Karpel, LPN.   Activities of Daily Living     08/05/2023    2:28 PM  In your present state of health, do you have any difficulty performing the following activities:  Hearing? 0  Vision? 0  Difficulty concentrating or making decisions? 0  Walking or climbing stairs? 0  Dressing or bathing? 0  Doing errands, shopping? 0  Preparing Food and eating ? N  Using the Toilet? N  In the past six months, have you accidently leaked urine? N  Do you have problems with loss of bowel control? N  Managing your Medications? N  Managing your Finances? N  Housekeeping or managing your Housekeeping? N    Patient Care Team: [name], MD as PCP - General (Family Medicine) Chilton Si, MD as PCP - Cardiology (Cardiology) Linna Darner, RD as Dietitian (Family Medicine) Sallye Lat, MD as Consulting Physician (Ophthalmology) Helane Gunther, DPM as Consulting Physician (Podiatry)  Indicate any recent Medical Services you may have received from other than [facility] providers in the past year (date may be approximate).     Assessment:   This is a routine wellness examination for Mallory Hayes.  Hearing/Vision screen Hearing Screening - Comments:: Denies hearing difficulties.  Vision Screening - Comments:: Wears readers, cataracts removed from left eye 06/2023 - up to date with routine eye exams with Marchelle Gearing, MD.    Goals Addressed   None    Depression Screen     08/05/2023    2:29 PM 06/17/2023    1:41 PM 06/14/2023    1:45 PM 02/21/2023    3:23 PM 01/10/2023    3:16 PM 11/20/2022   11:37 AM 10/30/2022    2:58 PM  PHQ 2/9  Scores   PHQ - 2 Score 0 2 1 1  0 0 6  PHQ- 9 Score 2 5  4  1      Fall Risk     08/05/2023    2:27 PM 06/17/2023    1:41 PM 06/14/2023    1:31 PM 02/21/2023    3:22 PM 01/10/2023    3:16 PM  Fall Risk   Falls in the past year? 0 0 0 0 0  Number falls in past yr: 0 0 0 0   Injury with Fall? 0 0 0 0   Risk for fall due to : No Fall Risks      Follow up Falls prevention discussed;Falls evaluation completed        MEDICARE RISK AT HOME:  Medicare Risk at Home Any stairs in or around the home?: No If so, are there any without handrails?: No Home free of loose throw rugs in walkways, pet beds, electrical cords, etc?: Yes Adequate lighting in your home to reduce risk of falls?: Yes Life alert?: No Use of a cane, walker or w/c?: No Grab bars in the bathroom?: Yes Shower chair or bench in shower?: No Elevated toilet seat or a handicapped toilet?: No  TIMED UP AND GO:  Was the test performed?  No  Cognitive Function: 6CIT completed    08/05/2023    2:28 PM  MMSE - Mini Mental State Exam  Not completed: Unable to complete        08/05/2023    2:28 PM 07/03/2022   11:42 AM 09/05/2020    4:38 PM  6CIT Screen  What Year? 0 points 0 points 0 points  What month? 0 points 0 points 0 points  What time? 0 points 0 points 0 points  Count back from 20 0 points 0 points 0 points  Months in reverse 0 points 0 points 0 points  Repeat phrase 0 points 0 points 0 points  Total Score 0 points 0 points 0 points    Immunizations Immunization History  Administered Date(s) Administered   Fluad Quad(high Dose 65+) 01/19/2019   H1N1 04/28/2008   Influenza Split 02/02/2011, 05/12/2012   Influenza Whole 02/28/2005, 03/06/2007, 04/28/2008, 02/28/2009, 03/01/2010   Influenza,inj,Quad PF,6+ Mos 02/19/2013, 03/10/2014, 01/05/2015, 02/21/2016, 03/29/2017, 05/11/2020, 02/13/2022   Influenza-Unspecified 04/08/2018, 01/10/2021   PFIZER Comirnaty(Gray Top)Covid-19 Tri-Sucrose Vaccine 08/11/2020   PFIZER(Purple  Top)SARS-COV-2 Vaccination 07/01/2019, 07/30/2019, 04/06/2020   Pfizer Covid-19 Vaccine Bivalent Booster 57yrs & up 02/09/2021   Pfizer(Comirnaty)Fall Seasonal Vaccine 12 years and older 05/21/2021   Pneumococcal Conjugate-13 12/02/2014   Pneumococcal Polysaccharide-23 03/06/2007, 11/21/2011   Td 02/28/2005   Td (Adult),5 Lf Tetanus Toxid, Preservative Free 02/28/2005   Unspecified SARS-COV-2 Vaccination 02/18/2022    Screening Tests Health Maintenance  Topic Date Due   Zoster Vaccines- Shingrix (1 of 2) Never done   DTaP/Tdap/Td (3 - Tdap) 03/01/2015   COVID-19 Vaccine (8 - 2024-25 season) 12/30/2022   FOOT EXAM  09/21/2023   INFLUENZA VACCINE  11/29/2023   HEMOGLOBIN A1C  12/12/2023   Diabetic kidney evaluation - eGFR measurement  03/05/2024   Diabetic kidney evaluation - Urine ACR  03/05/2024   OPHTHALMOLOGY EXAM  07/09/2024   Medicare Annual Wellness (AWV)  08/04/2024   Pneumonia Vaccine 58+ Years old  Completed   DEXA SCAN  Completed   Hepatitis C Screening  Completed   HPV VACCINES  Aged Out   Colonoscopy  Discontinued    Health Maintenance  Health Maintenance Due  Topic Date Due  Zoster Vaccines- Shingrix (1 of 2) Never done   DTaP/Tdap/Td (3 - Tdap) 03/01/2015   COVID-19 Vaccine (8 - 2024-25 season) 12/30/2022   Health Maintenance Items Addressed: Yes; Patient is due for the following vaccines: Covid, Dtap and Shingrix.  Additional Screening:  Vision Screening: Recommended annual ophthalmology exams for early detection of glaucoma and other disorders of the eye.  Dental Screening: Recommended annual dental exams for proper oral hygiene  Community Resource Referral / Chronic Care Management: CRR required this visit?  No   CCM required this visit?  No     Plan:     I have personally reviewed and noted the following in the patient's chart:   Medical and social history Use of alcohol, tobacco or illicit drugs  Current medications and supplements  including opioid prescriptions. Patient is not currently taking opioid prescriptions. Functional ability and status Nutritional status Physical activity Advanced directives List of other physicians Hospitalizations, surgeries, and ER visits in previous 12 months Vitals Screenings to include cognitive, depression, and falls Referrals and appointments  In addition, I have reviewed and discussed with patient certain preventive protocols, quality metrics, and best practice recommendations. A written personalized care plan for preventive services as well as general preventive health recommendations were provided to patient.     Mickeal Needy, LPN   05/05/1094   After Visit Summary: (Declined) Due to this being a telephonic visit, with patients personalized plan was offered to patient but patient Declined AVS at this time   Notes: Nothing significant to report at this time.

## 2023-08-05 NOTE — Patient Instructions (Signed)
 Mallory Hayes , Thank you for taking time to come for your Medicare Wellness Visit. I appreciate your ongoing commitment to your health goals. Please review the following plan we discussed and let me know if I can assist you in the future.   Referrals/Orders/Follow-Ups/Clinician Recommendations: Keep maintaining your health by keeping your appointments with Dr. Pollie Meyer and any specialists that you may see.  Call us if you need anything.  Have a great year!!!!  This is a list of the screening recommended for you and due dates:  Health Maintenance  Topic Date Due   Zoster (Shingles) Vaccine (1 of 2) Never done   DTaP/Tdap/Td vaccine (3 - Tdap) 03/01/2015   COVID-19 Vaccine (8 - 2024-25 season) 12/30/2022   Complete foot exam   09/21/2023   Flu Shot  11/29/2023   Hemoglobin A1C  12/12/2023   Yearly kidney function blood test for diabetes  03/05/2024   Yearly kidney health urinalysis for diabetes  03/05/2024   Eye exam for diabetics  07/09/2024   Medicare Annual Wellness Visit  08/04/2024   Pneumonia Vaccine  Completed   DEXA scan (bone density measurement)  Completed   Hepatitis C Screening  Completed   HPV Vaccine  Aged Out   Colon Cancer Screening  Discontinued    Advanced directives: (Declined) Advance directive discussed with you today. Even though you declined this today, please call our office should you change your mind, and we can give you the proper paperwork for you to fill out.  Next Medicare Annual Wellness Visit scheduled for next year: Yes

## 2023-08-06 ENCOUNTER — Ambulatory Visit: Admitting: Pharmacist

## 2023-08-06 ENCOUNTER — Encounter: Payer: Self-pay | Admitting: Pharmacist

## 2023-08-06 VITALS — BP 117/43 | HR 51 | Wt 107.6 lb

## 2023-08-06 DIAGNOSIS — Z7985 Long-term (current) use of injectable non-insulin antidiabetic drugs: Secondary | ICD-10-CM | POA: Diagnosis not present

## 2023-08-06 DIAGNOSIS — E7800 Pure hypercholesterolemia, unspecified: Secondary | ICD-10-CM | POA: Diagnosis not present

## 2023-08-06 DIAGNOSIS — I119 Hypertensive heart disease without heart failure: Secondary | ICD-10-CM | POA: Diagnosis not present

## 2023-08-06 DIAGNOSIS — E1169 Type 2 diabetes mellitus with other specified complication: Secondary | ICD-10-CM | POA: Diagnosis not present

## 2023-08-06 MED ORDER — INSULIN GLARGINE-YFGN 100 UNIT/ML ~~LOC~~ SOPN: 8.0000 [IU] | PEN_INJECTOR | Freq: Every day | SUBCUTANEOUS | Status: DC

## 2023-08-06 NOTE — Progress Notes (Signed)
 S:     Chief Complaint  Patient presents with   Medication Management    Diabetes f/u   78 y.o. female who presents for diabetes evaluation, education, and management. Patient arrives in good spirits and presents without any assistance.   Patient was referred and last seen by Primary Care Provider, Dr. Marisue Humble, on 06/17/23.   PMH is significant for DM, HTN, anxiety, CKD.  At last visit, Semglee (insulin glargine) was decreased from 18 to 10 units daily. Patient started Trulicity (dulaglutide) 0.75 mg.   Current diabetes medications include: metformin 500 mg BID, dulaglutide (Trulicity) 0.75 mg weekly (Friday), insulin glargine (Semglee) 10 units Current hypertension medications include: carvedilol (coreg) 3.125 mg BID, olmesartan-amlodipine-hydrochlorothiazide 40-10-25 mg Current hyperlipidemia medications include: rosuvastatin (Crestor) 20 mg daily  Patient reports adherence to taking all medications as prescribed.   Do you feel that your medications are working for you? yes Have you been experiencing any side effects to the medications prescribed? Yes, patient reports upset GI symptoms from Trulicity (dulaglutide), resulting in decreased appetite. Do you have any problems obtaining medications due to transportation or finances? no Insurance coverage: [facility] Medicare  Patient reports hypoglycemic events. CGM revealed one hypoglycemic event overnight.  Patient reports feeling "uncomfortable" around her chest, described as minimal pain.  Patient scheduled for right eye examination on 4/23 for second cataract surgery.   O:   Review of Systems  Eyes:        Vision improved POST cataract surgery.   Psychiatric/Behavioral:  The patient is nervous/anxious.   All other systems reviewed and are negative.   Physical Exam Vitals reviewed.  Constitutional:      Appearance: Normal appearance.  Pulmonary:     Effort: Pulmonary effort is normal.  Neurological:     Mental  Status: She is alert.  Psychiatric:        Mood and Affect: Mood normal.        Behavior: Behavior normal.        Thought Content: Thought content normal.        Judgment: Judgment normal.     Libre3 CGM Download today 08/06/23 % Time CGM is active: 97% Average Glucose: 187 mg/dL Glucose Management Indicator: 7.8  Glucose Variability: 29.8% (goal <36%) Time in Goal:  - Time in range 70-180: 50% - Time above range: 50% (over last 5 days - 0% of readings > 250) - Time below range: 0% Observed patterns: Improved following a week after starting Trulicity and new insulin regimen. Reports one hypoglycemic event overnight.   Lab Results  Component Value Date   HGBA1C >15.0 06/14/2023   Vitals:   08/06/23 1330  BP: (!) 117/43  Pulse: (!) 51  SpO2: 100%    Lipid Panel     Component Value Date/Time   CHOL 179 03/06/2023 1451   CHOL 145 11/20/2022 1502   TRIG 86.0 03/06/2023 1451   HDL 76.80 03/06/2023 1451   HDL 68 11/20/2022 1502   CHOLHDL 2 03/06/2023 1451   VLDL 17.2 03/06/2023 1451   LDLCALC 85 03/06/2023 1451   LDLCALC 62 11/20/2022 1502   LDLDIRECT 62 07/28/2012 0927    Clinical Atherosclerotic Cardiovascular Disease (ASCVD): Yes  The 10-year ASCVD risk score (Arnett DK, et al., 2019) is: 32.1%   Values used to calculate the score:     Age: 38 years     Sex: Female     Is Non-Hispanic African American: Yes     Diabetic: Yes  Tobacco smoker: No     Systolic Blood Pressure: 117 mmHg     Is BP treated: Yes     HDL Cholesterol: 76.8 mg/dL     Total Cholesterol: 179 mg/dL   A/P: Diabetes longstanding currently much improved with use of low-dose insulin and low-dose GLP. Patient is able to verbalize appropriate hypoglycemia management plan. Medication adherence appears good. Control is greatly improved following new changes upon last visit. Patient remains more in target glucose range with one hypoglycemic event overnight. -Decreased dose of basal insulin Semglee  (insulin glargine) from 10 units to 8 units daily in the morning.  -Continued GLP-1 Trulicity (dulaglutide) 0.75 mg weekly. Discussed how tolerability improves with additional doses.  We agreed to continue this medication at this time to determine tolerability after multiple doses.  -Continued metformin 500 mg BID.  -Patient educated on purpose, proper use, and potential adverse effects.  -Extensively discussed pathophysiology of diabetes, recommended lifestyle interventions, dietary effects on blood sugar control.  -Counseled on s/sx of and management of hypoglycemia.  -Next A1c anticipated May 2025 - at next appointment with PCP  ASCVD risk - primary prevention in patient with diabetes. Last LDL is at goal of <70 mg/dL. ASCVD risk factors include DM and 10-year ASCVD risk score of 39.7.  -Continued rosuvastatin 20 mg   Hypertension longstanding currently at goal. Blood pressure goal remains <130/80 mmHg. Medication adherence good. Persistent bradycardia is concerning.  Patient is asymptomatic with her current heart rate.  -Continued carvedilol (coreg) 3.125 mg BID -Continued olmesartan-amlodipine-hydrochlorothiazide 40-10-25 mg   Written patient instructions provided. Patient verbalized understanding of treatment plan.  Total time in face to face counseling 34 minutes.    Follow-up:  Pharmacist PRN - refer to see her at any time PCP clinic visit in 09/12/23 Patient seen with Threasa Heads, PharmD Candidate and Darolyn Rua, PharmD Candidate.

## 2023-08-06 NOTE — Progress Notes (Signed)
 Reviewed and agree with Dr Macky Lower plan.

## 2023-08-06 NOTE — Patient Instructions (Signed)
 It was nice to see you today!  Your goal blood sugar is 80-130 before eating and less than 180 after eating.  Medication Changes: Decrease Semglee from 10 units to 8 units daily in the morning.  Continue all other medication the same.   Keep up the good work with diet and exercise. Aim for a diet full of vegetables, fruit and lean meats (chicken, [place], fish). Try to limit salt intake by eating fresh or frozen vegetables (instead of canned), rinse canned vegetables prior to cooking and do not add any additional salt to meals.

## 2023-08-06 NOTE — Assessment & Plan Note (Signed)
 ASCVD risk - primary prevention in patient with diabetes. Last LDL is at goal of <70 mg/dL. ASCVD risk factors include DM and 10-year ASCVD risk score of 39.7.  -Continued rosuvastatin 20 mg

## 2023-08-06 NOTE — Assessment & Plan Note (Signed)
 Hypertension longstanding currently at goal. Blood pressure goal remains <130/80 mmHg. Medication adherence good. Persistent bradycardia is concerning.  Patient is asymptomatic with her current heart rate.  -Continued carvedilol (coreg) 3.125 mg BID -Continued olmesartan-amlodipine-hydrochlorothiazide 40-10-25 mg

## 2023-08-06 NOTE — Assessment & Plan Note (Signed)
 Diabetes longstanding currently much improved with use of low-dose insulin and low-dose GLP. Patient is able to verbalize appropriate hypoglycemia management plan. Medication adherence appears good. Control is greatly improved following new changes upon last visit. Patient remains more in target glucose range with one hypoglycemic event overnight. -Decreased dose of basal insulin Semglee (insulin glargine) from 10 units to 8 units daily in the morning.  -Continued GLP-1 Trulicity (dulaglutide) 0.75 mg weekly. Discussed how tolerability improves with additional doses.  We agreed to continue this medication at this time to determine tolerability after multiple doses.  -Continued metformin 500 mg BID.

## 2023-08-30 ENCOUNTER — Encounter: Payer: Self-pay | Admitting: Podiatry

## 2023-08-30 ENCOUNTER — Ambulatory Visit (INDEPENDENT_AMBULATORY_CARE_PROVIDER_SITE_OTHER): Admitting: Podiatry

## 2023-08-30 DIAGNOSIS — B351 Tinea unguium: Secondary | ICD-10-CM

## 2023-08-30 DIAGNOSIS — M79674 Pain in right toe(s): Secondary | ICD-10-CM | POA: Diagnosis not present

## 2023-08-30 DIAGNOSIS — E1169 Type 2 diabetes mellitus with other specified complication: Secondary | ICD-10-CM | POA: Diagnosis not present

## 2023-08-30 DIAGNOSIS — M79675 Pain in left toe(s): Secondary | ICD-10-CM

## 2023-08-30 NOTE — Progress Notes (Signed)
 This patient returns to my office for at risk foot care.  This patient requires this care by a professional since this patient will be at risk due to having CKD stage 3 and type 2 diabetes.   This patient is unable to cut nails herself since the patient cannot reach her nails.These nails are painful walking and wearing shoes.  This patient presents for at risk foot care today.  General Appearance  Alert, conversant and in no acute stress.  Vascular  Dorsalis pedis and posterior tibial  pulses are palpable  bilaterally.  Capillary return is within normal limits  bilaterally. Temperature is within normal limits  bilaterally.  Neurologic  Senn-Weinstein monofilament wire test within normal limits  bilaterally. Muscle power within normal limits bilaterally.  Nails Thick disfigured discolored nails with subungual debris  from hallux to fifth toes bilaterally. No evidence of bacterial infection or drainage bilaterally.  Orthopedic  No limitations of motion  feet .  No crepitus or effusions noted.  No bony pathology or digital deformities noted.  HAV  B/L and tailors bunion  B/L.  Skin  normotropic skin with no porokeratosis noted bilaterally.  No signs of infections or ulcers noted.     Onychomycosis  Pain in right toes  Pain in left toes  Consent was obtained for treatment procedures.   Mechanical debridement of nails 1-5  bilaterally performed with a nail nipper.  Filed with dremel without incident.    Return office visit     3 months                 Told patient to return for periodic foot care and evaluation due to potential at risk complications.   Helane Gunther DPM

## 2023-09-04 ENCOUNTER — Telehealth: Payer: Self-pay | Admitting: Family Medicine

## 2023-09-04 NOTE — Telephone Encounter (Signed)
 Received mailed form requesting verification of chronic condition from patient, for [facility] C-SNP program.  Form signed Will return to RN team to coordinate faxing back to [facility] and informing patient  Candee Cha, MD

## 2023-09-05 ENCOUNTER — Other Ambulatory Visit: Payer: Self-pay | Admitting: Family Medicine

## 2023-09-05 DIAGNOSIS — Z1231 Encounter for screening mammogram for malignant neoplasm of breast: Secondary | ICD-10-CM

## 2023-09-05 NOTE — Telephone Encounter (Signed)
 Called patient. LVM that this has been completed.   Elsie Halo, RN

## 2023-09-05 NOTE — Telephone Encounter (Signed)
 Received form. Form faxed back to provided number.   Copy made and placed in batch scanning.   Elsie Halo, RN

## 2023-09-06 ENCOUNTER — Telehealth: Payer: Self-pay | Admitting: Cardiovascular Disease

## 2023-09-06 NOTE — Telephone Encounter (Signed)
 Information flyers mailed to patient, left message on VM (ok per DPR)

## 2023-09-06 NOTE — Telephone Encounter (Signed)
 Patient would like to know if Dr. Theodis Fiscal has a food chart she should be following for dietary purposes. Please advise.

## 2023-09-12 ENCOUNTER — Ambulatory Visit (INDEPENDENT_AMBULATORY_CARE_PROVIDER_SITE_OTHER): Admitting: Family Medicine

## 2023-09-12 ENCOUNTER — Other Ambulatory Visit: Payer: Self-pay | Admitting: Family Medicine

## 2023-09-12 ENCOUNTER — Encounter: Payer: Self-pay | Admitting: Family Medicine

## 2023-09-12 VITALS — BP 117/56 | HR 52 | Ht 61.0 in | Wt 106.0 lb

## 2023-09-12 DIAGNOSIS — S51812A Laceration without foreign body of left forearm, initial encounter: Secondary | ICD-10-CM

## 2023-09-12 DIAGNOSIS — E1169 Type 2 diabetes mellitus with other specified complication: Secondary | ICD-10-CM | POA: Diagnosis not present

## 2023-09-12 DIAGNOSIS — Z23 Encounter for immunization: Secondary | ICD-10-CM | POA: Diagnosis not present

## 2023-09-12 DIAGNOSIS — I10 Essential (primary) hypertension: Secondary | ICD-10-CM

## 2023-09-12 DIAGNOSIS — Z0000 Encounter for general adult medical examination without abnormal findings: Secondary | ICD-10-CM

## 2023-09-12 LAB — POCT GLYCOSYLATED HEMOGLOBIN (HGB A1C): HbA1c, POC (controlled diabetic range): 8 % — AB (ref 0.0–7.0)

## 2023-09-12 MED ORDER — SHINGRIX 50 MCG/0.5ML IM SUSR: INTRAMUSCULAR | 1 refills | Status: AC

## 2023-09-12 NOTE — Patient Instructions (Signed)
 It was great to see you again today.  Checking kidney function today Tetanus booster today  Continue current medications  Take shingles vaccine prescription to your pharmacy   Be well, Dr. Dawn Eth

## 2023-09-12 NOTE — Progress Notes (Signed)
  Date of Visit: 09/12/2023   SUBJECTIVE:   HPI:  Mallory Hayes presents today for routine follow-up.  Diabetes: Currently taking Semglee  8 units daily, metformin  XR 500 mg twice daily and Trulicity  0.75 mg once a week.  Tolerating these well.  Wears a libre 3 CGM.  Reports occasional low sugars in the 60s when she is outside working in her yard, but her CGM alerts her and she eats when this happens.  Feels fine generally, reports this occurs rarely.  Hypertension: Currently taking olmesartan -amlodipine -HCTZ 40-10-25 mg once daily and carvedilol  3.125 mg twice daily.  Tolerating this well.  Cut on left forearm: Noticed she has a small cut on her left forearm, occurred earlier this week.  No drainage or pain.  She is concerned that her Tdap is not current.  OBJECTIVE:   BP (!) 117/56   Pulse (!) 52   Ht 5\' 1"  (1.549 m)   Wt 106 lb (48.1 kg)   SpO2 100%   BMI 20.03 kg/m  Gen: No acute distress, pleasant, cooperative, well-appearing HEENT: Normocephalic, atraumatic Heart: Regular rate and rhythm, no murmur Lungs: Clear bilaterally, normal effort Neuro: Grossly nonfocal, normal Ext: Left forearm has 0.5 cm abrasion with slight scab over it, no surrounding erythema or drainage, no signs of infection  ASSESSMENT/PLAN:   Assessment & Plan Type 2 diabetes mellitus with other specified complication, without long-term current use of insulin  (HCC) A1c greatly improved to 8.  She will continue her current medication regimen. Assisted her with applying and initiating her CGM today. Follow-up in 3 months BMP today Laceration without foreign body of left forearm, initial encounter Tdap updated today Monitor for any signs of infection Routine adult health maintenance Given prescriptions for Shingrix  to get at her pharmacy Tdap updated today Current on COVID-vaccine Essential hypertension Well controlled. Continue current medication regimen.     FOLLOW UP: Follow up in 3 mos for next  A1c  Mallory J. Dawn Eth, MD [facility]

## 2023-09-13 ENCOUNTER — Ambulatory Visit: Payer: Self-pay | Admitting: Family Medicine

## 2023-09-13 LAB — BASIC METABOLIC PANEL WITH GFR
BUN/Creatinine Ratio: 20 (ref 12–28)
BUN: 28 mg/dL — ABNORMAL HIGH (ref 8–27)
CO2: 23 mmol/L (ref 20–29)
Calcium: 10.2 mg/dL (ref 8.7–10.3)
Chloride: 99 mmol/L (ref 96–106)
Creatinine, Ser: 1.38 mg/dL — ABNORMAL HIGH (ref 0.57–1.00)
Glucose: 120 mg/dL — ABNORMAL HIGH (ref 70–99)
Potassium: 3.8 mmol/L (ref 3.5–5.2)
Sodium: 138 mmol/L (ref 134–144)
eGFR: 40 mL/min/{1.73_m2} — ABNORMAL LOW (ref >59)

## 2023-09-14 DIAGNOSIS — Z0000 Encounter for general adult medical examination without abnormal findings: Secondary | ICD-10-CM | POA: Insufficient documentation

## 2023-09-14 NOTE — Assessment & Plan Note (Signed)
 Given prescriptions for Shingrix  to get at her pharmacy Tdap updated today Current on COVID-vaccine

## 2023-09-14 NOTE — Assessment & Plan Note (Signed)
 Well controlled. Continue current medication regimen.

## 2023-09-14 NOTE — Assessment & Plan Note (Signed)
 A1c greatly improved to 8.  She will continue her current medication regimen. Assisted her with applying and initiating her CGM today. Follow-up in 3 months BMP today

## 2023-09-16 ENCOUNTER — Telehealth: Payer: Self-pay

## 2023-09-16 NOTE — Telephone Encounter (Signed)
 Patient calls nurse line requesting samples of pen needles.   Spoke with Dr. Koval. He provided 1 box of pen needles for patient.   Labeled box and placed at front desk for pick up.   Elsie Halo, RN

## 2023-09-17 ENCOUNTER — Other Ambulatory Visit: Payer: Self-pay | Admitting: Family Medicine

## 2023-09-25 ENCOUNTER — Other Ambulatory Visit: Payer: Self-pay | Admitting: *Deleted

## 2023-09-26 MED ORDER — FREESTYLE LIBRE 3 READER DEVI: 0 refills | Status: AC

## 2023-10-01 ENCOUNTER — Other Ambulatory Visit: Payer: Self-pay | Admitting: Family Medicine

## 2023-10-01 ENCOUNTER — Other Ambulatory Visit: Payer: Self-pay

## 2023-10-01 DIAGNOSIS — E1169 Type 2 diabetes mellitus with other specified complication: Secondary | ICD-10-CM

## 2023-10-01 MED ORDER — DROPSAFE ALCOHOL PREP 70 % PADS: MEDICATED_PAD | 3 refills | Status: AC

## 2023-10-01 MED ORDER — INSULIN GLARGINE-YFGN 100 UNIT/ML ~~LOC~~ SOPN: 8.0000 [IU] | PEN_INJECTOR | Freq: Every day | SUBCUTANEOUS | 1 refills | Status: AC

## 2023-10-02 ENCOUNTER — Other Ambulatory Visit: Payer: Self-pay | Admitting: Family Medicine

## 2023-10-08 ENCOUNTER — Telehealth: Payer: Self-pay | Admitting: Family Medicine

## 2023-10-08 NOTE — Telephone Encounter (Signed)
 Delayed entry  Telephone Note Encounter  Patient brought in form during visit to be completed. Form type: insurance form - [employer] Form completed, will be faxed to [facility]  Daleen Dubs, MD  [facility]

## 2023-10-10 ENCOUNTER — Ambulatory Visit

## 2023-10-24 ENCOUNTER — Ambulatory Visit

## 2023-11-07 ENCOUNTER — Ambulatory Visit
Admission: RE | Admit: 2023-11-07 | Discharge: 2023-11-07 | Disposition: A | Discharge status: Home / Self Care | Source: Ambulatory Visit | Attending: Family Medicine | Admitting: Family Medicine

## 2023-11-07 DIAGNOSIS — Z1231 Encounter for screening mammogram for malignant neoplasm of breast: Secondary | ICD-10-CM

## 2023-11-27 DIAGNOSIS — E119 Type 2 diabetes mellitus without complications: Secondary | ICD-10-CM | POA: Diagnosis not present

## 2023-11-27 DIAGNOSIS — H532 Diplopia: Secondary | ICD-10-CM | POA: Diagnosis not present

## 2023-11-27 DIAGNOSIS — Z961 Presence of intraocular lens: Secondary | ICD-10-CM | POA: Diagnosis not present

## 2023-11-27 DIAGNOSIS — H43811 Vitreous degeneration, right eye: Secondary | ICD-10-CM | POA: Diagnosis not present

## 2023-11-27 DIAGNOSIS — H25811 Combined forms of age-related cataract, right eye: Secondary | ICD-10-CM | POA: Diagnosis not present

## 2023-11-27 DIAGNOSIS — H5022 Vertical strabismus, left eye: Secondary | ICD-10-CM | POA: Diagnosis not present

## 2023-11-27 DIAGNOSIS — H5000 Unspecified esotropia: Secondary | ICD-10-CM | POA: Diagnosis not present

## 2023-12-02 DIAGNOSIS — H2511 Age-related nuclear cataract, right eye: Secondary | ICD-10-CM | POA: Diagnosis not present

## 2023-12-04 ENCOUNTER — Ambulatory Visit (INDEPENDENT_AMBULATORY_CARE_PROVIDER_SITE_OTHER): Admitting: Podiatry

## 2023-12-04 ENCOUNTER — Encounter: Payer: Self-pay | Admitting: Podiatry

## 2023-12-04 DIAGNOSIS — B351 Tinea unguium: Secondary | ICD-10-CM

## 2023-12-04 DIAGNOSIS — E1169 Type 2 diabetes mellitus with other specified complication: Secondary | ICD-10-CM

## 2023-12-04 DIAGNOSIS — M79674 Pain in right toe(s): Secondary | ICD-10-CM

## 2023-12-04 DIAGNOSIS — M79675 Pain in left toe(s): Secondary | ICD-10-CM

## 2023-12-04 NOTE — Progress Notes (Signed)
 This patient returns to my office for at risk foot care.  This patient requires this care by a professional since this patient will be at risk due to having CKD stage 3 and type 2 diabetes.   This patient is unable to cut nails herself since the patient cannot reach her nails.These nails are painful walking and wearing shoes.  This patient presents for at risk foot care today.  General Appearance  Alert, conversant and in no acute stress.  Vascular  Dorsalis pedis and posterior tibial  pulses are palpable  bilaterally.  Capillary return is within normal limits  bilaterally. Temperature is within normal limits  bilaterally.  Neurologic  Senn-Weinstein monofilament wire test within normal limits  bilaterally. Muscle power within normal limits bilaterally.  Nails Thick disfigured discolored nails with subungual debris  from hallux to fifth toes bilaterally. No evidence of bacterial infection or drainage bilaterally.  Orthopedic  No limitations of motion  feet .  No crepitus or effusions noted.  No bony pathology or digital deformities noted.  HAV  B/L and tailors bunion  B/L.  Skin  normotropic skin with no porokeratosis noted bilaterally.  No signs of infections or ulcers noted.     Onychomycosis  Pain in right toes  Pain in left toes  Consent was obtained for treatment procedures.   Mechanical debridement of nails 1-5  bilaterally performed with a nail nipper.  Filed with dremel without incident.    Return office visit     3 months                 Told patient to return for periodic foot care and evaluation due to potential at risk complications.   Helane Gunther DPM

## 2023-12-11 ENCOUNTER — Other Ambulatory Visit: Payer: Self-pay | Admitting: Family Medicine

## 2023-12-21 ENCOUNTER — Other Ambulatory Visit: Payer: Self-pay | Admitting: Family Medicine

## 2023-12-26 ENCOUNTER — Other Ambulatory Visit: Payer: Self-pay | Admitting: Family Medicine

## 2023-12-26 DIAGNOSIS — H2511 Age-related nuclear cataract, right eye: Secondary | ICD-10-CM | POA: Diagnosis not present

## 2023-12-26 NOTE — Telephone Encounter (Signed)
Please let patient know I am refilling this medication, but she needs to schedule an appointment with me.   Thanks, [name], MD

## 2024-01-11 ENCOUNTER — Other Ambulatory Visit: Payer: Self-pay | Admitting: Family Medicine

## 2024-01-11 DIAGNOSIS — I10 Essential (primary) hypertension: Secondary | ICD-10-CM

## 2024-01-24 ENCOUNTER — Ambulatory Visit (HOSPITAL_BASED_OUTPATIENT_CLINIC_OR_DEPARTMENT_OTHER): Admitting: Family

## 2024-01-28 ENCOUNTER — Encounter (HOSPITAL_BASED_OUTPATIENT_CLINIC_OR_DEPARTMENT_OTHER): Payer: Self-pay | Admitting: Family

## 2024-01-28 ENCOUNTER — Ambulatory Visit: Attending: Family

## 2024-01-28 ENCOUNTER — Ambulatory Visit (INDEPENDENT_AMBULATORY_CARE_PROVIDER_SITE_OTHER): Admitting: Family

## 2024-01-28 VITALS — BP 132/68 | HR 40 | Ht 61.0 in | Wt 110.0 lb

## 2024-01-28 DIAGNOSIS — G4733 Obstructive sleep apnea (adult) (pediatric): Secondary | ICD-10-CM | POA: Diagnosis not present

## 2024-01-28 DIAGNOSIS — Z8249 Family history of ischemic heart disease and other diseases of the circulatory system: Secondary | ICD-10-CM | POA: Diagnosis not present

## 2024-01-28 DIAGNOSIS — I350 Nonrheumatic aortic (valve) stenosis: Secondary | ICD-10-CM | POA: Diagnosis not present

## 2024-01-28 DIAGNOSIS — R001 Bradycardia, unspecified: Secondary | ICD-10-CM

## 2024-01-28 DIAGNOSIS — I10 Essential (primary) hypertension: Secondary | ICD-10-CM | POA: Diagnosis not present

## 2024-01-28 DIAGNOSIS — Z9189 Other specified personal risk factors, not elsewhere classified: Secondary | ICD-10-CM

## 2024-01-28 NOTE — Progress Notes (Unsigned)
 Enrolled patient for a 7 day Zio XT monitor to be mailed to patients home  [place] to read

## 2024-01-28 NOTE — Patient Instructions (Addendum)
 Medication Instructions:   DISCONTINUE Coreg .  *If you need a refill on your cardiac medications before your next appointment, please call your pharmacy*  Lab Work:  TODAY!!!!! MAG/THYROID /BMET  If you have labs (blood work) drawn today and your tests are completely normal, you will receive your results only by: MyChart Message (if you have MyChart) OR A paper copy in the mail If you have any lab test that is abnormal or we need to change your treatment, we will call you to review the results.  Testing/Procedures:  GEOFFRY HEWS- Long Term Monitor Instructions  Your physician has requested you wear a ZIO patch monitor for 14 days.  This is a single patch monitor. [employer] supplies one patch monitor per enrollment. Additional stickers are not available. Please do not apply patch if you will be having a Nuclear Stress Test,  Echocardiogram, Cardiac CT, MRI, or Chest Xray during the period you would be wearing the  monitor. The patch cannot be worn during these tests. You cannot remove and re-apply the  ZIO XT patch monitor.  Your ZIO patch monitor will be mailed 3 day USPS to your address on file. It may take 3-5 days  to receive your monitor after you have been enrolled.  Once you have received your monitor, please review the enclosed instructions. Your monitor  has already been registered assigning a specific monitor serial # to you.  Billing and Patient Assistance Program Information  We have supplied [employer] with any of your insurance information on file for billing purposes. [employer] offers a sliding scale Patient Assistance Program for patients that do not have  insurance, or whose insurance does not completely cover the cost of the ZIO monitor.  You must apply for the Patient Assistance Program to qualify for this discounted rate.  To apply, please call [employer] at [phone], select option 4, select option 2, ask to apply for  Patient Assistance Program. Meredeth will ask your  household income, and how many people  are in your household. They will quote your out-of-pocket cost based on that information.  [employer] will also be able to set up a 44-month, interest-free payment plan if needed.  Applying the monitor   Shave hair from upper left chest.  Hold abrader disc by orange tab. Rub abrader in 40 strokes over the upper left chest as  indicated in your monitor instructions.  Clean area with 4 enclosed alcohol  pads. Let dry.  Apply patch as indicated in monitor instructions. Patch will be placed under collarbone on left  side of chest with arrow pointing upward.  Rub patch adhesive wings for 2 minutes. Remove white label marked 1. Remove the white  label marked 2. Rub patch adhesive wings for 2 additional minutes.  While looking in a mirror, press and release button in center of patch. A small green light will  flash 3-4 times. This will be your only indicator that the monitor has been turned on.  Do not shower for the first 24 hours. You may shower after the first 24 hours.  Press the button if you feel a symptom. You will hear a small click. Record Date, Time and  Symptom in the Patient Logbook.  When you are ready to remove the patch, follow instructions on the last 2 pages of Patient  Logbook. Stick patch monitor onto the last page of Patient Logbook.  Place Patient Logbook in the blue and white box. Use locking tab on box and tape box closed  securely. The blue  and white box has prepaid postage on it. Please place it in the mailbox as  soon as possible. Your physician should have your test results approximately 7 days after the  monitor has been mailed back to [facility].  Call [facility] Customer Care at [phone] if you have questions regarding  your ZIO XT patch monitor. Call them immediately if you see an orange light blinking on your  monitor.  If your monitor falls off in less than 4 days, contact our Monitor department at [phone].   If your monitor becomes loose or falls off after 4 days call [employer] at [phone] for  suggestions on securing your monitor   Follow-Up: At [facility], you and your health needs are our priority.  As part of our continuing mission to provide you with exceptional heart care, our providers are all part of one team.  This team includes your primary Cardiologist (physician) and Advanced Practice Providers or APPs (Physician Assistants and Nurse Practitioners) who all work together to provide you with the care you need, when you need it.  Your next appointment:   7 week(s)  Provider:   Reche Finder, NP    We recommend signing up for the patient portal called MyChart.  Sign up information is provided on this After Visit Summary.  MyChart is used to connect with patients for Virtual Visits (Telemedicine).  Patients are able to view lab/test results, encounter notes, upcoming appointments, etc.  Non-urgent messages can be sent to your provider as well.   To learn more about what you can do with MyChart, go to [email or web address].   Other Instructions  Reche will wait for your results of your blood work to determine what imaging test should be ordered to look at your arteries.

## 2024-01-28 NOTE — Progress Notes (Signed)
 Cardiology Office Note:  .   Date:  01/28/2024  ID:  Mallory Hayes, DOB 1945-07-03, MRN [id number] PCP: Mallory Laymon PARAS, MD  [facility] Providers Cardiologist:  Mallory Scarce, MD    History of Present Illness: .   Mallory Hayes is a 78 y.o. female with a hx of hypertension, hyperlipidemia, OSA on CPAP, DM2, mild aortic stenosis, bradycardia.   She was initially referred September 2017 with bradycardia which have been present for many years.  She was active without exertional symptoms.  Echo with EF 55 to 60%, grade 1 diastolic dysfunction.  Lexiscan  Myoview  July 2021 due to chest pain while doing yard work negative for ischemia.  Echo 01/2018 with LVEF 60 to 65%, grade 2 diastolic dysfunction, mild aortic stenosis.  Ambulatory monitor due to palpitations 01/2020 with occasional PAC and PVC.  She was switched to Tribenzor from lisinopril , amlodipine , furosemide .  However was concerned about side effects and was transition back to amlodipine , lisinopril , furosemide .  Her furosemide  was transitioned to hydrochlorothiazide .    Seen 08/22/21 with BP not at goal and Chlorthalidone  12.5 mg initiated. Echo 09/2021 LVEF 60-65%, moderate LVH, aortic sclerosis without stenosis. She felt dizzy on chlorthalidone  and was instead started on Carvedilol .   At visit 01/18/23 BP was well controlled, her regimen of Olmesartan -Amlodipine -hydrochlorothiazide  40-10-25mg  daily and Coreg  3.125mg  BID were continued. Due to family history of CAD, coronary calcium  score ordered. Cardiac CTA deferred due to renal function. Coronary calcium  score was not performed.   Presents today for follow up. She is doing well from a cardiac standpoint without chest pain, pressure, tightness, shortness of breath, lightheadedness, dizziness, presyncope, edema. She is active with gardening and enjoys cultivating her garden, specifically marigolds. She is able to do gardening and raking the yard without chest pain and shortness of  breath. She sleeps about 6 hours/night and wakes up frequently to urinate. She takes olmesartan -amlodipine -hydrochlorothiazide  in AM. She does not drink caffeine with dinner or liquids within 1 hour of bedtime. Last week she experienced systemic weakness, more notably in bilateral LE, which limited her normal activities and she required more rest. She remained well hydrated. She feels better this week.  She expresses concern about her low heart rate and if she has calcium  built up in her heart.   ROS: Please see the history of present illness.    All other systems reviewed and are negative.   Studies Reviewed: SABRA   EKG Interpretation Date/Time:  Tuesday January 28 2024 13:29:53 EDT Ventricular Rate:  40 PR Interval:  138 QRS Duration:  80 QT Interval:  476 QTC Calculation: 387 R Axis:   55  Text Interpretation: Marked sinus bradycardia Confirmed by Vannie Mora ([id number]) on 01/28/2024 1:39:47 PM    Cardiac Studies & Procedures   ______________________________________________________________________________________________   STRESS TESTS  MYOCARDIAL PERFUSION IMAGING 11/25/2019  Interpretation Summary  There were transient T wave changes in the inferolateral leads during infusion that rapidly reolved in recovery  Nuclear stress EF: 58%.  The left ventricular ejection fraction is normal (55-65%).  The study is normal.  This is a low risk study.   ECHOCARDIOGRAM  ECHOCARDIOGRAM COMPLETE 09/28/2021  Narrative ECHOCARDIOGRAM REPORT    Patient Name:   Mallory Hayes Date of Exam: 09/28/2021 Medical Rec #:  [id number]    Height:       61.0 in Accession #:    [id number]   Weight:       107.2 lb Date of Birth:  [birth year]    BSA:  1.449 m Patient Age:    75 years     BP:           125/70 mmHg Patient Gender: F            HR:           50 bpm. Exam Location:  Outpatient  Procedure: 2D Echo, Cardiac Doppler, Color Doppler and Limited Color Doppler  Indications:     I35.0 Nonrheumatic aortic (valve) stenosis  History:        Patient has prior history of Echocardiogram examinations, most recent 02/03/2018. CHF, Aortic Valve Disease; Risk Factors:Hypertension, Sleep Apnea, Dyslipidemia, Diabetes, CKD stage 3 and Non-Smoker. Patient denies chest pain, SOB and leg edema.  Sonographer:    Mallory Hayes RVT, RDCS (AE), RDMS Referring Phys: [phone] [facility]   Sonographer Comments: Suboptimal parasternal window. Tight rib spaces. IMPRESSIONS   1. GLS is borderline low. Left ventricular ejection fraction, by estimation, is 60 to 65%. The left ventricle has normal function. The left ventricle has no regional wall motion abnormalities. There is moderate left ventricular hypertrophy. Left ventricular diastolic parameters are indeterminate. The average left ventricular global longitudinal strain is -17.0 %. 2. Right ventricular systolic function is normal. The right ventricular size is normal. Tricuspid regurgitation signal is inadequate for assessing PA pressure. 3. The mitral valve is grossly normal. No evidence of mitral valve regurgitation. 4. Aortic valve regurgitation is not visualized. Aortic valve sclerosis/calcification is present, without any evidence of aortic stenosis. 5. The inferior vena cava is normal in size with greater than 50% respiratory variability, suggesting right atrial pressure of 3 mmHg.  Comparison(s): No prior Echocardiogram.  FINDINGS Left Ventricle: GLS is borderline low. Left ventricular ejection fraction, by estimation, is 60 to 65%. The left ventricle has normal function. The left ventricle has no regional wall motion abnormalities. The average left ventricular global longitudinal strain is -17.0 %. The left ventricular internal cavity size was normal in size. There is moderate left ventricular hypertrophy. Left ventricular diastolic parameters are indeterminate.  Right Ventricle: The right ventricular size is normal.  Right ventricular systolic function is normal. Tricuspid regurgitation signal is inadequate for assessing PA pressure.  Left Atrium: Left atrial size was normal in size.  Right Atrium: Right atrial size was normal in size.  Pericardium: There is no evidence of pericardial effusion.  Mitral Valve: The mitral valve is grossly normal. There is mild thickening of the mitral valve leaflet(s). No evidence of mitral valve regurgitation.  Tricuspid Valve: The tricuspid valve is normal in structure. Tricuspid valve regurgitation is not demonstrated.  Aortic Valve: Aortic valve regurgitation is not visualized. Aortic valve sclerosis/calcification is present, without any evidence of aortic stenosis. Aortic valve mean gradient measures 5.0 mmHg. Aortic valve peak gradient measures 11.6 mmHg. Aortic valve area, by VTI measures 1.36 cm.  Pulmonic Valve: The pulmonic valve was not well visualized. Pulmonic valve regurgitation is not visualized.  Aorta: The aortic root is normal in size and structure.  Venous: The inferior vena cava is normal in size with greater than 50% respiratory variability, suggesting right atrial pressure of 3 mmHg.  IAS/Shunts: No atrial level shunt detected by color flow Doppler.   LEFT VENTRICLE PLAX 2D LVOT diam:     1.60 cm     Diastology LV SV:         45          LV e' medial:    8.18 cm/s LV SV Index:   31  LV E/e' medial:  6.5 LVOT Area:     2.01 cm    LV e' lateral:   11.10 cm/s LV E/e' lateral: 4.8  LV Volumes (MOD)           2D Longitudinal Strain LV vol d, MOD A2C: 32.6 ml 2D Strain GLS Avg:     -17.0 % LV vol d, MOD A4C: 42.9 ml LV vol s, MOD A2C: 7.6 ml LV vol s, MOD A4C: 15.5 ml LV SV MOD A2C:     25.0 ml 3D Volume EF: LV SV MOD A4C:     42.9 ml 3D EF:        76 % LV SV MOD BP:      27.8 ml LV EDV:       68 ml LV ESV:       16 ml LV SV:        52 ml  RIGHT VENTRICLE RV S prime:     11.30 cm/s TAPSE (M-mode): 2.5 cm  LEFT ATRIUM              Index        RIGHT ATRIUM           Index LA Vol (A2C):   24.8 ml 17.12 ml/m  RA Area:     10.10 cm LA Vol (A4C):   19.6 ml 13.53 ml/m  RA Volume:   20.60 ml  14.22 ml/m LA Biplane Vol: 23.0 ml 15.87 ml/m AORTIC VALVE                     PULMONIC VALVE AV Area (Vmax):    1.29 cm      PV Vmax:       0.97 m/s AV Area (Vmean):   1.36 cm      PV Peak grad:  3.8 mmHg AV Area (VTI):     1.36 cm AV Vmax:           170.00 cm/s AV Vmean:          103.000 cm/s AV VTI:            0.330 m AV Peak Grad:      11.6 mmHg AV Mean Grad:      5.0 mmHg LVOT Vmax:         109.00 cm/s LVOT Vmean:        69.800 cm/s LVOT VTI:          0.223 m LVOT/AV VTI ratio: 0.68  AORTA Ao Root diam: 2.50 cm  MITRAL VALVE MV Area (PHT): 2.16 cm    SHUNTS MV Decel Time: 352 msec    Systemic VTI:  0.22 m MV E velocity: 53.00 cm/s  Systemic Diam: 1.60 cm MV A velocity: 79.60 cm/s MV E/A ratio:  0.67  [place] signed by Ronal Ross Signature Date/Time: 09/28/2021/3:49:38 PM    Final    MONITORS  LONG TERM MONITOR (3-14 DAYS) 02/22/2020  Narrative 3-day ZIO monitor  Quality: Fair.  Baseline artifact. Predominant rhythm: Sinus bradycardia Average heart rate: 55 bpm Max heart rate: 139 bpm Min heart rate: 34 bpm Pauses >2.5 seconds: None  Occasional PVCs and PACs.  Patient did submit a symptom diary.  She pushed the button twice at which time sinus rhythm was noted.   Tiffany C. Raford, MD, [facility] 03/22/2020 4:53 PM       ______________________________________________________________________________________________        Risk Assessment/Calculations:  Physical Exam:   VS:  BP 132/68   Pulse (!) 40   Ht 5' 1 (1.549 m)   Wt 110 lb (49.9 kg)   BMI 20.78 kg/m    Wt Readings from Last 3 Encounters:  01/28/24 110 lb (49.9 kg)  09/12/23 106 lb (48.1 kg)  08/06/23 107 lb 9.6 oz (48.8 kg)    GEN: Well nourished, well developed in no acute  distress NECK: No JVD; No carotid bruits CARDIAC: regular rhythm, bradycardia, no murmurs, rubs, gallops; radial and PT pulses 2+ bilaterally RESPIRATORY:  Clear to auscultation without rales, wheezing or rhonchi  ABDOMEN: Soft, non-tender, non-distended EXTREMITIES:  No edema; No deformity   ASSESSMENT AND PLAN: .    Mild aortic stenosis - Echo 09/2021 normal LVEF 60-65%, no AS. No appreciable murmur on exam. Denies presyncope, syncope, lightheadedness, dizziness, shortness of breath.  Continue rosuvastatin  20mg  daily Will continue to follow clinically, consider repeat echo 2026 or if arrhythmias by ZIO  HTN, goal <130/80 - BP 132/68 in office, not monitored at home. On olmesartan -amlodipine -hydrochlorothiazide  40/10/25mg  every day, carvedilol  3.125mg  BID; 08/2023: BUN 28, creat 1.38, Na 138, K 3.8 Will discontinue carvedilol  3.125mg  BID in setting of bradycardia Continue olmesartan -amlodipine -hydrochlorothiazide  daily If nocturia persists, consider reducing dose of hydrochlorothiazide   Consider addition of hydralazine if additional hypertension treatment is needed  OSA - unable to tolerate CPAP.   DM2 - 08/2023: A1c 8.0 (improved from >15); on metformin  500mg  BID, trulicity  once weekly, insulin  glarine 8U daily; follows with PCP. Continue rosuvastatin  20mg  daily  Bradycardia - asymptomatic; EKG today SB @ 40bpm; 3 day zio in 2021: avg. HR 55, no pauses, occ PACs, PVCs 7 day zio to evaluate persistent bradycardia TSH, magnesium, BMET to assess for hypothyroidism and electrolyte abnormalities   Family history of cardiovascular disease / HLD, LDL goal <70 - 03/2023: LDL 85; no typical anginal symptoms and physical exam reassuring; risk factors: family history, HTN, HLD, uncontrolled T2DM  Due to cardiovascular risk factors and significant family history plan for coronary evaluation with either cardiac CTA, pending GFR, or calcium  score. BMET today, if GFR >35 plan for CCTA if GFR <35 plan  for calcium  score.   Consider repeat lipid panel upon follow up - if LDL not at goal, increase rosuvastatin  to 40mg  daily       Dispo: follow up in 6-8 weeks post testing  Signed, Reche GORMAN Finder, NP

## 2024-01-29 LAB — THYROID PANEL WITH TSH
Free Thyroxine Index: 2.2 (ref 1.2–4.9)
T3 Uptake Ratio: 26 % (ref 24–39)
T4, Total: 8.4 ug/dL (ref 4.5–12.0)
TSH: 2.85 u[IU]/mL (ref 0.450–4.500)

## 2024-01-29 LAB — BASIC METABOLIC PANEL WITH GFR
BUN/Creatinine Ratio: 27 (ref 12–28)
BUN: 34 mg/dL — ABNORMAL HIGH (ref 8–27)
CO2: 19 mmol/L — ABNORMAL LOW (ref 20–29)
Calcium: 9.9 mg/dL (ref 8.7–10.3)
Chloride: 96 mmol/L (ref 96–106)
Creatinine, Ser: 1.25 mg/dL — ABNORMAL HIGH (ref 0.57–1.00)
Glucose: 155 mg/dL — ABNORMAL HIGH (ref 70–99)
Potassium: 3.8 mmol/L (ref 3.5–5.2)
Sodium: 136 mmol/L (ref 134–144)
eGFR: 44 mL/min/1.73 — ABNORMAL LOW (ref >59)

## 2024-01-29 LAB — MAGNESIUM: Magnesium: 2 mg/dL (ref 1.6–2.3)

## 2024-01-30 ENCOUNTER — Ambulatory Visit (HOSPITAL_BASED_OUTPATIENT_CLINIC_OR_DEPARTMENT_OTHER): Payer: Self-pay | Admitting: Family

## 2024-02-03 ENCOUNTER — Telehealth: Payer: Self-pay | Admitting: Cardiovascular Disease

## 2024-02-03 NOTE — Telephone Encounter (Signed)
 Pt would a c/b in regards to coming in to have monitor applied Please Advise

## 2024-02-03 NOTE — Telephone Encounter (Signed)
 Please call Mallory Hayes in monitors at [facility] to schedule an appointment to have your monitor applied at our [facility], ([address] , [place]).  Our number is [phone].

## 2024-02-04 ENCOUNTER — Telehealth: Payer: Self-pay | Admitting: *Deleted

## 2024-02-04 DIAGNOSIS — Z0100 Encounter for examination of eyes and vision without abnormal findings: Secondary | ICD-10-CM | POA: Diagnosis not present

## 2024-02-04 NOTE — Telephone Encounter (Signed)
 Patient returned RN's call regarding results and noted she received her heart monitor and wants assistance getting it placed.

## 2024-02-04 NOTE — Telephone Encounter (Signed)
 Attempting to contact patient to schedule an appointment to have her ZIO patch applied at our [facility] office.

## 2024-02-04 NOTE — Telephone Encounter (Signed)
 02/04/24  4:55 PM Result Note The patient has been notified of the result and verbalized understanding.  All questions (if any) were answered.   While having the pt on the phone, our monitor tech was able to make her an appt at [facility] for her monitor to be placed on this Thursday 10/9 at 12:30 pm.  Pt advised to arrive 15 mins prior to this appt.  Pt aware to bring the monitor with her and address/location provided to the pt.   Pt verbalized understanding and agrees with this plan.

## 2024-02-04 NOTE — Telephone Encounter (Signed)
 The patient has been notified of the result and verbalized understanding.  All questions (if any) were answered.  While having the pt on the phone, our monitor tech was able to make her an appt at [facility] for her monitor to be placed on this Thursday 10/9 at 12:30 pm.  Pt advised to arrive 15 mins prior to this appt.  Pt aware to bring the monitor with her and address/location provided to the pt.  Pt verbalized understanding and agrees with this plan.

## 2024-02-06 ENCOUNTER — Ambulatory Visit: Attending: Cardiology

## 2024-02-11 ENCOUNTER — Telehealth: Payer: Self-pay | Admitting: Cardiovascular Disease

## 2024-02-11 NOTE — Telephone Encounter (Signed)
 Will route this message to our monitor team for further assistance with this matter.

## 2024-02-11 NOTE — Telephone Encounter (Signed)
 Patient says event monitor is very itchy and she would like to know what she can do to relieve itchiness. Please advise.

## 2024-02-11 NOTE — Telephone Encounter (Signed)
 LMVM Ms. Briere has 2 more days left to wear her 7 day ZIO Patch if tolerable.  If she is unable to tolerate the irritation from her monitor, she can remove it and mail it back to [facility] for processing.  Calamine, Caladryl, or Hydrocortisone  Cream can be used to soothe the irritation from the ZIO patch.

## 2024-02-13 ENCOUNTER — Telehealth: Payer: Self-pay

## 2024-02-13 NOTE — Telephone Encounter (Signed)
 Pt LVM on nurse line requesting that PCP call in antibiotic for cough and blocked nasal passage.   Called patient to gather more information and likely schedule appointment.   Patient did not answer, LVM asking that she return call to office.   Chiquita JAYSON English, RN

## 2024-02-18 DIAGNOSIS — I10 Essential (primary) hypertension: Secondary | ICD-10-CM | POA: Diagnosis not present

## 2024-02-18 DIAGNOSIS — R001 Bradycardia, unspecified: Secondary | ICD-10-CM | POA: Diagnosis not present

## 2024-02-21 ENCOUNTER — Encounter (HOSPITAL_BASED_OUTPATIENT_CLINIC_OR_DEPARTMENT_OTHER): Payer: Self-pay | Admitting: Family

## 2024-02-21 ENCOUNTER — Ambulatory Visit (HOSPITAL_BASED_OUTPATIENT_CLINIC_OR_DEPARTMENT_OTHER): Admitting: Family

## 2024-02-21 VITALS — BP 118/64 | HR 50 | Ht 61.0 in | Wt 111.9 lb

## 2024-02-21 DIAGNOSIS — E785 Hyperlipidemia, unspecified: Secondary | ICD-10-CM | POA: Diagnosis not present

## 2024-02-21 DIAGNOSIS — I10 Essential (primary) hypertension: Secondary | ICD-10-CM

## 2024-02-21 DIAGNOSIS — R001 Bradycardia, unspecified: Secondary | ICD-10-CM | POA: Diagnosis not present

## 2024-02-21 NOTE — Patient Instructions (Addendum)
 Medication Instructions:  Continue your current medications *If you need a refill on your cardiac medications before your next appointment, please call your pharmacy*  Lab Work: Recommend checking cholesterol labs next time your primary care does lab work  Testing/Procedures: Your heart monitor looked great! It showed no significant arrhythmias which is a great result. No significant pauses which is reassuring.   Follow-Up: At [facility], you and your health needs are our priority.  As part of our continuing mission to provide you with exceptional heart care, our providers are all part of one team.  This team includes your primary Cardiologist (physician) and Advanced Practice Providers or APPs (Physician Assistants and Nurse Practitioners) who all work together to provide you with the care you need, when you need it.  Your next appointment:   1 year(s)  Provider:   Annabella Scarce, MD or Reche Finder, NP    We recommend signing up for the patient portal called MyChart.  Sign up information is provided on this After Visit Summary.  MyChart is used to connect with patients for Virtual Visits (Telemedicine).  Patients are able to view lab/test results, encounter notes, upcoming appointments, etc.  Non-urgent messages can be sent to your provider as well.   To learn more about what you can do with MyChart, go to [email or web address].   Other Instructions  Recommend Coricidin brand over the counter agents for colds as they will not impact your blood pressure

## 2024-02-21 NOTE — Progress Notes (Signed)
 Cardiology Office Note:  .   Date:  02/21/2024  ID:  Richardson Molt, DOB [birth year], MRN [id number] PCP: Donah Laymon PARAS, MD  [facility] Providers Cardiologist:  Annabella Scarce, MD    History of Present Illness: .   Mallory Hayes is a 78 y.o. female with a hx of hypertension, hyperlipidemia, OSA on CPAP, DM2, mild aortic stenosis, bradycardia.   She was initially referred September 2017 with bradycardia which have been present for many years.  She was active without exertional symptoms.  Echo with EF 55 to 60%, grade 1 diastolic dysfunction.  Lexiscan  Myoview  July 2021 due to chest pain while doing yard work negative for ischemia.  Echo 01/2018 with LVEF 60 to 65%, grade 2 diastolic dysfunction, mild aortic stenosis.  Ambulatory monitor due to palpitations 01/2020 with occasional PAC and PVC.  She was switched to Tribenzor from lisinopril , amlodipine , furosemide .  However was concerned about side effects and was transition back to amlodipine , lisinopril , furosemide .  Her furosemide  was transitioned to hydrochlorothiazide .    Seen 08/22/21 with BP not at goal and Chlorthalidone  12.5 mg initiated. Echo 09/2021 LVEF 60-65%, moderate LVH, aortic sclerosis without stenosis. She felt dizzy on chlorthalidone  and was instead started on Carvedilol .   At visit 01/18/23 BP was well controlled, her regimen of Olmesartan -Amlodipine -hydrochlorothiazide  40-10-25mg  daily and Coreg  3.125mg  BID were continued. Due to family history of CAD, coronary calcium  score ordered. Cardiac CTA deferred due to renal function. Coronary calcium  score was not performed.   She was seen 01/28/24. She noted frequent hypotension but no near syncope, syncope. Carvedilol  discontinued. Subsequent ZIO predominantly sinus rhythm, average HR 52 bpm, rare PVC/PAC with <1% burden, no significant arrhythmias. Triggered episode associated with SB 57 bpm. No significant pauses.   Presents today for follow up. Notes recovering from  slight cold, discussed OTC Coricidin. Reviewed monitor report which was reassuring. BP well controlled at home. Has made dietary changes to follow a more heart healthy diet. Tolerating anithypertensive without issue. Continues to stay active in her yard, raking leaves, etc. Reports no shortness of breath nor dyspnea on exertion. Reports no chest pain, pressure, or tightness. No edema, orthopnea, PND. Reports no palpitations.     ROS: Please see the history of present illness.    All other systems reviewed and are negative.   Studies Reviewed: .        Cardiac Studies & Procedures   ______________________________________________________________________________________________   STRESS TESTS  MYOCARDIAL PERFUSION IMAGING 11/25/2019  Interpretation Summary  There were transient T wave changes in the inferolateral leads during infusion that rapidly reolved in recovery  Nuclear stress EF: 58%.  The left ventricular ejection fraction is normal (55-65%).  The study is normal.  This is a low risk study.   ECHOCARDIOGRAM  ECHOCARDIOGRAM COMPLETE 09/28/2021  Narrative ECHOCARDIOGRAM REPORT    Patient Name:   Mallory Hayes Date of Exam: 09/28/2021 Medical Rec #:  [id number]    Height:       61.0 in Accession #:    [id number]   Weight:       107.2 lb Date of Birth:  Sep 10, 1945    BSA:          1.449 m Patient Age:    75 years     BP:           125/70 mmHg Patient Gender: F            HR:           50 bpm.  Exam Location:  Outpatient  Procedure: 2D Echo, Cardiac Doppler, Color Doppler and Limited Color Doppler  Indications:    I35.0 Nonrheumatic aortic (valve) stenosis  History:        Patient has prior history of Echocardiogram examinations, most recent 02/03/2018. CHF, Aortic Valve Disease; Risk Factors:Hypertension, Sleep Apnea, Dyslipidemia, Diabetes, CKD stage 3 and Non-Smoker. Patient denies chest pain, SOB and leg edema.  Sonographer:    Annabella Cater RVT, RDCS (AE),  RDMS Referring Phys: [phone] [facility]   Sonographer Comments: Suboptimal parasternal window. Tight rib spaces. IMPRESSIONS   1. GLS is borderline low. Left ventricular ejection fraction, by estimation, is 60 to 65%. The left ventricle has normal function. The left ventricle has no regional wall motion abnormalities. There is moderate left ventricular hypertrophy. Left ventricular diastolic parameters are indeterminate. The average left ventricular global longitudinal strain is -17.0 %. 2. Right ventricular systolic function is normal. The right ventricular size is normal. Tricuspid regurgitation signal is inadequate for assessing PA pressure. 3. The mitral valve is grossly normal. No evidence of mitral valve regurgitation. 4. Aortic valve regurgitation is not visualized. Aortic valve sclerosis/calcification is present, without any evidence of aortic stenosis. 5. The inferior vena cava is normal in size with greater than 50% respiratory variability, suggesting right atrial pressure of 3 mmHg.  Comparison(s): No prior Echocardiogram.  FINDINGS Left Ventricle: GLS is borderline low. Left ventricular ejection fraction, by estimation, is 60 to 65%. The left ventricle has normal function. The left ventricle has no regional wall motion abnormalities. The average left ventricular global longitudinal strain is -17.0 %. The left ventricular internal cavity size was normal in size. There is moderate left ventricular hypertrophy. Left ventricular diastolic parameters are indeterminate.  Right Ventricle: The right ventricular size is normal. Right ventricular systolic function is normal. Tricuspid regurgitation signal is inadequate for assessing PA pressure.  Left Atrium: Left atrial size was normal in size.  Right Atrium: Right atrial size was normal in size.  Pericardium: There is no evidence of pericardial effusion.  Mitral Valve: The mitral valve is grossly normal. There is mild  thickening of the mitral valve leaflet(s). No evidence of mitral valve regurgitation.  Tricuspid Valve: The tricuspid valve is normal in structure. Tricuspid valve regurgitation is not demonstrated.  Aortic Valve: Aortic valve regurgitation is not visualized. Aortic valve sclerosis/calcification is present, without any evidence of aortic stenosis. Aortic valve mean gradient measures 5.0 mmHg. Aortic valve peak gradient measures 11.6 mmHg. Aortic valve area, by VTI measures 1.36 cm.  Pulmonic Valve: The pulmonic valve was not well visualized. Pulmonic valve regurgitation is not visualized.  Aorta: The aortic root is normal in size and structure.  Venous: The inferior vena cava is normal in size with greater than 50% respiratory variability, suggesting right atrial pressure of 3 mmHg.  IAS/Shunts: No atrial level shunt detected by color flow Doppler.   LEFT VENTRICLE PLAX 2D LVOT diam:     1.60 cm     Diastology LV SV:         45          LV e' medial:    8.18 cm/s LV SV Index:   31          LV E/e' medial:  6.5 LVOT Area:     2.01 cm    LV e' lateral:   11.10 cm/s LV E/e' lateral: 4.8  LV Volumes (MOD)           2D Longitudinal Strain LV  vol d, MOD A2C: 32.6 ml 2D Strain GLS Avg:     -17.0 % LV vol d, MOD A4C: 42.9 ml LV vol s, MOD A2C: 7.6 ml LV vol s, MOD A4C: 15.5 ml LV SV MOD A2C:     25.0 ml 3D Volume EF: LV SV MOD A4C:     42.9 ml 3D EF:        76 % LV SV MOD BP:      27.8 ml LV EDV:       68 ml LV ESV:       16 ml LV SV:        52 ml  RIGHT VENTRICLE RV S prime:     11.30 cm/s TAPSE (M-mode): 2.5 cm  LEFT ATRIUM             Index        RIGHT ATRIUM           Index LA Vol (A2C):   24.8 ml 17.12 ml/m  RA Area:     10.10 cm LA Vol (A4C):   19.6 ml 13.53 ml/m  RA Volume:   20.60 ml  14.22 ml/m LA Biplane Vol: 23.0 ml 15.87 ml/m AORTIC VALVE                     PULMONIC VALVE AV Area (Vmax):    1.29 cm      PV Vmax:       0.97 m/s AV Area (Vmean):   1.36 cm       PV Peak grad:  3.8 mmHg AV Area (VTI):     1.36 cm AV Vmax:           170.00 cm/s AV Vmean:          103.000 cm/s AV VTI:            0.330 m AV Peak Grad:      11.6 mmHg AV Mean Grad:      5.0 mmHg LVOT Vmax:         109.00 cm/s LVOT Vmean:        69.800 cm/s LVOT VTI:          0.223 m LVOT/AV VTI ratio: 0.68  AORTA Ao Root diam: 2.50 cm  MITRAL VALVE MV Area (PHT): 2.16 cm    SHUNTS MV Decel Time: 352 msec    Systemic VTI:  0.22 m MV E velocity: 53.00 cm/s  Systemic Diam: 1.60 cm MV A velocity: 79.60 cm/s MV E/A ratio:  0.67  [place] signed by Ronal Ross Signature Date/Time: 09/28/2021/3:49:38 PM    Final    MONITORS  LONG TERM MONITOR (3-14 DAYS) 02/22/2020  Narrative 3-day ZIO monitor  Quality: Fair.  Baseline artifact. Predominant rhythm: Sinus bradycardia Average heart rate: 55 bpm Max heart rate: 139 bpm Min heart rate: 34 bpm Pauses >2.5 seconds: None  Occasional PVCs and PACs.  Patient did submit a symptom diary.  She pushed the button twice at which time sinus rhythm was noted.   Tiffany C. Raford, MD, [facility] 03/22/2020 4:53 PM       ______________________________________________________________________________________________          Risk Assessment/Calculations:             Physical Exam:   VS:  BP 118/64 (BP Location: Right Arm, Patient Position: Sitting, Cuff Size: Small)   Pulse (!) 50   Ht 5' 1 (1.549 m)   Wt 111 lb 14.4  oz (50.8 kg)   SpO2 98%   BMI 21.14 kg/m    Wt Readings from Last 3 Encounters:  02/21/24 111 lb 14.4 oz (50.8 kg)  01/28/24 110 lb (49.9 kg)  09/12/23 106 lb (48.1 kg)    GEN: Well nourished, well developed in no acute distress NECK: No JVD; No carotid bruits CARDIAC: regular rhythm, bradycardia, no murmurs, rubs, gallops; radial and PT pulses 2+ bilaterally RESPIRATORY:  Clear to auscultation without rales, wheezing or rhonchi  ABDOMEN: Soft, non-tender, non-distended EXTREMITIES:   No edema; No deformity   ASSESSMENT AND PLAN: .    Mild aortic stenosis - Echo 09/2021 normal LVEF 60-65%, no AS. No appreciable murmur on exam. Denies presyncope, syncope, lightheadedness, dizziness, shortness of breath.  Continue rosuvastatin  20mg  daily Will continue to follow clinically, consider repeat echo 2026  HTN, goal <130/80 - BP 132/68 in office, not monitored at home. On olmesartan -amlodipine -hydrochlorothiazide  40/10/25mg  every day continue same. Carvedilol  previously stopped due to bradycardia  OSA - unable to tolerate CPAP.   DM2 - 08/2023: A1c 8.0 (improved from >15); on metformin  500mg  BID, trulicity  once weekly, insulin  glarine 8U daily; follows with PCP  Bradycardia - monitor with SB, no significant pauses. Asymptomatic with no lightheadedness, dizziness. Avoid AV nodal blocking agents.   Family history of cardiovascular disease / HLD, LDL goal <70 - 03/2023: LDL 85. Stable with no anginal symptoms. No indication for ischemic evaluation.  Plans for updated lipid panel at upcoming PCP visit.      Dispo: follow up in 1 year  Signed, Reche GORMAN Finder, NP

## 2024-02-24 ENCOUNTER — Other Ambulatory Visit: Payer: Self-pay | Admitting: Family Medicine

## 2024-02-26 ENCOUNTER — Telehealth: Payer: Self-pay | Admitting: Family Medicine

## 2024-02-26 DIAGNOSIS — I129 Hypertensive chronic kidney disease with stage 1 through stage 4 chronic kidney disease, or unspecified chronic kidney disease: Secondary | ICD-10-CM | POA: Diagnosis not present

## 2024-02-26 DIAGNOSIS — N183 Chronic kidney disease, stage 3 unspecified: Secondary | ICD-10-CM | POA: Diagnosis not present

## 2024-02-26 DIAGNOSIS — E1122 Type 2 diabetes mellitus with diabetic chronic kidney disease: Secondary | ICD-10-CM | POA: Diagnosis not present

## 2024-02-26 NOTE — Telephone Encounter (Signed)
 LVM to call back to schedule

## 2024-02-26 NOTE — Telephone Encounter (Signed)
-----   Message from Brittany McIntyre sent at 02/25/2024 11:35 AM EDT ----- Can you call patient and ask her to schedule follow up visit for her diabetes? She's past due for an updated A1c.  Thanks! Laymon JINNY Legions, MD

## 2024-03-05 ENCOUNTER — Encounter: Payer: Self-pay | Admitting: Podiatry

## 2024-03-05 ENCOUNTER — Ambulatory Visit (INDEPENDENT_AMBULATORY_CARE_PROVIDER_SITE_OTHER): Admitting: Podiatry

## 2024-03-05 DIAGNOSIS — B351 Tinea unguium: Secondary | ICD-10-CM

## 2024-03-05 DIAGNOSIS — E1169 Type 2 diabetes mellitus with other specified complication: Secondary | ICD-10-CM | POA: Diagnosis not present

## 2024-03-05 DIAGNOSIS — M79675 Pain in left toe(s): Secondary | ICD-10-CM

## 2024-03-05 DIAGNOSIS — M79674 Pain in right toe(s): Secondary | ICD-10-CM

## 2024-03-05 DIAGNOSIS — N2889 Other specified disorders of kidney and ureter: Secondary | ICD-10-CM

## 2024-03-05 NOTE — Progress Notes (Signed)
 This patient returns to my office for at risk foot care.  This patient requires this care by a professional since this patient will be at risk due to having CKD stage 3 and type 2 diabetes.   This patient is unable to cut nails herself since the patient cannot reach her nails.These nails are painful walking and wearing shoes.  This patient presents for at risk foot care today.  General Appearance  Alert, conversant and in no acute stress.  Vascular  Dorsalis pedis and posterior tibial  pulses are palpable  bilaterally.  Capillary return is within normal limits  bilaterally. Temperature is within normal limits  bilaterally.  Neurologic  Senn-Weinstein monofilament wire test within normal limits  bilaterally. Muscle power within normal limits bilaterally.  Nails Thick disfigured discolored nails with subungual debris  from hallux to fifth toes bilaterally. No evidence of bacterial infection or drainage bilaterally.  Orthopedic  No limitations of motion  feet .  No crepitus or effusions noted.  No bony pathology or digital deformities noted.  HAV  B/L and tailors bunion  B/L.  Skin  normotropic skin with no porokeratosis noted bilaterally.  No signs of infections or ulcers noted.     Onychomycosis  Pain in right toes  Pain in left toes  Consent was obtained for treatment procedures.   Mechanical debridement of nails 1-5  bilaterally performed with a nail nipper.  Filed with dremel without incident.    Return office visit     3 months                 Told patient to return for periodic foot care and evaluation due to potential at risk complications.   Helane Gunther DPM

## 2024-03-10 DIAGNOSIS — R001 Bradycardia, unspecified: Secondary | ICD-10-CM | POA: Diagnosis not present

## 2024-03-10 DIAGNOSIS — I10 Essential (primary) hypertension: Secondary | ICD-10-CM

## 2024-03-19 DIAGNOSIS — E785 Hyperlipidemia, unspecified: Secondary | ICD-10-CM | POA: Diagnosis not present

## 2024-03-19 DIAGNOSIS — R001 Bradycardia, unspecified: Secondary | ICD-10-CM | POA: Diagnosis not present

## 2024-03-19 DIAGNOSIS — E119 Type 2 diabetes mellitus without complications: Secondary | ICD-10-CM | POA: Diagnosis not present

## 2024-03-19 DIAGNOSIS — G3184 Mild cognitive impairment, so stated: Secondary | ICD-10-CM | POA: Diagnosis not present

## 2024-03-19 DIAGNOSIS — Z79899 Other long term (current) drug therapy: Secondary | ICD-10-CM | POA: Diagnosis not present

## 2024-03-19 DIAGNOSIS — Z794 Long term (current) use of insulin: Secondary | ICD-10-CM | POA: Diagnosis not present

## 2024-03-19 DIAGNOSIS — J301 Allergic rhinitis due to pollen: Secondary | ICD-10-CM | POA: Diagnosis not present

## 2024-03-19 DIAGNOSIS — M8580 Other specified disorders of bone density and structure, unspecified site: Secondary | ICD-10-CM | POA: Diagnosis not present

## 2024-03-19 DIAGNOSIS — I10 Essential (primary) hypertension: Secondary | ICD-10-CM | POA: Diagnosis not present

## 2024-04-02 ENCOUNTER — Encounter: Payer: Self-pay | Admitting: Obstetrics and Gynecology

## 2024-04-02 ENCOUNTER — Ambulatory Visit: Admitting: Obstetrics and Gynecology

## 2024-04-02 VITALS — BP 120/70 | HR 56 | Ht 60.0 in | Wt 113.0 lb

## 2024-04-02 DIAGNOSIS — Z8041 Family history of malignant neoplasm of ovary: Secondary | ICD-10-CM | POA: Diagnosis not present

## 2024-04-02 DIAGNOSIS — Z01419 Encounter for gynecological examination (general) (routine) without abnormal findings: Secondary | ICD-10-CM

## 2024-04-02 DIAGNOSIS — M8580 Other specified disorders of bone density and structure, unspecified site: Secondary | ICD-10-CM | POA: Diagnosis not present

## 2024-04-02 DIAGNOSIS — R102 Pelvic and perineal pain unspecified side: Secondary | ICD-10-CM | POA: Diagnosis not present

## 2024-04-02 DIAGNOSIS — R351 Nocturia: Secondary | ICD-10-CM | POA: Diagnosis not present

## 2024-04-02 DIAGNOSIS — E2839 Other primary ovarian failure: Secondary | ICD-10-CM

## 2024-04-02 MED ORDER — ESTRADIOL 10 MCG VA TABS: 1.0000 | ORAL_TABLET | VAGINAL | 11 refills | Status: DC

## 2024-04-02 NOTE — Progress Notes (Signed)
 78 y.o. y.o. female here for annual medicare exam. No LMP recorded. Patient has had a hysterectomy.   Established patient  HPI:  S/P Total Hysterectomy with unilateral Oophorectomy for Uterine Fibroids.  No pelvic pain.  Abstinent.  Breasts normal.  Mother with Breast Ca.  Urine/BMs normal. Health labs with Fam MD.  BD AP Lumbar Spine T-Score -1.3 in 2016 2022 -2.3.  Colono 2023.  BMI 20.88. c/o nocturia. Has DM. MMG 11/07/23 Mother with ovarian cancer: patient would like PUS and ca125 Patient would like lipid panel today. To also get osteoporosis labs for treatment if needed.  Patient agreed.   Body mass index is 22.07 kg/m.     Blood pressure 120/70, pulse (!) 56, height 5' (1.524 m), weight 113 lb (51.3 kg), SpO2 98%.  No results found for: DIAGPAP, HPVHIGH, ADEQPAP  GYN HISTORY: No results found for: DIAGPAP, HPVHIGH, ADEQPAP  OB History  Gravida Para Term Preterm AB Living  0 0 0 0 0 0  SAB IAB Ectopic Multiple Live Births  0 0 0 0     Past Medical History:  Diagnosis Date   Diabetes mellitus without complication (HCC)    GERD (gastroesophageal reflux disease)    Hyperlipidemia    on meds   Hypertension    OSA (obstructive sleep apnea)    no cpap   Pure hypercholesterolemia 12/27/2021   Sleep apnea     Past Surgical History:  Procedure Laterality Date   APPENDECTOMY     BREAST EXCISIONAL BIOPSY Right    benign   CATARACT EXTRACTION     06/2022, 11/2022   CHOLECYSTECTOMY     COLONOSCOPY  2011   PARTIAL HYSTERECTOMY  1991   POLYPECTOMY      Current Outpatient Medications on File Prior to Visit  Medication Sig Dispense Refill   Alcohol  Swabs (DROPSAFE ALCOHOL  PREP) 70 % PADS USE THREE TIMES DAILY 300 each 3   aspirin 81 MG tablet Take 81 mg by mouth daily.     Blood Glucose Monitoring Suppl DEVI 1 each by Does not apply route in the morning, at noon, and at bedtime. May substitute to any manufacturer covered by patient's insurance.      Calcium  Carbonate Antacid (TUMS PO) Take 1 tablet by mouth daily as needed.     Continuous Glucose Receiver (FREESTYLE LIBRE 3 READER) DEVI USE AS DIRECTED 1 each 0   Continuous Glucose Sensor (FREESTYLE LIBRE 3 PLUS SENSOR) MISC Apply every 2 weeks to continually monitor glucose. 2 each 11   diclofenac  Sodium (VOLTAREN ) 1 % GEL Apply 2 g topically 4 (four) times daily. 50 g 0   fluticasone  (FLONASE ) 50 MCG/ACT nasal spray USE 2 SPRAYS IN EACH NOSTRIL EVERY DAY 48 g 3   glucose blood (TRUE METRIX BLOOD GLUCOSE TEST) test strip Use to check blood sugar three times daily 100 each 12   insulin  glargine-yfgn (SEMGLEE ) 100 UNIT/ML Pen Inject 8 Units into the skin daily. 15 mL 1   Insulin  Pen Needle 32G X 4 MM MISC As directed 100 each 11   LANTUS  SOLOSTAR 100 UNIT/ML Solostar Pen      metFORMIN  (GLUCOPHAGE -XR) 500 MG 24 hr tablet Take 1 tablet (500 mg total) by mouth 2 (two) times daily with a meal. 180 tablet 1   Olmesartan -amLODIPine -HCTZ 40-10-25 MG TABS TAKE 1 TABLET BY MOUTH EVERY DAY 90 tablet 1   Polyethylene Glycol 3350  (PEG 3350 ) 17 GM/SCOOP POWD Take 17g by mouth daily prn 850 g 1  rosuvastatin  (CRESTOR ) 20 MG tablet TAKE 1 TABLET EVERY DAY 90 tablet 3   TRUEplus Lancets 33G MISC TEST BLOOD SUGAR THREE TIMES DAILY 300 each 3   vitamin E 200 UNIT capsule Take by mouth daily.     Zoster Vaccine Adjuvanted (SHINGRIX ) injection Administer Shingrix  vaccination now and repeat in two months 1 each 1   No current facility-administered medications on file prior to visit.    Social History   Socioeconomic History   Marital status: Single    Spouse name: Not on file   Number of children: 0   Years of education: 12   Highest education level: High school graduate  Occupational History   Occupation: Retired  Tobacco Use   Smoking status: Never    Passive exposure: Never   Smokeless tobacco: Never  Vaping Use   Vaping status: Never Used  Substance and Sexual Activity   Alcohol  use: No    Drug use: Yes    Types: Oxycodone   Sexual activity: Not Currently    Partners: Male    Birth control/protection: Surgical    Comment: hysterectomy, less than 5 partners, over 16 at sexual debut, no hx br cancer, no DES,  Other Topics Concern   Not on file  Social History Narrative   Pt's 3 yo mother died 08/13/10 of breast cancer. Pt was very close with mother.  Has 2 siblings that live out of town, but has some cousins and friends in the [place] area.   Retired, never married   No h/o tob, EToH, or drug use   Enjoys walking, reading, dining out, and [employer]   Very active in her church    Social Drivers of Health   Financial Resource Strain: Low Risk  (08/05/2023)   Overall Financial Resource Strain (CARDIA)    Difficulty of Paying Living Expenses: Not hard at all  Food Insecurity: Food Insecurity Present (08/05/2023)   Hunger Vital Sign    Worried About Running Out of Food in the Last Year: Sometimes true    Ran Out of Food in the Last Year: Sometimes true  Transportation Needs: No Transportation Needs (08/05/2023)   PRAPARE - Administrator, Civil Service (Medical): No    Lack of Transportation (Non-Medical): No  Physical Activity: Sufficiently Active (08/05/2023)   Exercise Vital Sign    Days of Exercise per Week: 7 days    Minutes of Exercise per Session: 30 min  Stress: No Stress Concern Present (08/05/2023)   [employer] of Occupational Health - Occupational Stress Questionnaire    Feeling of Stress : Not at all  Social Connections: Moderately Integrated (08/05/2023)   Social Connection and Isolation Panel    Frequency of Communication with Friends and Family: More than three times a week    Frequency of Social Gatherings with Friends and Family: Once a week    Attends Religious Services: More than 4 times per year    Active Member of [employer] or Organizations: Yes    Attends Banker Meetings: More than 4 times per year    Marital Status: Never  married  Intimate Partner Violence: Not At Risk (08/05/2023)   Humiliation, Afraid, Rape, and Kick questionnaire    Fear of Current or Ex-Partner: No    Emotionally Abused: No    Physically Abused: No    Sexually Abused: No    Family History  Problem Relation Age of Onset   Colon cancer Mother 58   Ovarian cancer Mother  Heart disease Mother        required stent 3 yo   Other Father        brain tumor   Prostate cancer Brother    Hypertension Brother    Diabetes Brother    Thyroid  disease Maternal Grandmother    Heart attack Maternal Grandfather      Allergies  Allergen Reactions   Amaryl  [Glimepiride ] Anxiety   Food Itching    Spices, peppers; chocolate (nasal blockage)   Sulfamethoxazole Itching, Rash and Other (See Comments)    sneezing      Patient's last menstrual period was No LMP recorded. Patient has had a hysterectomy..             Review of Systems Alls systems reviewed and are negative.     Physical Exam Constitutional:      Appearance: Normal appearance.  Genitourinary:     Vulva normal.     No lesions in the vagina.     Genitourinary Comments: Could not appreciate the remaining ovary on exam     Right Labia: No rash, lesions or skin changes.    Left Labia: No lesions, skin changes or rash.    Vaginal cuff intact.    No vaginal discharge or tenderness.     No vaginal prolapse present.    Severe vaginal atrophy present.     Right Adnexa: not absent.    Left Adnexa: not absent.    Cervix is not absent.     Uterus is not absent. Breasts:    Right: Normal.     Left: Normal.  HENT:     Head: Normocephalic.  Neck:     Thyroid : No thyroid  mass, thyromegaly or thyroid  tenderness.  Cardiovascular:     Rate and Rhythm: Normal rate and regular rhythm.     Heart sounds: Normal heart sounds, S1 normal and S2 normal.  Pulmonary:     Effort: Pulmonary effort is normal.     Breath sounds: Normal breath sounds and air entry.  Abdominal:      General: Bowel sounds are normal. There is no distension.     Palpations: Abdomen is soft. There is no mass.     Tenderness: There is no abdominal tenderness. There is no guarding or rebound.  Musculoskeletal:     Cervical back: Full passive range of motion without pain, normal range of motion and neck supple. No tenderness.     Right lower leg: No edema.     Left lower leg: No edema.  Neurological:     Mental Status: She is alert.  Skin:    General: Skin is warm.  Psychiatric:        Mood and Affect: Mood normal.        Behavior: Behavior normal.        Thought Content: Thought content normal.  Vitals and nursing note reviewed. Exam conducted with a chaperone present.    Darice, CMA was present for the exam today    A:         Well Woman medicare GYN exam Borderline osteoporosis needs updated scan DM Nocturia Mother with h/o ovarian cancer. Feels pelvic pressure would like ca125 and pUS                             P:        Pap smear not indicated Encouraged annual mammogram screening Colon cancer screening up-to-date DXA ordered today  Labs and immunizations to do with PMD above labs listed- see orders Discussed breast self exams Encouraged healthy lifestyle practices Encouraged Vit D and Calcium    No follow-ups on file.  Mallory Hayes

## 2024-04-03 ENCOUNTER — Ambulatory Visit: Payer: Self-pay | Admitting: Obstetrics and Gynecology

## 2024-04-03 LAB — COMPREHENSIVE METABOLIC PANEL WITH GFR
AG Ratio: 1.2 (calc) (ref 1.0–2.5)
ALT: 16 U/L (ref 6–29)
AST: 20 U/L (ref 10–35)
Albumin: 4.3 g/dL (ref 3.6–5.1)
Alkaline phosphatase (APISO): 94 U/L (ref 37–153)
BUN/Creatinine Ratio: 22 (calc) (ref 6–22)
BUN: 34 mg/dL — ABNORMAL HIGH (ref 7–25)
CO2: 26 mmol/L (ref 20–32)
Calcium: 9.5 mg/dL (ref 8.6–10.4)
Chloride: 99 mmol/L (ref 98–110)
Creat: 1.55 mg/dL — ABNORMAL HIGH (ref 0.60–1.00)
Globulin: 3.5 g/dL (ref 1.9–3.7)
Glucose, Bld: 232 mg/dL — ABNORMAL HIGH (ref 65–99)
Potassium: 4.3 mmol/L (ref 3.5–5.3)
Sodium: 136 mmol/L (ref 135–146)
Total Bilirubin: 0.3 mg/dL (ref 0.2–1.2)
Total Protein: 7.8 g/dL (ref 6.1–8.1)
eGFR: 34 mL/min/1.73m2 — ABNORMAL LOW

## 2024-04-03 LAB — LIPID PANEL
Cholesterol: 156 mg/dL
HDL: 77 mg/dL
LDL Cholesterol (Calc): 63 mg/dL
Non-HDL Cholesterol (Calc): 79 mg/dL
Total CHOL/HDL Ratio: 2 (calc)
Triglycerides: 79 mg/dL

## 2024-04-03 LAB — VITAMIN D 25 HYDROXY (VIT D DEFICIENCY, FRACTURES): Vit D, 25-Hydroxy: 16 ng/mL — ABNORMAL LOW (ref 30–100)

## 2024-04-03 LAB — CA 125: CA 125: 7 U/mL (ref <35)

## 2024-04-07 ENCOUNTER — Other Ambulatory Visit

## 2024-04-13 ENCOUNTER — Other Ambulatory Visit: Payer: Self-pay | Admitting: Family Medicine

## 2024-04-13 DIAGNOSIS — E1169 Type 2 diabetes mellitus with other specified complication: Secondary | ICD-10-CM

## 2024-04-13 NOTE — Telephone Encounter (Signed)
 Medication appears to have been discontinued previously Patient not seen in 7 months for her diabetes Recommend she schedule appointment before I will refill  Thanks, Laymon JINNY Legions, MD

## 2024-04-14 ENCOUNTER — Other Ambulatory Visit

## 2024-04-14 DIAGNOSIS — Z8041 Family history of malignant neoplasm of ovary: Secondary | ICD-10-CM | POA: Diagnosis not present

## 2024-04-14 DIAGNOSIS — R102 Pelvic and perineal pain unspecified side: Secondary | ICD-10-CM | POA: Diagnosis not present

## 2024-04-14 DIAGNOSIS — Z01419 Encounter for gynecological examination (general) (routine) without abnormal findings: Secondary | ICD-10-CM

## 2024-04-21 ENCOUNTER — Ambulatory Visit: Admitting: Family Medicine

## 2024-05-04 ENCOUNTER — Other Ambulatory Visit (HOSPITAL_COMMUNITY): Payer: Self-pay

## 2024-05-04 ENCOUNTER — Telehealth: Payer: Self-pay

## 2024-05-04 ENCOUNTER — Telehealth: Payer: Self-pay | Admitting: *Deleted

## 2024-05-04 NOTE — Telephone Encounter (Signed)
 Spoke with patient. Patient states she is waiting on Rx for her bladder supposed to be sent at her last OV 04/02/24.   Advised Rx for vaginal estrogen sent on 04/02/24 to [facility]. Patient states nothing was available for pick up. Call placed to [facility], spoke with Jacqueline. Was advised estradiol  vag tab not on formulary. OOP cost for yuvafem  $224 for 90 day supply. Vagifem  not covered.   Covered alternatives are premarin  vaginal cream and estring .   Advised patient as seen above. Patient states she has concerns about estrogen medications, and concerns with cancer.  Recommended OV to further discuss with Dr. Glennon to address concerns and alternative, patient declines right now. Would like Dr. Glennon to provide alternative based on recommendations.    2. Patient states she has a mole on right leg discussed during her OV, was uncertain of next steps? Patient states she is established with dermatology. Instructed patient to contact dermatology for appt for evaluation. Contact our office if any additional assistance needed. Patient agreeable.   Dr. Glennon -please advise on vaginal estrogen alternative.

## 2024-05-04 NOTE — Telephone Encounter (Signed)
 In process of mailing [employer] re-enrollment application for Trulicity  assistance.

## 2024-05-05 MED ORDER — PREMARIN 0.625 MG/GM VA CREA: TOPICAL_CREAM | VAGINAL | 3 refills | Status: AC

## 2024-05-05 NOTE — Telephone Encounter (Signed)
 Spoke with patient, advised per Dr. Glennon. Patient would like to try Premarin  vaginal cream, Rx sent to confirmed pharmacy.   Estradiol  vag tab d/c.   Patient read back instructions. Patient aware to call if any additional questions/concerns.   Encounter closed.

## 2024-05-05 NOTE — Telephone Encounter (Signed)
 PAP: Patient assistance application for Trulicity  through [employer] has been mailed to pt's home address on file.   Page 8 (provider page) placed in pcp box for review/ signature.

## 2024-05-08 ENCOUNTER — Other Ambulatory Visit: Payer: Self-pay | Admitting: Family Medicine

## 2024-05-08 NOTE — Telephone Encounter (Signed)
Please let patient know I am refilling this medication, but she needs to schedule an appointment with me.   Thanks, [name], MD

## 2024-05-12 NOTE — Telephone Encounter (Signed)
 Patient called to let me know she stopped taking Trulicity  in June and no longer needs assistance.   Will shred application.

## 2024-05-19 ENCOUNTER — Ambulatory Visit: Admitting: Podiatry

## 2024-05-19 ENCOUNTER — Encounter: Payer: Self-pay | Admitting: Podiatry

## 2024-05-19 DIAGNOSIS — M79674 Pain in right toe(s): Secondary | ICD-10-CM | POA: Diagnosis not present

## 2024-05-19 DIAGNOSIS — E1169 Type 2 diabetes mellitus with other specified complication: Secondary | ICD-10-CM

## 2024-05-19 DIAGNOSIS — B351 Tinea unguium: Secondary | ICD-10-CM

## 2024-05-19 DIAGNOSIS — N2889 Other specified disorders of kidney and ureter: Secondary | ICD-10-CM

## 2024-05-19 DIAGNOSIS — M79675 Pain in left toe(s): Secondary | ICD-10-CM

## 2024-05-19 NOTE — Progress Notes (Signed)
 This patient returns to my office for at risk foot care.  This patient requires this care by a professional since this patient will be at risk due to having CKD stage 3 and type 2 diabetes.   This patient is unable to cut nails herself since the patient cannot reach her nails.These nails are painful walking and wearing shoes.  This patient presents for at risk foot care today.  General Appearance  Alert, conversant and in no acute stress.  Vascular  Dorsalis pedis and posterior tibial  pulses are palpable  bilaterally.  Capillary return is within normal limits  bilaterally. Temperature is within normal limits  bilaterally.  Neurologic  Senn-Weinstein monofilament wire test within normal limits  bilaterally. Muscle power within normal limits bilaterally.  Nails Thick disfigured discolored nails with subungual debris  from hallux to fifth toes bilaterally. No evidence of bacterial infection or drainage bilaterally.  Orthopedic  No limitations of motion  feet .  No crepitus or effusions noted.  No bony pathology or digital deformities noted.  HAV  B/L and tailors bunion  B/L.  Skin  normotropic skin with no porokeratosis noted bilaterally.  No signs of infections or ulcers noted.     Onychomycosis  Pain in right toes  Pain in left toes  Consent was obtained for treatment procedures.   Mechanical debridement of nails 1-5  bilaterally performed with a nail nipper.  Filed with dremel without incident.    Return office visit     10 months              Told patient to return for periodic foot care and evaluation due to potential at risk complications.   Cordella Bold DPM

## 2024-07-29 ENCOUNTER — Ambulatory Visit: Admitting: Podiatry

## 2024-08-06 ENCOUNTER — Encounter
